# Patient Record
Sex: Male | Born: 1935 | Race: White | Hispanic: No | Marital: Married | Smoking: Never smoker
Health system: Southern US, Community
[De-identification: ages and names within clinical notes are randomized; demographics above are authoritative.]

## PROBLEM LIST (undated history)

## (undated) DIAGNOSIS — C61 Malignant neoplasm of prostate: Secondary | ICD-10-CM

## (undated) DIAGNOSIS — K5790 Diverticulosis of intestine, part unspecified, without perforation or abscess without bleeding: Secondary | ICD-10-CM

## (undated) DIAGNOSIS — R569 Unspecified convulsions: Secondary | ICD-10-CM

## (undated) DIAGNOSIS — K635 Polyp of colon: Secondary | ICD-10-CM

## (undated) HISTORY — PX: KNEE SURGERY: SHX244

## (undated) HISTORY — DX: Malignant neoplasm of prostate: C61

## (undated) HISTORY — DX: Polyp of colon: K63.5

## (undated) HISTORY — DX: Unspecified convulsions: R56.9

## (undated) HISTORY — PX: INGUINAL HERNIA REPAIR: SUR1180

## (undated) HISTORY — DX: Diverticulosis of intestine, part unspecified, without perforation or abscess without bleeding: K57.90

## (undated) HISTORY — PX: PROSTATE BIOPSY: SHX241

## (undated) HISTORY — PX: OTHER SURGICAL HISTORY: SHX169

---

## 2001-11-28 ENCOUNTER — Ambulatory Visit (HOSPITAL_COMMUNITY): Admission: RE | Admit: 2001-11-28 | Discharge: 2001-11-28 | Payer: Self-pay | Admitting: Internal Medicine

## 2002-02-03 ENCOUNTER — Encounter: Payer: Self-pay | Admitting: Gastroenterology

## 2002-02-06 ENCOUNTER — Encounter: Payer: Self-pay | Admitting: Gastroenterology

## 2004-07-07 ENCOUNTER — Emergency Department (HOSPITAL_COMMUNITY): Admission: EM | Admit: 2004-07-07 | Discharge: 2004-07-07 | Payer: Self-pay | Admitting: Family Medicine

## 2004-07-14 ENCOUNTER — Emergency Department (HOSPITAL_COMMUNITY): Admission: EM | Admit: 2004-07-14 | Discharge: 2004-07-14 | Payer: Self-pay | Admitting: Family Medicine

## 2007-12-30 ENCOUNTER — Ambulatory Visit (HOSPITAL_COMMUNITY): Admission: RE | Admit: 2007-12-30 | Discharge: 2007-12-30 | Payer: Self-pay | Admitting: Ophthalmology

## 2008-12-27 ENCOUNTER — Encounter (INDEPENDENT_AMBULATORY_CARE_PROVIDER_SITE_OTHER): Payer: Self-pay | Admitting: *Deleted

## 2009-01-07 ENCOUNTER — Encounter: Admission: RE | Admit: 2009-01-07 | Discharge: 2009-01-07 | Payer: Self-pay | Admitting: Internal Medicine

## 2009-07-14 ENCOUNTER — Telehealth: Payer: Self-pay | Admitting: Gastroenterology

## 2010-06-05 ENCOUNTER — Telehealth: Payer: Self-pay | Admitting: Gastroenterology

## 2010-06-06 ENCOUNTER — Ambulatory Visit
Admission: RE | Admit: 2010-06-06 | Discharge: 2010-06-06 | Payer: Self-pay | Source: Home / Self Care | Attending: Gastroenterology | Admitting: Gastroenterology

## 2010-06-06 ENCOUNTER — Encounter: Payer: Self-pay | Admitting: Gastroenterology

## 2010-06-06 ENCOUNTER — Other Ambulatory Visit: Payer: Self-pay | Admitting: Physician Assistant

## 2010-06-06 LAB — CBC WITH DIFFERENTIAL/PLATELET
Basophils Absolute: 0.1 10*3/uL (ref 0.0–0.1)
Basophils Relative: 0.9 % (ref 0.0–3.0)
Eosinophils Absolute: 0.2 10*3/uL (ref 0.0–0.7)
Hemoglobin: 13.6 g/dL (ref 13.0–17.0)
Lymphocytes Relative: 33.8 % (ref 12.0–46.0)
MCHC: 34.2 g/dL (ref 30.0–36.0)
Monocytes Relative: 14.6 % — ABNORMAL HIGH (ref 3.0–12.0)
Neutro Abs: 2.8 10*3/uL (ref 1.4–7.7)
Neutrophils Relative %: 48.1 % (ref 43.0–77.0)
RBC: 4.11 Mil/uL — ABNORMAL LOW (ref 4.22–5.81)
RDW: 12.9 % (ref 11.5–14.6)

## 2010-06-06 LAB — IBC PANEL
Saturation Ratios: 41.6 % (ref 20.0–50.0)
Transferrin: 245.3 mg/dL (ref 212.0–360.0)

## 2010-06-06 LAB — IRON: Iron: 143 ug/dL (ref 42–165)

## 2010-06-07 ENCOUNTER — Ambulatory Visit
Admission: RE | Admit: 2010-06-07 | Discharge: 2010-06-07 | Payer: Self-pay | Source: Home / Self Care | Attending: Gastroenterology | Admitting: Gastroenterology

## 2010-06-07 ENCOUNTER — Encounter: Payer: Self-pay | Admitting: Gastroenterology

## 2010-06-15 NOTE — Letter (Signed)
Summary: [facility] Instructions  [facility]  [address] [place], [place] [address]   Phone: [phone]  Fax: [phone]       LEONCIO HANSEN    December 23, 1930    MRN: [id number]        Procedure Day /Date: 06-07-2010      Arrival Time: 7:30 AM      Procedure Time: 8:00 AM     Location of Procedure:                    X     [facility] (4th Floor)   PREPARATION FOR COLONOSCOPY WITH MOVIPREP     THE DAY BEFORE YOUR PROCEDURE         DATE: 06-06-2010   DAY: Tuesday  1.  Drink clear liquids the entire day-NO SOLID FOOD  2.  Do not drink anything colored red or purple.  Avoid juices with pulp.  No orange juice.  3.  Drink at least 64 oz. (8 glasses) of fluid/clear liquids during the day to prevent dehydration and help the prep work efficiently.  CLEAR LIQUIDS INCLUDE: Water Jello Ice Popsicles Tea (sugar ok, no milk/cream) Powdered fruit flavored drinks Coffee (sugar ok, no milk/cream) Gatorade Juice: apple, white grape, white cranberry  Lemonade Clear bullion, consomm, broth Carbonated beverages (any kind) Strained chicken noodle soup Hard Candy                             4.  In the morning, mix first dose of MoviPrep solution:    Empty 1 Pouch A and 1 Pouch B into the disposable container    Add lukewarm drinking water to the top line of the container. Mix to dissolve    Refrigerate (mixed solution should be used within 24 hrs)  5.  Begin drinking the prep at 5:00 p.m. The MoviPrep container is divided by 4 marks.   Every 15 minutes drink the solution down to the next mark (approximately 8 oz) until the full liter is complete.   6.  Follow completed prep with 16 oz of clear liquid of your choice (Nothing red or purple).  Continue to drink clear liquids until bedtime.  7.  Before going to bed, mix second dose of MoviPrep solution:    Empty 1 Pouch A and 1 Pouch B into the disposable container    Add lukewarm drinking water  to the top line of the container. Mix to dissolve    Refrigerate  THE DAY OF YOUR PROCEDURE      DATE: 06-07-2010  DAY: Wednesday  Beginning at 3:00 AM  (5 hours before procedure):         1. Every 15 minutes, drink the solution down to the next mark (approx 8 oz) until the full liter is complete.  2. Follow completed prep with 16 oz. of clear liquid of your choice.    3. You may drink clear liquids until 6:00 AM  (2 HOURS BEFORE PROCEDURE).   MEDICATION INSTRUCTIONS  Unless otherwise instructed, you should take regular prescription medications with a small sip of water   as early as possible the morning of your procedure.  Do not take the Aspirin 81 mg tomorrow 06-07-2010.          OTHER INSTRUCTIONS  You will need a responsible adult at least 75 years of age to accompany you and drive you home.   This person must  remain in the waiting room during your procedure.  Wear loose fitting clothing that is easily removed.  Leave jewelry and other valuables at home.  However, you may wish to bring a book to read or  an iPod/MP3 player to listen to music as you wait for your procedure to start.  Remove all body piercing jewelry and leave at home.  Total time from sign-in until discharge is approximately 2-3 hours.  You should go home directly after your procedure and rest.  You can resume normal activities the  day after your procedure.  The day of your procedure you should not:   Drive   Make legal decisions   Operate machinery   Drink alcohol   Return to work  You will receive specific instructions about eating, activities and medications before you leave.    The above instructions have been reviewed and explained to me by   _______________________    I fully understand and can verbalize these instructions _____________________________ Date _________

## 2010-06-15 NOTE — Progress Notes (Signed)
Summary: Schedule Colonoscopy  Phone Note Outgoing Call Call back at [facility] Phone [phone]   Call placed by: Harlow Mares CMA Duncan Dull),  July 14, 2009 2:38 PM Call placed to: Patient Summary of Call: I spoke to the patients wife and explained to her how important it is for her husband to have his colonoscopy. she will have him call back to schedule.  Initial call taken by: Harlow Mares CMA (AAMA),  July 14, 2009 2:50 PM

## 2010-06-15 NOTE — Progress Notes (Signed)
Summary: triage sooner appt  Phone Note From Other Clinic Call back at [phone]   Caller: Lew Dawes from Dr Tori Milks Call For: Dr Arlyce Dice Reason for Call: Schedule Patient Appt Summary of Call: Dr Neva Seat wants this patient seen for rectal bleeding can't wait until first available is 2-28 Initial call taken by: Tawni Levy,  June 05, 2010 2:45 PM  Follow-up for Phone Call        Spoke with Lew Dawes and scheduled patient to see Mike Gip, PA 06/06/10 @10am .  Follow-up by: Selinda Michaels RN,  June 05, 2010 3:05 PM

## 2010-06-15 NOTE — Procedures (Signed)
Summary: Colonoscopy  Patient: Kenneth Bowen Note: All result statuses are Final unless otherwise noted.  Tests: (1) Colonoscopy (COL)   COL Colonoscopy           DONE     [facility]     [address]     [place], [place]  [address]           COLONOSCOPY PROCEDURE REPORT           PATIENT:  Kenneth Bowen, Kenneth Bowen  MR#:  [id number]     BIRTHDATE:  06-21-1930, 79 yrs. old  GENDER:  male           ENDOSCOPIST:  Barbette Hair. Arlyce Dice, MD     Referred by:  Nila Nephew, M.D.           PROCEDURE DATE:  06/07/2010     PROCEDURE:  Diagnostic Colonoscopy     ASA CLASS:  Class II     INDICATIONS:  1) rectal bleeding           MEDICATIONS:   Fentanyl 50 mcg IV, Versed 5 mg IV           DESCRIPTION OF PROCEDURE:   After the risks benefits and     alternatives of the procedure were thoroughly explained, informed     consent was obtained.  Digital rectal exam was performed and     revealed no abnormalities.   The LB [id number] endoscope     was introduced through the anus and advanced to the cecum, which     was identified by both the appendix and ileocecal valve, without     limitations.  The quality of the prep was good, using MoviPrep.     The instrument was then slowly withdrawn as the colon was fully     examined.     <<PROCEDUREIMAGES>>           FINDINGS:  Mild diverticulosis was found in the descending colon     (see image13).  No old or fresh blood.  This was otherwise a     normal examination of the colon (see image1, image2, image3,     image5, image6, image7, image10, image14, and image16).     Retroflexed views in the rectum revealed no abnormalities.    The     time to cecum =  4.0  minutes. The scope was then withdrawn (time     =  7.50  min) from the patient and the procedure completed.           COMPLICATIONS:  None           ENDOSCOPIC IMPRESSION:     1) Mild diverticulosis in the descending colon     2) Otherwise normal examination           Limited  rectal bleeding secondary to diverticular bleeding or     hemorrhoids.     No further bleeding           RECOMMENDATIONS:     1) Return to the care of your primary provider. GI follow up as     needed           REPEAT EXAM:  No           ______________________________     Barbette Hair. Arlyce Dice, MD           CC:           n.     eSIGNED:  Barbette Hair. Kaplan at 06/07/2010 08:34 AM           Page 2 of 3   [place], [place], [phone]  Note: An exclamation mark (!) indicates a result that was not dispersed into the flowsheet. Document Creation Date: 06/07/2010 8:34 AM _______________________________________________________________________  (1) Order result status: Final Collection or observation date-time: 06/07/2010 08:26 Requested date-time:  Receipt date-time:  Reported date-time:  Referring Physician:   Ordering Physician: Melvia Heaps [phone]) Specimen Source:  Source: Launa Grill Order Number: [id number] Lab site:

## 2010-06-15 NOTE — Assessment & Plan Note (Signed)
Summary: Rectal bleeding/LRH   History of Present Illness Primary GI MD: Sheryn Bison MD FACP FAGA Requesting Provider: Nila Nephew History of Present Illness:   VERY NICE 77 YO MALE KNOWN TO DR. KAPLAN FROM PRIOR COLONOSCOPY IN 2003. THIS SHOWED DESCENDING COLON DIVERICULOSIS, AND ONE SMALL POLYP (HYPERPLASTIC) PT COMES IN TODAY IN REFERRAL  FROM DR. ED GREEN, AFTER ONSET 5 DAYS AGO WITH PAINLESS RECTAL BLEEDING. HE HAS BEEN HAVING ONE BM/DAY WHICH APPEARS TO HAVE BLOOD IN IT AND COLORS THE WATER  RED. HE HAS NOT HAD ANY ABDOMINAL PAIN, CRAMPING. NO NAUSEA,ANOREXIA.NO WEAKNESS ,LIGHTHEADEDNESS ETC. NO RECTAL PAIN OR HX OF HEMORRHOID SXS.  THIS AM,AFTER HAVING A BM HE ALSO NOTICED SOME DRIPPING OF BLOOD IN THE COMMODE.  HE WAS SEEN BY DR. GREEN YESTERDAY, REPORTS HE HAD AN ANOSCOPY WITH BLOOD PRESENT, NO DEFINITE HEMORRHOIDS. HE ALSO RELATES HX OF IRON DEFICIENCY A YEAR OR SO AGO-TOOK IRON AND COUNTS IMPROVED. HE NOW TAKES IRON A COUPLE TIMES PER WEEK.HE IS ON A BABY ASA DAILY, NO OTHER BLOOD THINNERS.   GI Review of Systems    Reports weight loss.   Weight loss of 3 pounds   Denies abdominal pain, acid reflux, belching, bloating, chest pain, dysphagia with liquids, dysphagia with solids, heartburn, loss of appetite, nausea, vomiting, vomiting blood, and  weight gain.      Reports rectal bleeding.     Denies anal fissure, black tarry stools, change in bowel habit, constipation, diarrhea, diverticulosis, fecal incontinence, heme positive stool, hemorrhoids, irritable bowel syndrome, jaundice, light color stool, liver problems, and  rectal pain. Preventive Screening-Counseling & Management  Alcohol-Tobacco     Smoking Status: never    Current Medications (verified): 1)  Alprazolam 0.25 Mg Tabs (Alprazolam) .... 1/2 Tab Before Bedtime 2)  Allergy 4 Mg Tabs (Chlorpheniramine Maleate) .... Take 1 Daily 3)  Fish Oil 500 Mg Caps (Omega-3 Fatty Acids) .... Take 1 Tab Daily 4)   Multivitamins  Tabs (Multiple Vitamin) .... Take 1 Daily 5)  Vitamin C 500 Mg Chew (Ascorbic Acid) .... Take 1 Daily 6)  Vitamin D 400 Unit Caps (Cholecalciferol) .... Take 1 Daily 7)  Iron Supplement 325 (65 Fe) Mg Tabs (Ferrous Sulfate) .... Take  3 Every Week, Every Other Day 8)  Aspirin 81 Mg Tbec (Aspirin) .... Take 1 Tab Daily  Allergies (verified): No Known Drug Allergies   Past History:  Past Medical History: OSTEOARTHRITIS DIVERTICULOSIS HX /COLON POLYP 2003  Past Surgical History: Hernia surgery- 1993  Family History: Pancreatic cancer Maternal Aunt Stomach cancer- Maternal Grandfather Irratable Bowel Syndrome- Mother  Social History: Furniture conservator/restorer Occupation:  Patient has never smoked.  Alcohol Use - yes- 1 drink j sometimes daily Daily Caffeine Use- 2 cups of coffee Smoking Status:  never  Review of Systems       The patient complains of arthritis/joint pain.         SEE HPI  Vital Signs:  Patient profile:   77 year old male Height:      71 inches Weight:      189.25 pounds BMI:     26.49 Pulse rate:   60 / minute Pulse rhythm:   regular BP sitting:   114 / 62  (left arm)  Vitals Entered By: Lowry Ram NCMA (June 06, 2010 10:07 AM)  Physical Exam  General:  Well developed, well nourished, no acute distress. Head:  Normocephalic and atraumatic. Eyes:  PERRLA, no icterus. Lungs:  Clear throughout to auscultation. Heart:  Regular rate and rhythm; no murmurs, rubs,  or bruits. Abdomen:  SOFT, NONTENDER,NO MASS OR HSM,BS+ Rectal:  NOT REPEATED TODAY Extremities:  No clubbing, cyanosis, edema or deformities noted. Neurologic:  Alert and  oriented x4;  grossly normal neurologically. Psych:  Alert and cooperative. Normal mood and affect.   Impression & Recommendations:  Problem # 1:  RECTAL BLEEDING (ICD-569.3) Assessment New 77 YO MALE  WITH  5 DAY HX OF PAINLESS RECTAL BLEEDING /WITH BLOOD IN BM'S. HX OF DIVERTICULOSIS AND TINY  HYPERPLASTIC POLYP 2003. R/O LOW GRADE DIVERTICULAR BLEEDING, INTERNAL HEMORRHOIDAL,VS. OCCULT LESION.  CBC ,FE TODAY SCHEDULE FOR COLONOSCOPY WITH DR. KAPLAN ASAP-PROCEDURE REVIEWED IN DETAIL  WITH PT.  (SCHEDULED FOR TOMORROW) HOLD  BABY ASA. Orders: TLB-CBC Platelet - w/Differential (85025-CBCD) TLB-IBC Pnl (Iron/FE;Transferrin) (83550-IBC) TLB-Iron, (Fe) Total (83540-FE) Colonoscopy (Colon)  Problem # 2:  DIVERTICULOSIS OF COLON (ICD-562.10) Assessment: Comment Only  Orders: Colonoscopy (Colon)  Problem # 3:  PERSONAL HISTORY OF COLONIC POLYPS (ICD-V12.72) Assessment: Comment Only HYPERPLASTIC 2003 Orders: Colonoscopy (Colon)  Problem # 4:  ANEMIA Assessment: Unchanged PT WITH HX OF IRON DEFICIENCY ,ETIOLOGY UNCLEAR  WILL CHECK FE STUDIES TODAY  Patient Instructions: 1)  Please go to lab, basement level. 2)  We scheduled the Colonoscopy with Dr. Arlyce Dice on Wed 06-07-2010. 3)  Directions and brochure provided. 4)  [facility] Patient Information Guide given to patient. 5)  We sent the perscription for the colonoscopy prep you will be drinking to [facility] [address] 6)  Copy sent to : Dr Nila Nephew 7)  The medication list was reviewed and reconciled.  All changed / newly prescribed medications were explained.  A complete medication list was provided to the patient / caregiver. Prescriptions: MOVIPREP 100 GM  SOLR (PEG-KCL-NACL-NASULF-NA ASC-C) As per prep instructions.  #1 x 0   Entered by:   Lowry Ram NCMA   Authorized by:   Sammuel Cooper PA-c   Signed by:   Lowry Ram NCMA on 06/06/2010   Method used:   Electronically to        [facility] [phone][facility] (retail)       [address]       [place], [place]  [address]       Ph: [phone] or [phone]       Fax: [phone]   RxID:   [id number]

## 2010-06-15 NOTE — Procedures (Signed)
Summary: [facility] COLON   Colonoscopy  Procedure date:  02/03/2002  Findings:      Location:  [facility].   Patient Name: Kenneth Bowen, Scheffler MRN:  Procedure Procedures: Colonoscopy CPT: [phone].    with Hot Biopsy(s)CPT: [id number].  Personnel: Endoscopist: Barbette Hair. Arlyce Dice, MD.  Referred By: Erskine Speed, MD.  Indications  Surveillance of: Adenomatous Polyp(s).  Comments: Polyp seen on screening sigmoidoscopy History  Pre-Exam Physical: Performed Feb 06, 2002. Entire physical exam was normal.  Exam Exam: Extent of exam reached: Cecum, extent intended: Cecum.  The cecum was identified by IC valve. Colon retroflexion performed. ASA Classification: II. Tolerance: good.  Monitoring: Pulse and BP monitoring, Oximetry used. Supplemental O2 given. at 2 Liters.  Colon Prep Used Golytely for colon prep. Prep results: excellent.  Sedation Meds: Fentanyl 100 mcg. given IV. Versed 5 mg. given IV.  Findings - DIVERTICULOSIS: Descending Colon to Sigmoid Colon. ICD9: Diverticulosis: 562.10.  POLYP: Sigmoid Colon, Maximum size: 2 mm. Distance from Anus 25 cm. ICD9: Colon Polyps: 211.3.   Assessment Abnormal examination, see findings above.  Diagnoses: 211.3: Colon Polyps.  562.10: Diverticulosis.   Events  Unplanned Interventions: No intervention was required.  Unplanned Events: There were no complications. Plans Patient Education: Patient given standard instructions for: Polyps. Diverticulosis.  Scheduling/Referral: Colonoscopy, to Barbette Hair. Arlyce Dice, MD, If adenoma, around Feb 06, 2005.    cc: Erskine Speed, MD  This report was created from the original endoscopy report, which was reviewed and signed by the above listed endoscopist.

## 2010-09-26 NOTE — Op Note (Signed)
Kenneth Bowen, REIHL             ACCOUNT NO.:  [id number]   MEDICAL RECORD NO.:  [id number]          PATIENT TYPE:  AMB   LOCATION:  SDS                          FACILITY:  [facility]   PHYSICIAN:  Robert L. Dione Booze, M.D.  DATE OF BIRTH:  15-Oct-1930   DATE OF PROCEDURE:  DATE OF DISCHARGE:  12/30/2007                               OPERATIVE REPORT   INDICATIONS AND JUSTIFICATION FOR THE PROCEDURE:  Mr. Kutch is  followed regularly by Dr. Elise Benne and was seen on December 02, 2007,  regarding the possibility of upper eyelid blepharoplasties.  Basically,  he has extremely redundant skin of each upper eyelid and his acuity is  20/25.  There is an enormous reduction in the superior field, but the  skin is taped compared to when it is not taped and photographs confirmed  the condition.  The rest of this examination is fairly unremarkable.  The procedure was discussed with him regarding the difference in ptosis  and dermatochalasis and eyebrow ptosis.  He understands that removing  the skin from each upper eyelid would not necessarily correct all of  these problems, but it is hoped that this would open up things for him  and should be fairly simple to accomplish.  Various risks were  discussed.   Justification for performed procedure in outpatient setting is to have  changed if justification was not sustained, none.   PREOPERATIVE DIAGNOSIS:  Severe dermatochalasis with visual impairment.   POSTOPERATIVE DIAGNOSIS:  Severe dermatochalasis with visual impairment.   OPERATION PERFORMED:  Upper eyelid blepharoplasty.   SURGEON:  Robert L. Groat, MD   ANESTHESIA:  Xylocaine 1% with epinephrine.   PROCEDURE:  The patient arrived to the minor surgery room and was  prepped and draped in the routine fashion.  The skin to be removed was  carefully demarcated and 1% Xylocaine with epinephrine was given as a  local anesthetic.  The skin was then carefully excised along with some  underlying  subcutaneous tissue, and each wound was closed with a running  6-0 nylon suture.  Cold compresses were applied, and the patient left  the operating room having done very nicely.   FOLLOWUP CARE:  The patient to be seen in my office in about 6 days to  have the sutures removed.  He is to keep his head elevated today and use  cold compresses and will start warm compresses tomorrow.  He is to use  Polysporin ointment once daily.           ______________________________  Doris Cheadle Dione Booze, M.D.     RLG/MEDQ  D:  12/30/2007  T:  12/31/2007  Job:  [id number]   cc:   Erskine Speed, M.D.  Vincenza Hews, M.D.

## 2010-10-05 ENCOUNTER — Other Ambulatory Visit: Payer: Self-pay | Admitting: Internal Medicine

## 2010-10-05 ENCOUNTER — Ambulatory Visit
Admission: RE | Admit: 2010-10-05 | Discharge: 2010-10-05 | Disposition: A | Discharge status: Home / Self Care | Payer: Medicare Other | Source: Ambulatory Visit | Attending: Internal Medicine | Admitting: Internal Medicine

## 2010-10-05 DIAGNOSIS — R05 Cough: Secondary | ICD-10-CM

## 2011-10-16 ENCOUNTER — Ambulatory Visit (HOSPITAL_COMMUNITY)
Admission: RE | Admit: 2011-10-16 | Discharge: 2011-10-16 | Disposition: A | Discharge status: Home / Self Care | Payer: Medicare Other | Source: Ambulatory Visit | Attending: Internal Medicine | Admitting: Internal Medicine

## 2011-10-16 DIAGNOSIS — H538 Other visual disturbances: Secondary | ICD-10-CM | POA: Insufficient documentation

## 2011-10-16 DIAGNOSIS — R4789 Other speech disturbances: Secondary | ICD-10-CM | POA: Insufficient documentation

## 2011-10-16 DIAGNOSIS — G459 Transient cerebral ischemic attack, unspecified: Secondary | ICD-10-CM | POA: Insufficient documentation

## 2011-10-16 DIAGNOSIS — I6350 Cerebral infarction due to unspecified occlusion or stenosis of unspecified cerebral artery: Secondary | ICD-10-CM

## 2011-10-16 NOTE — Progress Notes (Signed)
*  PRELIMINARY RESULTS* Vascular Ultrasound Carotid Duplex (Doppler) has been completed.  No evidence of internal carotid artery stenosis bilaterally. Bilateral antegrade vertebral artery flow.  Malachy Moan, RDMS, RDCS 10/16/2011, 9:18 AM

## 2011-10-25 ENCOUNTER — Other Ambulatory Visit: Payer: Self-pay | Admitting: Dermatology

## 2012-07-18 ENCOUNTER — Other Ambulatory Visit (HOSPITAL_COMMUNITY): Payer: Self-pay | Admitting: Internal Medicine

## 2012-07-23 ENCOUNTER — Ambulatory Visit (HOSPITAL_COMMUNITY)
Admission: RE | Admit: 2012-07-23 | Discharge: 2012-07-23 | Disposition: A | Discharge status: Home / Self Care | Payer: Medicare Other | Source: Ambulatory Visit | Attending: Internal Medicine | Admitting: Internal Medicine

## 2012-07-23 DIAGNOSIS — R059 Cough, unspecified: Secondary | ICD-10-CM | POA: Insufficient documentation

## 2012-07-23 DIAGNOSIS — Z79899 Other long term (current) drug therapy: Secondary | ICD-10-CM | POA: Insufficient documentation

## 2012-07-23 DIAGNOSIS — R12 Heartburn: Secondary | ICD-10-CM | POA: Insufficient documentation

## 2012-07-23 DIAGNOSIS — M5380 Other specified dorsopathies, site unspecified: Secondary | ICD-10-CM | POA: Insufficient documentation

## 2012-07-23 NOTE — Procedures (Signed)
Objective Swallowing Evaluation: Modified Barium Swallowing Study  Patient Details  Name: Kenneth Bowen MRN: [id number] Date of Birth: 08-Apr-1931  Today's Date: 07/23/2012 Time: 4098-1191 SLP Time Calculation (min): 39 min  Past Medical History: No past medical history on file. Past Surgical History: No past surgical history on file. HPI:  79 yo male referred by Dr Leo Grosser staff for MBS due to pt having cough - associated with talking, turning his head, and sometimes when eating- followed by sneezing.   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  Pt admits to occasional heartburn that is normally food dependent for which pt takes Tums.   Pt denies significant neurological diagnosis, unintentional weight loss, recurrent pulmonary infections nor ever requiring heimlich manuever.  Allegra is taken by pt for allergies x 1-2 years per his statement and he reports problems with hoarse voice and frequent baseline throat clearing.  Pt reported he desired a refill of tessalon for cough suppression but his MD desired for MBS to be completed to assure cough was not aspiration related.      Assessment / Plan / Recommendation Clinical Impression  Dysphagia Diagnosis: Within Functional Limits  Clinical impression: Pt presents with functional oropharyngeal swallow ability without aspiration or penetration of any consistency tested.  Even when pt was taxed to drink liquids sequentially- 5 ounces, his airway protection was adequate.  Piecemeal deglutition of liquids occasionally noted and was functional.  Based on MBS findings, cough does not appear to be due to po aspiration.    Please note, pt had mild baseline throat clearing today prior to barium po- but not during or after testing and he did not cough during entire SLP visit.    Appearance of cervical osteophytes near C5-C6 and C6-C7 impinged  slightly into pharynx/cervical esophagus but did not impair flow of solids or liquids during testing.  Barium tablet taken with thin appeared to lodge at mid-esophagus without pt awareness (? at aortic arch?).   Further liquid swallows did not transit tablet (moved around tablet) but applesauce bolus readily pushed tablet into stomach without delay.  Pt did not sense barium tablet lodging but was educated to findings/recommendations during entire procedure.  Radiologist not present during MBS to confirm finding.     Treatment Recommendation    n/a   Diet Recommendation Regular;Thin liquid   Liquid Administration via: Cup;Straw Medication Administration: Whole meds with liquid (with plenty of liquids) Supervision: Patient able to self feed Compensations: Slow rate;Small sips/bites Postural Changes and/or Swallow Maneuvers: Seated upright 90 degrees;Upright 30-60 min after meal    Other  Recommendations Oral Care Recommendations: Oral care BID   Follow Up Recommendations  None           SLP Swallow Goals     General Date of Onset: 07/23/12 HPI: 79 yo male referred by Dr Leo Grosser staff for MBS due to pt having cough - associated with talking, turning his head, and sometimes when eating- followed by sneezing.   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  Pt admits to occasional heartburn that is normally food dependent and pt takes Tums.   Pt denies significant neurological diagnosis, unintentional weight loss, recurrent pulmonary infections nor ever requiring heimlich manuever.  Allegra is taken by pt for allergies x 1-2 years per  his statement and he reports problems with hoarse voice and frequent baseline throat clearing.  Pt reported he desired a refill of tessalon for cough suppression but his MD desired for MBS to be completed to assure cough was not aspiration related.  Type of  Study: Modified Barium Swallowing Study Reason for Referral: Objectively evaluate swallowing function Previous Swallow Assessment: none Diet Prior to this Study: Regular;Thin liquids Respiratory Status: Room air History of Recent Intubation: No Behavior/Cognition: Alert;Cooperative;Pleasant mood Oral Cavity - Dentition: Adequate natural dentition Oral Motor / Sensory Function: Within functional limits Self-Feeding Abilities: Able to feed self Patient Positioning: Upright in chair Baseline Vocal Quality: Clear Volitional Cough: Strong Volitional Swallow: Able to elicit Anatomy: Within functional limits (appearance of osteophytes at C5-C6, C6-C7) Pharyngeal Secretions: Not observed secondary MBS    Reason for Referral Objectively evaluate swallowing function   Oral Phase Oral Preparation/Oral Phase Oral Phase: WFL Oral Phase - Comment Oral Phase - Comment: piecemeal deglutition noted with liquids - functional for pt   Pharyngeal Phase Pharyngeal Phase Pharyngeal Phase: Within functional limits  Cervical Esophageal Phase    GO    Cervical Esophageal Phase Cervical Esophageal Phase: Impaired Cervical Esophageal Phase - Comment Cervical Esophageal Comment: Appearance of cervical osteophytes near C5-C6 and C6-C7 impinged slightly into pharynx/cervical esophagus but did not impair flow of solids or liquids during testing.  Barium tablet taken with thin appeared to lodge at mid-esophagus without pt awareness (? at aortic arch?).   Further liquid swallows did not transit tablet (moved around tablet) but applesauce bolus readily pushed tablet into stomach without delay.  Pt did not sense barium tablet lodging, radiologist not present during MBS to confirm finding.     Functional Assessment Tool Used: MBS, clinical judgement Functional Limitations: Swallowing Swallow Current Status ([id number]): 0 percent impaired, limited or restricted Swallow Goal Status ([id number]): 0 percent impaired,  limited or restricted Swallow Discharge Status [phone]): 0 percent impaired, limited or restricted    Donavan Burnet, MS [facility] SLP [phone]

## 2013-05-14 DIAGNOSIS — C61 Malignant neoplasm of prostate: Secondary | ICD-10-CM

## 2013-05-14 HISTORY — DX: Malignant neoplasm of prostate: C61

## 2013-10-30 ENCOUNTER — Other Ambulatory Visit: Payer: Self-pay | Admitting: Internal Medicine

## 2013-10-30 ENCOUNTER — Ambulatory Visit
Admission: RE | Admit: 2013-10-30 | Discharge: 2013-10-30 | Disposition: A | Discharge status: Home / Self Care | Payer: Medicare Other | Source: Ambulatory Visit | Attending: Internal Medicine | Admitting: Internal Medicine

## 2013-10-30 DIAGNOSIS — R05 Cough: Secondary | ICD-10-CM

## 2013-10-30 DIAGNOSIS — R053 Chronic cough: Secondary | ICD-10-CM

## 2013-12-30 ENCOUNTER — Encounter: Payer: Self-pay | Admitting: Radiation Oncology

## 2013-12-30 DIAGNOSIS — C61 Malignant neoplasm of prostate: Secondary | ICD-10-CM

## 2013-12-30 NOTE — Progress Notes (Signed)
GU Location of Tumor / Histology: prostatic adenocarcinoma  If Prostate Cancer, Gleason Score is (3 + 4) and PSA is (6.38)  Kenneth Bowen was referred to Dr. Janice Norrie by Dr. Rolanda Jay for moderately elevated PSA.  Biopsies of prostate (if applicable) revealed:     Past/Anticipated interventions by urology, if any: not a candidate for prostatectomy due to age  Past/Anticipated interventions by medical oncology, if any: no  Weight changes, if any: no  Bowel/Bladder complaints, if any: yes, hesitancy, decreased stream, urgency, nocturia   Nausea/Vomiting, if any: no  Pain issues, if any:  no  SAFETY ISSUES:  Prior radiation? no  Pacemaker/ICD? no  Possible current pregnancy? no  Is the patient on methotrexate? no  Current Complaints / other details:  80 year old male. NKDA. Married. Prostate volume 40.83. Not a candidate for prostatectomy due to age. Patient interest in radiation therapy.

## 2013-12-31 ENCOUNTER — Ambulatory Visit
Admission: RE | Admit: 2013-12-31 | Discharge: 2013-12-31 | Disposition: A | Discharge status: Home / Self Care | Payer: Medicare Other | Source: Ambulatory Visit | Attending: Radiation Oncology | Admitting: Radiation Oncology

## 2013-12-31 ENCOUNTER — Encounter: Payer: Self-pay | Admitting: Radiation Oncology

## 2013-12-31 VITALS — BP 130/67 | HR 64 | Temp 97.7°F | Wt 182.4 lb

## 2013-12-31 DIAGNOSIS — Z510 Encounter for antineoplastic radiation therapy: Secondary | ICD-10-CM | POA: Insufficient documentation

## 2013-12-31 DIAGNOSIS — C61 Malignant neoplasm of prostate: Secondary | ICD-10-CM | POA: Diagnosis not present

## 2013-12-31 DIAGNOSIS — Z7982 Long term (current) use of aspirin: Secondary | ICD-10-CM | POA: Insufficient documentation

## 2013-12-31 DIAGNOSIS — IMO0002 Reserved for concepts with insufficient information to code with codable children: Secondary | ICD-10-CM | POA: Diagnosis not present

## 2013-12-31 NOTE — Progress Notes (Signed)
Kenneth Bowen here for assessment for prostaed cancer.  He reports that he has no discomfort when urinating, and has a slow stream.  When voiding,at times, he has a stop and start pattern because, once he voids he can immediately have an urge to urinate again.

## 2013-12-31 NOTE — Progress Notes (Signed)
Radiation Oncology         [phone]) [phone] ________________________________  Initial outpatient Consultation  Name: Kenneth Bowen MRN: [id number]  Date: 12/31/2013  DOB: 08/26/1930  BJ:YNWGN, [name], MD  Janice Norrie Charlene Brooke, MD   REFERRING PHYSICIAN: Arvil Persons, MD  DIAGNOSIS: 80 y.o. gentleman with stage T2a adenocarcinoma of the prostate with a Gleason's score of 3+4 and a PSA of 6.38an with stage T2a adenocarcinoma of the prostate with a Gleason's score of 3+4 and a PSA of 6.38  HISTORY OF PRESENT ILLNESS::[name] is a 80 y.o. gentleman.  He was noted to have an elevated rising PSA of 6.38 by his primary care physician, Dr. Levin Erp.  Accordingly, he was referred for evaluation in urology by Dr. Janice Norrie on 11/24/13,  digital rectal examination was performed at that time revealing a palpable nodule involving the right base, confined within the capsule.  The patient proceeded to transrectal ultrasound with 12 biopsies of the prostate on 12/10/13.  The prostate volume measured 40.83 cc.  Out of 12 core biopsies, 5 were positive.  The maximum Gleason score was 3+4, and this was seen in the distribution displayed in the figure below:   The patient reviewed the biopsy results with his urologist and he has kindly been referred today for discussion of potential radiation treatment options.  PREVIOUS RADIATION THERAPY: No  PAST MEDICAL HISTORY:  has a past medical history of Prostate cancer; Diverticulosis; and Colon polyps.    PAST SURGICAL HISTORY: Past Surgical History  Procedure Laterality Date  . Inguinal hernia repair    . Knee surgery    . Prostate biopsy      FAMILY HISTORY: family history includes Alcoholism in his father. There is no history of Cancer.  SOCIAL HISTORY:  reports that he has never smoked. He has never used smokeless tobacco. He reports that he drinks alcohol. He reports that he does not use illicit drugs.  ALLERGIES: Review of patient's allergies indicates no known allergies.  MEDICATIONS:  Current Outpatient Prescriptions  Medication Sig  Dispense Refill  . ALPRAZolam (XANAX) 1 MG tablet Take 1 mg by mouth at bedtime as needed for anxiety.      Marland Kitchen aspirin 81 MG tablet Take 81 mg by mouth daily.      . benzonatate (TESSALON) 200 MG capsule Take 200 mg by mouth 3 (three) times daily as needed for cough.      . cetirizine (ZYRTEC) 10 MG tablet Take 10 mg by mouth daily.      . cholecalciferol (VITAMIN D) 1000 UNITS tablet Take 1,000 Units by mouth daily.      . Multiple Vitamin (MULTIVITAMIN) tablet Take 1 tablet by mouth daily.      . predniSONE (STERAPRED UNI-PAK) 10 MG tablet Take by mouth daily.       No current facility-administered medications for this encounter.    REVIEW OF SYSTEMS:  A 15 point review of systems is documented in the electronic medical record. This was obtained by the nursing staff. However, I reviewed this with the patient to discuss relevant findings and make appropriate changes.  A comprehensive review of systems was negative..  The patient completed an IPSS and IIEF questionnaire.  His IPSS score was 11 indicating moderate urinary outflow obstructive symptoms.  He indicated that his erectile function is not a major factor in his decision making.   PHYSICAL EXAM: This patient is in no acute distress.  He is alert and oriented.   weight is 182 lb 6.4 oz (82.736 kg). His temperature is 97.7 F (36.5 C). His blood pressure  is 130/67 and his pulse is 64. His oxygen saturation is 97%.  He exhibits no respiratory distress or labored breathing.  He appears neurologically intact.  His mood is pleasant.  His affect is appropriate.  Please note the digital rectal exam findings described above.  KPS = 100  100 - Normal; no complaints; no evidence of disease. 90   - Able to carry on normal activity; minor signs or symptoms of disease. 80   - Normal activity with effort; some signs or symptoms of disease. 45   - Cares for self; unable to carry on normal activity or to do active work. 60   - Requires occasional  assistance, but is able to care for most of his personal needs. 50   - Requires considerable assistance and frequent medical care. 22   - Disabled; requires special care and assistance. 42   - Severely disabled; hospital admission is indicated although death not imminent. 56   - Very sick; hospital admission necessary; active supportive treatment necessary. 10   - Moribund; fatal processes progressing rapidly. 0     - Dead  Karnofsky DA, Abelmann [place], Craver LS and Burchenal [facility] [phone]) The use of the nitrogen mustards in the palliative treatment of carcinoma: with particular reference to bronchogenic carcinoma Cancer 1 634-56   LABORATORY DATA:  Lab Results  Component Value Date   WBC 5.9 06/06/2010   HGB 13.6 06/06/2010   HCT 39.7 06/06/2010   MCV 96.7 06/06/2010   PLT 259.0 06/06/2010   No results found for this basename: NA,  K,  CL,  CO2   No results found for this basename: ALT,  AST,  GGT,  ALKPHOS,  BILITOT     RADIOGRAPHY: No results found.    IMPRESSION: This gentleman is an 80 y.o. gentleman with stage T2a adenocarcinoma of the prostate with a Gleason's score of 3+4 and a PSA of 6.38.  His T-Stage, Gleason's Score, and PSA put him into the intermediate risk group.  Accordingly he is eligible for a variety of potential treatment options including external radiotherapy.  PLAN:Today I reviewed the findings and workup thus far.  We discussed the natural history of prostate cancer.  We reviewed the the implications of T-stage, Gleason's Score, and PSA on decision-making and outcomes in prostate cancer.  We discussed radiation treatment in the management of prostate cancer with regard to the logistics and delivery of external beam radiation treatment as well as the logistics and delivery of prostate brachytherapy.  We compared and contrasted each of these approaches and also compared these against prostatectomy.  The patient expressed interest in external beam radiotherapy.  I filled out a  patient counseling form for him with relevant treatment diagrams and we retained a copy for our records.   The patient would like to proceed with prostate IMRT.  I will share my findings with Dr. Janice Norrie and move forward with scheduling placement of three gold fiducial markers into the prostate to proceed with IMRT in the near future.     I enjoyed meeting with him today, and will look forward to participating in the care of this very nice gentleman.   I spent 60 minutes face to face with the patient and more than 50% of that time was spent in counseling and/or coordination of care.   ------------------------------------------------  Sheral Apley. Tammi Klippel, M.D.

## 2014-01-04 ENCOUNTER — Telehealth: Payer: Self-pay | Admitting: *Deleted

## 2014-01-04 NOTE — Telephone Encounter (Signed)
Called patient to inform of gold seed placement on 02-04-14- arrival time - 2:30 pm @ Dr. Janice Norrie' Office and his sim on 02-12-14 @ 9 am @ Dr. Johny Shears Office, spoke with patient and he is aware of these appts.

## 2014-01-06 ENCOUNTER — Ambulatory Visit: Payer: Medicare Other | Admitting: Radiation Oncology

## 2014-01-06 ENCOUNTER — Ambulatory Visit: Payer: Medicare Other

## 2014-01-12 NOTE — Addendum Note (Signed)
Encounter addended by: Heywood Footman, RN on: 01/12/2014  9:37 AM<BR>     Documentation filed: Charges VN

## 2014-01-21 ENCOUNTER — Telehealth: Payer: Self-pay | Admitting: *Deleted

## 2014-01-21 NOTE — Telephone Encounter (Signed)
CALLED PATIENT TO REMIND OF 2 PM APPT FOR 01-22-14, SPOKE WITH PATIENT AND HE IS AWARE OF THIS APPT. (SEEDS WERE MOVED UP TO 01-20-14  @ DR. Janice Norrie' OFFICE)

## 2014-01-21 NOTE — Progress Notes (Signed)
  Radiation Oncology         [phone] ________________________________  Name: Kenneth Bowen MRN: [id number]  Date: 01/22/2014  DOB: 07-Feb-1931  SIMULATION AND TREATMENT PLANNING NOTE  DIAGNOSIS:  80 y.o. gentleman with stage T2a adenocarcinoma of the prostate with a Gleason's score of 3+4 and a PSA of 6.38  NARRATIVE:  The patient was brought to the [facility].  Identity was confirmed.  All relevant records and images related to the planned course of therapy were reviewed.  The patient freely provided informed written consent to proceed with treatment after reviewing the details related to the planned course of therapy. The consent form was witnessed and verified by the simulation staff.  Then, the patient was set-up in a stable reproducible supine position for radiation therapy.  A vacuum lock pillow device was custom fabricated to position his legs in a reproducible immobilized position.  Then, I performed a urethrogram under sterile conditions to identify the prostatic apex.  CT images were obtained.  Surface markings were placed.  The CT images were loaded into the planning software.  Then the prostate target and avoidance structures including the rectum, bladder, bowel and hips were contoured.  Treatment planning then occurred.  The radiation prescription was entered and confirmed.  A total of 1 complex treatment device was fabricated. I have requested : Intensity Modulated Radiotherapy (IMRT) is medically necessary for this case for the following reason:  Rectal sparing.Marland Kitchen  PLAN:  The patient will receive 78 Gy in 40 fractions.  ________________________________  Sheral Apley Tammi Klippel, M.D.

## 2014-01-22 ENCOUNTER — Ambulatory Visit
Admission: RE | Admit: 2014-01-22 | Discharge: 2014-01-22 | Disposition: A | Discharge status: Home / Self Care | Payer: Medicare Other | Source: Ambulatory Visit | Attending: Radiation Oncology | Admitting: Radiation Oncology

## 2014-01-22 DIAGNOSIS — Z510 Encounter for antineoplastic radiation therapy: Secondary | ICD-10-CM | POA: Diagnosis not present

## 2014-01-22 DIAGNOSIS — C61 Malignant neoplasm of prostate: Secondary | ICD-10-CM

## 2014-01-27 DIAGNOSIS — Z510 Encounter for antineoplastic radiation therapy: Secondary | ICD-10-CM | POA: Diagnosis not present

## 2014-02-01 DIAGNOSIS — Z510 Encounter for antineoplastic radiation therapy: Secondary | ICD-10-CM | POA: Diagnosis not present

## 2014-02-02 ENCOUNTER — Ambulatory Visit
Admission: RE | Admit: 2014-02-02 | Discharge: 2014-02-02 | Disposition: A | Discharge status: Home / Self Care | Payer: Medicare Other | Source: Ambulatory Visit | Attending: Radiation Oncology | Admitting: Radiation Oncology

## 2014-02-02 DIAGNOSIS — Z510 Encounter for antineoplastic radiation therapy: Secondary | ICD-10-CM | POA: Diagnosis not present

## 2014-02-03 ENCOUNTER — Ambulatory Visit
Admission: RE | Admit: 2014-02-03 | Discharge: 2014-02-03 | Disposition: A | Discharge status: Home / Self Care | Payer: Medicare Other | Source: Ambulatory Visit | Attending: Radiation Oncology | Admitting: Radiation Oncology

## 2014-02-03 DIAGNOSIS — Z510 Encounter for antineoplastic radiation therapy: Secondary | ICD-10-CM | POA: Diagnosis not present

## 2014-02-04 ENCOUNTER — Ambulatory Visit
Admission: RE | Admit: 2014-02-04 | Discharge: 2014-02-04 | Disposition: A | Discharge status: Home / Self Care | Payer: Medicare Other | Source: Ambulatory Visit | Attending: Radiation Oncology | Admitting: Radiation Oncology

## 2014-02-04 ENCOUNTER — Encounter: Payer: Self-pay | Admitting: Radiation Oncology

## 2014-02-04 DIAGNOSIS — Z510 Encounter for antineoplastic radiation therapy: Secondary | ICD-10-CM | POA: Diagnosis not present

## 2014-02-04 DIAGNOSIS — C61 Malignant neoplasm of prostate: Secondary | ICD-10-CM

## 2014-02-04 NOTE — Progress Notes (Signed)
  Radiation Oncology         [phone] ________________________________  Name: Kenneth Bowen MRN: [id number]  Date: 02/04/2014  DOB: 08/30/1930    Weekly Radiation Therapy Management    ICD-9-CM ICD-10-CM  1. Prostate cancer 185 C61    Current Dose: 5.85 Gy     Planned Dose:  78 Gy  Narrative . . . . . . . . The patient presents for routine under treatment assessment.                                   The patient is without complaint.                                 Set-up films were reviewed.                                 The chart was checked. Physical Findings. . . . Weight essentially stable.  No significant changes. Impression . . . . . . . The patient is tolerating radiation. Plan . . . . . . . . . . . . Continue treatment as planned.  ________________________________  Sheral Apley. Tammi Klippel, M.D.

## 2014-02-05 ENCOUNTER — Ambulatory Visit
Admission: RE | Admit: 2014-02-05 | Discharge: 2014-02-05 | Disposition: A | Discharge status: Home / Self Care | Payer: Medicare Other | Source: Ambulatory Visit | Attending: Radiation Oncology | Admitting: Radiation Oncology

## 2014-02-05 VITALS — Wt 182.8 lb

## 2014-02-05 DIAGNOSIS — C61 Malignant neoplasm of prostate: Secondary | ICD-10-CM

## 2014-02-05 DIAGNOSIS — Z510 Encounter for antineoplastic radiation therapy: Secondary | ICD-10-CM | POA: Diagnosis not present

## 2014-02-05 NOTE — Progress Notes (Signed)
Oriented patient to staff and routine of the clinic. Provided patient with RADIATION THERAPY AND YOU handbook then, reviewed pertinent information. Educated patient reference potential side effects and management such as, fatigue, skin changes, urinary/bladder changes and skin changes. Encouraged patient to contact staff with future needs. Patient verbalized understanding of all reviewed.

## 2014-02-08 ENCOUNTER — Ambulatory Visit
Admission: RE | Admit: 2014-02-08 | Discharge: 2014-02-08 | Disposition: A | Discharge status: Home / Self Care | Payer: Medicare Other | Source: Ambulatory Visit | Attending: Radiation Oncology | Admitting: Radiation Oncology

## 2014-02-08 DIAGNOSIS — Z510 Encounter for antineoplastic radiation therapy: Secondary | ICD-10-CM | POA: Diagnosis not present

## 2014-02-09 ENCOUNTER — Ambulatory Visit
Admission: RE | Admit: 2014-02-09 | Discharge: 2014-02-09 | Disposition: A | Discharge status: Home / Self Care | Payer: Medicare Other | Source: Ambulatory Visit | Attending: Radiation Oncology | Admitting: Radiation Oncology

## 2014-02-09 DIAGNOSIS — Z510 Encounter for antineoplastic radiation therapy: Secondary | ICD-10-CM | POA: Diagnosis not present

## 2014-02-10 ENCOUNTER — Ambulatory Visit
Admission: RE | Admit: 2014-02-10 | Discharge: 2014-02-10 | Disposition: A | Discharge status: Home / Self Care | Payer: Medicare Other | Source: Ambulatory Visit | Attending: Radiation Oncology | Admitting: Radiation Oncology

## 2014-02-10 DIAGNOSIS — Z510 Encounter for antineoplastic radiation therapy: Secondary | ICD-10-CM | POA: Diagnosis not present

## 2014-02-11 ENCOUNTER — Ambulatory Visit
Admission: RE | Admit: 2014-02-11 | Discharge: 2014-02-11 | Disposition: A | Discharge status: Home / Self Care | Payer: Medicare Other | Source: Ambulatory Visit | Attending: Radiation Oncology | Admitting: Radiation Oncology

## 2014-02-11 DIAGNOSIS — Z510 Encounter for antineoplastic radiation therapy: Secondary | ICD-10-CM | POA: Diagnosis not present

## 2014-02-11 DIAGNOSIS — C61 Malignant neoplasm of prostate: Secondary | ICD-10-CM | POA: Insufficient documentation

## 2014-02-12 ENCOUNTER — Other Ambulatory Visit: Payer: Self-pay | Admitting: Radiation Oncology

## 2014-02-12 ENCOUNTER — Ambulatory Visit
Admission: RE | Admit: 2014-02-12 | Discharge: 2014-02-12 | Disposition: A | Discharge status: Home / Self Care | Payer: Medicare Other | Source: Ambulatory Visit | Attending: Radiation Oncology | Admitting: Radiation Oncology

## 2014-02-12 ENCOUNTER — Encounter: Payer: Self-pay | Admitting: Radiation Oncology

## 2014-02-12 VITALS — BP 123/74 | HR 53 | Resp 16 | Wt 181.3 lb

## 2014-02-12 DIAGNOSIS — Z510 Encounter for antineoplastic radiation therapy: Secondary | ICD-10-CM | POA: Diagnosis not present

## 2014-02-12 DIAGNOSIS — C61 Malignant neoplasm of prostate: Secondary | ICD-10-CM

## 2014-02-12 NOTE — Progress Notes (Signed)
  Radiation Oncology         [phone] ________________________________   Name: Kenneth Bowen MRN: [id number]  Date: 02/12/2014  DOB: October 24, 1930  Weekly Radiation Therapy Management    ICD-9-CM ICD-10-CM  1. Prostate cancer 185 C61    Current Dose: 17.55 Gy     Planned Dose:  78 Gy  Narrative . . . . . . . . The patient presents for routine under treatment assessment.                                   Reports slight burning with urination intermittently. Reports weak urine stream. Reports intermittent hesitancy. Reports 2-3 loose stools per day. Denies pain. Weight stable. Vitals stable                                 Set-up films were reviewed.                                 The chart was checked. Physical Findings. . .  weight is 181 lb 4.8 oz (82.237 kg). His blood pressure is 123/74 and his pulse is 53. His respiration is 16. . Weight essentially stable.  No significant changes. Impression . . . . . . . The patient is tolerating radiation. Plan . . . . . . . . . . . . Continue treatment as planned.  ________________________________  Sheral Apley. Tammi Klippel, M.D.

## 2014-02-12 NOTE — Progress Notes (Signed)
Reports slight burning with urination intermittently. Reports weak urine stream. Reports intermittent hesitancy. Reports 2-3 loose stools per day. Denies pain. Weight stable. Vitals stable.

## 2014-02-15 ENCOUNTER — Ambulatory Visit
Admission: RE | Admit: 2014-02-15 | Discharge: 2014-02-15 | Disposition: A | Discharge status: Home / Self Care | Payer: Medicare Other | Source: Ambulatory Visit | Attending: Radiation Oncology | Admitting: Radiation Oncology

## 2014-02-15 DIAGNOSIS — Z510 Encounter for antineoplastic radiation therapy: Secondary | ICD-10-CM | POA: Diagnosis not present

## 2014-02-16 ENCOUNTER — Ambulatory Visit
Admission: RE | Admit: 2014-02-16 | Discharge: 2014-02-16 | Disposition: A | Discharge status: Home / Self Care | Payer: Medicare Other | Source: Ambulatory Visit | Attending: Radiation Oncology | Admitting: Radiation Oncology

## 2014-02-16 DIAGNOSIS — Z510 Encounter for antineoplastic radiation therapy: Secondary | ICD-10-CM | POA: Diagnosis not present

## 2014-02-17 ENCOUNTER — Ambulatory Visit
Admission: RE | Admit: 2014-02-17 | Discharge: 2014-02-17 | Disposition: A | Discharge status: Home / Self Care | Payer: Medicare Other | Source: Ambulatory Visit | Attending: Radiation Oncology | Admitting: Radiation Oncology

## 2014-02-17 ENCOUNTER — Encounter: Payer: Self-pay | Admitting: Radiation Oncology

## 2014-02-17 VITALS — BP 127/73 | HR 60 | Temp 97.5°F | Resp 16 | Wt 184.1 lb

## 2014-02-17 DIAGNOSIS — Z510 Encounter for antineoplastic radiation therapy: Secondary | ICD-10-CM | POA: Diagnosis not present

## 2014-02-17 DIAGNOSIS — C61 Malignant neoplasm of prostate: Secondary | ICD-10-CM

## 2014-02-17 NOTE — Progress Notes (Signed)
Reports his urine stream starts and stops. Reports he doesn't strain to void but, there is hesitancy. Reports mild intermittent dysuria. Reports when he voids he has to sit on the commode because he inevitably passes stool. Reports soft stools but, denies diarrhea. Reports nocturia x1. Reports increased frequency during the day. Explains he voids now every hour. Weight and vitals stable. Denies pain.

## 2014-02-17 NOTE — Progress Notes (Signed)
  Radiation Oncology         [phone] ________________________________   Name: Kenneth Bowen MRN: [id number]  Date: 02/17/2014  DOB: 04/20/1931  Weekly Radiation Therapy Management    ICD-9-CM ICD-10-CM  1. Prostate cancer 185 C61    Current Dose: 23.4 Gy     Planned Dose:  78 Gy  Narrative . . . . . . . . The patient presents for routine under treatment assessment.                                  Reports his urine stream starts and stops. Reports he doesn't strain to void but, there is hesitancy. Reports mild intermittent dysuria. Reports when he voids he has to sit on the commode because he inevitably passes stool. Reports soft stools but, denies diarrhea. Reports nocturia x1. Reports increased frequency during the day. Explains he voids now every hour. Weight and vitals stable. Denies pain                                 Set-up films were reviewed.                                 The chart was checked. Physical Findings. . .  vitals were not taken for this visit.. Weight essentially stable.  No significant changes. Impression . . . . . . . The patient is tolerating radiation. Plan . . . . . . . . . . . . Continue treatment as planned.  ________________________________  Sheral Apley. Tammi Klippel, M.D.

## 2014-02-18 ENCOUNTER — Ambulatory Visit
Admission: RE | Admit: 2014-02-18 | Discharge: 2014-02-18 | Disposition: A | Discharge status: Home / Self Care | Payer: Medicare Other | Source: Ambulatory Visit | Attending: Radiation Oncology | Admitting: Radiation Oncology

## 2014-02-18 DIAGNOSIS — Z510 Encounter for antineoplastic radiation therapy: Secondary | ICD-10-CM | POA: Diagnosis not present

## 2014-02-19 ENCOUNTER — Ambulatory Visit
Admission: RE | Admit: 2014-02-19 | Discharge: 2014-02-19 | Disposition: A | Discharge status: Home / Self Care | Payer: Medicare Other | Source: Ambulatory Visit | Attending: Radiation Oncology | Admitting: Radiation Oncology

## 2014-02-19 DIAGNOSIS — Z510 Encounter for antineoplastic radiation therapy: Secondary | ICD-10-CM | POA: Diagnosis not present

## 2014-02-22 ENCOUNTER — Ambulatory Visit
Admission: RE | Admit: 2014-02-22 | Discharge: 2014-02-22 | Disposition: A | Discharge status: Home / Self Care | Payer: Medicare Other | Source: Ambulatory Visit | Attending: Radiation Oncology | Admitting: Radiation Oncology

## 2014-02-22 DIAGNOSIS — Z510 Encounter for antineoplastic radiation therapy: Secondary | ICD-10-CM | POA: Diagnosis not present

## 2014-02-23 ENCOUNTER — Ambulatory Visit
Admission: RE | Admit: 2014-02-23 | Discharge: 2014-02-23 | Disposition: A | Discharge status: Home / Self Care | Payer: Medicare Other | Source: Ambulatory Visit | Attending: Radiation Oncology | Admitting: Radiation Oncology

## 2014-02-23 DIAGNOSIS — Z510 Encounter for antineoplastic radiation therapy: Secondary | ICD-10-CM | POA: Diagnosis not present

## 2014-02-24 ENCOUNTER — Ambulatory Visit
Admission: RE | Admit: 2014-02-24 | Discharge: 2014-02-24 | Disposition: A | Discharge status: Home / Self Care | Payer: Medicare Other | Source: Ambulatory Visit | Attending: Radiation Oncology | Admitting: Radiation Oncology

## 2014-02-24 DIAGNOSIS — Z510 Encounter for antineoplastic radiation therapy: Secondary | ICD-10-CM | POA: Diagnosis not present

## 2014-02-25 ENCOUNTER — Ambulatory Visit
Admission: RE | Admit: 2014-02-25 | Discharge: 2014-02-25 | Disposition: A | Discharge status: Home / Self Care | Payer: Medicare Other | Source: Ambulatory Visit | Attending: Radiation Oncology | Admitting: Radiation Oncology

## 2014-02-25 DIAGNOSIS — Z510 Encounter for antineoplastic radiation therapy: Secondary | ICD-10-CM | POA: Diagnosis not present

## 2014-02-26 ENCOUNTER — Ambulatory Visit
Admission: RE | Admit: 2014-02-26 | Discharge: 2014-02-26 | Disposition: A | Discharge status: Home / Self Care | Payer: Medicare Other | Source: Ambulatory Visit | Attending: Radiation Oncology | Admitting: Radiation Oncology

## 2014-02-26 ENCOUNTER — Encounter: Payer: Self-pay | Admitting: Radiation Oncology

## 2014-02-26 VITALS — Resp 16 | Wt 182.1 lb

## 2014-02-26 DIAGNOSIS — Z510 Encounter for antineoplastic radiation therapy: Secondary | ICD-10-CM | POA: Diagnosis not present

## 2014-02-26 DIAGNOSIS — C61 Malignant neoplasm of prostate: Secondary | ICD-10-CM

## 2014-02-26 NOTE — Progress Notes (Signed)
  Radiation Oncology         [phone] ________________________________   Name: Kenneth Bowen MRN: [id number]  Date: 02/26/2014  DOB: Feb 02, 1931  Weekly Radiation Therapy Management    ICD-9-CM ICD-10-CM  1. Prostate cancer 185 C61    Current Dose: 37.05 Gy     Planned Dose:  78 Gy  Narrative . . . . . . . . The patient presents for routine under treatment assessment.                                  Biggest complaint today is rectal irritation. Discuss warm bath soaks, tucks wipes, baby wipes with aloe, and prep H. Reports he has to sit to urinate now because with each urination he passes a small amount of stool. Reports nocturia x3. Reports intense dysuria and hesitancy with nocturia. Reports taking motrin to relieve this. Denies pain. Two pound weight loss noted since last week. Denies fatigue. Vitals stable. Patient denies fatigue. Reports that he continues to work out twice per week.                                  Set-up films were reviewed.                                 The chart was checked. Physical Findings. . .  weight is 182 lb 1.6 oz (82.6 kg). His respiration is 16. . Weight essentially stable.  No significant changes. Impression . . . . . . . The patient is tolerating radiation. Plan . . . . . . . . . . . . Continue treatment as planned.  ________________________________  Sheral Apley. Tammi Klippel, M.D.

## 2014-02-26 NOTE — Progress Notes (Signed)
Biggest complaint today is rectal irritation. Discuss warm bath soaks, tucks wipes, baby wipes with aloe, and prep H. Reports he has to sit to urinate now because with each urination he passes a small amount of stool. Reports nocturia x3. Reports intense dysuria and hesitancy with nocturia. Reports taking motrin to relieve this. Denies pain. Two pound weight loss noted since last week. Denies fatigue. Vitals stable. Patient denies fatigue. Reports that he continues to work out twice per week.

## 2014-03-01 ENCOUNTER — Ambulatory Visit
Admission: RE | Admit: 2014-03-01 | Discharge: 2014-03-01 | Disposition: A | Discharge status: Home / Self Care | Payer: Medicare Other | Source: Ambulatory Visit | Attending: Radiation Oncology | Admitting: Radiation Oncology

## 2014-03-01 DIAGNOSIS — Z510 Encounter for antineoplastic radiation therapy: Secondary | ICD-10-CM | POA: Diagnosis not present

## 2014-03-02 ENCOUNTER — Ambulatory Visit
Admission: RE | Admit: 2014-03-02 | Discharge: 2014-03-02 | Disposition: A | Discharge status: Home / Self Care | Payer: Medicare Other | Source: Ambulatory Visit | Attending: Radiation Oncology | Admitting: Radiation Oncology

## 2014-03-02 DIAGNOSIS — Z510 Encounter for antineoplastic radiation therapy: Secondary | ICD-10-CM | POA: Diagnosis not present

## 2014-03-03 ENCOUNTER — Ambulatory Visit
Admission: RE | Admit: 2014-03-03 | Discharge: 2014-03-03 | Disposition: A | Discharge status: Home / Self Care | Payer: Medicare Other | Source: Ambulatory Visit | Attending: Radiation Oncology | Admitting: Radiation Oncology

## 2014-03-03 DIAGNOSIS — Z510 Encounter for antineoplastic radiation therapy: Secondary | ICD-10-CM | POA: Diagnosis not present

## 2014-03-04 ENCOUNTER — Ambulatory Visit
Admission: RE | Admit: 2014-03-04 | Discharge: 2014-03-04 | Disposition: A | Discharge status: Home / Self Care | Payer: Medicare Other | Source: Ambulatory Visit | Attending: Radiation Oncology | Admitting: Radiation Oncology

## 2014-03-04 DIAGNOSIS — Z510 Encounter for antineoplastic radiation therapy: Secondary | ICD-10-CM | POA: Diagnosis not present

## 2014-03-05 ENCOUNTER — Encounter: Payer: Self-pay | Admitting: Radiation Oncology

## 2014-03-05 ENCOUNTER — Ambulatory Visit
Admission: RE | Admit: 2014-03-05 | Discharge: 2014-03-05 | Disposition: A | Discharge status: Home / Self Care | Payer: Medicare Other | Source: Ambulatory Visit | Attending: Radiation Oncology | Admitting: Radiation Oncology

## 2014-03-05 VITALS — BP 107/68 | HR 56 | Resp 16 | Wt 180.8 lb

## 2014-03-05 DIAGNOSIS — C61 Malignant neoplasm of prostate: Secondary | ICD-10-CM

## 2014-03-05 DIAGNOSIS — Z510 Encounter for antineoplastic radiation therapy: Secondary | ICD-10-CM | POA: Diagnosis not present

## 2014-03-05 MED ORDER — TAMSULOSIN HCL 0.4 MG PO CAPS: 0.4000 mg | ORAL_CAPSULE | Freq: Every day | ORAL | Status: DC

## 2014-03-05 NOTE — Progress Notes (Signed)
  Radiation Oncology         [phone] ________________________________   Name: Kenneth Bowen MRN: [id number]  Date: 03/05/2014  DOB: 1931/01/11  Weekly Radiation Therapy Management    ICD-9-CM ICD-10-CM   1. Prostate cancer 185 C61 tamsulosin (FLOMAX) 0.4 MG CAPS capsule    Current Dose: 46.8 Gy     Planned Dose:  78 Gy  Narrative . . . . . . . . The patient presents for routine under treatment assessment.                                  Biggest complaint today is rectal irritation. Discuss warm bath soaks, tucks wipes, baby wipes with aloe, and prep H. Reports he has to sit to urinate now because with each urination he passes a small amount of stool. Reports nocturia x3. Reports intense dysuria and hesitancy with nocturia. Reports taking motrin to relieve this. Denies pain. Two pound weight loss noted since last week. Denies fatigue. Vitals stable. Patient denies fatigue. Reports that he continues to work out twice per week.                                   Set-up films were reviewed.                                 The chart was checked. Physical Findings. . .  weight is 180 lb 12.8 oz (82.01 kg). His blood pressure is 107/68 and his pulse is 56. His respiration is 16. . Weight essentially stable.  No significant changes. Impression . . . . . . . The patient is tolerating radiation. Plan . . . . . . . . . . . . Continue treatment as planned. Started Flomax  ________________________________  Sheral Apley Tammi Klippel, M.D.

## 2014-03-05 NOTE — Progress Notes (Signed)
Weight and vitals stable. Reports hesitancy worse at night. Reports that he continues to pass stool while urinating. Tucks and medicated wipes are helping with rectal irritation. Reports dysuria at night. Reports difficulty emptying his bladder. Reports nocturia x4. Reports taking ibuprofen an hour before bed and in the middle of the night. Patient asked numerous questions about flomax.

## 2014-03-08 ENCOUNTER — Ambulatory Visit
Admission: RE | Admit: 2014-03-08 | Discharge: 2014-03-08 | Disposition: A | Discharge status: Home / Self Care | Payer: Medicare Other | Source: Ambulatory Visit | Attending: Radiation Oncology | Admitting: Radiation Oncology

## 2014-03-08 DIAGNOSIS — Z510 Encounter for antineoplastic radiation therapy: Secondary | ICD-10-CM | POA: Diagnosis not present

## 2014-03-09 ENCOUNTER — Ambulatory Visit
Admission: RE | Admit: 2014-03-09 | Discharge: 2014-03-09 | Disposition: A | Discharge status: Home / Self Care | Payer: Medicare Other | Source: Ambulatory Visit | Attending: Radiation Oncology | Admitting: Radiation Oncology

## 2014-03-09 DIAGNOSIS — Z510 Encounter for antineoplastic radiation therapy: Secondary | ICD-10-CM | POA: Diagnosis not present

## 2014-03-10 ENCOUNTER — Ambulatory Visit
Admission: RE | Admit: 2014-03-10 | Discharge: 2014-03-10 | Disposition: A | Discharge status: Home / Self Care | Payer: Medicare Other | Source: Ambulatory Visit | Attending: Radiation Oncology | Admitting: Radiation Oncology

## 2014-03-10 DIAGNOSIS — Z510 Encounter for antineoplastic radiation therapy: Secondary | ICD-10-CM | POA: Diagnosis not present

## 2014-03-11 ENCOUNTER — Ambulatory Visit
Admission: RE | Admit: 2014-03-11 | Discharge: 2014-03-11 | Disposition: A | Discharge status: Home / Self Care | Payer: Medicare Other | Source: Ambulatory Visit | Attending: Radiation Oncology | Admitting: Radiation Oncology

## 2014-03-11 DIAGNOSIS — Z510 Encounter for antineoplastic radiation therapy: Secondary | ICD-10-CM | POA: Diagnosis not present

## 2014-03-12 ENCOUNTER — Ambulatory Visit
Admission: RE | Admit: 2014-03-12 | Discharge: 2014-03-12 | Disposition: A | Discharge status: Home / Self Care | Payer: Medicare Other | Source: Ambulatory Visit | Attending: Radiation Oncology | Admitting: Radiation Oncology

## 2014-03-12 ENCOUNTER — Encounter: Payer: Self-pay | Admitting: Radiation Oncology

## 2014-03-12 ENCOUNTER — Inpatient Hospital Stay
Admission: RE | Admit: 2014-03-12 | Discharge: 2014-03-12 | Disposition: A | Discharge status: Home / Self Care | Payer: Medicare Other | Source: Ambulatory Visit | Attending: Radiation Oncology | Admitting: Radiation Oncology

## 2014-03-12 VITALS — BP 114/67 | HR 57 | Resp 16 | Wt 184.8 lb

## 2014-03-12 DIAGNOSIS — C61 Malignant neoplasm of prostate: Secondary | ICD-10-CM

## 2014-03-12 DIAGNOSIS — Z510 Encounter for antineoplastic radiation therapy: Secondary | ICD-10-CM | POA: Diagnosis not present

## 2014-03-12 NOTE — Progress Notes (Signed)
  Radiation Oncology         [phone] ________________________________   Name: Kenneth Bowen MRN: [id number]  Date: 03/12/2014  DOB: [birth year]  Weekly Radiation Therapy Management    ICD-9-CM ICD-10-CM  1. Prostate cancer 185 C61    Current Dose: 56.55 Gy     Planned Dose:  78 Gy  Narrative . . . . . . . . The patient presents for routine under treatment assessment.                                  Weight and vitals stable. Reports hesitancy worse at night. Reports that he continues to pass stool while urinating. Tucks and medicated wipes are helping with rectal irritation. Reports dysuria at night. Reports difficulty emptying his bladder. Reports nocturia x4. Reports taking ibuprofen an hour before bed and in the middle of the night. Patient asked numerous questions about flomax.                                  Set-up films were reviewed.                                 The chart was checked. Physical Findings. . .  weight is 184 lb 12.8 oz (83.825 kg). His blood pressure is 114/67 and his pulse is 57. His respiration is 16. . Weight essentially stable.  No significant changes. Impression . . . . . . . The patient is tolerating radiation. Plan . . . . . . . . . . . . Continue treatment as planned. Started Flomax  ________________________________  Sheral Apley Tammi Klippel, M.D.

## 2014-03-12 NOTE — Progress Notes (Signed)
Reports on average on average he passes two loose stools per day. Reports rectal irritation has resolved. Thankful for flomax. Reports nocturia x1 since starting flomax. Reports dysuria has resolved. Continues to work out regularly. Denies fatigue. Weight and vitals stable. Denies pain.

## 2014-03-15 ENCOUNTER — Ambulatory Visit
Admission: RE | Admit: 2014-03-15 | Discharge: 2014-03-15 | Disposition: A | Discharge status: Home / Self Care | Payer: Medicare Other | Source: Ambulatory Visit | Attending: Radiation Oncology | Admitting: Radiation Oncology

## 2014-03-15 DIAGNOSIS — Z510 Encounter for antineoplastic radiation therapy: Secondary | ICD-10-CM | POA: Diagnosis not present

## 2014-03-16 ENCOUNTER — Ambulatory Visit: Payer: Medicare Other

## 2014-03-17 ENCOUNTER — Ambulatory Visit
Admission: RE | Admit: 2014-03-17 | Discharge: 2014-03-17 | Disposition: A | Discharge status: Home / Self Care | Payer: Medicare Other | Source: Ambulatory Visit | Attending: Radiation Oncology | Admitting: Radiation Oncology

## 2014-03-17 DIAGNOSIS — Z510 Encounter for antineoplastic radiation therapy: Secondary | ICD-10-CM | POA: Diagnosis not present

## 2014-03-18 ENCOUNTER — Ambulatory Visit
Admission: RE | Admit: 2014-03-18 | Discharge: 2014-03-18 | Disposition: A | Discharge status: Home / Self Care | Payer: Medicare Other | Source: Ambulatory Visit | Attending: Radiation Oncology | Admitting: Radiation Oncology

## 2014-03-18 ENCOUNTER — Encounter: Payer: Self-pay | Admitting: Radiation Oncology

## 2014-03-18 VITALS — BP 126/70 | HR 62 | Resp 16 | Wt 185.3 lb

## 2014-03-18 DIAGNOSIS — C61 Malignant neoplasm of prostate: Secondary | ICD-10-CM

## 2014-03-18 DIAGNOSIS — Z510 Encounter for antineoplastic radiation therapy: Secondary | ICD-10-CM | POA: Diagnosis not present

## 2014-03-18 NOTE — Progress Notes (Signed)
  Radiation Oncology         [phone] ________________________________   Name: Kenneth Bowen MRN: [id number]  Date: 03/18/2014  DOB: Jul 28, 1930  Weekly Radiation Therapy Management    ICD-9-CM ICD-10-CM   1. Prostate cancer 185 C61     Current Dose: 62.4 Gy     Planned Dose:  78 Gy  Narrative . . . . . . . . The patient presents for routine under treatment assessment.                                  Reports rectal itching began this morning. Encouraged to apply hydrocortisone to rectum. Continues to use tucks and baby wipes for rectal irritation. Reports mild dysuria. Weight and vitals stable. Reports he continues to use Flomax. Reports nocturia every hour and a half during the night despite taking ibuprofen. Reports mild fatigue. Reports his heart rate is ten beats higher while exercising. Reports his leg feel different and weaker. Missed treatment because machine was down. Questions doubling up the Friday before he finishes so he can keep his reservations.                                Set-up films were reviewed.                                 The chart was checked. Physical Findings. . .  weight is 185 lb 4.8 oz (84.052 kg). His blood pressure is 126/70 and his pulse is 62. His respiration is 16. . Weight essentially stable.  No significant changes. Impression . . . . . . . The patient is tolerating radiation. Plan . . . . . . . . . . . . Continue treatment as planned.   ________________________________  Sheral Apley. Tammi Klippel, M.D.

## 2014-03-18 NOTE — Progress Notes (Signed)
Reports rectal itching began this morning. Encouraged to apply hydrocortisone to rectum. Continues to use tucks and baby wipes for rectal irritation. Reports mild dysuria. Weight and vitals stable. Reports he continues to use Flomax. Reports nocturia every hour and a half during the night despite taking ibuprofen. Reports mild fatigue. Reports his heart rate is ten beats higher while exercising. Reports his leg feel different and weaker. Missed treatment because machine was down. Questions doubling up the Friday before he finishes so he can keep his reservations.

## 2014-03-19 ENCOUNTER — Ambulatory Visit
Admission: RE | Admit: 2014-03-19 | Discharge: 2014-03-19 | Disposition: A | Discharge status: Home / Self Care | Payer: Medicare Other | Source: Ambulatory Visit | Attending: Radiation Oncology | Admitting: Radiation Oncology

## 2014-03-19 DIAGNOSIS — Z510 Encounter for antineoplastic radiation therapy: Secondary | ICD-10-CM | POA: Diagnosis not present

## 2014-03-22 ENCOUNTER — Ambulatory Visit
Admission: RE | Admit: 2014-03-22 | Discharge: 2014-03-22 | Disposition: A | Discharge status: Home / Self Care | Payer: Medicare Other | Source: Ambulatory Visit | Attending: Radiation Oncology | Admitting: Radiation Oncology

## 2014-03-22 DIAGNOSIS — Z510 Encounter for antineoplastic radiation therapy: Secondary | ICD-10-CM | POA: Diagnosis not present

## 2014-03-23 ENCOUNTER — Ambulatory Visit
Admission: RE | Admit: 2014-03-23 | Discharge: 2014-03-23 | Disposition: A | Discharge status: Home / Self Care | Payer: Medicare Other | Source: Ambulatory Visit | Attending: Radiation Oncology | Admitting: Radiation Oncology

## 2014-03-23 DIAGNOSIS — Z510 Encounter for antineoplastic radiation therapy: Secondary | ICD-10-CM | POA: Diagnosis not present

## 2014-03-24 ENCOUNTER — Ambulatory Visit
Admission: RE | Admit: 2014-03-24 | Discharge: 2014-03-24 | Disposition: A | Discharge status: Home / Self Care | Payer: Medicare Other | Source: Ambulatory Visit | Attending: Radiation Oncology | Admitting: Radiation Oncology

## 2014-03-24 DIAGNOSIS — Z510 Encounter for antineoplastic radiation therapy: Secondary | ICD-10-CM | POA: Diagnosis not present

## 2014-03-25 ENCOUNTER — Encounter: Payer: Self-pay | Admitting: Radiation Oncology

## 2014-03-25 ENCOUNTER — Ambulatory Visit
Admission: RE | Admit: 2014-03-25 | Discharge: 2014-03-25 | Disposition: A | Discharge status: Home / Self Care | Payer: Medicare Other | Source: Ambulatory Visit | Attending: Radiation Oncology | Admitting: Radiation Oncology

## 2014-03-25 VITALS — BP 120/58 | HR 60 | Resp 16 | Wt 184.8 lb

## 2014-03-25 DIAGNOSIS — Z510 Encounter for antineoplastic radiation therapy: Secondary | ICD-10-CM | POA: Diagnosis not present

## 2014-03-25 DIAGNOSIS — C61 Malignant neoplasm of prostate: Secondary | ICD-10-CM

## 2014-03-25 NOTE — Progress Notes (Signed)
Weight and vitals stable. Leg weakness continues. Patient denies fatigue. Reports he continues to work out at the gym twice per week. Reports dysuria resolved with flomax until last night when he experienced a mild burning sensation while voiding. Reports diarrhea is less but, he continues to pass small amounts of stool or mucus when he defecates. Reports rectal irritation has resolved. Reports nocturia is less. Reports voiding twice during the night last night.

## 2014-03-25 NOTE — Progress Notes (Signed)
  Radiation Oncology         [phone] ________________________________   Name: Kenneth Bowen MRN: [id number]  Date: 03/25/2014  DOB: 04/08/1931  Weekly Radiation Therapy Management    ICD-9-CM ICD-10-CM   1. Prostate cancer 185 C61     Current Dose: 72.15 Gy     Planned Dose:  78 Gy  Narrative . . . . . . . . The patient presents for routine under treatment assessment.                                  Weight and vitals stable. Leg weakness continues. Patient denies fatigue. Reports he continues to work out at the gym twice per week. Reports dysuria resolved with flomax until last night when he experienced a mild burning sensation while voiding. Reports diarrhea is less but, he continues to pass small amounts of stool or mucus when he defecates. Reports rectal irritation has resolved. Reports nocturia is less. Reports voiding twice during the night last night.                                 Set-up films were reviewed.                                 The chart was checked. Physical Findings. . .  weight is 184 lb 12.8 oz (83.825 kg). His blood pressure is 120/58 and his pulse is 60. His respiration is 16. . Weight essentially stable.  No significant changes. Impression . . . . . . . The patient is tolerating radiation. Plan . . . . . . . . . . . . Continue treatment as planned.   ________________________________  Sheral Apley. Tammi Klippel, M.D.

## 2014-03-26 ENCOUNTER — Ambulatory Visit
Admission: RE | Admit: 2014-03-26 | Discharge: 2014-03-26 | Disposition: A | Discharge status: Home / Self Care | Payer: Medicare Other | Source: Ambulatory Visit | Attending: Radiation Oncology | Admitting: Radiation Oncology

## 2014-03-26 DIAGNOSIS — Z510 Encounter for antineoplastic radiation therapy: Secondary | ICD-10-CM | POA: Diagnosis not present

## 2014-03-29 ENCOUNTER — Ambulatory Visit
Admission: RE | Admit: 2014-03-29 | Discharge: 2014-03-29 | Disposition: A | Discharge status: Home / Self Care | Payer: Medicare Other | Source: Ambulatory Visit | Attending: Radiation Oncology | Admitting: Radiation Oncology

## 2014-03-29 ENCOUNTER — Ambulatory Visit: Payer: Medicare Other

## 2014-03-29 ENCOUNTER — Encounter: Payer: Self-pay | Admitting: Radiation Oncology

## 2014-03-29 DIAGNOSIS — Z510 Encounter for antineoplastic radiation therapy: Secondary | ICD-10-CM | POA: Diagnosis not present

## 2014-03-29 NOTE — Progress Notes (Signed)
  Radiation Oncology         [phone]) [phone] ________________________________  Name: Kenneth Bowen MRN: [id number]  Date: 03/29/2014  DOB: 11-Apr-1931  End of Treatment Note  Diagnosis:   80 y.o. gentleman with stage T2a adenocarcinoma of the prostate with a Gleason's score of 3+4 and a PSA of 6.38  Indication for treatment: Curative, Definitive Radiotherapy       Radiation treatment dates:   02/02/2014-03/29/2014  Site/dose:   The prostate was treated to 78 Gy in 40 fractions  Beams/energy:   The patient was treated with IMRT using 2 volumetric arc beams circumferentially in the clockwise and counterclockwise direction with a 90 degree collimator offset to minimize collimator dose scalloping.  Image guidance was performed with pre-fraction cone beam CT and immobilization was achieved with BodyFix Custom pillow.  Narrative: The patient tolerated radiation treatment relatively well.   He had some minor urinary symptoms.  Plan: The patient has completed radiation treatment. The patient will return to radiation oncology clinic for routine followup in one month. I advised them to call or return sooner if they have any questions or concerns related to their recovery or treatment. ________________________________  Sheral Apley. Tammi Klippel, M.D.

## 2014-03-30 ENCOUNTER — Ambulatory Visit: Payer: Medicare Other

## 2014-03-31 ENCOUNTER — Ambulatory Visit: Payer: Medicare Other

## 2014-04-29 ENCOUNTER — Encounter: Payer: Self-pay | Admitting: Radiation Oncology

## 2014-04-29 ENCOUNTER — Telehealth: Payer: Self-pay | Admitting: *Deleted

## 2014-04-29 ENCOUNTER — Ambulatory Visit
Admission: RE | Admit: 2014-04-29 | Discharge: 2014-04-29 | Disposition: A | Discharge status: Home / Self Care | Payer: Medicare Other | Source: Ambulatory Visit | Attending: Radiation Oncology | Admitting: Radiation Oncology

## 2014-04-29 VITALS — BP 109/65 | HR 57 | Resp 16 | Wt 186.1 lb

## 2014-04-29 DIAGNOSIS — C61 Malignant neoplasm of prostate: Secondary | ICD-10-CM

## 2014-04-29 NOTE — Progress Notes (Signed)
Reports nocturia every two hours. Explains that around 0400 he takes an ibuprofen so that he can sleep until 0800 without getting up to void. Reports that dysuria stopped with flomax. Reports flomax "works great" and goes on to explain he has a strong steady urine stream. Reports intermittent urgency. Reports he voids every two hours during the day to avoid incontinence. Reports he began this routine following one episode of incontinence. Intermittent dry cough noted. Reports PCP is aware of cough. Reports he began spotting his underwear three days ago with dark brown and blood tinged spotting. Reports rectal irritation has resolved. Denies diarrhea. Denies pain. Weight and vitals stable.

## 2014-04-29 NOTE — Progress Notes (Signed)
  Radiation Oncology         [phone] ________________________________  Name: Kenneth Bowen MRN: [id number]  Date: 04/29/2014  DOB: [birth year]  Follow-Up Visit Note  CC: GREEN, [name], MD  Arvil Persons, MD  Diagnosis:   80 y.o. gentleman with stage T2a adenocarcinoma of the prostate with a Gleason's score of 3+4 and a PSA of 6.38    ICD-9-CM ICD-10-CM   1. Prostate cancer 185 C61     Interval Since Last Radiation:  4  weeks  Narrative:  The patient returns today for routine follow-up.  Reports nocturia every two hours. Explains that around 0400 he takes an ibuprofen so that he can sleep until 0800 without getting up to void. Reports that dysuria stopped with flomax. Reports flomax "works great" and goes on to explain he has a strong steady urine stream. Reports intermittent urgency. Reports he voids every two hours during the day to avoid incontinence. Reports he began this routine following one episode of incontinence. Intermittent dry cough noted. Reports PCP is aware of cough. Reports he began spotting his underwear three days ago with dark brown and blood tinged spotting. Reports rectal irritation has resolved. Denies diarrhea. Denies pain. Weight and vitals stable.                               ALLERGIES:  has No Known Allergies.  Meds: Current Outpatient Prescriptions  Medication Sig Dispense Refill  . ALPRAZolam (XANAX) 1 MG tablet Take 1 mg by mouth at bedtime as needed for anxiety.    Marland Kitchen aspirin 81 MG tablet Take 81 mg by mouth daily. Every other day    . benzonatate (TESSALON) 200 MG capsule Take 200 mg by mouth 3 (three) times daily as needed for cough.    . cholecalciferol (VITAMIN D) 1000 UNITS tablet Take 1,000 Units by mouth daily.    . Multiple Vitamin (MULTIVITAMIN) tablet Take 1 tablet by mouth daily.    . tamsulosin (FLOMAX) 0.4 MG CAPS capsule Take 1 capsule (0.4 mg total) by mouth daily after supper. 30 capsule 5  . cetirizine (ZYRTEC) 10 MG tablet Take 10  mg by mouth daily.     No current facility-administered medications for this encounter.    Physical Findings: The patient is in no acute distress. Patient is alert and oriented.  weight is 186 lb 1.6 oz (84.414 kg). His blood pressure is 109/65 and his pulse is 57. His respiration is 16. .  No significant changes.  Lab Findings: Lab Results  Component Value Date   WBC 5.9 06/06/2010   HGB 13.6 06/06/2010   HCT 39.7 06/06/2010   MCV 96.7 06/06/2010   PLT 259.0 06/06/2010    Impression:  The patient is recovering from the effects of radiation.    Plan:  He will continue to follow-up with urology for ongoing PSA determinations.  I will look forward to following his response through their correspondence, and be happy to participate in care if clinically indicated.  I talked to the patient about what to expect in the future, including his risk for erectile dysfunction and rectal bleeding.  I encouraged him to call or return to the office if he has any question about his previous radiation or possible radiation effects.  He was comfortable with this plan.  _____________________________________  Sheral Apley. Tammi Klippel, M.D.

## 2014-04-29 NOTE — Telephone Encounter (Signed)
CALLED PATIENT TO INFORM OF FU VISIT WITH DR. Janice Norrie ON 05/27/14- ARRIVAL TIME - 11:30 AM, SPOKE WITH PATIENT AND HE IS AWARE OF THIS APPT., MAILED APPT. CARD

## 2014-08-16 ENCOUNTER — Other Ambulatory Visit: Payer: Self-pay | Admitting: Radiation Oncology

## 2014-09-14 ENCOUNTER — Other Ambulatory Visit: Payer: Self-pay | Admitting: Internal Medicine

## 2014-09-14 DIAGNOSIS — R053 Chronic cough: Secondary | ICD-10-CM

## 2014-09-14 DIAGNOSIS — R062 Wheezing: Secondary | ICD-10-CM

## 2014-09-14 DIAGNOSIS — R05 Cough: Secondary | ICD-10-CM

## 2014-09-17 ENCOUNTER — Ambulatory Visit
Admission: RE | Admit: 2014-09-17 | Discharge: 2014-09-17 | Disposition: A | Discharge status: Home / Self Care | Payer: Medicare Other | Source: Ambulatory Visit | Attending: Internal Medicine | Admitting: Internal Medicine

## 2014-09-17 DIAGNOSIS — R05 Cough: Secondary | ICD-10-CM

## 2014-09-17 DIAGNOSIS — R062 Wheezing: Secondary | ICD-10-CM

## 2014-09-17 DIAGNOSIS — R053 Chronic cough: Secondary | ICD-10-CM

## 2014-09-17 MED ORDER — IOPAMIDOL (ISOVUE-300) INJECTION 61%
75.0000 mL | Freq: Once | INTRAVENOUS | Status: AC | PRN
  Administered 2014-09-17: 75 mL via INTRAVENOUS

## 2014-10-27 ENCOUNTER — Encounter: Payer: Self-pay | Admitting: Gastroenterology

## 2014-12-13 ENCOUNTER — Other Ambulatory Visit: Payer: Self-pay | Admitting: Radiation Oncology

## 2015-01-03 ENCOUNTER — Encounter: Payer: Self-pay | Admitting: Gastroenterology

## 2015-01-03 ENCOUNTER — Ambulatory Visit (INDEPENDENT_AMBULATORY_CARE_PROVIDER_SITE_OTHER): Payer: Medicare Other | Admitting: Gastroenterology

## 2015-01-03 VITALS — BP 116/62 | HR 91 | Ht 71.0 in | Wt 186.4 lb

## 2015-01-03 DIAGNOSIS — R6889 Other general symptoms and signs: Secondary | ICD-10-CM

## 2015-01-03 DIAGNOSIS — Z86010 Personal history of colonic polyps: Secondary | ICD-10-CM | POA: Diagnosis not present

## 2015-01-03 NOTE — Assessment & Plan Note (Signed)
Last colonoscopy 2012 negative for polyps.  We do not routinely scheduled follow-up colonoscopy past 78-80.  Because of the abnormality of his rectal exam I will do a sigmoidoscopy.

## 2015-01-03 NOTE — Assessment & Plan Note (Signed)
There may be a small abnormality in the rectal vault, possibly a polyp.  Recommendations #1 sigmoidoscopy

## 2015-01-03 NOTE — Patient Instructions (Signed)
You have been scheduled for a flexible sigmoidoscopy. Please follow the written instructions given to you at your visit today. If you use inhalers (even only as needed), please bring them with you on the day of your procedure.  

## 2015-01-03 NOTE — Progress Notes (Signed)
_                                                                                                                History of Present Illness:  Kenneth Bowen is a pleasant 81 year old white male referred at the request of Dr. Nyoka Cowden for evaluation of an abnormal rectal exam.  On routine examination Dr. Nyoka Cowden and abnormal area at the tip of his examining finger.  The patient has no GI complaints including change in bowel habits, abdominal pain or rectal bleeding.  An adenomatous polyp was removed in 2003.  2012 colonoscopy was pertinent for few diverticula and internal hemorrhoids.  He had limited rectal bleeding at that time.   Past Medical History  Diagnosis Date  . Prostate cancer   . Diverticulosis   . Colon polyps    Past Surgical History  Procedure Laterality Date  . Inguinal hernia repair    . Knee surgery    . Prostate biopsy     family history includes Alcoholism in his father. There is no history of Cancer. Current Outpatient Prescriptions  Medication Sig Dispense Refill  . ALPRAZolam (XANAX) 1 MG tablet Take 1 mg by mouth at bedtime as needed for anxiety.    Marland Kitchen aspirin 81 MG tablet Take 81 mg by mouth daily. Every other day    . benzonatate (TESSALON) 200 MG capsule Take 200 mg by mouth 3 (three) times daily as needed for cough.    . cetirizine (ZYRTEC) 10 MG tablet Take 10 mg by mouth daily.    . cholecalciferol (VITAMIN D) 1000 UNITS tablet Take 1,000 Units by mouth daily.    . Multiple Vitamin (MULTIVITAMIN) tablet Take 1 tablet by mouth daily.    . tamsulosin (FLOMAX) 0.4 MG CAPS capsule TAKE ONE CAPSULE BY MOUTH EVERY DAY AFTER SUPPER 30 capsule 5   No current facility-administered medications for this visit.   Allergies as of 01/03/2015  . (No Known Allergies)    reports that he has never smoked. He has never used smokeless tobacco. He reports that he drinks alcohol. He reports that he does not use illicit drugs.   Review of Systems: Pertinent  positive and negative review of systems were noted in the above HPI section. All other review of systems were otherwise negative.  Vital signs were reviewed in today's medical record Physical Exam: General: Well developed , well nourished, no acute distress Skin: anicteric Head: Normocephalic and atraumatic Eyes:  sclerae anicteric, EOMI Ears: Normal auditory acuity Mouth: No deformity or lesions Neck: Supple, no masses or thyromegaly Lymph Nodes: no lymphadenopathy Lungs: Clear throughout to auscultation Heart: Regular rate and rhythm; no murmurs, rubs or bruits Gastroinestinal: Soft, non tender and non distended. No masses, hepatosplenomegaly or hernias noted. Normal Bowel sounds Rectal: Prostate is normal.  At approximately a centimeters from the and overt there is a 23 mm area of firmly close to within age feeling to it.  Stool is Hemoccult-negative Musculoskeletal: Symmetrical with no gross deformities  Skin: No lesions  on visible extremities Pulses:  Normal pulses noted Extremities: No clubbing, cyanosis, edema or deformities noted Neurological: Alert oriented x 4, grossly nonfocal Cervical Nodes:  No significant cervical adenopathy Inguinal Nodes: No significant inguinal adenopathy Psychological:  Alert and cooperative. Normal mood and affect  See Assessment and Plan under Problem List

## 2015-02-17 ENCOUNTER — Encounter: Payer: Self-pay | Admitting: Gastroenterology

## 2015-02-28 ENCOUNTER — Other Ambulatory Visit: Payer: Medicare Other | Admitting: Gastroenterology

## 2015-03-07 ENCOUNTER — Other Ambulatory Visit: Payer: Medicare Other | Admitting: Gastroenterology

## 2015-03-10 ENCOUNTER — Ambulatory Visit (AMBULATORY_SURGERY_CENTER): Payer: Medicare Other | Admitting: Gastroenterology

## 2015-03-10 ENCOUNTER — Encounter: Payer: Self-pay | Admitting: Gastroenterology

## 2015-03-10 VITALS — BP 112/66 | HR 49 | Temp 95.3°F | Resp 33 | Ht 71.0 in | Wt 186.0 lb

## 2015-03-10 DIAGNOSIS — R6889 Other general symptoms and signs: Secondary | ICD-10-CM | POA: Diagnosis not present

## 2015-03-10 DIAGNOSIS — D125 Benign neoplasm of sigmoid colon: Secondary | ICD-10-CM

## 2015-03-10 DIAGNOSIS — Z86010 Personal history of colonic polyps: Secondary | ICD-10-CM

## 2015-03-10 MED ORDER — SODIUM CHLORIDE 0.9 % IV SOLN
500.0000 mL | INTRAVENOUS | Status: DC
Start: 2015-03-10 — End: 2015-03-10

## 2015-03-10 NOTE — Progress Notes (Signed)
Report to PACU, RN, vss, BBS= Clear.  

## 2015-03-10 NOTE — Op Note (Signed)
[facility]  [address] [place], [id number]   FLEXIBLE SIGMOIDOSCOPY PROCEDURE REPORT  PATIENT: Kenneth Bowen, Kenneth Bowen  MR#: [id number] BIRTHDATE: [birth year] , 61  yrs. old GENDER: male ENDOSCOPIST: Ladene Artist, MD, Marval Regal REFERRED BY: Levin Erp, M.D. PROCEDURE DATE:  03/10/2015 PROCEDURE:   Sigmoidoscopy with snare ASA CLASS:   Class II INDICATIONS:abnormal DRE. MEDICATIONS: Monitored anesthesia care and Propofol 100 mg IV DESCRIPTION OF PROCEDURE:   After the risks benefits and alternatives of the procedure were thoroughly explained, informed consent was obtained.  Digital exam revealed no abnormalities of the rectum. The LB [id number]  endoscope was introduced through the anus  and advanced to the descending colon , The exam was Without limitations.    The quality of the prep was The overall prep quality was excellent. . Estimated blood loss is zero unless otherwise noted in this procedure report. The instrument was then slowly withdrawn as the mucosa was fully examined.    COLON FINDINGS: There was mild diverticulosis noted in the descending colon and sigmoid colon.   A sessile polyp measuring 6 mm in size was found at 25 cm in the sigmoid colon.  A polypectomy was performed with a cold snare.  The resection was complete, the polyp tissue was completely retrieved and sent to histology.  The mucosa in the rectum, sigmoid and descending colon was otherwise normal.   Retroflexed views revealed internal Grade I hemorrhoids. The scope was then withdrawn from the patient and the procedure terminated.  COMPLICATIONS: There were no immediate complications.  ENDOSCOPIC IMPRESSION: 1.   Mild diverticulosis in the descending colon and sigmoid colon 2.   Sessile polyp in the sigmoid colon; polypectomy performed with a cold snare 3.   Grade l internal hemorrhoids  RECOMMENDATIONS: 1.  Await biopsy results   eSigned:  Ladene Artist, MD, [facility]  03/10/2015 10:47 AM

## 2015-03-10 NOTE — Progress Notes (Signed)
Called to room to assist during endoscopic procedure.  Patient ID and intended procedure confirmed with present staff. Received instructions for my participation in the procedure from the performing physician.  

## 2015-03-10 NOTE — Patient Instructions (Signed)
YOU HAD AN ENDOSCOPIC PROCEDURE TODAY AT THE [facility]:   Refer to the procedure report that was given to you for any specific questions about what was found during the examination.  If the procedure report does not answer your questions, please call your gastroenterologist to clarify.  If you requested that your care partner not be given the details of your procedure findings, then the procedure report has been included in a sealed envelope for you to review at your convenience later.  YOU SHOULD EXPECT: Some feelings of bloating in the abdomen. Passage of more gas than usual.  Walking can help get rid of the air that was put into your GI tract during the procedure and reduce the bloating. If you had a lower endoscopy (such as a colonoscopy or flexible sigmoidoscopy) you may notice spotting of blood in your stool or on the toilet paper. If you underwent a bowel prep for your procedure, you may not have a normal bowel movement for a few days.  Please Note:  You might notice some irritation and congestion in your nose or some drainage.  This is from the oxygen used during your procedure.  There is no need for concern and it should clear up in a day or so.  SYMPTOMS TO REPORT IMMEDIATELY:   Following lower endoscopy (colonoscopy or flexible sigmoidoscopy):  Excessive amounts of blood in the stool  Significant tenderness or worsening of abdominal pains  Swelling of the abdomen that is new, acute  Fever of 100F or higher   For urgent or emergent issues, a gastroenterologist can be reached at any hour by calling [phone].   DIET: Your first meal following the procedure should be a small meal and then it is ok to progress to your normal diet. Heavy or fried foods are harder to digest and may make you feel nauseous or bloated.  Likewise, meals heavy in dairy and vegetables can increase bloating.  Drink plenty of fluids but you should avoid alcoholic beverages for 24  hours.  ACTIVITY:  You should plan to take it easy for the rest of today and you should NOT DRIVE or use heavy machinery until tomorrow (because of the sedation medicines used during the test).    FOLLOW UP: Our staff will call the number listed on your records the next business day following your procedure to check on you and address any questions or concerns that you may have regarding the information given to you following your procedure. If we do not reach you, we will leave a message.  However, if you are feeling well and you are not experiencing any problems, there is no need to return our call.  We will assume that you have returned to your regular daily activities without incident.  If any biopsies were taken you will be contacted by phone or by letter within the next 1-3 weeks.  Please call us at [phone] if you have not heard about the biopsies in 3 weeks.    SIGNATURES/CONFIDENTIALITY: You and/or your care partner have signed paperwork which will be entered into your electronic medical record.  These signatures attest to the fact that that the information above on your After Visit Summary has been reviewed and is understood.  Full responsibility of the confidentiality of this discharge information lies with you and/or your care-partner. 

## 2015-03-11 ENCOUNTER — Encounter: Payer: Self-pay | Admitting: Gastroenterology

## 2015-03-11 ENCOUNTER — Telehealth: Payer: Self-pay | Admitting: Emergency Medicine

## 2015-03-11 NOTE — Telephone Encounter (Signed)
Left message, no identifier 

## 2015-03-20 ENCOUNTER — Encounter: Payer: Self-pay | Admitting: Gastroenterology

## 2015-03-29 ENCOUNTER — Telehealth: Payer: Self-pay | Admitting: Gastroenterology

## 2015-03-29 NOTE — Telephone Encounter (Signed)
All questions answered.  He will call back for any additional questions or concerns.  

## 2015-04-13 ENCOUNTER — Telehealth: Payer: Self-pay

## 2015-04-14 NOTE — Telephone Encounter (Signed)
Per Dr. Fuller Plan patient needs to be scheduled for a colonoscopy for history of polyps  Patient notified and he is scheduled for colon 06/14/15 10:30 pre-visit 05/26/15

## 2015-04-27 ENCOUNTER — Other Ambulatory Visit: Payer: Self-pay | Admitting: Radiation Oncology

## 2015-04-27 DIAGNOSIS — C61 Malignant neoplasm of prostate: Secondary | ICD-10-CM

## 2015-04-27 MED ORDER — TAMSULOSIN HCL 0.4 MG PO CAPS: 0.4000 mg | ORAL_CAPSULE | Freq: Every day | ORAL | Status: DC

## 2015-04-28 ENCOUNTER — Telehealth: Payer: Self-pay | Admitting: Radiation Oncology

## 2015-04-28 NOTE — Telephone Encounter (Signed)
Phoned patient making him aware the flomax refill he requested has been e-scribed by Dr. Tammi Klippel to [facility] on [address]. Patient verbalized understanding and expressed appreciation for the call.

## 2015-05-26 ENCOUNTER — Ambulatory Visit (AMBULATORY_SURGERY_CENTER): Payer: Self-pay

## 2015-05-26 VITALS — Ht 71.0 in | Wt 184.6 lb

## 2015-05-26 DIAGNOSIS — Z860100 Personal history of colon polyps, unspecified: Secondary | ICD-10-CM

## 2015-05-26 MED ORDER — SUPREP BOWEL PREP KIT 17.5-3.13-1.6 GM/177ML PO SOLN: 1.0000 | Freq: Once | ORAL | Status: DC

## 2015-05-26 NOTE — Progress Notes (Signed)
No allergies to eggs or soy No home oxygen No past problems with anesthesia No diet/weight loss meds  Has email and internet; refused emmi

## 2015-06-14 ENCOUNTER — Ambulatory Visit (AMBULATORY_SURGERY_CENTER): Payer: Medicare Other | Admitting: Gastroenterology

## 2015-06-14 ENCOUNTER — Encounter: Payer: Self-pay | Admitting: Gastroenterology

## 2015-06-14 VITALS — BP 114/61 | HR 46 | Temp 94.7°F | Resp 20 | Ht 71.0 in | Wt 184.0 lb

## 2015-06-14 DIAGNOSIS — D124 Benign neoplasm of descending colon: Secondary | ICD-10-CM | POA: Diagnosis not present

## 2015-06-14 DIAGNOSIS — Z86010 Personal history of colonic polyps: Secondary | ICD-10-CM | POA: Diagnosis present

## 2015-06-14 MED ORDER — SODIUM CHLORIDE 0.9 % IV SOLN: 500.0000 mL | INTRAVENOUS | Status: DC

## 2015-06-14 NOTE — Patient Instructions (Signed)
YOU HAD AN ENDOSCOPIC PROCEDURE TODAY AT [facility]:   Refer to the procedure report that was given to you for any specific questions about what was found during the examination.  If the procedure report does not answer your questions, please call your gastroenterologist to clarify.  If you requested that your care partner not be given the details of your procedure findings, then the procedure report has been included in a sealed envelope for you to review at your convenience later.  YOU SHOULD EXPECT: Some feelings of bloating in the abdomen. Passage of more gas than usual.  Walking can help get rid of the air that was put into your GI tract during the procedure and reduce the bloating. If you had a lower endoscopy (such as a colonoscopy or flexible sigmoidoscopy) you may notice spotting of blood in your stool or on the toilet paper. If you underwent a bowel prep for your procedure, you may not have a normal bowel movement for a few days.  Please Note:  You might notice some irritation and congestion in your nose or some drainage.  This is from the oxygen used during your procedure.  There is no need for concern and it should clear up in a day or so.  SYMPTOMS TO REPORT IMMEDIATELY:   Following lower endoscopy (colonoscopy or flexible sigmoidoscopy):  Excessive amounts of blood in the stool  Significant tenderness or worsening of abdominal pains  Swelling of the abdomen that is new, acute  Fever of 100F or higher     For urgent or emergent issues, a gastroenterologist can be reached at any hour by calling [phone].   DIET: Your first meal following the procedure should be a small meal and then it is ok to progress to your normal diet. Heavy or fried foods are harder to digest and may make you feel nauseous or bloated.  Likewise, meals heavy in dairy and vegetables can increase bloating.  Drink plenty of fluids but you should avoid alcoholic beverages for 24 hours.  Try  to increase the fiber in your diet due to the hemorrhoids and diverticulosis.  ACTIVITY:  You should plan to take it easy for the rest of today and you should NOT DRIVE or use heavy machinery until tomorrow (because of the sedation medicines used during the test).    FOLLOW UP: Our staff will call the number listed on your records the next business day following your procedure to check on you and address any questions or concerns that you may have regarding the information given to you following your procedure. If we do not reach you, we will leave a message.  However, if you are feeling well and you are not experiencing any problems, there is no need to return our call.  We will assume that you have returned to your regular daily activities without incident.  If any biopsies were taken you will be contacted by phone or by letter within the next 1-3 weeks.  Please call us at [phone] if you have not heard about the biopsies in 3 weeks.    SIGNATURES/CONFIDENTIALITY: You and/or your care partner have signed paperwork which will be entered into your electronic medical record.  These signatures attest to the fact that that the information above on your After Visit Summary has been reviewed and is understood.  Full responsibility of the confidentiality of this discharge information lies with you and/or your care-partner.  Read all of the handouts given to  you by your recovery room nurse  Thank-you for choosing [place] for your healthcare needs.

## 2015-06-14 NOTE — Op Note (Signed)
[facility]  [address]. [place], [id number]   COLONOSCOPY PROCEDURE REPORT  PATIENT: Kenneth Bowen, Kenneth Bowen  MR#: [id number] BIRTHDATE: 06-12-1930 , 54  yrs. old GENDER: male ENDOSCOPIST: Ladene Artist, MD, Marval Regal REFERRED BY:  Levin Erp, M.D. PROCEDURE DATE:  06/14/2015 PROCEDURE:   Colonoscopy, diagnostic and Colonoscopy with snare polypectomy First Screening Colonoscopy - Avg.  risk and is 50 yrs.  old or older - No.  Prior Negative Screening - Now for repeat screening. N/A  History of Adenoma - Now for follow-up colonoscopy & has been > or = to 3 yrs.  N/A  Polyps removed today? Yes ASA CLASS:   Class II INDICATIONS:Adenomatous polyp on flex sig. MEDICATIONS: Monitored anesthesia care and Propofol 200 mg IV DESCRIPTION OF PROCEDURE:   After the risks benefits and alternatives of the procedure were thoroughly explained, informed consent was obtained.  The digital rectal exam revealed no abnormalities of the rectum.   The LB [id number]  endoscope was introduced through the anus and advanced to the cecum, which was identified by both the appendix and ileocecal valve. No adverse events experienced.   The quality of the prep was good.  (Suprep was used)  The instrument was then slowly withdrawn as the colon was fully examined. Estimated blood loss is zero unless otherwise noted in this procedure report.    COLON FINDINGS: A sessile polyp measuring 6 mm in size was found in the descending colon.  A polypectomy was performed with a cold snare.  The resection was complete, the polyp tissue was completely retrieved and sent to histology.   There was mild diverticulosis noted in the descending colon and sigmoid colon.   The examination was otherwise normal.  Retroflexed views revealed internal Grade I hemorrhoids. The time to cecum = 4.4 Withdrawal time = 11.3   The scope was withdrawn and the procedure completed. COMPLICATIONS: There were no immediate  complications.  ENDOSCOPIC IMPRESSION: 1.   Sessile polyp in the descending colon; polypectomy performed with a cold snare 2.   Mild diverticulosis in the descending colon and sigmoid colon 3.   Grade l internal hemorrhoids  RECOMMENDATIONS: 1.  Await pathology results 2.  High fiber diet with liberal fluid intake. 3.  Given your age, you will not need another colonoscopy for colon cancer screening or polyp surveillance.  These types of tests usually stop around the age 28.  eSigned:  Ladene Artist, MD, [facility] 06/14/2015 10:46 AM

## 2015-06-14 NOTE — Progress Notes (Signed)
Discharge instructions given to patient by Riki Sheer, LPN.  Good feedback from patient and caregiver.

## 2015-06-14 NOTE — Progress Notes (Signed)
Report to PACU, RN, vss, BBS= Clear.  

## 2015-06-14 NOTE — Progress Notes (Signed)
Called to room to assist during endoscopic procedure.  Patient ID and intended procedure confirmed with present staff. Received instructions for my participation in the procedure from the performing physician.  

## 2015-06-15 ENCOUNTER — Telehealth: Payer: Self-pay | Admitting: *Deleted

## 2015-06-15 NOTE — Telephone Encounter (Signed)
  Follow up Call-  Call back number 06/14/2015 03/10/2015  Post procedure Call Back phone  # [phone]  Permission to leave phone message Yes Yes     Patient questions:  Do you have a fever, pain , or abdominal swelling? No. Pain Score  0 *  Have you tolerated food without any problems? Yes.    Have you been able to return to your normal activities? Yes.    Do you have any questions about your discharge instructions: Diet   No. Medications  No. Follow up visit  No.  Do you have questions or concerns about your Care? No.  Actions: * If pain score is 4 or above: No action needed, pain <4.

## 2015-06-21 ENCOUNTER — Encounter: Payer: Self-pay | Admitting: Gastroenterology

## 2015-09-20 ENCOUNTER — Telehealth: Payer: Self-pay

## 2015-09-20 NOTE — Telephone Encounter (Signed)
Error

## 2015-11-07 ENCOUNTER — Other Ambulatory Visit: Payer: Self-pay | Admitting: Internal Medicine

## 2015-11-07 ENCOUNTER — Ambulatory Visit
Admission: RE | Admit: 2015-11-07 | Discharge: 2015-11-07 | Disposition: A | Discharge status: Home / Self Care | Payer: Medicare Other | Source: Ambulatory Visit | Attending: Internal Medicine | Admitting: Internal Medicine

## 2015-11-07 DIAGNOSIS — Z9181 History of falling: Secondary | ICD-10-CM

## 2016-03-13 ENCOUNTER — Ambulatory Visit
Admission: RE | Admit: 2016-03-13 | Discharge: 2016-03-13 | Disposition: A | Discharge status: Home / Self Care | Payer: Medicare Other | Source: Ambulatory Visit | Attending: Internal Medicine | Admitting: Internal Medicine

## 2016-03-13 ENCOUNTER — Other Ambulatory Visit: Payer: Self-pay | Admitting: Internal Medicine

## 2016-03-13 DIAGNOSIS — R059 Cough, unspecified: Secondary | ICD-10-CM

## 2016-03-13 DIAGNOSIS — R05 Cough: Secondary | ICD-10-CM

## 2016-03-21 ENCOUNTER — Other Ambulatory Visit: Payer: Self-pay | Admitting: Radiation Oncology

## 2016-03-21 DIAGNOSIS — C61 Malignant neoplasm of prostate: Secondary | ICD-10-CM

## 2016-04-10 ENCOUNTER — Other Ambulatory Visit: Payer: Self-pay | Admitting: Internal Medicine

## 2016-04-10 DIAGNOSIS — R601 Generalized edema: Secondary | ICD-10-CM

## 2016-04-16 ENCOUNTER — Ambulatory Visit
Admission: RE | Admit: 2016-04-16 | Discharge: 2016-04-16 | Disposition: A | Discharge status: Home / Self Care | Payer: Medicare Other | Source: Ambulatory Visit | Attending: Internal Medicine | Admitting: Internal Medicine

## 2016-04-16 DIAGNOSIS — R601 Generalized edema: Secondary | ICD-10-CM

## 2016-07-12 ENCOUNTER — Observation Stay (HOSPITAL_COMMUNITY)
Admission: EM | Admit: 2016-07-12 | Discharge: 2016-07-13 | Disposition: A | Discharge status: Home / Self Care | Payer: Medicare Other | Attending: Internal Medicine | Admitting: Internal Medicine

## 2016-07-12 ENCOUNTER — Emergency Department (HOSPITAL_COMMUNITY): Payer: Medicare Other

## 2016-07-12 ENCOUNTER — Encounter (HOSPITAL_COMMUNITY): Payer: Self-pay | Admitting: Emergency Medicine

## 2016-07-12 DIAGNOSIS — E559 Vitamin D deficiency, unspecified: Secondary | ICD-10-CM | POA: Insufficient documentation

## 2016-07-12 DIAGNOSIS — Z8546 Personal history of malignant neoplasm of prostate: Secondary | ICD-10-CM | POA: Insufficient documentation

## 2016-07-12 DIAGNOSIS — R9089 Other abnormal findings on diagnostic imaging of central nervous system: Secondary | ICD-10-CM | POA: Diagnosis not present

## 2016-07-12 DIAGNOSIS — N400 Enlarged prostate without lower urinary tract symptoms: Secondary | ICD-10-CM | POA: Insufficient documentation

## 2016-07-12 DIAGNOSIS — F329 Major depressive disorder, single episode, unspecified: Secondary | ICD-10-CM | POA: Diagnosis not present

## 2016-07-12 DIAGNOSIS — R4781 Slurred speech: Secondary | ICD-10-CM | POA: Insufficient documentation

## 2016-07-12 DIAGNOSIS — R51 Headache: Secondary | ICD-10-CM | POA: Insufficient documentation

## 2016-07-12 DIAGNOSIS — R569 Unspecified convulsions: Secondary | ICD-10-CM

## 2016-07-12 DIAGNOSIS — G451 Carotid artery syndrome (hemispheric): Secondary | ICD-10-CM | POA: Diagnosis not present

## 2016-07-12 DIAGNOSIS — J019 Acute sinusitis, unspecified: Secondary | ICD-10-CM | POA: Insufficient documentation

## 2016-07-12 DIAGNOSIS — G319 Degenerative disease of nervous system, unspecified: Secondary | ICD-10-CM | POA: Insufficient documentation

## 2016-07-12 DIAGNOSIS — F419 Anxiety disorder, unspecified: Secondary | ICD-10-CM | POA: Diagnosis not present

## 2016-07-12 DIAGNOSIS — Z7982 Long term (current) use of aspirin: Secondary | ICD-10-CM | POA: Diagnosis not present

## 2016-07-12 DIAGNOSIS — R4182 Altered mental status, unspecified: Secondary | ICD-10-CM | POA: Diagnosis present

## 2016-07-12 DIAGNOSIS — I6521 Occlusion and stenosis of right carotid artery: Secondary | ICD-10-CM | POA: Diagnosis not present

## 2016-07-12 DIAGNOSIS — G459 Transient cerebral ischemic attack, unspecified: Secondary | ICD-10-CM | POA: Diagnosis present

## 2016-07-12 DIAGNOSIS — Z923 Personal history of irradiation: Secondary | ICD-10-CM | POA: Diagnosis not present

## 2016-07-12 DIAGNOSIS — Z79899 Other long term (current) drug therapy: Secondary | ICD-10-CM | POA: Diagnosis not present

## 2016-07-12 DIAGNOSIS — H538 Other visual disturbances: Secondary | ICD-10-CM | POA: Insufficient documentation

## 2016-07-12 DIAGNOSIS — I700 Atherosclerosis of aorta: Secondary | ICD-10-CM | POA: Insufficient documentation

## 2016-07-12 HISTORY — DX: Unspecified convulsions: R56.9

## 2016-07-12 LAB — CBC
HCT: 37.6 % — ABNORMAL LOW (ref 39.0–52.0)
HEMATOCRIT: 35.3 % — AB (ref 39.0–52.0)
HEMOGLOBIN: 11.7 g/dL — AB (ref 13.0–17.0)
Hemoglobin: 12.3 g/dL — ABNORMAL LOW (ref 13.0–17.0)
MCH: 31.4 pg (ref 26.0–34.0)
MCH: 31.7 pg (ref 26.0–34.0)
MCHC: 32.7 g/dL (ref 30.0–36.0)
MCHC: 33.1 g/dL (ref 30.0–36.0)
MCV: 95.9 fL (ref 78.0–100.0)
MCV: 95.7 fL (ref 78.0–100.0)
PLATELETS: 264 10*3/uL (ref 150–400)
Platelets: 263 10*3/uL (ref 150–400)
RBC: 3.92 MIL/uL — AB (ref 4.22–5.81)
RBC: 3.69 MIL/uL — ABNORMAL LOW (ref 4.22–5.81)
RDW: 12 % (ref 11.5–15.5)
RDW: 11.8 % (ref 11.5–15.5)
WBC: 7.4 10*3/uL (ref 4.0–10.5)
WBC: 7.6 10*3/uL (ref 4.0–10.5)

## 2016-07-12 LAB — COMPREHENSIVE METABOLIC PANEL
ALK PHOS: 62 U/L (ref 38–126)
ALT: 15 U/L — ABNORMAL LOW (ref 17–63)
ANION GAP: 7 (ref 5–15)
AST: 20 U/L (ref 15–41)
Albumin: 3.7 g/dL (ref 3.5–5.0)
BUN: 13 mg/dL (ref 6–20)
CALCIUM: 9.1 mg/dL (ref 8.9–10.3)
CO2: 25 mmol/L (ref 22–32)
Chloride: 101 mmol/L (ref 101–111)
Creatinine, Ser: 0.84 mg/dL (ref 0.61–1.24)
Glucose, Bld: 110 mg/dL — ABNORMAL HIGH (ref 65–99)
Potassium: 4.3 mmol/L (ref 3.5–5.1)
SODIUM: 133 mmol/L — AB (ref 135–145)
TOTAL PROTEIN: 6.6 g/dL (ref 6.5–8.1)
Total Bilirubin: 0.6 mg/dL (ref 0.3–1.2)

## 2016-07-12 LAB — APTT: aPTT: 31 seconds (ref 24–36)

## 2016-07-12 LAB — DIFFERENTIAL
Basophils Absolute: 0 10*3/uL (ref 0.0–0.1)
Basophils Relative: 0 %
EOS PCT: 3 %
Eosinophils Absolute: 0.3 10*3/uL (ref 0.0–0.7)
LYMPHS PCT: 19 %
Lymphs Abs: 1.4 10*3/uL (ref 0.7–4.0)
MONO ABS: 1 10*3/uL (ref 0.1–1.0)
Monocytes Relative: 13 %
Neutro Abs: 4.7 10*3/uL (ref 1.7–7.7)
Neutrophils Relative %: 65 %

## 2016-07-12 LAB — I-STAT CHEM 8, ED
BUN: 17 mg/dL (ref 6–20)
CALCIUM ION: 1.16 mmol/L (ref 1.15–1.40)
Chloride: 99 mmol/L — ABNORMAL LOW (ref 101–111)
Creatinine, Ser: 0.9 mg/dL (ref 0.61–1.24)
GLUCOSE: 105 mg/dL — AB (ref 65–99)
HCT: 39 % (ref 39.0–52.0)
HEMOGLOBIN: 13.3 g/dL (ref 13.0–17.0)
POTASSIUM: 4.3 mmol/L (ref 3.5–5.1)
Sodium: 135 mmol/L (ref 135–145)
TCO2: 30 mmol/L (ref 0–100)

## 2016-07-12 LAB — CREATININE, SERUM: Creatinine, Ser: 0.83 mg/dL (ref 0.61–1.24)

## 2016-07-12 LAB — CBG MONITORING, ED: GLUCOSE-CAPILLARY: 87 mg/dL (ref 65–99)

## 2016-07-12 LAB — PROTIME-INR
INR: 1.11
PROTHROMBIN TIME: 14.4 s (ref 11.4–15.2)

## 2016-07-12 LAB — I-STAT TROPONIN, ED: Troponin i, poc: 0 ng/mL (ref 0.00–0.08)

## 2016-07-12 MED ORDER — OMEGA-3-ACID ETHYL ESTERS 1 G PO CAPS
1.0000 g | ORAL_CAPSULE | Freq: Every day | ORAL | Status: DC
  Administered 2016-07-13: 1 g via ORAL
  Filled 2016-07-12: qty 1

## 2016-07-12 MED ORDER — VITAMIN D 1000 UNITS PO TABS
1000.0000 [IU] | ORAL_TABLET | Freq: Every day | ORAL | Status: DC
  Administered 2016-07-13: 1000 [IU] via ORAL
  Filled 2016-07-12: qty 1

## 2016-07-12 MED ORDER — ATORVASTATIN CALCIUM 40 MG PO TABS
40.0000 mg | ORAL_TABLET | Freq: Every day | ORAL | Status: DC
  Filled 2016-07-12: qty 1

## 2016-07-12 MED ORDER — ONDANSETRON HCL 4 MG/2ML IJ SOLN: 4.0000 mg | Freq: Four times a day (QID) | INTRAMUSCULAR | Status: DC | PRN

## 2016-07-12 MED ORDER — ADULT MULTIVITAMIN W/MINERALS CH
1.0000 | ORAL_TABLET | Freq: Every day | ORAL | Status: DC
  Administered 2016-07-13: 1 via ORAL
  Filled 2016-07-12 (×2): qty 1

## 2016-07-12 MED ORDER — SODIUM CHLORIDE 0.9% FLUSH
3.0000 mL | Freq: Two times a day (BID) | INTRAVENOUS | Status: DC
  Administered 2016-07-12: 3 mL via INTRAVENOUS

## 2016-07-12 MED ORDER — CALCIUM CARBONATE-VITAMIN D 500-200 MG-UNIT PO TABS
1.0000 | ORAL_TABLET | Freq: Every day | ORAL | Status: DC
  Administered 2016-07-13: 09:00:00 1 via ORAL
  Filled 2016-07-12: qty 1

## 2016-07-12 MED ORDER — TRAZODONE HCL 50 MG PO TABS
50.0000 mg | ORAL_TABLET | Freq: Every day | ORAL | Status: DC
  Administered 2016-07-12: 50 mg via ORAL
  Filled 2016-07-12: qty 1

## 2016-07-12 MED ORDER — ACETAMINOPHEN 650 MG RE SUPP
650.0000 mg | Freq: Four times a day (QID) | RECTAL | Status: DC | PRN
Start: 2016-07-12 — End: 2016-07-13

## 2016-07-12 MED ORDER — ENOXAPARIN SODIUM 40 MG/0.4ML ~~LOC~~ SOLN
40.0000 mg | SUBCUTANEOUS | Status: DC
  Administered 2016-07-12: 40 mg via SUBCUTANEOUS
  Filled 2016-07-12: qty 0.4

## 2016-07-12 MED ORDER — LORATADINE 10 MG PO TABS
10.0000 mg | ORAL_TABLET | Freq: Every day | ORAL | Status: DC
  Administered 2016-07-13: 10 mg via ORAL
  Filled 2016-07-12: qty 1

## 2016-07-12 MED ORDER — AMOXICILLIN-POT CLAVULANATE 875-125 MG PO TABS
1.0000 | ORAL_TABLET | Freq: Two times a day (BID) | ORAL | Status: DC
  Administered 2016-07-13: 1 via ORAL
  Filled 2016-07-12: qty 1

## 2016-07-12 MED ORDER — SODIUM CHLORIDE 0.9 % IV SOLN: 250.0000 mL | INTRAVENOUS | Status: DC | PRN

## 2016-07-12 MED ORDER — TAMSULOSIN HCL 0.4 MG PO CAPS
0.4000 mg | ORAL_CAPSULE | Freq: Every day | ORAL | Status: DC
  Administered 2016-07-12: 0.4 mg via ORAL
  Filled 2016-07-12: qty 1

## 2016-07-12 MED ORDER — SODIUM CHLORIDE 0.9% FLUSH: 3.0000 mL | Freq: Two times a day (BID) | INTRAVENOUS | Status: DC

## 2016-07-12 MED ORDER — SODIUM CHLORIDE 0.9% FLUSH: 3.0000 mL | INTRAVENOUS | Status: DC | PRN

## 2016-07-12 MED ORDER — ONDANSETRON HCL 4 MG PO TABS: 4.0000 mg | ORAL_TABLET | Freq: Four times a day (QID) | ORAL | Status: DC | PRN

## 2016-07-12 MED ORDER — ASPIRIN 81 MG PO CHEW
324.0000 mg | CHEWABLE_TABLET | Freq: Once | ORAL | Status: DC
  Filled 2016-07-12: qty 4

## 2016-07-12 MED ORDER — ACETAMINOPHEN 325 MG PO TABS: 650.0000 mg | ORAL_TABLET | Freq: Four times a day (QID) | ORAL | Status: DC | PRN

## 2016-07-12 MED ORDER — ALPRAZOLAM 0.5 MG PO TABS
1.0000 mg | ORAL_TABLET | Freq: Every day | ORAL | Status: DC
  Administered 2016-07-12: 1 mg via ORAL
  Filled 2016-07-12: qty 2

## 2016-07-12 MED ORDER — STROKE: EARLY STAGES OF RECOVERY BOOK
Freq: Once | Status: DC
  Filled 2016-07-12: qty 1

## 2016-07-12 NOTE — ED Provider Notes (Addendum)
[facility] DEPT Provider Note   CSN: [id number] Arrival date & time: 07/12/16  1409   An emergency department physician performed an initial assessment on this suspected stroke patient at 1445.  History   Chief Complaint Chief Complaint  Patient presents with  . Code Stroke    HPI Kenneth Bowen is a 83 y.o. male.  The history is provided by the patient and a relative.  Altered Mental Status   This is a new problem. The current episode started 1 to 2 hours ago. The problem has been rapidly improving. Associated symptoms include confusion. Associated symptoms comments: Slurred speech, blurred vision. His past medical history does not include CVA, dementia or head trauma.  Neurologic Problem  This is a new problem. Associated symptoms include headaches. Nothing aggravates the symptoms. Nothing relieves the symptoms. He has tried nothing for the symptoms.    Past Medical History:  Diagnosis Date  . Colon polyps   . Diverticulosis   . Prostate cancer [facility]) 2015   radiation finished Nov 2015    Patient Active Problem List   Diagnosis Date Noted  . Abnormal digital rectal exam 01/03/2015  . Prostate cancer ([place]) 12/30/2013  . DIVERTICULOSIS OF COLON 06/06/2010  . RECTAL BLEEDING 06/06/2010  . History of colonic polyps 06/06/2010    Past Surgical History:  Procedure Laterality Date  . INGUINAL HERNIA REPAIR    . KNEE SURGERY    . PROSTATE BIOPSY         Home Medications    Prior to Admission medications   Medication Sig Start Date End Date Taking? Authorizing Provider  ALPRAZolam Duanne Moron) 1 MG tablet Take 1 mg by mouth at bedtime as needed for anxiety.    Historical Provider, MD  aspirin 81 MG tablet Take 81 mg by mouth daily. Every other day    Historical Provider, MD  benzonatate (TESSALON) 200 MG capsule Take 200 mg by mouth 3 (three) times daily as needed for cough.    Historical Provider, MD  cetirizine (ZYRTEC) 10 MG tablet Take 10 mg by mouth daily.     Historical Provider, MD  cholecalciferol (VITAMIN D) 1000 UNITS tablet Take 1,000 Units by mouth daily.    Historical Provider, MD  Multiple Vitamin (MULTIVITAMIN) tablet Take 1 tablet by mouth daily.    Historical Provider, MD  tamsulosin (FLOMAX) 0.4 MG CAPS capsule TAKE 1 CAPSULE (0.4 MG TOTAL) BY MOUTH DAILY AFTER SUPPER. 03/21/16   Tyler Pita, MD    Family History Family History  Problem Relation Age of Onset  . Alcoholism Father   . Cancer Neg Hx   . Colon cancer Neg Hx     Social History Social History  Substance Use Topics  . Smoking status: Never Smoker  . Smokeless tobacco: Never Used  . Alcohol use 4.2 oz/week    7 Glasses of wine per week     Comment: 4 oz nightly     Allergies   Patient has no known allergies.   Review of Systems Review of Systems  Neurological: Positive for headaches.  Psychiatric/Behavioral: Positive for confusion.  All other systems reviewed and are negative.    Physical Exam Updated Vital Signs BP 127/96   Pulse 62   Temp 98.8 F (37.1 C) (Oral)   Resp 19   Ht 5' 10.5" (1.791 m)   Wt 181 lb (82.1 kg)   SpO2 97%   BMI 25.60 kg/m   Physical Exam  Constitutional: He is oriented to person, place, and  time. He appears well-developed and well-nourished. No distress.  HENT:  Head: Normocephalic and atraumatic.  Nose: Nose normal.  Mouth/Throat: Oropharynx is clear and moist.  Eyes: Conjunctivae are normal.  Neck: Neck supple. No tracheal deviation present.  Cardiovascular: Normal rate, regular rhythm and normal heart sounds.   Pulmonary/Chest: Effort normal and breath sounds normal. No respiratory distress.  Abdominal: Soft. He exhibits no distension.  Neurological: He is alert and oriented to person, place, and time. No cranial nerve deficit or sensory deficit. Coordination normal.  Skin: Skin is warm and dry.  Psychiatric: He has a normal mood and affect.  Vitals reviewed.    ED Treatments / Results  Labs (all labs  ordered are listed, but only abnormal results are displayed) Labs Reviewed  CBC - Abnormal; Notable for the following:       Result Value   RBC 3.92 (*)    Hemoglobin 12.3 (*)    HCT 37.6 (*)    All other components within normal limits  COMPREHENSIVE METABOLIC PANEL - Abnormal; Notable for the following:    Sodium 133 (*)    Glucose, Bld 110 (*)    ALT 15 (*)    All other components within normal limits  I-STAT CHEM 8, ED - Abnormal; Notable for the following:    Chloride 99 (*)    Glucose, Bld 105 (*)    All other components within normal limits  PROTIME-INR  APTT  DIFFERENTIAL  I-STAT TROPOININ, ED  CBG MONITORING, ED    EKG  EKG Interpretation  Date/Time:  Thursday July 12 2016 14:31:13 EST Ventricular Rate:  57 PR Interval:    QRS Duration: 93 QT Interval:  410 QTC Calculation: 400 R Axis:   87 Text Interpretation:  Sinus rhythm Multiple premature complexes, vent & supraven Prolonged PR interval Consider left atrial enlargement Borderline right axis deviation No previous tracing Confirmed by Devinn Voshell MD, Tenille Morrill [id number]) on 07/12/2016 3:06:54 PM       Radiology Ct Head Wo Contrast  Result Date: 07/12/2016 CLINICAL DATA:  Stroke symptoms EXAM: CT HEAD WITHOUT CONTRAST TECHNIQUE: Contiguous axial images were obtained from the base of the skull through the vertex without intravenous contrast. COMPARISON:  None. FINDINGS: Brain: No intracranial hemorrhage, mass effect or midline shift. Mild cerebral atrophy. There is periventricular and patchy subcortical white matter decreased attenuation probable due to chronic small vessel ischemic changes. No definite acute cortical infarction. No mass lesion is noted on this unenhanced scan. There is focal area of decreased attenuation just lateral to left caudate nucleus measures about 1 cm medial aspect of internal capsule. Evolving ischemia cannot be excluded. Further correlation with MRI is recommended. Vascular: Atherosclerotic  calcifications of carotid siphon. Skull: No skull fracture Sinuses/Orbits: There is mucosal thickening with air-fluid level in right maxillary sinus. Acute sinusitis cannot be excluded. Other: None IMPRESSION: Mild cerebral atrophy. No intracranial hemorrhage. No mass effect. There is periventricular and patchy subcortical white matter decreased attenuation probable due to chronic small vessel ischemic changes. No definite acute cortical infarction. No mass lesion is noted on this unenhanced scan. There is focal area of decreased attenuation just lateral to left caudate nucleus measures about 1 cm medial aspect of internal capsule. Evolving ischemia cannot be excluded. Further correlation with MRI is recommended. There is mucosal thickening with air-fluid level in left maxillary sinus. Acute sinusitis cannot be excluded. These results were called by telephone at the time of interpretation on 07/12/2016 at 2:36 pm to Dr. Erlinda Hong, who verbally acknowledged  these results. Electronically Signed   By: Lahoma Crocker M.D.   On: 07/12/2016 14:37    Procedures Procedures (including critical care time)  Medications Ordered in ED Medications - No data to display   Initial Impression / Assessment and Plan / ED Course  I have reviewed the triage vital signs and the nursing notes.  Pertinent labs & imaging results that were available during my care of the patient were reviewed by me and considered in my medical decision making (see chart for details).     A 83 year old male presents as a code stroke after having an episode where he had some slurred speech at home. He tells me after arrival and he also had some blurred vision. Symptoms appear to have resolved. CT on arrival has questionable hypodense area concerning for infarct. Neurology recommending admission for completion of stroke workup for what clinically appears to be a TIA. Patient maintaining his airway appropriately, stroke score is low. Pt doesn't qualify for  TPA administration and currently appears to be resolving.  Hospitalist was consulted for admission and will see the patient in the emergency department.   Final Clinical Impressions(s) / ED Diagnoses   Final diagnoses:  Transient cerebral ischemia, unspecified type    New Prescriptions New Prescriptions   No medications on file     Leo Grosser, MD 07/12/16 1809    Leo Grosser, MD 07/12/16 1810

## 2016-07-12 NOTE — Progress Notes (Signed)
Patient arrived to the floor from ED. Denies pain, requesting food. Will continue to monitor.

## 2016-07-12 NOTE — H&P (Signed)
History and Physical    Kenneth Bowen [id number] DOB: Oct 02, 1930 DOA: 07/12/2016  PCP: Criselda Peaches, MD    Patient coming from: Home    Chief Complaint: Confusion.   HPI: Kenneth Bowen is a 83 y.o. male with medical history significant of BPH who presents with acute focal neurologic deficit. Today he bent over to reach an object on the floor, after this maneuver he felt lightheaded, symptom that persisted after him sitting down, it was moderate in intensity, no improving or worsening factors, associated with mild headache. He noticed that he couldn't find words or answer to simple questions. His wife who is at the bedside, witnessed the events, apparently the patient was very confused and disoriented. Patient does not recall being confused. EMS was called, symptoms progressively improved, by time he reached the emergency department he was asymptomatic  ED Course: Patient was found to be nonfocal, neurology was consulted, patient was referred for further admission and hospitalization.   Review of Systems:  1. General, no fevers or chills 2. ENT no runny nose or sore throat 3. Pulmonary no shortness of breath or hemoptysis 4. Cardiovascular. No angina or claudication 5. Gastrointestinal no nausea vomiting or diarrhea 6. Dermatology no rashes 7. Endocrine a tremors, heat or cold intolerance 8. Urology no dysuria or increased urinary frequency 9. Hematology no easy bruisability 10. Musculoskeletal no joint pain  Past Medical History:  Diagnosis Date  . Colon polyps   . Diverticulosis   . Prostate cancer [facility] 2015   radiation finished Nov 2015    Past Surgical History:  Procedure Laterality Date  . INGUINAL HERNIA REPAIR    . KNEE SURGERY    . PROSTATE BIOPSY       reports that he has never smoked. He has never used smokeless tobacco. He reports that he drinks about 4.2 oz of alcohol per week . He reports that he does not use drugs.  No Known Allergies  Family  History  Problem Relation Age of Onset  . Alcoholism Father   . Cancer Neg Hx   . Colon cancer Neg Hx    Unacceptable: Noncontributory, unremarkable, or negative. Acceptable: Family history reviewed and not pertinent (If you reviewed it)  Prior to Admission medications   Medication Sig Start Date End Date Taking? Authorizing Provider  ALPRAZolam Duanne Moron) 1 MG tablet Take 1 mg by mouth at bedtime.    Yes Historical Provider, MD  amoxicillin-clavulanate (AUGMENTIN) 875-125 MG tablet Take 1 tablet by mouth 2 (two) times daily. 07/03/16  Yes Historical Provider, MD  aspirin 81 MG chewable tablet Chew 324 mg by mouth once.   Yes Historical Provider, MD  aspirin 81 MG tablet Take 81 mg by mouth daily. Every other day   Yes Historical Provider, MD  CALCIUM-VITAMIN D PO Take 1 tablet by mouth daily.   Yes Historical Provider, MD  cetirizine (ZYRTEC) 10 MG tablet Take 10 mg by mouth daily.   Yes Historical Provider, MD  cholecalciferol (VITAMIN D) 1000 UNITS tablet Take 1,000 Units by mouth daily.   Yes Historical Provider, MD  Multiple Vitamin (MULTIVITAMIN) tablet Take 1 tablet by mouth daily.   Yes Historical Provider, MD  Omega-3 Fatty Acids (FISH OIL) 1000 MG CAPS Take 1,000 mg by mouth daily.   Yes Historical Provider, MD  tamsulosin (FLOMAX) 0.4 MG CAPS capsule TAKE 1 CAPSULE (0.4 MG TOTAL) BY MOUTH DAILY AFTER SUPPER. 03/21/16  Yes Tyler Pita, MD  traZODone (DESYREL) 50 MG tablet Take 50  mg by mouth at bedtime. 06/18/16  Yes Historical Provider, MD    Physical Exam: Vitals:   07/12/16 1500 07/12/16 1515 07/12/16 1647 07/12/16 1651  BP: 117/86 121/66 144/68   Pulse: (!) 55 (!) 52    Resp: 20 16 15    Temp:    98.5 F (36.9 C)  TempSrc:      SpO2: 100% 96%    Weight:      Height:        Constitutional: not in pain or dyspnea Vitals:   07/12/16 1500 07/12/16 1515 07/12/16 1647 07/12/16 1651  BP: 117/86 121/66 144/68   Pulse: (!) 55 (!) 52    Resp: 20 16 15    Temp:    98.5 F  (36.9 C)  TempSrc:      SpO2: 100% 96%    Weight:      Height:       Eyes: PERRL, lids and conjunctivae normal Head normocephalic, nose and ears no deformities.  ENMT: Mucous membranes are moist. Posterior pharynx clear of any exudate or lesions.Normal dentition.  Neck: normal, supple, no masses, no thyromegaly Respiratory: clear to auscultation bilaterally, no wheezing, no crackles. Normal respiratory effort. No accessory muscle use.  Cardiovascular: Regular rate and rhythm, no murmurs / rubs / gallops. No extremity edema. 2+ pedal pulses. No carotid bruits.  Abdomen: no tenderness, no masses palpated. No hepatosplenomegaly. Bowel sounds positive.  Musculoskeletal: no clubbing / cyanosis. No joint deformity upper and lower extremities. Good ROM, no contractures. Normal muscle tone.  Skin: no rashes, lesions, ulcers. No induration Neurologic: CN 2-12 grossly intact. Sensation intact, DTR normal. Strength 5/5 in all 4.      Labs on Admission: I have personally reviewed following labs and imaging studies  CBC:  Recent Labs Lab 07/12/16 1410 07/12/16 1418  WBC 7.4  --   NEUTROABS 4.7  --   HGB 12.3* 13.3  HCT 37.6* 39.0  MCV 95.9  --   PLT 264  --    Basic Metabolic Panel:  Recent Labs Lab 07/12/16 1410 07/12/16 1418  NA 133* 135  K 4.3 4.3  CL 101 99*  CO2 25  --   GLUCOSE 110* 105*  BUN 13 17  CREATININE 0.84 0.90  CALCIUM 9.1  --    GFR: Estimated Creatinine Clearance: 65.3 mL/min (by C-G formula based on SCr of 0.9 mg/dL). Liver Function Tests:  Recent Labs Lab 07/12/16 1410  AST 20  ALT 15*  ALKPHOS 62  BILITOT 0.6  PROT 6.6  ALBUMIN 3.7   No results for input(s): LIPASE, AMYLASE in the last 168 hours. No results for input(s): AMMONIA in the last 168 hours. Coagulation Profile:  Recent Labs Lab 07/12/16 1410  INR 1.11   Cardiac Enzymes: No results for input(s): CKTOTAL, CKMB, CKMBINDEX, TROPONINI in the last 168 hours. BNP (last 3  results) No results for input(s): PROBNP in the last 8760 hours. HbA1C: No results for input(s): HGBA1C in the last 72 hours. CBG:  Recent Labs Lab 07/12/16 1431  GLUCAP 87   Lipid Profile: No results for input(s): CHOL, HDL, LDLCALC, TRIG, CHOLHDL, LDLDIRECT in the last 72 hours. Thyroid Function Tests: No results for input(s): TSH, T4TOTAL, FREET4, T3FREE, THYROIDAB in the last 72 hours. Anemia Panel: No results for input(s): VITAMINB12, FOLATE, FERRITIN, TIBC, IRON, RETICCTPCT in the last 72 hours. Urine analysis: No results found for: COLORURINE, APPEARANCEUR, LABSPEC, PHURINE, GLUCOSEU, HGBUR, BILIRUBINUR, KETONESUR, PROTEINUR, UROBILINOGEN, NITRITE, LEUKOCYTESUR  Radiological Exams on Admission: Ct Head  Wo Contrast  Result Date: 07/12/2016 CLINICAL DATA:  Stroke symptoms EXAM: CT HEAD WITHOUT CONTRAST TECHNIQUE: Contiguous axial images were obtained from the base of the skull through the vertex without intravenous contrast. COMPARISON:  None. FINDINGS: Brain: No intracranial hemorrhage, mass effect or midline shift. Mild cerebral atrophy. There is periventricular and patchy subcortical white matter decreased attenuation probable due to chronic small vessel ischemic changes. No definite acute cortical infarction. No mass lesion is noted on this unenhanced scan. There is focal area of decreased attenuation just lateral to left caudate nucleus measures about 1 cm medial aspect of internal capsule. Evolving ischemia cannot be excluded. Further correlation with MRI is recommended. Vascular: Atherosclerotic calcifications of carotid siphon. Skull: No skull fracture Sinuses/Orbits: There is mucosal thickening with air-fluid level in right maxillary sinus. Acute sinusitis cannot be excluded. Other: None IMPRESSION: Mild cerebral atrophy. No intracranial hemorrhage. No mass effect. There is periventricular and patchy subcortical white matter decreased attenuation probable due to chronic small  vessel ischemic changes. No definite acute cortical infarction. No mass lesion is noted on this unenhanced scan. There is focal area of decreased attenuation just lateral to left caudate nucleus measures about 1 cm medial aspect of internal capsule. Evolving ischemia cannot be excluded. Further correlation with MRI is recommended. There is mucosal thickening with air-fluid level in left maxillary sinus. Acute sinusitis cannot be excluded. These results were called by telephone at the time of interpretation on 07/12/2016 at 2:36 pm to Dr. Erlinda Hong, who verbally acknowledged these results. Electronically Signed   By: Lahoma Crocker M.D.   On: 07/12/2016 14:37   Mr Brain Wo Contrast  Result Date: 07/12/2016 CLINICAL DATA:  TIA. EXAM: MRI HEAD WITHOUT CONTRAST TECHNIQUE: Multiplanar, multiecho pulse sequences of the brain and surrounding structures were obtained without intravenous contrast. COMPARISON:  CT head 07/12/2016 FINDINGS: Brain: Negative for acute infarct. Moderate chronic microvascular ischemic changes in the white matter and pons. Small infarcts in the left cerebellum are chronic. Generalized atrophy. Negative for hemorrhage or fluid collection. Negative for mass or edema. Vascular: Normal arterial flow void. Skull and upper cervical spine: Negative Sinuses/Orbits: Air-fluid level left maxillary sinus with diffuse mucosal thickening. Bilateral lens replacement. Other: None IMPRESSION: Negative for acute infarct Atrophy and chronic microvascular ischemic changes in the white matter and pons. Electronically Signed   By: Franchot Gallo M.D.   On: 07/12/2016 16:09    EKG: Independently reviewed. Sinus rhythm with premature atrial complexes and premature ventricular complexes.  Assessment/Plan Active Problems:   TIA (transient ischemic attack)   This is an 83 year old male with no significant past medical history who presents with a focal neurologic deficit consistent with confusion, expressive aphasia, which  was transitory and self-limited, by the time patient patient reached the emergency department he was asymptomatic, on the neurologic examination he is nonfocal. Temperature 98.8, blood pressure 116/78, heart rate 57, respiratory rate 15, oxygen saturation 96%. His lungs are clear to auscultation, heart S1-S2 present rhythmic, abdomen soft, lower extremity no edema, neurologically nonfocal. Sodium 133, potassium 4.3, chloride 101, bicarbonate 25, glucose 110, BUN 13, creatinine 0.84, white count 7.4, hemoglobin 12.3, hematocrit 37.6, platelets 264. CT head with focal area of decreased attenuation just lateral to left caudate nucleus measures about 1 cm medial aspect of internal capsule, evolving ischemia cannot be rule out. MRI negative for acute infarct.   The patient will be admitted to the hospital working diagnosis of transitory ischemic attack.  1. Transitory ischemic attack. Patient will be admitted to  the medical floor with remote telemetry monitor, neuro checks every 2 hours for the first 12 hours, will check a lipid profile, ultrasonography of the carotids, echocardiography, continue full dose aspirin 325 mg for now. Avoid hypotension. Start statin therapy. Follow on neurology recommendations. Physical therapy evaluation and bedside nursing swallow evaluation.   2. BPH. Continue trazodone  3. Vitamin D deficiency. Continue cholecalciferol and oral calcium.  4. Depression, anxiety. Continue trazodone and alprazolam per his home regimen.   5. Sinus infection. Patient will need 1 more day of Augmentin per his home regimen.  DVT prophylaxis: enoxaparin  Code Status: Full  Family Communication: I spoke with patient's wife at the bedside and all questions were addressed.  Disposition Plan: Home  Consults called: Neurology  Admission status:  Inpatient   Ronnita Paz Gerome Apley MD [facility] Pager [phone]  If 7PM-7AM, please contact night-coverage [email or web address] Password  [facility]  07/12/2016, 4:54 PM

## 2016-07-12 NOTE — Code Documentation (Signed)
NIHSS 0; 83 y.o. Male with PMH of prostate cancer and colon polyp. Recent sinus infection and on Abx. He also has mild sinus HA for the last 2 days. Today he has some worsening of HA, and started to feel dizzy and blurry vision and head was not clear. Waited for a while, not getting better. Wife called EMS. Per EMS, pt seemed he knew what he wanted to say but not able to get words out. At [facility] his symptoms resolved. CT negative. MRI pending. tPA not given d/t symptoms resolved. Pt to be TIA alert. Bedside handoff with ED RN Chrislyn

## 2016-07-12 NOTE — ED Notes (Signed)
ED Provider at bedside. 

## 2016-07-12 NOTE — ED Notes (Signed)
Dr. Erlinda Hong, neurologist, at bedside.

## 2016-07-12 NOTE — ED Triage Notes (Signed)
Pt arrives from home via [facility].  EMS reports pt's wife reports pt reported sudden onset HA, dizziness with standing, aphasia with confusion.  EMS reports aphasia with dysphagia, no unilateral weakness or facial droop noted.  EMS reports pt's speech improved en route, no speech deficits noted on pt's arrival.

## 2016-07-12 NOTE — ED Notes (Signed)
This RN transporting patient to MRI.

## 2016-07-12 NOTE — Progress Notes (Signed)
MD notified of patients arrival. 

## 2016-07-12 NOTE — Consult Note (Addendum)
Stroke Neurology Consultation Note  Consult Requested by: Dr. Laneta Simmers  Reason for Consult: code stroke  Consult Date: 07/12/16  The history was obtained from the pt.  During history and examination, all items was obtained unless otherwise noted.  History of Present Illness:  Kenneth Bowen is a 83 y.o. Caucasian male with PMH of prostate cancer and colon polyp presented for code stroke. As per pt, he had recent sinus infection and on Abx and almost finished off Abx. He also has mild sinus HA for the last 2 days. Today he has some worsening of HA, and started to feel dizzy and blurry vision and head was not clear. Waited for a while, not getting better. wife called EMS. On arrival, pt can not repeat sentences well as requested. As per EMS, pt seemed knew what he wanted to say but not able to get words out. LKW 12:30pm. By the time, he got to ER, his symptoms all resolved. Currently NIHSS=0. CT head showed a small hyperintensity at left genu of internal capsule, concerning for punctate bleeding vs. Calcification. Will need MRI for further clarification. Pt denies palpitation, or migraine hx.  LSN: 12:30pm today tPA Given: No: symptoms resolved and CT questioning of hyperintensity at left genu of internal capsule.  Past Medical History:  Diagnosis Date  . Colon polyps   . Diverticulosis   . Prostate cancer [facility]) 2015   radiation finished Nov 2015    Past Surgical History:  Procedure Laterality Date  . INGUINAL HERNIA REPAIR    . KNEE SURGERY    . PROSTATE BIOPSY      Family History  Problem Relation Age of Onset  . Alcoholism Father   . Cancer Neg Hx   . Colon cancer Neg Hx     Social History:  reports that he has never smoked. He has never used smokeless tobacco. He reports that he drinks alcohol. He reports that he does not use drugs.  Allergies: No Known Allergies  No current facility-administered medications on file prior to encounter.    Current Outpatient Prescriptions  on File Prior to Encounter  Medication Sig Dispense Refill  . ALPRAZolam (XANAX) 1 MG tablet Take 1 mg by mouth at bedtime as needed for anxiety.    Marland Kitchen aspirin 81 MG tablet Take 81 mg by mouth daily. Every other day    . benzonatate (TESSALON) 200 MG capsule Take 200 mg by mouth 3 (three) times daily as needed for cough.    . cetirizine (ZYRTEC) 10 MG tablet Take 10 mg by mouth daily.    . cholecalciferol (VITAMIN D) 1000 UNITS tablet Take 1,000 Units by mouth daily.    . Multiple Vitamin (MULTIVITAMIN) tablet Take 1 tablet by mouth daily.    . tamsulosin (FLOMAX) 0.4 MG CAPS capsule TAKE 1 CAPSULE (0.4 MG TOTAL) BY MOUTH DAILY AFTER SUPPER. 30 capsule 5    Review of Systems: A full ROS was attempted today and was able to be performed.  Systems assessed include - Constitutional, Eyes, HENT, Respiratory, Cardiovascular, Gastrointestinal, Genitourinary, Integument/breast, Hematologic/lymphatic, Musculoskeletal, Neurological, Behavioral/Psych, Endocrine,  Allergic/Immunologic - with pertinent responses as per HPI.  Physical Examination: Pulse Rate:  [57] 57 (03/01 1431) Resp:  [15] 15 (03/01 1431) BP: (116)/(78) 116/78 (03/01 1431) SpO2:  [96 %-99 %] 96 % (03/01 1431)  General - The patient's general appearance was thin bulit, well developed, in no apparent distress.   Ophthalmologic - Sharp disc margins OU.   Cardiovascular - Ausculation of the heart  revealed regular rate and rhythm   Mental Status -  Level of arousal and orientation to time, place, and person were intact. Language including expression, naming, repetition, comprehension was assessed and found intact. Attention span and concentration were normal. Fund of Knowledge was assessed and was intact.  Cranial Nerves II - XII - II - Vision intact OU. III, IV, VI - Extraocular movements intact. V - Facial sensation intact bilaterally. VII - Facial movement intact bilaterally. VIII - Hearing & vestibular intact bilaterally. X -  Palate elevates symmetrically. XI - Chin turning & shoulder shrug intact bilaterally. XII - Tongue protrusion intact.  Motor Strength - The patient's strength was normal in all extremities and pronator drift was absent.   Motor Tone & Bulk - Muscle tone was assessed at the neck and appendages and was normal.  Bulk was normal and fasciculations were absent.   Reflexes - The patient's reflexes were normal in all extremities and he had no pathological reflexes.  Sensory - Light touch, temperature/pinprick were assessed and were normal.    Coordination - The patient had normal movements in the hands and feet with no ataxia or dysmetria.  Tremor was absent.  Gait and Station - deferred.  Data Reviewed: I have personally reviewed the radiological images below and agree with the radiology interpretations. Red text is my interpretation  Ct Head Wo Contrast 07/12/2016 IMPRESSION: Mild cerebral atrophy. No intracranial hemorrhage. No mass effect. There is periventricular and patchy subcortical white matter decreased attenuation probable due to chronic small vessel ischemic changes. No definite acute cortical infarction. No mass lesion is noted on this unenhanced scan. There is focal area of decreased attenuation just lateral to left caudate nucleus measures about 1 cm medial aspect of internal capsule. Evolving ischemia cannot be excluded. Further correlation with MRI is recommended. There is mucosal thickening with air-fluid level in left maxillary sinus. Acute sinusitis cannot be excluded. There is hyperdensity at left genu of internal capsule and caudate head, concerning for small ICH vs. Calcification. Will do MRI    Assessment: 83 y.o. male with PMH of prostate cancer and colon polyp presented for code stroke. He developed HA for the last 2 days and worsened for today, and started to have blurry vision, thoughts not clear, not able to repeat sentences as per EMS request. By the time, he got to ER,  his symptoms all resolved. Currently NIHSS=0. CT head showed a small hyperintensity at left genu of internal capsule, concerning for punctate bleeding vs. Calcification. Will need MRI for further clarification. Not tPA candidate due to resolution of symptoms and questioning for hyperdensity on CT. DDx including TIA, small brain bleed, seizure or complicated HA.   Plan: - HgbA1c, fasting lipid panel - MRI head stat - CTA head and neck - PT consult, OT consult, Speech consult - Echocardiogram - recommend medicine admission for further work up - Prophylactic therapy- ASA PTA - on hold pending MRI to rule out ICH - Risk factor modification - Telemetry monitoring - Frequent neuro checks  Thank you for this consultation and allowing [place] to participate in the care of this patient.  Rosalin Hawking, MD PhD Stroke Neurology 07/12/2016 2:51 PM  This patient is critically ill due to code stroke status and at significant risk of neurological worsening, death form brain bleed or recurrent stroke or seizure. This patient's care requires constant monitoring of vital signs, hemodynamics, respiratory and cardiac monitoring, review of multiple databases, neurological assessment, discussion with family, other specialists and medical  decision making of high complexity. I spent 45 minutes of neurocritical care time in the care of this patient.

## 2016-07-13 ENCOUNTER — Observation Stay (HOSPITAL_COMMUNITY): Payer: Medicare Other

## 2016-07-13 ENCOUNTER — Observation Stay (HOSPITAL_BASED_OUTPATIENT_CLINIC_OR_DEPARTMENT_OTHER): Payer: Medicare Other

## 2016-07-13 ENCOUNTER — Ambulatory Visit (HOSPITAL_COMMUNITY): Payer: Medicare Other

## 2016-07-13 ENCOUNTER — Encounter (HOSPITAL_COMMUNITY): Payer: Self-pay | Admitting: Radiology

## 2016-07-13 DIAGNOSIS — G43109 Migraine with aura, not intractable, without status migrainosus: Secondary | ICD-10-CM | POA: Diagnosis not present

## 2016-07-13 DIAGNOSIS — R4182 Altered mental status, unspecified: Secondary | ICD-10-CM | POA: Diagnosis not present

## 2016-07-13 DIAGNOSIS — Z8546 Personal history of malignant neoplasm of prostate: Secondary | ICD-10-CM | POA: Diagnosis not present

## 2016-07-13 DIAGNOSIS — F419 Anxiety disorder, unspecified: Secondary | ICD-10-CM

## 2016-07-13 DIAGNOSIS — G459 Transient cerebral ischemic attack, unspecified: Secondary | ICD-10-CM

## 2016-07-13 LAB — CBC
HEMATOCRIT: 35.9 % — AB (ref 39.0–52.0)
HEMOGLOBIN: 12 g/dL — AB (ref 13.0–17.0)
MCH: 32.1 pg (ref 26.0–34.0)
MCHC: 33.4 g/dL (ref 30.0–36.0)
MCV: 96 fL (ref 78.0–100.0)
Platelets: 220 10*3/uL (ref 150–400)
RBC: 3.74 MIL/uL — ABNORMAL LOW (ref 4.22–5.81)
RDW: 12.3 % (ref 11.5–15.5)
WBC: 6.5 10*3/uL (ref 4.0–10.5)

## 2016-07-13 LAB — ECHOCARDIOGRAM COMPLETE
AO mean calculated velocity dopler: 128 cm/s
AOPV: 0.44 m/s
AOVTI: 41.2 cm
AV Area VTI index: 1.16 cm2/m2
AV Area VTI: 2.18 cm2
AV Area mean vel: 2.41 cm2
AV Mean grad: 9 mmHg
AV Peak grad: 22 mmHg
AV peak Index: 1.07
AV pk vel: 234 cm/s
AVA: 2.36 cm2
AVAREAMEANVIN: 1.19 cm2/m2
AVCELMEANRAT: 0.49
CHL CUP AV VALUE AREA INDEX: 1.16
CHL CUP AV VEL: 2.36
CHL CUP DOP CALC LVOT VTI: 19.8 cm
CHL CUP TV REG PEAK VELOCITY: 293 cm/s
E decel time: 264 msec
E/e' ratio: 6.32
FS: 38 % (ref 28–44)
HEIGHTINCHES: 70.5 in
IVS/LV PW RATIO, ED: 1.08
LA diam end sys: 42 mm
LA diam index: 2.07 cm/m2
LA vol A4C: 56.7 ml
LASIZE: 42 mm
LAVOL: 58.5 mL
LAVOLIN: 28.8 mL/m2
LV E/e' medial: 6.32
LV e' LATERAL: 12.2 cm/s
LVEEAVG: 6.32
LVOT SV: 97 mL
LVOT area: 4.91 cm2
LVOT diameter: 25 mm
LVOTPV: 104 cm/s
LVOTVTI: 0.48 cm
MV Dec: 264
MVPG: 2 mmHg
MVPKAVEL: 84.6 m/s
MVPKEVEL: 77.1 m/s
PW: 8.67 mm — AB (ref 0.6–1.1)
RV LATERAL S' VELOCITY: 21.1 cm/s
RV TAPSE: 31.3 mm
TDI e' lateral: 12.2
TDI e' medial: 8.08
TRMAXVEL: 293 cm/s
WEIGHTICAEL: 2896 [oz_av]

## 2016-07-13 LAB — COMPREHENSIVE METABOLIC PANEL
ALBUMIN: 3.3 g/dL — AB (ref 3.5–5.0)
ALT: 14 U/L — ABNORMAL LOW (ref 17–63)
ANION GAP: 11 (ref 5–15)
AST: 20 U/L (ref 15–41)
Alkaline Phosphatase: 56 U/L (ref 38–126)
BILIRUBIN TOTAL: 0.5 mg/dL (ref 0.3–1.2)
BUN: 11 mg/dL (ref 6–20)
CO2: 22 mmol/L (ref 22–32)
Calcium: 8.5 mg/dL — ABNORMAL LOW (ref 8.9–10.3)
Chloride: 100 mmol/L — ABNORMAL LOW (ref 101–111)
Creatinine, Ser: 0.92 mg/dL (ref 0.61–1.24)
GFR calc Af Amer: >60 mL/min (ref >60)
GFR calc non Af Amer: >60 mL/min (ref >60)
GLUCOSE: 101 mg/dL — AB (ref 65–99)
POTASSIUM: 3.9 mmol/L (ref 3.5–5.1)
Sodium: 133 mmol/L — ABNORMAL LOW (ref 135–145)
TOTAL PROTEIN: 5.7 g/dL — AB (ref 6.5–8.1)

## 2016-07-13 LAB — LIPID PANEL
CHOLESTEROL: 114 mg/dL (ref 0–200)
HDL: 42 mg/dL (ref >40)
LDL Cholesterol: 55 mg/dL (ref 0–99)
Total CHOL/HDL Ratio: 2.7 RATIO
Triglycerides: 85 mg/dL (ref <150)
VLDL: 17 mg/dL (ref 0–40)

## 2016-07-13 MED ORDER — ASPIRIN 81 MG PO TABS: 81.0000 mg | ORAL_TABLET | Freq: Every day | ORAL | 3 refills | Status: DC

## 2016-07-13 MED ORDER — ASPIRIN EC 325 MG PO TBEC
325.0000 mg | DELAYED_RELEASE_TABLET | Freq: Every day | ORAL | Status: DC
  Administered 2016-07-13: 325 mg via ORAL
  Filled 2016-07-13: qty 1

## 2016-07-13 MED ORDER — ASPIRIN EC 81 MG PO TBEC
81.0000 mg | DELAYED_RELEASE_TABLET | Freq: Every day | ORAL | Status: DC
Start: 2016-07-14 — End: 2016-07-13

## 2016-07-13 MED ORDER — IOPAMIDOL (ISOVUE-370) INJECTION 76%
INTRAVENOUS | Status: AC
  Administered 2016-07-13: 50 mL
  Filled 2016-07-13: qty 50

## 2016-07-13 NOTE — Progress Notes (Signed)
OT Cancellation Note  Patient Details Name: Kenneth Bowen MRN: [id number] DOB: 1930-06-02   Cancelled Treatment:    Reason Eval/Treat Not Completed: OT screened, no needs identified, will sign off. Per conversation with PT, pt is at/close to baseline. No acute OT needs identified at this time. OT signing off.  Tyrone Schimke OTR/L Pager: [phone]   07/13/2016, 12:28 PM

## 2016-07-13 NOTE — Care Management Obs Status (Signed)
[place] NOTIFICATION   Patient Details  Name: AQUILLA GODBEE MRN: [id number] Date of Birth: [birth year]   Medicare Observation Status Notification Given:  Yes    Pollie Friar, RN 07/13/2016, 11:12 AM

## 2016-07-13 NOTE — Progress Notes (Signed)
STROKE TEAM PROGRESS NOTE   HISTORY OF PRESENT ILLNESS (per record) Kenneth Bowen is a 83 y.o. Caucasian male with PMH of prostate cancer and colon polyp presented for code stroke. As per pt, he had recent sinus infection and on Abx and almost finished off Abx. He also has mild sinus HA for the last 2 days. Today he has some worsening of HA, and started to feel dizzy and blurry vision and head was not clear. Waited for a while, not getting better. wife called EMS. On arrival, pt can not repeat sentences well as requested. As per EMS, pt seemed knew what he wanted to say but not able to get words out. LKW 12:30pm 07/12/2016. By the time, he got to ER, his symptoms all resolved. Currently NIHSS=0. CT head showed a small hyperintensity at left genu of internal capsule, concerning for punctate bleeding vs. Calcification. Will need MRI for further clarification. Pt denies palpitation, or migraine hx. Patient was not administered IV t-PA secondary to symptoms resolved and CT questioning of hyperintensity at left genu of internal capsule. He was admitted for further evaluation and treatment.   SUBJECTIVE (INTERVAL HISTORY) No family at bedside. Pt comfortable and no complains. All stroke work up negative. EEG cancelled as does not feel his symptoms consistent with seizure. He is eager to go home.    OBJECTIVE Temp:  [98.3 F (36.8 C)-99.2 F (37.3 C)] 99.2 F (37.3 C) (03/02 1259) Pulse Rate:  [52-66] 66 (03/02 1259) Cardiac Rhythm: Heart block (03/02 0711) Resp:  [15-20] 20 (03/02 1259) BP: (98-144)/(50-96) 108/55 (03/02 1259) SpO2:  [91 %-100 %] 98 % (03/02 1259) Weight:  [82.1 kg (181 lb)] 82.1 kg (181 lb) (03/01 1445)  CBC:  Recent Labs Lab 07/12/16 1410  07/12/16 1826 07/13/16 0231  WBC 7.4  --  7.6 6.5  NEUTROABS 4.7  --   --   --   HGB 12.3*  < > 11.7* 12.0*  HCT 37.6*  < > 35.3* 35.9*  MCV 95.9  --  95.7 96.0  PLT 264  --  263 220  < > = values in this interval not  displayed.  Basic Metabolic Panel:  Recent Labs Lab 07/12/16 1410 07/12/16 1418 07/12/16 1826 07/13/16 0231  NA 133* 135  --  133*  K 4.3 4.3  --  3.9  CL 101 99*  --  100*  CO2 25  --   --  22  GLUCOSE 110* 105*  --  101*  BUN 13 17  --  11  CREATININE 0.84 0.90 0.83 0.92  CALCIUM 9.1  --   --  8.5*    Lipid Panel:    Component Value Date/Time   CHOL 114 07/13/2016 0231   TRIG 85 07/13/2016 0231   HDL 42 07/13/2016 0231   CHOLHDL 2.7 07/13/2016 0231   VLDL 17 07/13/2016 0231   LDLCALC 55 07/13/2016 0231   HgbA1c: No results found for: HGBA1C Urine Drug Screen: No results found for: LABOPIA, COCAINSCRNUR, LABBENZ, AMPHETMU, THCU, LABBARB    IMAGING I have personally reviewed the radiological images below and agree with the radiology interpretations.  CT HEAD 07/13/2016 No acute intracranial process. 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   CTA NECK 07/13/2016 Atherosclerosis resulting in 50% stenosis RIGHT internal carotid artery origin.   CTA HEAD 07/13/2016 No emergent large vessel occlusion or severe stenosis.   Mr Brain Wo Contrast 07/12/2016 Negative for acute infarct Atrophy and chronic  microvascular ischemic changes in the white matter and pons.   Ct Head Wo Contrast 07/12/2016 Mild cerebral atrophy. No intracranial hemorrhage. No mass effect. There is periventricular and patchy subcortical white matter decreased attenuation probable due to chronic small vessel ischemic changes. No definite acute cortical infarction. No mass lesion is noted on this unenhanced scan. There is focal area of decreased attenuation just lateral to left caudate nucleus measures about 1 cm medial aspect of internal capsule. Evolving ischemia cannot be excluded. Further correlation with MRI is recommended. There is mucosal thickening with air-fluid level in left maxillary sinus. Acute sinusitis cannot be excluded.   2-D  echocardiogram - Left ventricle: The cavity size was normal. Wall thickness was normal. Systolic function was normal. The estimated ejection fraction was in the range of 55% to 60%. Wall motion was normal; there were no regional wall motion abnormalities. Doppler parameters are consistent with abnormal left ventricular relaxation (grade 1 diastolic dysfunction). - Pulmonary arteries: Systolic pressure was mildly increased. PA peak pressure: 37 mm Hg (S). Impressions:   Normal LV systolic function; grade 1 diastolic dysfunction; calcified aortic valve with no significant AS by doppler; mild TR with mildly elevated pulmonary pressure.   PHYSICAL EXAM  Temp:  [98.3 F (36.8 C)-99.2 F (37.3 C)] 99.2 F (37.3 C) (03/02 1259) Pulse Rate:  [52-66] 66 (03/02 1259) Resp:  [15-20] 20 (03/02 1259) BP: (98-144)/(50-96) 108/55 (03/02 1259) SpO2:  [91 %-100 %] 98 % (03/02 1259) Weight:  [181 lb (82.1 kg)] 181 lb (82.1 kg) (03/01 1445)  General - Well nourished, well developed, in no apparent distress.  Ophthalmologic - Sharp disc margins OU.   Cardiovascular - Regular rate and rhythm.  Mental Status -  Level of arousal and orientation to time, place, and person were intact. Language including expression, naming, repetition, comprehension was assessed and found intact. Attention span and concentration were normal. Fund of Knowledge was assessed and was intact.  Cranial Nerves II - XII - II - Visual field intact OU III, IV, VI - Extraocular movements intact. V - Facial sensation intact bilaterally. VII - Facial movement intact bilaterally. VIII - Hearing & vestibular intact bilaterally. X - Palate elevates symmetrically. XI - Chin turning & shoulder shrug intact bilaterally. XII - Tongue protrusion intact.  Motor Strength - The patient's strength was normal in all extremities and pronator drift was absent.  Bulk was normal and fasciculations were absent.   Motor Tone - Muscle tone was  assessed at the neck and appendages and was normal.  Reflexes - The patient's reflexes were 1+ in all extremities and he had no pathological reflexes.  Sensory - Light touch, temperature/pinprick, vibration and proprioception, and Romberg testing were assessed and were symmetrical.    Coordination - The patient had normal movements in the hands and feet with no ataxia or dysmetria.  Tremor was absent.  Gait and Station - The patient's transfers, posture, gait, station, and turns were observed as normal.   ASSESSMENT/PLAN [name] is a 83 y.o. male with history of prostate cancer and colon polyps presenting with HA, dizzy, blurry vision, confusion and inability to get words out. He did not receive IV t-PA due to symptoms resolved.   Speech difficulty likely due to anxiety vs. Complicated HA, less likely TIA  CT head no acute abnormality. 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 Yes; Fluid consistency: Thin  aspirin 81 mg daily prior to admission, now on aspirin 81 mg daily. Continue ASA 81mg  on discharge.  Patient counseled to be compliant with his antithrombotic medications  Ongoing aggressive stroke risk factor management  Therapy recommendations:  No therapy needs  Disposition:  Return home  Sinusitis with HA  Finished Abx treatment  HA resolved  No further treatment needed  Other Stroke Risk Factors  Advanced age  ETOH use, advised to drink no more than 1 drink(s) a day  Other Active Problems  BPH  Vitamin D deficiency  Depression   Hospital day # 0  Neurology will sign off. Please call with questions. No neuro follow up needed. Thanks for the consult.  Rosalin Hawking, MD PhD Stroke Neurology 07/13/2016 2:44 PM   To contact Stroke Continuity provider,  please refer to [email or web address] After hours, contact General Neurology

## 2016-07-13 NOTE — Patient Instructions (Signed)
Single Leg - Eyes Open    Holding support, lift right leg while maintaining balance over other leg. Progress to removing hands from support surface for longer periods of time. Hold__10-30__ seconds. Repeat __3__ times per session. Do _1___ sessions per day.  Copyright  VHI. All rights reserved.  Feet Heel-Toe "Tandem", Varied Arm Positions - Eyes Open    With eyes open, right foot directly in front of the other, arms out, look straight ahead at a stationary object. Hold __30__ seconds. Repeat _3___ times per session. Do __1__ sessions per day.  Copyright  VHI. All rights reserved.  Figure Eight    Walk in a figure eight pattern. Repeat __5__ times per session. Do __1__ sessions per day.  Copyright  VHI. All rights reserved.

## 2016-07-13 NOTE — Care Management Note (Signed)
Case Management Note  Patient Details  Name: Kenneth Bowen MRN: [id number] Date of Birth: 01-Jan-1931  Subjective/Objective:   Pt in with TIA. He is from home with his spouse.                  Action/Plan: No f/u and no DME needs per PT. Pt disharging home with self care. Pt with insurance and PCP.  Expected Discharge Date:  07/13/16               Expected Discharge Plan:  Home/Self Care  In-House Referral:     Discharge planning Services     Post Acute Care Choice:    Choice offered to:     DME Arranged:    DME Agency:     HH Arranged:    HH Agency:     Status of Service:  Completed, signed off  If discussed at [employer] of Stay Meetings, dates discussed:    Additional Comments:  Pollie Friar, RN 07/13/2016, 2:04 PM

## 2016-07-13 NOTE — Discharge Summary (Signed)
Physician Discharge Summary   Patient ID: Kenneth Bowen MRN: [id number] DOB/AGE: 02/04/1931 83 y.o.  Admit date: 07/12/2016 Discharge date: 07/13/2016  Primary Care Physician:  Criselda Peaches, MD  Discharge Diagnoses:    . TIA (transient ischemic attack)Versus nonspecific neurological deficit   Recent sinus infection   History of prostate cancer    Consults: Neurology, Dr.Xu  Recommendations for Outpatient Follow-up:  1. Please repeat CBC/BMET at next visit   DIET: Heart healthy diet    Allergies:  No Known Allergies   DISCHARGE MEDICATIONS: Current Discharge Medication List    CONTINUE these medications which have CHANGED   Details  aspirin 81 MG tablet Take 1 tablet (81 mg total) by mouth daily. Over the counter Qty: 30 tablet, Refills: 3      CONTINUE these medications which have NOT CHANGED   Details  ALPRAZolam (XANAX) 1 MG tablet Take 1 mg by mouth at bedtime.     amoxicillin-clavulanate (AUGMENTIN) 875-125 MG tablet Take 1 tablet by mouth 2 (two) times daily.    CALCIUM-VITAMIN D PO Take 1 tablet by mouth daily.    cetirizine (ZYRTEC) 10 MG tablet Take 10 mg by mouth daily.    cholecalciferol (VITAMIN D) 1000 UNITS tablet Take 1,000 Units by mouth daily.    Multiple Vitamin (MULTIVITAMIN) tablet Take 1 tablet by mouth daily.    Omega-3 Fatty Acids (FISH OIL) 1000 MG CAPS Take 1,000 mg by mouth daily.    tamsulosin (FLOMAX) 0.4 MG CAPS capsule TAKE 1 CAPSULE (0.4 MG TOTAL) BY MOUTH DAILY AFTER SUPPER. Qty: 30 capsule, Refills: 5   Associated Diagnoses: Prostate cancer ([place])    traZODone (DESYREL) 50 MG tablet Take 50 mg by mouth at bedtime.                   Brief H and P: For complete details please refer to admission H and P, but in brief Kenneth Bowen is a 83 y.o. male with medical history significant of BPH who presents with acute focal neurologic deficit. Today he bent over to reach an object on the floor, after this maneuver he  felt lightheaded, symptom that persisted after him sitting down, it was moderate in intensity, no improving or worsening factors, associated with mild headache. He noticed that he couldn't find words or answer to simple questions. His wife who is at the bedside, witnessed the events, apparently the patient was very confused and disoriented. Patient does not recall being confused. EMS was called, symptoms progressively improved, by time he reached the emergency department he was asymptomatic  Hospital Course:     TIA (transient ischemic attack) vs nonspecific neurological deficit Patient was admitted with initial differential diagnosis of possible TIA. CT of the head showedfocal area of decreased attenuation just lateral to left caudate nucleus measures about 1 cm medial aspect of internal capsule, evolving ischemia cannot be rule out.  MRI of the brain was negative for acute infarct. CT angiogram of the head and neck showed no stenosis or large vessel occlusion. CTA neck showed atherosclerosis resulting in 50% stenosis of the right internal carotid artery The patient was continued on aspirin and, started on statin. Lipid panel showed LDL of 55, PT elevation showed no PT follow-up needed. 2-D echo showed EF of 55-60%, no wall motion abnormalities I discussed in detail with Dr. Erlinda Hong from neurology/stroke service who evaluated the patient did not feel that patient had any stroke. He did not recommend discharging patient on any statins  and recommended continuing aspirin 81 mg daily. Dr. Erlinda Hong felt patient possibly may have had some nonspecific neurological deficit due to recent acute sinusitis. Patient is back to baseline and was cleared to be discharged home by neurology.   Recent sinus infection Patient is still on Augmentin, will continue   Day of Discharge BP (!) 108/55 (BP Location: Left Arm)   Pulse 66   Temp 99.2 F (37.3 C) (Oral)   Resp 20   Ht 5' 10.5" (1.791 m)   Wt 82.1 kg (181 lb)   SpO2  98%   BMI 25.60 kg/m   Physical Exam: General: Alert and awake oriented x3 not in any acute distress. HEENT: anicteric sclera, pupils reactive to light and accommodation CVS: S1-S2 clear no murmur rubs or gallops Chest: clear to auscultation bilaterally, no wheezing rales or rhonchi Abdomen: soft nontender, nondistended, normal bowel sounds Extremities: no cyanosis, clubbing or edema noted bilaterally Neuro: Cranial nerves II-XII intact, no focal neurological deficits   The results of significant diagnostics from this hospitalization (including imaging, microbiology, ancillary and laboratory) are listed below for reference.    LAB RESULTS: Basic Metabolic Panel:  Recent Labs Lab 07/12/16 1410 07/12/16 1418 07/12/16 1826 07/13/16 0231  NA 133* 135  --  133*  K 4.3 4.3  --  3.9  CL 101 99*  --  100*  CO2 25  --   --  22  GLUCOSE 110* 105*  --  101*  BUN 13 17  --  11  CREATININE 0.84 0.90 0.83 0.92  CALCIUM 9.1  --   --  8.5*   Liver Function Tests:  Recent Labs Lab 07/12/16 1410 07/13/16 0231  AST 20 20  ALT 15* 14*  ALKPHOS 62 56  BILITOT 0.6 0.5  PROT 6.6 5.7*  ALBUMIN 3.7 3.3*   No results for input(s): LIPASE, AMYLASE in the last 168 hours. No results for input(s): AMMONIA in the last 168 hours. CBC:  Recent Labs Lab 07/12/16 1410  07/12/16 1826 07/13/16 0231  WBC 7.4  --  7.6 6.5  NEUTROABS 4.7  --   --   --   HGB 12.3*  < > 11.7* 12.0*  HCT 37.6*  < > 35.3* 35.9*  MCV 95.9  --  95.7 96.0  PLT 264  --  263 220  < > = values in this interval not displayed. Cardiac Enzymes: No results for input(s): CKTOTAL, CKMB, CKMBINDEX, TROPONINI in the last 168 hours. BNP: Invalid input(s): POCBNP CBG:  Recent Labs Lab 07/12/16 1431  GLUCAP 87    Significant Diagnostic Studies:  Ct Angio Head W Or Wo Contrast  Result Date: 07/13/2016 CLINICAL DATA:  Follow-up TIA.  History of prostate cancer. EXAM: CT ANGIOGRAPHY HEAD AND NECK TECHNIQUE:  Multidetector CT imaging of the head and neck was performed using the standard protocol during bolus administration of intravenous contrast. Multiplanar CT image reconstructions and MIPs were obtained to evaluate the vascular anatomy. Carotid stenosis measurements (when applicable) are obtained utilizing NASCET criteria, using the distal internal carotid diameter as the denominator. CONTRAST:  50 cc Isovue 370 COMPARISON:  MRI of the head July 12, 2016 FINDINGS: CT HEAD FINDINGS BRAIN: Stable moderate to severe ventriculomegaly with disproportionate mild sulcal effacement at the convexities. No intraparenchymal hemorrhage, mass effect nor midline shift. 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 Patchy supratentorial white matter hypodensities within normal range for patient's age, though non-specific are most compatible with chronic small vessel ischemic disease. No acute large vascular territory infarcts.  No abnormal extra-axial fluid collections. Basal cisterns are patent. VASCULAR: Moderate calcific atherosclerosis of the carotid siphons. SKULL: No skull fracture. Osteopenia. No significant scalp soft tissue swelling. SINUSES/ORBITS: Moderate LEFT maxillary sinus mucosal thickening. Trace chronic LEFT mastoid effusion. Status post bilateral ocular lens implants. The included ocular globes and orbital contents are non-suspicious. OTHER: None. CTA NECK AORTIC ARCH: Normal appearance of the thoracic arch, normal branch pattern. Mild calcific atherosclerosis of the aortic arch. The origins of the innominate, left Common carotid artery and subclavian artery are widely patent. RIGHT CAROTID SYSTEM: Common carotid artery is widely patent, coursing in a straight line fashion. Eccentric intimal thickening and calcific atherosclerosis resulting in 50% stenosis RIGHT internal carotid artery origin by NASCET criteria. Normal appearance of the included internal carotid artery. LEFT CAROTID SYSTEM: Common carotid artery is widely  patent, coursing in a straight line fashion. Normal appearance of the carotid bifurcation without hemodynamically significant stenosis by NASCET criteria, mild eccentric calcific atherosclerosis. Normal appearance of the included internal carotid artery. VERTEBRAL ARTERIES:Left vertebral artery is dominant. Normal appearance of the vertebral arteries, which appear widely patent. Mild extrinsic deformity due to degenerative cervical spine. SKELETON: No acute osseous process though bone windows have not been submitted. Severe LEFT C4-5 and moderate to severe C5-6 and C6-7 neural foraminal narrowing. OTHER NECK: Soft tissues of the neck are non-acute though, not tailored for evaluation. Mild heterogeneous lung attenuation within the apices assisted with pulmonary edema and small airway disease. CTA HEAD ANTERIOR CIRCULATION: Normal appearance of the cervical internal carotid arteries, petrous, cavernous and supra clinoid internal carotid arteries. Widely patent anterior communicating artery. Patent anterior and middle cerebral arteries. No large vessel occlusion, hemodynamically significant stenosis, dissection, luminal irregularity, contrast extravasation or aneurysm. POSTERIOR CIRCULATION: Normal appearance of the vertebral arteries, vertebrobasilar junction and basilar artery, as well as main branch vessels. Patent posterior cerebral arteries. No large vessel occlusion, hemodynamically significant stenosis, dissection, luminal irregularity, contrast extravasation or aneurysm. VENOUS SINUSES: Major dural venous sinuses are patent though not tailored for evaluation on this angiographic examination. ANATOMIC VARIANTS: None. DELAYED PHASE: No abnormal intracranial enhancement. MIP images reviewed. IMPRESSION: CT HEAD: No acute intracranial process. 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 CTA NECK: Atherosclerosis resulting in 50% stenosis RIGHT internal  carotid artery origin. CTA HEAD:  No emergent large vessel occlusion or severe stenosis. Electronically Signed   By: Elon Alas M.D.   On: 07/13/2016 02:03   Ct Head Wo Contrast  Result Date: 07/12/2016 CLINICAL DATA:  Stroke symptoms EXAM: CT HEAD WITHOUT CONTRAST TECHNIQUE: Contiguous axial images were obtained from the base of the skull through the vertex without intravenous contrast. COMPARISON:  None. FINDINGS: Brain: No intracranial hemorrhage, mass effect or midline shift. Mild cerebral atrophy. There is periventricular and patchy subcortical white matter decreased attenuation probable due to chronic small vessel ischemic changes. No definite acute cortical infarction. No mass lesion is noted on this unenhanced scan. There is focal area of decreased attenuation just lateral to left caudate nucleus measures about 1 cm medial aspect of internal capsule. Evolving ischemia cannot be excluded. Further correlation with MRI is recommended. Vascular: Atherosclerotic calcifications of carotid siphon. Skull: No skull fracture Sinuses/Orbits: There is mucosal thickening with air-fluid level in right maxillary sinus. Acute sinusitis cannot be excluded. Other: None IMPRESSION: Mild cerebral atrophy. No intracranial hemorrhage. No mass effect. There is periventricular and patchy subcortical white matter decreased attenuation probable due to chronic small vessel ischemic changes. No definite acute  cortical infarction. No mass lesion is noted on this unenhanced scan. There is focal area of decreased attenuation just lateral to left caudate nucleus measures about 1 cm medial aspect of internal capsule. Evolving ischemia cannot be excluded. Further correlation with MRI is recommended. There is mucosal thickening with air-fluid level in left maxillary sinus. Acute sinusitis cannot be excluded. These results were called by telephone at the time of interpretation on 07/12/2016 at 2:36 pm to Dr. Erlinda Hong, who verbally  acknowledged these results. Electronically Signed   By: Lahoma Crocker M.D.   On: 07/12/2016 14:37   Ct Angio Neck W Or Wo Contrast  Result Date: 07/13/2016 CLINICAL DATA:  Follow-up TIA.  History of prostate cancer. EXAM: CT ANGIOGRAPHY HEAD AND NECK TECHNIQUE: Multidetector CT imaging of the head and neck was performed using the standard protocol during bolus administration of intravenous contrast. Multiplanar CT image reconstructions and MIPs were obtained to evaluate the vascular anatomy. Carotid stenosis measurements (when applicable) are obtained utilizing NASCET criteria, using the distal internal carotid diameter as the denominator. CONTRAST:  50 cc Isovue 370 COMPARISON:  MRI of the head July 12, 2016 FINDINGS: CT HEAD FINDINGS BRAIN: Stable moderate to severe ventriculomegaly with disproportionate mild sulcal effacement at the convexities. No intraparenchymal hemorrhage, mass effect nor midline shift. 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 Patchy supratentorial white matter hypodensities within normal range for patient's age, though non-specific are most compatible with chronic small vessel ischemic disease. No acute large vascular territory infarcts. No abnormal extra-axial fluid collections. Basal cisterns are patent. VASCULAR: Moderate calcific atherosclerosis of the carotid siphons. SKULL: No skull fracture. Osteopenia. No significant scalp soft tissue swelling. SINUSES/ORBITS: Moderate LEFT maxillary sinus mucosal thickening. Trace chronic LEFT mastoid effusion. Status post bilateral ocular lens implants. The included ocular globes and orbital contents are non-suspicious. OTHER: None. CTA NECK AORTIC ARCH: Normal appearance of the thoracic arch, normal branch pattern. Mild calcific atherosclerosis of the aortic arch. The origins of the innominate, left Common carotid artery and subclavian artery are widely patent. RIGHT CAROTID SYSTEM: Common carotid artery is widely patent, coursing in a straight line  fashion. Eccentric intimal thickening and calcific atherosclerosis resulting in 50% stenosis RIGHT internal carotid artery origin by NASCET criteria. Normal appearance of the included internal carotid artery. LEFT CAROTID SYSTEM: Common carotid artery is widely patent, coursing in a straight line fashion. Normal appearance of the carotid bifurcation without hemodynamically significant stenosis by NASCET criteria, mild eccentric calcific atherosclerosis. Normal appearance of the included internal carotid artery. VERTEBRAL ARTERIES:Left vertebral artery is dominant. Normal appearance of the vertebral arteries, which appear widely patent. Mild extrinsic deformity due to degenerative cervical spine. SKELETON: No acute osseous process though bone windows have not been submitted. Severe LEFT C4-5 and moderate to severe C5-6 and C6-7 neural foraminal narrowing. OTHER NECK: Soft tissues of the neck are non-acute though, not tailored for evaluation. Mild heterogeneous lung attenuation within the apices assisted with pulmonary edema and small airway disease. CTA HEAD ANTERIOR CIRCULATION: Normal appearance of the cervical internal carotid arteries, petrous, cavernous and supra clinoid internal carotid arteries. Widely patent anterior communicating artery. Patent anterior and middle cerebral arteries. No large vessel occlusion, hemodynamically significant stenosis, dissection, luminal irregularity, contrast extravasation or aneurysm. POSTERIOR CIRCULATION: Normal appearance of the vertebral arteries, vertebrobasilar junction and basilar artery, as well as main branch vessels. Patent posterior cerebral arteries. No large vessel occlusion, hemodynamically significant stenosis, dissection, luminal irregularity, contrast extravasation or aneurysm. VENOUS SINUSES: Major dural venous sinuses are patent though not tailored  for evaluation on this angiographic examination. ANATOMIC VARIANTS: None. DELAYED PHASE: No abnormal  intracranial enhancement. MIP images reviewed. IMPRESSION: CT HEAD: No acute intracranial process. 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 CTA NECK: Atherosclerosis resulting in 50% stenosis RIGHT internal carotid artery origin. CTA HEAD:  No emergent large vessel occlusion or severe stenosis. Electronically Signed   By: Elon Alas M.D.   On: 07/13/2016 02:03   Mr Brain Wo Contrast  Result Date: 07/12/2016 CLINICAL DATA:  TIA. EXAM: MRI HEAD WITHOUT CONTRAST TECHNIQUE: Multiplanar, multiecho pulse sequences of the brain and surrounding structures were obtained without intravenous contrast. COMPARISON:  CT head 07/12/2016 FINDINGS: Brain: Negative for acute infarct. Moderate chronic microvascular ischemic changes in the white matter and pons. Small infarcts in the left cerebellum are chronic. Generalized atrophy. Negative for hemorrhage or fluid collection. Negative for mass or edema. Vascular: Normal arterial flow void. Skull and upper cervical spine: Negative Sinuses/Orbits: Air-fluid level left maxillary sinus with diffuse mucosal thickening. Bilateral lens replacement. Other: None IMPRESSION: Negative for acute infarct Atrophy and chronic microvascular ischemic changes in the white matter and pons. Electronically Signed   By: Franchot Gallo M.D.   On: 07/12/2016 16:09    2D ECHO: Study Conclusions  - Left ventricle: The cavity size was normal. Wall thickness was   normal. Systolic function was normal. The estimated ejection   fraction was in the range of 55% to 60%. Wall motion was normal;   there were no regional wall motion abnormalities. Doppler   parameters are consistent with abnormal left ventricular   relaxation (grade 1 diastolic dysfunction). - Pulmonary arteries: Systolic pressure was mildly increased. PA   peak pressure: 37 mm Hg (S).  Impressions:  - Normal LV systolic function; grade 1 diastolic  dysfunction;   calcified aortic valve with no significant AS by doppler; mild TR   with mildly elevated pulmonary pressure.   Disposition and Follow-up: Discharge Instructions    Diet - low sodium heart healthy    Complete by:  As directed    Increase activity slowly    Complete by:  As directed        DISPOSITION: home    DISCHARGE FOLLOW-UP Follow-up Information    GREEN, EDWIN JAY, MD. Schedule an appointment as soon as possible for a visit in 2 week(s).   Specialty:  Internal Medicine Contact information: [address] [place] [address] [phone]            Time spent on Discharge: 25 mins   Signed:   Koleton Duchemin M.D. [facility] 07/13/2016, 1:56 PM Pager: [phone]

## 2016-07-13 NOTE — Progress Notes (Signed)
  Echocardiogram 2D Echocardiogram has been performed.  Jennette Dubin 07/13/2016, 9:13 AM

## 2016-07-13 NOTE — Progress Notes (Addendum)
D/C instruction reviewed with patient. Per Neurologist- "all stroke work up is Negative" Patient notes that Neurologist and Attending MD suspects his "sinus infection" was the cause of complains. He will complete his antibiotics dose when he gets home.  No other question at this time. Patient is assisted to family vehicle.

## 2016-07-13 NOTE — Evaluation (Signed)
Physical Therapy Evaluation & Discharge Patient Details Name: Kenneth Bowen MRN: [id number] DOB: 04-24-1931 Today's Date: 07/13/2016   History of Present Illness  Patient is an 83 y/o admitted with speech deficits and some confusion after bending down to reach into back of lower cabinet at home.  CT showed CT head showed a small hyperintensity at left genu of internal capsule, concerning for punctate bleeding vs. Calcification, but MRI was negative for acute changes.   Clinical Impression  Patient presents close to functional baseline and admits to some balance deficits since last year when had a fall affecting his back and R hip.  Educated in balance HEP and discussed stroke warning signs and risk factors.  No further skilled PT needs at this time.  Will sign off.     Follow Up Recommendations No PT follow up    Equipment Recommendations  None recommended by PT    Recommendations for Other Services       Precautions / Restrictions Precautions Precautions: Fall      Mobility  Bed Mobility Overal bed mobility: Modified Independent                Transfers Overall transfer level: Modified independent Equipment used: None                Ambulation/Gait Ambulation/Gait assistance: Independent Ambulation Distance (Feet): 200 Feet Assistive device: None Gait Pattern/deviations: WFL(Within Functional Limits);Decreased stride length;Wide base of support     General Gait Details: some mild deficits with broader base and less trunk rotation  Stairs Stairs: Yes Stairs assistance: Supervision Stair Management: One rail Left;Step to pattern;Forwards Number of Stairs: 10 General stair comments: states and demonstrates he can do step over step (alternating,) but prefers step to sequence and encouraged for safety  Wheelchair Mobility    Modified Rankin (Stroke Patients Only) Modified Rankin (Stroke Patients Only) Pre-Morbid Rankin Score: No significant  disability Modified Rankin: Moderate disability     Balance Overall balance assessment: Needs assistance               Single Leg Stance - Right Leg: 10 Single Leg Stance - Left Leg: 10 Tandem Stance - Right Leg: 20 Tandem Stance - Left Leg: 20         Standardized Balance Assessment Standardized Balance Assessment : Dynamic Gait Index   Dynamic Gait Index Level Surface: Mild Impairment Change in Gait Speed: Mild Impairment Gait with Horizontal Head Turns: Normal Gait with Vertical Head Turns: Mild Impairment Gait and Pivot Turn: Mild Impairment Step Over Obstacle: Normal Step Around Obstacles: Normal Steps: Moderate Impairment Total Score: 18       Pertinent Vitals/Pain Pain Assessment: No/denies pain    Home Living Family/patient expects to be discharged to:: Private residence Living Arrangements: Spouse/significant other Available Help at Discharge: Family Type of Home: House Home Access: Stairs to enter Entrance Stairs-Rails: Left;Right (poles) Entrance Stairs-Number of Steps: 2 Home Layout: Two level;Bed/bath upstairs;Full bath on main level Home Equipment: Walker - 4 wheels;Kasandra Knudsen - single point (wife uses rollator)      Prior Function Level of Independence: Independent         Comments: fell about a year ago getting out at [employer] and has had some balance issues since     Hand Dominance   Dominant Hand: Right    Extremity/Trunk Assessment   Upper Extremity Assessment Upper Extremity Assessment: Overall WFL for tasks assessed    Lower Extremity Assessment Lower Extremity Assessment: Overall WFL for tasks assessed  Cervical / Trunk Assessment Cervical / Trunk Assessment: Other exceptions Cervical / Trunk Exceptions: stiffness noted in lumbar spine  Communication   Communication: No difficulties  Cognition Arousal/Alertness: Awake/alert Behavior During Therapy: WFL for tasks assessed/performed Overall Cognitive Status: Within  Functional Limits for tasks assessed                      General Comments      Exercises Other Exercises Other Exercises: performed balance in SLS x 10+ seconds each leg, tandem stand and figure 8 walking around two boxes; issued all for HEP   Assessment/Plan    PT Assessment Patent does not need any further PT services  PT Problem List         PT Treatment Interventions      PT Goals (Current goals can be found in the Care Plan section)  Acute Rehab PT Goals PT Goal Formulation: All assessment and education complete, DC therapy    Frequency     Barriers to discharge        Co-evaluation               End of Session Equipment Utilized During Treatment: Gait belt Activity Tolerance: Patient tolerated treatment well Patient left: in bed;with call bell/phone within reach   PT Visit Diagnosis: Unsteadiness on feet (R26.81)    Functional Assessment Tool Used: AM-PAC 6 Clicks Basic Mobility Functional Limitation: Mobility: Walking and moving around Mobility: Walking and Moving Around Current Status [id number]): At least 1 percent but less than 20 percent impaired, limited or restricted Mobility: Walking and Moving Around Goal Status [phone]): At least 1 percent but less than 20 percent impaired, limited or restricted Mobility: Walking and Moving Around Discharge Status [phone]): At least 1 percent but less than 20 percent impaired, limited or restricted    Time: 1005-1030 PT Time Calculation (min) (ACUTE ONLY): 25 min   Charges:   PT Evaluation $PT Eval Low Complexity: 1 Procedure PT Treatments $Gait Training: 8-22 mins   PT G Codes:   PT G-Codes **NOT FOR INPATIENT CLASS** Functional Assessment Tool Used: AM-PAC 6 Clicks Basic Mobility Functional Limitation: Mobility: Walking and moving around Mobility: Walking and Moving Around Current Status [id number]): At least 1 percent but less than 20 percent impaired, limited or restricted Mobility: Walking and  Moving Around Goal Status [phone]): At least 1 percent but less than 20 percent impaired, limited or restricted Mobility: Walking and Moving Around Discharge Status [phone]): At least 1 percent but less than 20 percent impaired, limited or restricted     Reginia Naas 07/13/2016, 11:06 AM  Magda Kiel, [facility] 07/13/2016

## 2016-07-14 LAB — HEMOGLOBIN A1C
Hgb A1c MFr Bld: 5.8 % — ABNORMAL HIGH (ref 4.8–5.6)
Mean Plasma Glucose: 120 mg/dL

## 2016-07-28 ENCOUNTER — Emergency Department (HOSPITAL_COMMUNITY): Payer: Medicare Other

## 2016-07-28 ENCOUNTER — Encounter (HOSPITAL_COMMUNITY): Payer: Self-pay

## 2016-07-28 ENCOUNTER — Inpatient Hospital Stay (HOSPITAL_COMMUNITY)
Admission: EM | Admit: 2016-07-28 | Discharge: 2016-07-30 | DRG: 301 | Disposition: A | Discharge status: Home / Self Care | Payer: Medicare Other | Attending: Internal Medicine | Admitting: Internal Medicine

## 2016-07-28 DIAGNOSIS — F419 Anxiety disorder, unspecified: Secondary | ICD-10-CM | POA: Diagnosis not present

## 2016-07-28 DIAGNOSIS — I10 Essential (primary) hypertension: Secondary | ICD-10-CM | POA: Diagnosis present

## 2016-07-28 DIAGNOSIS — Z86010 Personal history of colonic polyps: Secondary | ICD-10-CM

## 2016-07-28 DIAGNOSIS — I459 Conduction disorder, unspecified: Secondary | ICD-10-CM | POA: Diagnosis present

## 2016-07-28 DIAGNOSIS — I739 Peripheral vascular disease, unspecified: Secondary | ICD-10-CM | POA: Diagnosis present

## 2016-07-28 DIAGNOSIS — Z713 Dietary counseling and surveillance: Secondary | ICD-10-CM

## 2016-07-28 DIAGNOSIS — I82891 Chronic embolism and thrombosis of other specified veins: Principal | ICD-10-CM | POA: Diagnosis present

## 2016-07-28 DIAGNOSIS — R402142 Coma scale, eyes open, spontaneous, at arrival to emergency department: Secondary | ICD-10-CM | POA: Diagnosis present

## 2016-07-28 DIAGNOSIS — R4189 Other symptoms and signs involving cognitive functions and awareness: Secondary | ICD-10-CM | POA: Diagnosis not present

## 2016-07-28 DIAGNOSIS — R402362 Coma scale, best motor response, obeys commands, at arrival to emergency department: Secondary | ICD-10-CM | POA: Diagnosis present

## 2016-07-28 DIAGNOSIS — N400 Enlarged prostate without lower urinary tract symptoms: Secondary | ICD-10-CM | POA: Diagnosis present

## 2016-07-28 DIAGNOSIS — R739 Hyperglycemia, unspecified: Secondary | ICD-10-CM | POA: Diagnosis present

## 2016-07-28 DIAGNOSIS — Z811 Family history of alcohol abuse and dependence: Secondary | ICD-10-CM

## 2016-07-28 DIAGNOSIS — R55 Syncope and collapse: Secondary | ICD-10-CM

## 2016-07-28 DIAGNOSIS — R159 Full incontinence of feces: Secondary | ICD-10-CM | POA: Diagnosis not present

## 2016-07-28 DIAGNOSIS — K5790 Diverticulosis of intestine, part unspecified, without perforation or abscess without bleeding: Secondary | ICD-10-CM | POA: Diagnosis present

## 2016-07-28 DIAGNOSIS — R4689 Other symptoms and signs involving appearance and behavior: Secondary | ICD-10-CM

## 2016-07-28 DIAGNOSIS — H538 Other visual disturbances: Secondary | ICD-10-CM | POA: Diagnosis present

## 2016-07-28 DIAGNOSIS — Z8546 Personal history of malignant neoplasm of prostate: Secondary | ICD-10-CM

## 2016-07-28 DIAGNOSIS — I6521 Occlusion and stenosis of right carotid artery: Secondary | ICD-10-CM | POA: Diagnosis present

## 2016-07-28 DIAGNOSIS — Z7982 Long term (current) use of aspirin: Secondary | ICD-10-CM

## 2016-07-28 DIAGNOSIS — R4182 Altered mental status, unspecified: Secondary | ICD-10-CM | POA: Diagnosis present

## 2016-07-28 DIAGNOSIS — Z6825 Body mass index (BMI) 25.0-25.9, adult: Secondary | ICD-10-CM

## 2016-07-28 DIAGNOSIS — Z923 Personal history of irradiation: Secondary | ICD-10-CM

## 2016-07-28 DIAGNOSIS — R402252 Coma scale, best verbal response, oriented, at arrival to emergency department: Secondary | ICD-10-CM | POA: Diagnosis present

## 2016-07-28 DIAGNOSIS — R001 Bradycardia, unspecified: Secondary | ICD-10-CM | POA: Diagnosis present

## 2016-07-28 DIAGNOSIS — Z8673 Personal history of transient ischemic attack (TIA), and cerebral infarction without residual deficits: Secondary | ICD-10-CM

## 2016-07-28 DIAGNOSIS — E663 Overweight: Secondary | ICD-10-CM | POA: Diagnosis present

## 2016-07-28 DIAGNOSIS — E785 Hyperlipidemia, unspecified: Secondary | ICD-10-CM | POA: Diagnosis present

## 2016-07-28 DIAGNOSIS — R569 Unspecified convulsions: Secondary | ICD-10-CM

## 2016-07-28 DIAGNOSIS — R29700 NIHSS score 0: Secondary | ICD-10-CM | POA: Diagnosis present

## 2016-07-28 DIAGNOSIS — Z79899 Other long term (current) drug therapy: Secondary | ICD-10-CM

## 2016-07-28 HISTORY — DX: Other symptoms and signs involving cognitive functions and awareness: R41.89

## 2016-07-28 LAB — DIFFERENTIAL
BASOS ABS: 0 10*3/uL (ref 0.0–0.1)
BASOS PCT: 1 %
EOS ABS: 0.2 10*3/uL (ref 0.0–0.7)
Eosinophils Relative: 4 %
Lymphocytes Relative: 36 %
Lymphs Abs: 2.2 10*3/uL (ref 0.7–4.0)
Monocytes Absolute: 0.4 10*3/uL (ref 0.1–1.0)
Monocytes Relative: 7 %
Neutro Abs: 3.2 10*3/uL (ref 1.7–7.7)
Neutrophils Relative %: 52 %

## 2016-07-28 LAB — I-STAT CHEM 8, ED
BUN: 20 mg/dL (ref 6–20)
CHLORIDE: 101 mmol/L (ref 101–111)
CREATININE: 0.9 mg/dL (ref 0.61–1.24)
Calcium, Ion: 1.18 mmol/L (ref 1.15–1.40)
Glucose, Bld: 159 mg/dL — ABNORMAL HIGH (ref 65–99)
HEMATOCRIT: 39 % (ref 39.0–52.0)
Hemoglobin: 13.3 g/dL (ref 13.0–17.0)
POTASSIUM: 3.8 mmol/L (ref 3.5–5.1)
SODIUM: 136 mmol/L (ref 135–145)
TCO2: 25 mmol/L (ref 0–100)

## 2016-07-28 LAB — URINALYSIS, ROUTINE W REFLEX MICROSCOPIC
Bilirubin Urine: NEGATIVE
GLUCOSE, UA: NEGATIVE mg/dL
Hgb urine dipstick: NEGATIVE
Ketones, ur: NEGATIVE mg/dL
LEUKOCYTES UA: NEGATIVE
NITRITE: NEGATIVE
PH: 5 (ref 5.0–8.0)
Protein, ur: NEGATIVE mg/dL
Specific Gravity, Urine: 1.012 (ref 1.005–1.030)

## 2016-07-28 LAB — COMPREHENSIVE METABOLIC PANEL
ALBUMIN: 3.8 g/dL (ref 3.5–5.0)
ALT: 18 U/L (ref 17–63)
AST: 28 U/L (ref 15–41)
Alkaline Phosphatase: 58 U/L (ref 38–126)
Anion gap: 10 (ref 5–15)
BUN: 18 mg/dL (ref 6–20)
CHLORIDE: 101 mmol/L (ref 101–111)
CO2: 25 mmol/L (ref 22–32)
CREATININE: 0.88 mg/dL (ref 0.61–1.24)
Calcium: 9.5 mg/dL (ref 8.9–10.3)
GFR calc Af Amer: >60 mL/min (ref >60)
GLUCOSE: 163 mg/dL — AB (ref 65–99)
Potassium: 4 mmol/L (ref 3.5–5.1)
Sodium: 136 mmol/L (ref 135–145)
Total Bilirubin: 0.7 mg/dL (ref 0.3–1.2)
Total Protein: 6.5 g/dL (ref 6.5–8.1)

## 2016-07-28 LAB — PROTIME-INR
INR: 1.15
Prothrombin Time: 14.8 seconds (ref 11.4–15.2)

## 2016-07-28 LAB — TROPONIN I: Troponin I: <0.03 ng/mL (ref <0.03)

## 2016-07-28 LAB — TSH: TSH: 2.194 u[IU]/mL (ref 0.350–4.500)

## 2016-07-28 LAB — CBC
HCT: 37.6 % — ABNORMAL LOW (ref 39.0–52.0)
HEMOGLOBIN: 12.6 g/dL — AB (ref 13.0–17.0)
MCH: 32.3 pg (ref 26.0–34.0)
MCHC: 33.5 g/dL (ref 30.0–36.0)
MCV: 96.4 fL (ref 78.0–100.0)
Platelets: 285 10*3/uL (ref 150–400)
RBC: 3.9 MIL/uL — ABNORMAL LOW (ref 4.22–5.81)
RDW: 12.7 % (ref 11.5–15.5)
WBC: 6 10*3/uL (ref 4.0–10.5)

## 2016-07-28 LAB — CBG MONITORING, ED: Glucose-Capillary: 157 mg/dL — ABNORMAL HIGH (ref 65–99)

## 2016-07-28 LAB — APTT: aPTT: 31 seconds (ref 24–36)

## 2016-07-28 LAB — I-STAT TROPONIN, ED: TROPONIN I, POC: 0 ng/mL (ref 0.00–0.08)

## 2016-07-28 MED ORDER — OMEGA-3-ACID ETHYL ESTERS 1 G PO CAPS
1.0000 g | ORAL_CAPSULE | Freq: Every day | ORAL | Status: DC
  Administered 2016-07-28 – 2016-07-30 (×3): 1 g via ORAL
  Filled 2016-07-28 (×3): qty 1

## 2016-07-28 MED ORDER — ADULT MULTIVITAMIN W/MINERALS CH
1.0000 | ORAL_TABLET | Freq: Every day | ORAL | Status: DC
  Administered 2016-07-29 – 2016-07-30 (×2): 1 via ORAL
  Filled 2016-07-28 (×3): qty 1

## 2016-07-28 MED ORDER — ONDANSETRON HCL 4 MG/2ML IJ SOLN: 4.0000 mg | Freq: Four times a day (QID) | INTRAMUSCULAR | Status: DC | PRN

## 2016-07-28 MED ORDER — ALPRAZOLAM 0.5 MG PO TABS
1.0000 mg | ORAL_TABLET | Freq: Every day | ORAL | Status: DC
  Administered 2016-07-28 – 2016-07-30 (×2): 1 mg via ORAL
  Filled 2016-07-28 (×2): qty 2

## 2016-07-28 MED ORDER — ACETAMINOPHEN 650 MG RE SUPP: 650.0000 mg | Freq: Four times a day (QID) | RECTAL | Status: DC | PRN

## 2016-07-28 MED ORDER — ACETAMINOPHEN 325 MG PO TABS: 650.0000 mg | ORAL_TABLET | Freq: Four times a day (QID) | ORAL | Status: DC | PRN

## 2016-07-28 MED ORDER — TAMSULOSIN HCL 0.4 MG PO CAPS
0.4000 mg | ORAL_CAPSULE | Freq: Every day | ORAL | Status: DC
  Administered 2016-07-29 (×2): 0.4 mg via ORAL
  Filled 2016-07-28 (×2): qty 1

## 2016-07-28 MED ORDER — LORATADINE 10 MG PO TABS
10.0000 mg | ORAL_TABLET | Freq: Every day | ORAL | Status: DC
  Administered 2016-07-29 – 2016-07-30 (×2): 10 mg via ORAL
  Filled 2016-07-28 (×2): qty 1

## 2016-07-28 MED ORDER — SODIUM CHLORIDE 0.9% FLUSH
3.0000 mL | Freq: Two times a day (BID) | INTRAVENOUS | Status: DC
  Administered 2016-07-28 – 2016-07-30 (×4): 3 mL via INTRAVENOUS

## 2016-07-28 MED ORDER — VITAMIN D 1000 UNITS PO TABS
1000.0000 [IU] | ORAL_TABLET | Freq: Every day | ORAL | Status: DC
  Administered 2016-07-29 – 2016-07-30 (×2): 1000 [IU] via ORAL
  Filled 2016-07-28 (×2): qty 1

## 2016-07-28 MED ORDER — TRAZODONE HCL 50 MG PO TABS
50.0000 mg | ORAL_TABLET | Freq: Every day | ORAL | Status: DC
  Administered 2016-07-28 – 2016-07-30 (×2): 50 mg via ORAL
  Filled 2016-07-28 (×2): qty 1

## 2016-07-28 MED ORDER — ONDANSETRON HCL 4 MG PO TABS: 4.0000 mg | ORAL_TABLET | Freq: Four times a day (QID) | ORAL | Status: DC | PRN

## 2016-07-28 MED ORDER — ASPIRIN EC 81 MG PO TBEC
81.0000 mg | DELAYED_RELEASE_TABLET | Freq: Every day | ORAL | Status: DC
  Administered 2016-07-29 – 2016-07-30 (×2): 81 mg via ORAL
  Filled 2016-07-28 (×2): qty 1

## 2016-07-28 NOTE — ED Notes (Signed)
Patient is stable and ready to be transport to the floor at this time.  Report was called to [facility] RN.  Belongings taken with the patient to the floor. Patient is in MRI at this time.  If patient returns before [place] will transport to floor.  If not will transport at [place].

## 2016-07-28 NOTE — H&P (Addendum)
History and Physical    Kenneth Bowen [id number] DOB: Jun 23, 1930 DOA: 07/28/2016  Referring MD/NP/PA: Dr. Deno Etienne   PCP: Criselda Peaches, MD   Outpatient Specialists: GI, Dr. Erskine Emery   Patient coming from: home   Chief Complaint:  Found standing against the wall not responding   HPI: Kenneth Bowen is a 83 y.o. male with medical history significant for BPH, dyslipidemia, recent hospitalization for acute neurologic deficit (lightheadedness) and has had TIA work and discharged home 07/13/2016. He presented to [facility] after he was found standing up against the wall and not responding to his wife when she called his name. His wife is not there to provide other details of medical history. Unknown for how long was he unresponsive. He also had fecal incontinence. He has had no recollection of the event that led to this episode of unresponsiveness. No witnessed seizure like activity. No reports of shortness of breath, chest pain or cough. No fevers.   ED Course: Pt was hemodynamically stable. Blood work was relatively unremarkable. CT head showed no acute intracranial findings. MRI brain is pending. He is admitted for evaluation of unresponsiveness.   Review of Systems:  Constitutional: Negative for fever, chills, diaphoresis, activity change, appetite change and fatigue.  HENT: Negative for ear pain, nosebleeds, congestion, facial swelling, rhinorrhea, neck pain, neck stiffness and ear discharge.   Eyes: Negative for pain, discharge, redness, itching and visual disturbance.  Respiratory: Negative for cough, choking, chest tightness, shortness of breath, wheezing and stridor.   Cardiovascular: Negative for chest pain, palpitations and leg swelling.  Gastrointestinal: Negative for abdominal distention.  Genitourinary: Negative for dysuria, urgency, frequency, hematuria, flank pain, decreased urine volume, difficulty urinating and dyspareunia.  Musculoskeletal: Negative for back pain,  joint swelling, arthralgias and gait problem.  Neurological: per HPI Hematological: Negative for adenopathy. Does not bruise/bleed easily.  Psychiatric/Behavioral: Negative for hallucinations, behavioral problems, confusion, dysphoric mood, decreased concentration and agitation.   Past Medical History:  Diagnosis Date  . Colon polyps   . Diverticulosis   . Prostate cancer [facility]) 2015   radiation finished Nov 2015    Past Surgical History:  Procedure Laterality Date  . INGUINAL HERNIA REPAIR    . KNEE SURGERY    . PROSTATE BIOPSY      Social history:  reports that he has never smoked. He has never used smokeless tobacco. He reports that he drinks about 4.2 oz of alcohol per week . He reports that he does not use drugs.  Ambulation: ambulates without assistance at baseline   No Known Allergies  Family History  Problem Relation Age of Onset  . Alcoholism Father   . Cancer Neg Hx   . Colon cancer Neg Hx     Prior to Admission medications   Medication Sig Start Date End Date Taking? Authorizing Provider  ALPRAZolam Duanne Moron) 1 MG tablet Take 1 mg by mouth at bedtime.     Historical Provider, MD  amoxicillin-clavulanate (AUGMENTIN) 875-125 MG tablet Take 1 tablet by mouth 2 (two) times daily. 07/03/16   Historical Provider, MD  aspirin 81 MG tablet Take 1 tablet (81 mg total) by mouth daily. Over the counter 07/13/16   Ripudeep Krystal Eaton, MD  CALCIUM-VITAMIN D PO Take 1 tablet by mouth daily.    Historical Provider, MD  cetirizine (ZYRTEC) 10 MG tablet Take 10 mg by mouth daily.    Historical Provider, MD  cholecalciferol (VITAMIN D) 1000 UNITS tablet Take 1,000 Units by mouth daily.  Historical Provider, MD  Multiple Vitamin (MULTIVITAMIN) tablet Take 1 tablet by mouth daily.    Historical Provider, MD  Omega-3 Fatty Acids (FISH OIL) 1000 MG CAPS Take 1,000 mg by mouth daily.    Historical Provider, MD  tamsulosin (FLOMAX) 0.4 MG CAPS capsule TAKE 1 CAPSULE (0.4 MG TOTAL) BY MOUTH DAILY  AFTER SUPPER. 03/21/16   Tyler Pita, MD  traZODone (DESYREL) 50 MG tablet Take 50 mg by mouth at bedtime. 06/18/16   Historical Provider, MD    Physical Exam: Vitals:   07/28/16 1536 07/28/16 1537  BP: 133/84   Pulse:  72  SpO2:  95%  Weight:  89.3 kg (196 lb 13.9 oz)    Constitutional: NAD, calm, comfortable Vitals:   07/28/16 1536 07/28/16 1537  BP: 133/84   Pulse:  72  SpO2:  95%  Weight:  89.3 kg (196 lb 13.9 oz)   Eyes: PERRL, lids and conjunctivae normal ENMT: Mucous membranes are moist. Posterior pharynx clear of any exudate or lesions.Normal dentition.  Neck: normal, supple, no masses, no thyromegaly Respiratory: clear to auscultation bilaterally, no wheezing, no crackles. Normal respiratory effort. No accessory muscle use.  Cardiovascular: Regular rate and rhythm, no murmurs / rubs / gallops. No extremity edema. 2+ pedal pulses. No carotid bruits.  Abdomen: no tenderness, no masses palpated. No hepatosplenomegaly. Bowel sounds positive.  Musculoskeletal: no clubbing / cyanosis. No joint deformity upper and lower extremities. Good ROM, no contractures. Normal muscle tone.  Skin: no rashes, lesions, ulcers. No induration Neurologic: CN 2-12 grossly intact. Sensation intact, DTR normal. Strength 5/5 in all 4.  Psychiatric: Normal judgment and insight. Alert and oriented x 3. Normal mood.   Labs on Admission: I have personally reviewed following labs and imaging studies  CBC:  Recent Labs Lab 07/28/16 1520 07/28/16 1527  WBC 6.0  --   NEUTROABS 3.2  --   HGB 12.6* 13.3  HCT 37.6* 39.0  MCV 96.4  --   PLT 285  --    Basic Metabolic Panel:  Recent Labs Lab 07/28/16 1520 07/28/16 1527  NA 136 136  K 4.0 3.8  CL 101 101  CO2 25  --   GLUCOSE 163* 159*  BUN 18 20  CREATININE 0.88 0.90  CALCIUM 9.5  --    GFR: Estimated Creatinine Clearance: 70.5 mL/min (by C-G formula based on SCr of 0.9 mg/dL). Liver Function Tests:  Recent Labs Lab 07/28/16 1520   AST 28  ALT 18  ALKPHOS 58  BILITOT 0.7  PROT 6.5  ALBUMIN 3.8   No results for input(s): LIPASE, AMYLASE in the last 168 hours. No results for input(s): AMMONIA in the last 168 hours. Coagulation Profile:  Recent Labs Lab 07/28/16 1520  INR 1.15   Cardiac Enzymes: No results for input(s): CKTOTAL, CKMB, CKMBINDEX, TROPONINI in the last 168 hours. BNP (last 3 results) No results for input(s): PROBNP in the last 8760 hours. HbA1C: No results for input(s): HGBA1C in the last 72 hours. CBG:  Recent Labs Lab 07/28/16 1535  GLUCAP 157*   Lipid Profile: No results for input(s): CHOL, HDL, LDLCALC, TRIG, CHOLHDL, LDLDIRECT in the last 72 hours. Thyroid Function Tests: No results for input(s): TSH, T4TOTAL, FREET4, T3FREE, THYROIDAB in the last 72 hours. Anemia Panel: No results for input(s): VITAMINB12, FOLATE, FERRITIN, TIBC, IRON, RETICCTPCT in the last 72 hours. Urine analysis: No results found for: COLORURINE, APPEARANCEUR, LABSPEC, PHURINE, GLUCOSEU, HGBUR, BILIRUBINUR, KETONESUR, PROTEINUR, UROBILINOGEN, NITRITE, LEUKOCYTESUR Sepsis Labs: @LABRCNTIP (procalcitonin:4,lacticidven:4) )No results found  for this or any previous visit (from the past 240 hour(s)).   Radiological Exams on Admission: Ct Head Code Stroke W/o Cm Result Date: 07/28/2016 1. No evidence of acute intracranial abnormality. 2. ASPECTS is 10. 3. Moderate chronic small vessel ischemic disease.   EKG: Independently reviewed. Sinus rhythm.  Assessment/Plan   Principal Problem:   Unresponsiveness - Unclear etiology - CT head independently reviewed and did not show acute infarct - MRI brain is pending - The 12 lead EKG showed no acute ischemic changes - Troponin level was WNL - Will need EEG in am - Neuro is following   Active Problems:   Anxiety - Resume xanax    BPH (benign prostatic hyperplasia) - Resume Flomax    Dyslipidemia - Resume omega 3 supplementation      DVT prophylaxis:  SCD's bilaterally  Code Status: full code  Family Communication: no family at the bedside  Disposition Plan: observation, telemetry  Consults called: Neurology  Admission status: observation for unresponsivenss, needs MRI brain and EEG which may take next 24 hours to complete.   Leisa Lenz MD [facility] Pager [phone]  If 7PM-7AM, please contact night-coverage [email or web address] Password [facility]  07/28/2016, 5:03 PM

## 2016-07-28 NOTE — ED Notes (Signed)
Patient given information on admission process.  Patient okay with the plan.  Patient being transport to the floor at this time

## 2016-07-28 NOTE — ED Notes (Signed)
Patient transported to MRI 

## 2016-07-28 NOTE — Consult Note (Addendum)
Neurology Consultation Reason for Consult: code stroke Referring Physician: ed  CC: standing against the wall and unable to answer questions  History is obtained from: EMS  HPI: Kenneth Bowen is a 83 y.o. male who was recently admitted for a stroke workup here.  He was at home earlier and wife saw him standing up against the wall unresponsive and staring and had a bowel movement.  As the wife is not here I cannot obtain any further history.  The patient himself does not recall the event.  I cancelled the code stroke because pt had a NIHSS = 0 and the sx were not c/w stroke.   ROS: A 14 point ROS was performed and is negative except as noted in the HPI  Past Medical History:  Diagnosis Date  . Colon polyps   . Diverticulosis   . Prostate cancer [facility]) 2015   radiation finished Nov 2015    Family History  Problem Relation Age of Onset  . Alcoholism Father   . Cancer Neg Hx   . Colon cancer Neg Hx      Social History:  reports that he has never smoked. He has never used smokeless tobacco. He reports that he drinks about 4.2 oz of alcohol per week . He reports that he does not use drugs.   Exam: Current vital signs: BP 133/84   Pulse 72   Wt 89.3 kg (196 lb 13.9 oz)   SpO2 95%   BMI 27.85 kg/m  Vital signs in last 24 hours: Pulse Rate:  [72] 72 (03/17 1537) BP: (133)/(84) 133/84 (03/17 1536) SpO2:  [95 %] 95 % (03/17 1537) Weight:  [89.3 kg (196 lb 13.9 oz)] 89.3 kg (196 lb 13.9 oz) (03/17 1537)   Physical Exam  Constitutional: Appears well-developed and well-nourished.  Psych: Affect appropriate to situation Eyes: No scleral injection HENT: No OP obstrucion Head: Normocephalic.  Cardiovascular: Normal rate and regular rhythm.  Respiratory: Effort normal and breath sounds normal to anterior ascultation GI: Soft.  No distension. There is no tenderness.  Skin: WDI  Neuro: Mental Status: Patient is awake, alert, oriented to person, place, month, year, and  situation. Patient is able to give a clear and coherent history. No signs of aphasia or neglect Cranial Nerves: II: Visual Fields are full. Pupils are equal, round, and reactive to light.  III,IV, VI: EOMI without ptosis or diploplia.  V: Facial sensation is symmetric to temperature VII: Facial movement is symmetric.  VIII: hearing is intact to voice X: Uvula elevates symmetrically XI: Shoulder shrug is symmetric. XII: tongue is midline without atrophy or fasciculations.  Motor: Tone is normal. Bulk is normal. 5/5 strength was present in all four extremities. Sensory: Sensation is symmetric to light touch and temperature in the arms and legs. Deep Tendon Reflexes: 2+ and symmetric in the biceps and patellae. Plantars: Toes are downgoing bilaterally. Cerebellar: FNF and HKS are intact bilaterally  A/P transient loss of mentation and bowel control.  Pt does not recall events.  Recommend observation admission for MRI and clarification of events.  Continue home meds.  lovenox for dvt prophylaxis and please perform a brief infectious workup as well as bloodwork. MRI.  IF w/u negative might be discharged tomorrow.    Addendum: there is a question on MRI of dural sinus thrombosis (right transverse and sigmoid).  I will ask stroke team to evaluate patient today and help [place] generate a plan regarding AC and workup

## 2016-07-28 NOTE — Progress Notes (Signed)
Pt admitted to the unit from ED: pt A&O x4; MAE x4; neuro wnl; pt oriented to the unit and room; fall/safety precaution and prevention education completed and he voices understanding. VSS; telemetry applied and verified with CCMD; NT called to second verify. Bed alarm on; skin clean, dry and intact with no pressure ulcers or open wounds noted; both ankles have +1 pitting edema; call light within reach and will continue to monitor. Delia Heady RN

## 2016-07-28 NOTE — ED Notes (Signed)
Pt requesting information about admitting before being transported to floor.

## 2016-07-28 NOTE — ED Provider Notes (Signed)
[facility] DEPT Provider Note   CSN: [id number] Arrival date & time: 07/28/16  1516   An emergency department physician performed an initial assessment on this suspected stroke patient at 43.  History   Chief Complaint Chief Complaint  Patient presents with  . Code Stroke    LKW 1415    HPI Kenneth Bowen is a 83 y.o. male.  83 yo M with a chief complaints of altered mental status. Per his wife and EMS he was found in the corner having loss his bowel and bladder, confused and not moving the right side of his body. He arrived as a code stroke but at that point his symptoms had completely resolved. Code stroke was called off. Patient only remembers some mild blurry vision. Denies any other symptoms.   The history is provided by the patient, the spouse and the EMS personnel.  Illness  This is a new problem. The current episode started less than 1 hour ago. The problem occurs rarely. The problem has been resolved. Pertinent negatives include no chest pain, no abdominal pain, no headaches and no shortness of breath. Nothing aggravates the symptoms. Nothing relieves the symptoms. He has tried nothing for the symptoms. The treatment provided no relief.    Past Medical History:  Diagnosis Date  . Colon polyps   . Diverticulosis   . Prostate cancer [facility]) 2015   radiation finished Nov 2015    Patient Active Problem List   Diagnosis Date Noted  . Syncope 07/28/2016  . Anxiety   . Complicated migraine   . History of prostate cancer   . TIA (transient ischemic attack) 07/12/2016  . Abnormal digital rectal exam 01/03/2015  . Prostate cancer ([name]) 12/30/2013  . DIVERTICULOSIS OF COLON 06/06/2010  . RECTAL BLEEDING 06/06/2010  . History of colonic polyps 06/06/2010    Past Surgical History:  Procedure Laterality Date  . INGUINAL HERNIA REPAIR    . KNEE SURGERY    . PROSTATE BIOPSY         Home Medications    Prior to Admission medications   Medication Sig Start  Date End Date Taking? Authorizing Provider  ALPRAZolam Duanne Moron) 1 MG tablet Take 1 mg by mouth at bedtime.    Yes Historical Provider, MD  ARTIFICIAL TEAR OP Place 1 drop into both eyes daily as needed. For dry eyes   Yes Historical Provider, MD  aspirin 81 MG tablet Take 1 tablet (81 mg total) by mouth daily. Over the counter 07/13/16  Yes Ripudeep Krystal Eaton, MD  cetirizine (ZYRTEC) 10 MG tablet Take 10 mg by mouth daily.   Yes Historical Provider, MD  cholecalciferol (VITAMIN D) 1000 UNITS tablet Take 1,000 Units by mouth 2 (two) times daily.    Yes Historical Provider, MD  IRON PO Take 1 tablet by mouth See admin instructions. Take couple times a week per patient. No specific days   Yes Historical Provider, MD  Multiple Vitamin (MULTIVITAMIN) tablet Take 1 tablet by mouth daily.   Yes Historical Provider, MD  Omega-3 Fatty Acids (FISH OIL) 1000 MG CAPS Take 1,000 mg by mouth daily.   Yes Historical Provider, MD  tamsulosin (FLOMAX) 0.4 MG CAPS capsule TAKE 1 CAPSULE (0.4 MG TOTAL) BY MOUTH DAILY AFTER SUPPER. 03/21/16  Yes Tyler Pita, MD  traZODone (DESYREL) 50 MG tablet Take 25 mg by mouth at bedtime.  06/18/16  Yes Historical Provider, MD  TURMERIC PO Take 1 tablet by mouth daily.   Yes Historical Provider, MD  amoxicillin-clavulanate (AUGMENTIN) 875-125 MG tablet Take 1 tablet by mouth 2 (two) times daily. Completed course of medication per patient. Patient is unsure of complete date 07/03/16   Historical Provider, MD    Family History Family History  Problem Relation Age of Onset  . Alcoholism Father   . Cancer Neg Hx   . Colon cancer Neg Hx     Social History Social History  Substance Use Topics  . Smoking status: Never Smoker  . Smokeless tobacco: Never Used  . Alcohol use 4.2 oz/week    7 Glasses of wine per week     Comment: 4 oz nightly     Allergies   Patient has no known allergies.   Review of Systems Review of Systems  Constitutional: Positive for activity change.  Negative for chills and fever.  HENT: Negative for congestion and facial swelling.   Eyes: Negative for discharge and visual disturbance.  Respiratory: Negative for shortness of breath.   Cardiovascular: Negative for chest pain and palpitations.  Gastrointestinal: Negative for abdominal pain, diarrhea and vomiting.  Musculoskeletal: Negative for arthralgias and myalgias.  Skin: Negative for color change and rash.  Neurological: Positive for weakness. Negative for tremors, syncope and headaches.  Psychiatric/Behavioral: Negative for confusion and dysphoric mood.     Physical Exam Updated Vital Signs BP (!) 143/72 (BP Location: Right Arm)   Pulse 60   Temp 97.7 F (36.5 C) (Oral)   Resp 18   Wt 196 lb 13.9 oz (89.3 kg)   SpO2 95%   BMI 27.85 kg/m   Physical Exam  Constitutional: He is oriented to person, place, and time. He appears well-developed and well-nourished.  HENT:  Head: Normocephalic and atraumatic.  Eyes: EOM are normal. Pupils are equal, round, and reactive to light.  Neck: Normal range of motion. Neck supple. No JVD present.  Cardiovascular: Normal rate and regular rhythm.  Exam reveals no gallop and no friction rub.   No murmur heard. Pulmonary/Chest: No respiratory distress. He has no wheezes.  Abdominal: He exhibits no distension. There is no rebound and no guarding.  Musculoskeletal: Normal range of motion.  Neurological: He is alert and oriented to person, place, and time. He has normal strength. No cranial nerve deficit or sensory deficit. Coordination normal. GCS eye subscore is 4. GCS verbal subscore is 5. GCS motor subscore is 6. He displays no Babinski's sign on the right side. He displays no Babinski's sign on the left side.  Reflex Scores:      Tricep reflexes are 2+ on the right side and 2+ on the left side.      Bicep reflexes are 2+ on the right side and 2+ on the left side.      Brachioradialis reflexes are 2+ on the right side and 2+ on the left  side.      Patellar reflexes are 2+ on the right side and 2+ on the left side.      Achilles reflexes are 2+ on the right side and 2+ on the left side. Skin: No rash noted. No pallor.  Psychiatric: He has a normal mood and affect. His behavior is normal.  Nursing note and vitals reviewed.    ED Treatments / Results  Labs (all labs ordered are listed, but only abnormal results are displayed) Labs Reviewed  CBC - Abnormal; Notable for the following:       Result Value   RBC 3.90 (*)    Hemoglobin 12.6 (*)    HCT  37.6 (*)    All other components within normal limits  COMPREHENSIVE METABOLIC PANEL - Abnormal; Notable for the following:    Glucose, Bld 163 (*)    All other components within normal limits  CBG MONITORING, ED - Abnormal; Notable for the following:    Glucose-Capillary 157 (*)    All other components within normal limits  I-STAT CHEM 8, ED - Abnormal; Notable for the following:    Glucose, Bld 159 (*)    All other components within normal limits  URINE CULTURE  PROTIME-INR  APTT  DIFFERENTIAL  URINALYSIS, ROUTINE W REFLEX MICROSCOPIC  TSH  TROPONIN I  TROPONIN I  TROPONIN I  COMPREHENSIVE METABOLIC PANEL  CBC  I-STAT TROPOININ, ED    EKG  EKG Interpretation  Date/Time:  Saturday July 28 2016 15:50:04 EDT Ventricular Rate:  66 PR Interval:  196 QRS Duration: 88 QT Interval:  394 QTC Calculation: 413 R Axis:   68 Text Interpretation:  Normal sinus rhythm Normal ECG sinus arrythmia resolved Otherwise no significant change Confirmed by Tyrone Nine MD, DANIEL [phone]) on 07/28/2016 4:07:55 PM       Radiology Dg Chest 2 View  Result Date: 07/28/2016 CLINICAL DATA:  Blurred vision.  Minor headache. EXAM: CHEST  2 VIEW COMPARISON:  03/13/2016. FINDINGS: Normal sized heart. Tortuous aorta. Mild diffuse peribronchial thickening without significant change. Thoracic spine degenerative changes. Stable 20% T12 superior endplate compression deformity. IMPRESSION: No  acute abnormality.  Stable mild chronic bronchitic changes. Electronically Signed   By: Claudie Revering M.D.   On: 07/28/2016 17:10   Mr Brain Wo Contrast  Result Date: 07/28/2016 CLINICAL DATA:  Transient loss of mentation and bowel control. EXAM: MRI HEAD WITHOUT CONTRAST TECHNIQUE: Multiplanar, multiecho pulse sequences of the brain and surrounding structures were obtained without intravenous contrast. COMPARISON:  Head CT earlier today and MRI 07/12/2016. Head and neck CTA 07/13/2016. FINDINGS: Brain: There is no evidence of acute infarct, intracranial hemorrhage, mass, midline shift, or extra-axial fluid collection. There is moderate enlargement of the lateral and third ventricles and sylvian fissures with only mild cerebral sulcal enlargement, and this may reflect central predominant cerebral atrophy or potentially normal pressure hydrocephalus in the appropriate clinical setting, with atrophy favored. Patchy T2 hyperintensities in the periventricular greater than subcortical cerebral white matter and in the pons are nonspecific but compatible with moderate chronic small vessel ischemic disease. A chronic left cerebellar infarct is again noted. Vascular: Major intracranial arterial flow voids are preserved. Unchanged abnormal T2 and FLAIR hyperintensity throughout the transverse sinus, sigmoid sinus, and jugular bulb on the right. Skull and upper cervical spine: Unremarkable bone marrow signal. Sinuses/Orbits: Bilateral cataract extraction. Clear paranasal sinuses. Small left mastoid effusion. Other: None. IMPRESSION: 1. No acute intracranial abnormality. 2. Moderate chronic small vessel ischemic disease. Chronic left cerebellar infarct. 3. Stable abnormal signal in the right transverse and sigmoid sinuses and right jugular bulb. 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 Electronically Signed   By: Logan Bores M.D.   On: 07/28/2016 18:55   Ct Head Code Stroke W/o  Cm  Result Date: 07/28/2016 CLINICAL DATA:  Code stroke. Confusion, slurred speech, and right-sided weakness. EXAM: CT HEAD WITHOUT CONTRAST TECHNIQUE: Contiguous axial images were obtained from the base of the skull through the vertex without intravenous contrast. COMPARISON:  07/13/2016 FINDINGS: Brain: There is no evidence of acute cortical infarct, intracranial hemorrhage, mass, midline shift, or extra-axial fluid collection. A tiny chronic left cerebellar infarct is  again seen. There is unchanged moderate enlargement of the lateral and third ventricles and sylvian fissures with mild sulcal enlargement, and this may reflect central predominant cerebral atrophy or potentially normal pressure hydrocephalus in the appropriate clinical setting. Cerebral white matter hypodensities are unchanged and nonspecific but compatible with moderate chronic small vessel ischemic disease. Vascular: Calcified atherosclerosis at the skullbase. No hyperdense vessel. Skull: No fracture or focal osseous lesion. Sinuses/Orbits: Paranasal sinuses and mastoid air cells are clear. Bilateral cataract extraction. Other: None. ASPECTS [facility] Stroke Program Early CT Score) - Ganglionic level infarction (caudate, lentiform nuclei, internal capsule, insula, M1-M3 cortex): 7 - Supraganglionic infarction (M4-M6 cortex): 3 Total score (0-10 with 10 being normal): 10 IMPRESSION: 1. No evidence of acute intracranial abnormality. 2. ASPECTS is 10. 3. Moderate chronic small vessel ischemic disease. Results sent via text page to Dr. Wendee Beavers on 07/28/2016 at 3:39 p.m. Electronically Signed   By: Logan Bores M.D.   On: 07/28/2016 15:39    Procedures Procedures (including critical care time)  Medications Ordered in ED Medications  aspirin EC tablet 81 mg (not administered)  omega-3 acid ethyl esters (LOVAZA) capsule 1 g (not administered)  traZODone (DESYREL) tablet 50 mg (not administered)  tamsulosin (FLOMAX) capsule 0.4 mg (not  administered)  ALPRAZolam (XANAX) tablet 1 mg (not administered)  loratadine (CLARITIN) tablet 10 mg (not administered)  cholecalciferol (VITAMIN D) tablet 1,000 Units (not administered)  multivitamin with minerals tablet 1 tablet (not administered)  sodium chloride flush (NS) 0.9 % injection 3 mL (not administered)  acetaminophen (TYLENOL) tablet 650 mg (not administered)    Or  acetaminophen (TYLENOL) suppository 650 mg (not administered)  ondansetron (ZOFRAN) tablet 4 mg (not administered)    Or  ondansetron (ZOFRAN) injection 4 mg (not administered)     Initial Impression / Assessment and Plan / ED Course  I have reviewed the triage vital signs and the nursing notes.  Pertinent labs & imaging results that were available during my care of the patient were reviewed by me and considered in my medical decision making (see chart for details).     83 yo M With a chief complaint of altered mental status. This has spontaneously resolved. Patient initially arrived as a code stroke since he had focal weakness. This had resolved and so that was called off. CT of the head with chronic findings. I discussed the patient with Dr. Wendee Beavers, neurology. He recommended repeat MRI and admission.  The patients results and plan were reviewed and discussed.   Any x-rays performed were independently reviewed by myself.   Differential diagnosis were considered with the presenting HPI.  Medications  aspirin EC tablet 81 mg (not administered)  omega-3 acid ethyl esters (LOVAZA) capsule 1 g (not administered)  traZODone (DESYREL) tablet 50 mg (not administered)  tamsulosin (FLOMAX) capsule 0.4 mg (not administered)  ALPRAZolam (XANAX) tablet 1 mg (not administered)  loratadine (CLARITIN) tablet 10 mg (not administered)  cholecalciferol (VITAMIN D) tablet 1,000 Units (not administered)  multivitamin with minerals tablet 1 tablet (not administered)  sodium chloride flush (NS) 0.9 % injection 3 mL (not  administered)  acetaminophen (TYLENOL) tablet 650 mg (not administered)    Or  acetaminophen (TYLENOL) suppository 650 mg (not administered)  ondansetron (ZOFRAN) tablet 4 mg (not administered)    Or  ondansetron (ZOFRAN) injection 4 mg (not administered)    Vitals:   07/28/16 1615 07/28/16 1915 07/28/16 1930 07/28/16 2021  BP: 127/62 (!) 138/113 132/72 (!) 143/72  Pulse: 61 (!) 50 (!)  55 60  Resp: 15 19 20 18   Temp:    97.7 F (36.5 C)  TempSrc:    Oral  SpO2: 96% 94% 100% 95%  Weight:        Final diagnoses:  Spell of abnormal behavior    Admission/ observation were discussed with the admitting physician, patient and/or family and they are comfortable with the plan.    Final Clinical Impressions(s) / ED Diagnoses   Final diagnoses:  Spell of abnormal behavior    New Prescriptions Current Discharge Medication List       Deno Etienne, DO 07/28/16 2118

## 2016-07-28 NOTE — ED Triage Notes (Signed)
Patient arrived by EMS for activated code stroke.  LSN [id number] with slurred speech, right sided weakness, and confusion. V[place] MD saw patient on arrival, cleared for CT and canceled code stroke at 1522.   Patient resolved symptoms before arrival to ED.  Patient is A&Ox4 at this time.

## 2016-07-28 NOTE — ED Notes (Addendum)
Code Stroke Canceled per Dr. Wendee Beavers at [phone]

## 2016-07-29 DIAGNOSIS — Z8546 Personal history of malignant neoplasm of prostate: Secondary | ICD-10-CM | POA: Diagnosis not present

## 2016-07-29 DIAGNOSIS — E663 Overweight: Secondary | ICD-10-CM | POA: Diagnosis present

## 2016-07-29 DIAGNOSIS — Z8673 Personal history of transient ischemic attack (TIA), and cerebral infarction without residual deficits: Secondary | ICD-10-CM | POA: Diagnosis not present

## 2016-07-29 DIAGNOSIS — Z923 Personal history of irradiation: Secondary | ICD-10-CM | POA: Diagnosis not present

## 2016-07-29 DIAGNOSIS — R55 Syncope and collapse: Secondary | ICD-10-CM | POA: Diagnosis not present

## 2016-07-29 DIAGNOSIS — K5790 Diverticulosis of intestine, part unspecified, without perforation or abscess without bleeding: Secondary | ICD-10-CM | POA: Diagnosis present

## 2016-07-29 DIAGNOSIS — R4182 Altered mental status, unspecified: Secondary | ICD-10-CM | POA: Diagnosis present

## 2016-07-29 DIAGNOSIS — Z713 Dietary counseling and surveillance: Secondary | ICD-10-CM | POA: Diagnosis not present

## 2016-07-29 DIAGNOSIS — R159 Full incontinence of feces: Secondary | ICD-10-CM | POA: Diagnosis not present

## 2016-07-29 DIAGNOSIS — R001 Bradycardia, unspecified: Secondary | ICD-10-CM | POA: Diagnosis present

## 2016-07-29 DIAGNOSIS — I6521 Occlusion and stenosis of right carotid artery: Secondary | ICD-10-CM | POA: Diagnosis present

## 2016-07-29 DIAGNOSIS — N400 Enlarged prostate without lower urinary tract symptoms: Secondary | ICD-10-CM | POA: Diagnosis present

## 2016-07-29 DIAGNOSIS — I459 Conduction disorder, unspecified: Secondary | ICD-10-CM | POA: Diagnosis present

## 2016-07-29 DIAGNOSIS — R739 Hyperglycemia, unspecified: Secondary | ICD-10-CM | POA: Diagnosis present

## 2016-07-29 DIAGNOSIS — R29700 NIHSS score 0: Secondary | ICD-10-CM | POA: Diagnosis present

## 2016-07-29 DIAGNOSIS — I739 Peripheral vascular disease, unspecified: Secondary | ICD-10-CM | POA: Diagnosis present

## 2016-07-29 DIAGNOSIS — Z6825 Body mass index (BMI) 25.0-25.9, adult: Secondary | ICD-10-CM | POA: Diagnosis not present

## 2016-07-29 DIAGNOSIS — H538 Other visual disturbances: Secondary | ICD-10-CM | POA: Diagnosis present

## 2016-07-29 DIAGNOSIS — Z7982 Long term (current) use of aspirin: Secondary | ICD-10-CM | POA: Diagnosis not present

## 2016-07-29 DIAGNOSIS — Z86010 Personal history of colonic polyps: Secondary | ICD-10-CM | POA: Diagnosis not present

## 2016-07-29 DIAGNOSIS — F419 Anxiety disorder, unspecified: Secondary | ICD-10-CM | POA: Diagnosis present

## 2016-07-29 DIAGNOSIS — Z79899 Other long term (current) drug therapy: Secondary | ICD-10-CM | POA: Diagnosis not present

## 2016-07-29 DIAGNOSIS — R4189 Other symptoms and signs involving cognitive functions and awareness: Secondary | ICD-10-CM | POA: Diagnosis not present

## 2016-07-29 DIAGNOSIS — I10 Essential (primary) hypertension: Secondary | ICD-10-CM | POA: Diagnosis present

## 2016-07-29 DIAGNOSIS — E785 Hyperlipidemia, unspecified: Secondary | ICD-10-CM | POA: Diagnosis present

## 2016-07-29 DIAGNOSIS — I82891 Chronic embolism and thrombosis of other specified veins: Secondary | ICD-10-CM | POA: Diagnosis present

## 2016-07-29 LAB — COMPREHENSIVE METABOLIC PANEL
ALK PHOS: 53 U/L (ref 38–126)
ALT: 18 U/L (ref 17–63)
ANION GAP: 5 (ref 5–15)
AST: 24 U/L (ref 15–41)
Albumin: 3.4 g/dL — ABNORMAL LOW (ref 3.5–5.0)
BUN: 11 mg/dL (ref 6–20)
CALCIUM: 9 mg/dL (ref 8.9–10.3)
CO2: 27 mmol/L (ref 22–32)
Chloride: 105 mmol/L (ref 101–111)
Creatinine, Ser: 0.92 mg/dL (ref 0.61–1.24)
Glucose, Bld: 116 mg/dL — ABNORMAL HIGH (ref 65–99)
Potassium: 3.7 mmol/L (ref 3.5–5.1)
Sodium: 137 mmol/L (ref 135–145)
Total Bilirubin: 0.6 mg/dL (ref 0.3–1.2)
Total Protein: 6 g/dL — ABNORMAL LOW (ref 6.5–8.1)

## 2016-07-29 LAB — CBC
HCT: 36.4 % — ABNORMAL LOW (ref 39.0–52.0)
HEMOGLOBIN: 12.1 g/dL — AB (ref 13.0–17.0)
MCH: 32.2 pg (ref 26.0–34.0)
MCHC: 33.2 g/dL (ref 30.0–36.0)
MCV: 96.8 fL (ref 78.0–100.0)
Platelets: 260 10*3/uL (ref 150–400)
RBC: 3.76 MIL/uL — AB (ref 4.22–5.81)
RDW: 12.7 % (ref 11.5–15.5)
WBC: 4.7 10*3/uL (ref 4.0–10.5)

## 2016-07-29 LAB — GLUCOSE, CAPILLARY: GLUCOSE-CAPILLARY: 104 mg/dL — AB (ref 65–99)

## 2016-07-29 LAB — TROPONIN I: Troponin I: <0.03 ng/mL (ref <0.03)

## 2016-07-29 MED ORDER — HEPARIN SODIUM (PORCINE) 5000 UNIT/ML IJ SOLN
5000.0000 [IU] | Freq: Three times a day (TID) | INTRAMUSCULAR | Status: DC
  Administered 2016-07-29 – 2016-07-30 (×4): 5000 [IU] via SUBCUTANEOUS
  Filled 2016-07-29 (×4): qty 1

## 2016-07-29 NOTE — Progress Notes (Signed)
STROKE TEAM PROGRESS NOTE   HISTORY OF PRESENT ILLNESS (per record) Kenneth Bowen is an 83 y.o. male who was recently admitted for a stroke workup here.  He was at home earlier and wife saw him standing up against the wall unresponsive, staring and had a bowel movement. As the wife is not here I cannot obtain any further history.  The patient himself does not recall the event.  I cancelled the code stroke because pt had a NIHSS = 0 and the sx were not c/w stroke.  SUBJECTIVE (INTERVAL HISTORY) His family was not at the bedside.  Patient reports feeling better now.  He was able to share what his wife described to him as the "tow episodes."  Two weeks ago, patient was leaning over to pick up something and says he may have strained to reach down to get the item.  He says his head was "all the way down."  When he sat up, he apparently became dizzy and had blurred vision.  He then became confused and his wife told him that for 20 minutes he "reached for things that were not there and acted confused."  This lasted for 20 minutes and he returned to normal by the time EMS arrived.  The second event occurred prior to admission.  Patient was sitting upright and did not have a positional change or strain to reach for something.  He had sudden onset of dizziness.  Like the time two weeks before, his wife assisted him to another room but he was confused again.  This time the patient did not "return to clear understanding" until he was in the ambulance.  This was approximately 30-40 minutes after onset.  When asked if this has happened before, patient recalled an event 10 years prior when he suddenly lost his words for a few minutes and an event 15 years prior when he had trouble understanding the words he was reading.  However, no confusion spells of note   OBJECTIVE Temp:  [97.6 F (36.4 C)-98.1 F (36.7 C)] 97.6 F (36.4 C) (03/18 1333) Pulse Rate:  [50-70] 59 (03/18 0807) Cardiac Rhythm: Sinus  bradycardia;Heart block (03/18 0800) Resp:  [15-20] 16 (03/18 1333) BP: (97-143)/(41-113) 114/62 (03/18 0807) SpO2:  [94 %-100 %] 96 % (03/18 0952) Weight:  [81.7 kg (180 lb 1.6 oz)-82.5 kg (181 lb 14.4 oz)] 82.5 kg (181 lb 14.4 oz) (03/18 0500)  CBC:   Recent Labs Lab 07/28/16 1520 07/28/16 1527 07/29/16 0822  WBC 6.0  --  4.7  NEUTROABS 3.2  --   --   HGB 12.6* 13.3 12.1*  HCT 37.6* 39.0 36.4*  MCV 96.4  --  96.8  PLT 285  --  245    Basic Metabolic Panel:   Recent Labs Lab 07/28/16 1520 07/28/16 1527 07/29/16 0822  NA 136 136 137  K 4.0 3.8 3.7  CL 101 101 105  CO2 25  --  27  GLUCOSE 163* 159* 116*  BUN 18 20 11   CREATININE 0.88 0.90 0.92  CALCIUM 9.5  --  9.0    Lipid Panel:     Component Value Date/Time   CHOL 114 07/13/2016 0231   TRIG 85 07/13/2016 0231   HDL 42 07/13/2016 0231   CHOLHDL 2.7 07/13/2016 0231   VLDL 17 07/13/2016 0231   LDLCALC 55 07/13/2016 0231   HgbA1c:  Lab Results  Component Value Date   HGBA1C 5.8 (H) 07/13/2016   Urine Drug Screen: No results found for:  LABOPIA, COCAINSCRNUR, LABBENZ, AMPHETMU, THCU, LABBARB    IMAGING  Dg Chest 2 View 07/28/2016 No acute abnormality.  Stable mild chronic bronchitic changes.  Mr Brain Wo Contrast 07/28/2016 1. No acute intracranial abnormality.  2. Moderate chronic small vessel ischemic disease. Chronic left cerebellar infarct.  3. Stable abnormal signal in the right transverse and sigmoid sinuses and right jugular bulb. 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    Ct Head Code Stroke W/o Cm 07/28/2016 1. No evidence of acute intracranial abnormality.  2. ASPECTS is 10.  3. Moderate chronic small vessel ischemic disease.   CTA Head and Neck 07/13/2016 CT HEAD: No acute intracranial process. 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  CTA NECK: Atherosclerosis resulting in 50%  stenosis RIGHT internal carotid artery origin.  CTA HEAD:  No emergent large vessel occlusion or severe stenosis.  PHYSICAL EXAM General - Well nourished, well developed, in NAD   Cardiovascular - Regular rate and rhythm Pulmonary: CTA Abdomen: NT, ND, normal bowel sounds Extremities: No C/C/E  Neurological Exam Mental Status: Normal Orientation:  Oriented to person, place and time Speech:  Fluent; no dysarthria   Cranial Nerves:  PERRL; EOMI; visual fields full, face grossly symmetric, hearing grossly intact; shrug symmetric and tongue midline  Motor Exam:  Tone:  Within normal limits; Strength: 5/5 throughout  Sensory: Intact to light touch throughout  Coordination:  Intact finger to nose  Gait: Deferred  ASSESSMENT/PLAN [name] is a 83 y.o. male with history of previous stroke, recent strokeworkup and history of prostate cancer, brought to the emergency department after he was found standing up against a wall, unresponsive, staring, and having had a bowel movement.Marland Kitchen He did not receive IV t-PA due to an NIHSS of 0 and no symptoms consistent with a stroke.  Possible TIA vs Seizure: Patient's post event confusion is concerning for complex partial seizure  Resultant  Symptom free now  MRI - No acute intracranial abnormality. Depending on EEG may choose to perform MRI of head with contrast  MRA - not performed  EEG - pending; asked the RNs to sleep deprive  Carotid Doppler - CTA neck 07/13/2016 - 50% stenosis RIGHT internal carotid artery origin.  2D Echo - 07/13/2016 - EF 55-60%. No cardiac source of emboli identified.  LDL - 55  HgbA1c - 5.8  VTE prophylaxis - SCDs Diet regular Room service appropriate? Yes; Fluid consistency: Thin  aspirin 81 mg daily prior to admission, now on aspirin 81 mg daily  Patient counseled to be compliant with his antithrombotic medications  Ongoing aggressive stroke risk factor management  Therapy recommendations:  pending  Disposition:  Pending  Hypertension  Blood pressure tends to run low  Permissive hypertension (OK if < 220/120) but gradually normalize in 5-7 days  Long-term BP goal normotensive  Hyperlipidemia  Home meds: No lipid lowering medications prior to admission   LDL 55, goal < 70   Other Stroke Risk Factors  Advanced age  ETOH use, advised to drink no more than 1 drink  a day  Overweight, Body mass index is 25.73 kg/m., recommend weight loss, diet and exercise as appropriate   Hx stroke/TIA   Other Active Problems  Hyperglycemia  Hospital day # 0  To contact Stroke Continuity provider, please refer to [email or web address] After hours, contact General Neurology

## 2016-07-29 NOTE — Progress Notes (Signed)
PROGRESS NOTE                                                                                                                                                                                                             Patient Demographics:    Kenneth Bowen, is a 83 y.o. male, DOB - 08/26/1930, PYK:[id number]  Admit date - 07/28/2016   Admitting Physician Robbie Lis, MD  Outpatient Primary MD for the patient is GREEN, [name], MD  LOS - 0  Chief Complaint  Patient presents with  . Code Stroke    LKW 1415       Brief Narrative   Kenneth Bowen is a 83 y.o. male with medical history significant for BPH, dyslipidemia, recent hospitalization for acute neurologic deficit (lightheadedness) and has had TIA work and discharged home 07/13/2016. He presented to [facility] after he was found standing up against the wall and not responding to his wife when she called his name   Subjective:    Leonidas Romberg today has, No headache, No chest pain, No abdominal pain - No Nausea, No new weakness tingling or numbness, No Cough - SOB.     Assessment  & Plan :     1.Recent mental status. Unclear etiology. 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 Neurology to evaluate. Will defer EEG on any further workup or change in plan to neurology. Currently back to his baseline.  2. BPH. Continue Flomax.  3. Dyslipidemia. On on a 3 supplementation continue.  4. Anxiety. Continue as needed Xanax.    Diet : Diet regular Room service appropriate? Yes; Fluid consistency: Thin    Family Communication  :  None  Code Status :  Full  Disposition Plan  :  TBD  Consults  :  Neuro  Procedures  :    MRI brain - non acute  DVT Prophylaxis  :    Heparin    Lab Results  Component Value Date   PLT  260 07/29/2016    Inpatient Medications  Scheduled Meds: . ALPRAZolam  1 mg Oral QHS  . aspirin EC  81 mg Oral Daily  . cholecalciferol  1,000 Units Oral Daily  . loratadine  10  mg Oral Daily  . multivitamin with minerals  1 tablet Oral Daily  . omega-3 acid ethyl esters  1 g Oral Daily  . sodium chloride flush  3 mL Intravenous Q12H  . tamsulosin  0.4 mg Oral QPC supper  . traZODone  50 mg Oral QHS   Continuous Infusions: PRN Meds:.acetaminophen **OR** acetaminophen, ondansetron **OR** ondansetron (ZOFRAN) IV  Antibiotics  :    Anti-infectives    None         Objective:   Vitals:   07/29/16 0500 07/29/16 0630 07/29/16 0807 07/29/16 0952  BP:  102/62 114/62   Pulse:  (!) 53 (!) 59   Resp:  18 16 16   Temp:    98.1 F (36.7 C)  TempSrc:    Oral  SpO2:  97% 96% 96%  Weight: 82.5 kg (181 lb 14.4 oz)     Height:        Wt Readings from Last 3 Encounters:  07/29/16 82.5 kg (181 lb 14.4 oz)  07/12/16 82.1 kg (181 lb)  06/14/15 83.5 kg (184 lb)     Intake/Output Summary (Last 24 hours) at 07/29/16 1337 Last data filed at 07/29/16 1221  Gross per 24 hour  Intake              600 ml  Output              650 ml  Net              -50 ml     Physical Exam  Awake Alert, Oriented X 3, No new F.N deficits, Normal affect [place].AT,PERRAL Supple Neck,No JVD, No cervical lymphadenopathy appriciated.  Symmetrical Chest wall movement, Good air movement bilaterally, CTAB RRR,No Gallops,Rubs or new Murmurs, No Parasternal Heave +ve B.Sounds, Abd Soft, No tenderness, No organomegaly appriciated, No rebound - guarding or rigidity. No Cyanosis, Clubbing or edema, No new Rash or bruise       Data Review:    CBC  Recent Labs Lab 07/28/16 1520 07/28/16 1527 07/29/16 0822  WBC 6.0  --  4.7  HGB 12.6* 13.3 12.1*  HCT 37.6* 39.0 36.4*  PLT 285  --  260  MCV 96.4  --  96.8  MCH 32.3  --  32.2  MCHC 33.5  --  33.2  RDW 12.7  --  12.7  LYMPHSABS 2.2  --   --   MONOABS  0.4  --   --   EOSABS 0.2  --   --   BASOSABS 0.0  --   --     Chemistries   Recent Labs Lab 07/28/16 1520 07/28/16 1527 07/29/16 0822  NA 136 136 137  K 4.0 3.8 3.7  CL 101 101 105  CO2 25  --  27  GLUCOSE 163* 159* 116*  BUN 18 20 11   CREATININE 0.88 0.90 0.92  CALCIUM 9.5  --  9.0  AST 28  --  24  ALT 18  --  18  ALKPHOS 58  --  53  BILITOT 0.7  --  0.6   ------------------------------------------------------------------------------------------------------------------ No results for input(s): CHOL, HDL, LDLCALC, TRIG, CHOLHDL, LDLDIRECT in the last 72 hours.  Lab Results  Component Value Date   HGBA1C 5.8 (H) 07/13/2016   ------------------------------------------------------------------------------------------------------------------  Recent Labs  07/28/16 2029  TSH 2.194   ------------------------------------------------------------------------------------------------------------------ No results for input(s): VITAMINB12, FOLATE, FERRITIN, TIBC, IRON, RETICCTPCT in the last 72 hours.  Coagulation profile  Recent Labs Lab 07/28/16 1520  INR 1.15  No results for input(s): DDIMER in the last 72 hours.  Cardiac Enzymes  Recent Labs Lab 07/28/16 2029 07/29/16 0211 07/29/16 0822  TROPONINI <0.03 <0.03 <0.03   ------------------------------------------------------------------------------------------------------------------ No results found for: BNP  Micro Results No results found for this or any previous visit (from the past 240 hour(s)).  Radiology Reports Ct Angio Head W Or Wo Contrast  Result Date: 07/13/2016 CLINICAL DATA:  Follow-up TIA.  History of prostate cancer. EXAM: CT ANGIOGRAPHY HEAD AND NECK TECHNIQUE: Multidetector CT imaging of the head and neck was performed using the standard protocol during bolus administration of intravenous contrast. Multiplanar CT image reconstructions and MIPs were obtained to evaluate the vascular anatomy.  Carotid stenosis measurements (when applicable) are obtained utilizing NASCET criteria, using the distal internal carotid diameter as the denominator. CONTRAST:  50 cc Isovue 370 COMPARISON:  MRI of the head July 12, 2016 FINDINGS: CT HEAD FINDINGS BRAIN: Stable moderate to severe ventriculomegaly with disproportionate mild sulcal effacement at the convexities. No intraparenchymal hemorrhage, mass effect nor midline shift. 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 Patchy supratentorial white matter hypodensities within normal range for patient's age, though non-specific are most compatible with chronic small vessel ischemic disease. No acute large vascular territory infarcts. No abnormal extra-axial fluid collections. Basal cisterns are patent. VASCULAR: Moderate calcific atherosclerosis of the carotid siphons. SKULL: No skull fracture. Osteopenia. No significant scalp soft tissue swelling. SINUSES/ORBITS: Moderate LEFT maxillary sinus mucosal thickening. Trace chronic LEFT mastoid effusion. Status post bilateral ocular lens implants. The included ocular globes and orbital contents are non-suspicious. OTHER: None. CTA NECK AORTIC ARCH: Normal appearance of the thoracic arch, normal branch pattern. Mild calcific atherosclerosis of the aortic arch. The origins of the innominate, left Common carotid artery and subclavian artery are widely patent. RIGHT CAROTID SYSTEM: Common carotid artery is widely patent, coursing in a straight line fashion. Eccentric intimal thickening and calcific atherosclerosis resulting in 50% stenosis RIGHT internal carotid artery origin by NASCET criteria. Normal appearance of the included internal carotid artery. LEFT CAROTID SYSTEM: Common carotid artery is widely patent, coursing in a straight line fashion. Normal appearance of the carotid bifurcation without hemodynamically significant stenosis by NASCET criteria, mild eccentric calcific atherosclerosis. Normal appearance of the included  internal carotid artery. VERTEBRAL ARTERIES:Left vertebral artery is dominant. Normal appearance of the vertebral arteries, which appear widely patent. Mild extrinsic deformity due to degenerative cervical spine. SKELETON: No acute osseous process though bone windows have not been submitted. Severe LEFT C4-5 and moderate to severe C5-6 and C6-7 neural foraminal narrowing. OTHER NECK: Soft tissues of the neck are non-acute though, not tailored for evaluation. Mild heterogeneous lung attenuation within the apices assisted with pulmonary edema and small airway disease. CTA HEAD ANTERIOR CIRCULATION: Normal appearance of the cervical internal carotid arteries, petrous, cavernous and supra clinoid internal carotid arteries. Widely patent anterior communicating artery. Patent anterior and middle cerebral arteries. No large vessel occlusion, hemodynamically significant stenosis, dissection, luminal irregularity, contrast extravasation or aneurysm. POSTERIOR CIRCULATION: Normal appearance of the vertebral arteries, vertebrobasilar junction and basilar artery, as well as main branch vessels. Patent posterior cerebral arteries. No large vessel occlusion, hemodynamically significant stenosis, dissection, luminal irregularity, contrast extravasation or aneurysm. VENOUS SINUSES: Major dural venous sinuses are patent though not tailored for evaluation on this angiographic examination. ANATOMIC VARIANTS: None. DELAYED PHASE: No abnormal intracranial enhancement. MIP images reviewed. IMPRESSION: CT HEAD: No acute intracranial process. 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 CTA NECK: Atherosclerosis resulting in 50%  stenosis RIGHT internal carotid artery origin. CTA HEAD:  No emergent large vessel occlusion or severe stenosis. Electronically Signed   By: Elon Alas M.D.   On: 07/13/2016 02:03   Dg Chest 2 View  Result Date: 07/28/2016 CLINICAL DATA:   Blurred vision.  Minor headache. EXAM: CHEST  2 VIEW COMPARISON:  03/13/2016. FINDINGS: Normal sized heart. Tortuous aorta. Mild diffuse peribronchial thickening without significant change. Thoracic spine degenerative changes. Stable 20% T12 superior endplate compression deformity. IMPRESSION: No acute abnormality.  Stable mild chronic bronchitic changes. Electronically Signed   By: Claudie Revering M.D.   On: 07/28/2016 17:10   Ct Head Wo Contrast  Result Date: 07/12/2016 CLINICAL DATA:  Stroke symptoms EXAM: CT HEAD WITHOUT CONTRAST TECHNIQUE: Contiguous axial images were obtained from the base of the skull through the vertex without intravenous contrast. COMPARISON:  None. FINDINGS: Brain: No intracranial hemorrhage, mass effect or midline shift. Mild cerebral atrophy. There is periventricular and patchy subcortical white matter decreased attenuation probable due to chronic small vessel ischemic changes. No definite acute cortical infarction. No mass lesion is noted on this unenhanced scan. There is focal area of decreased attenuation just lateral to left caudate nucleus measures about 1 cm medial aspect of internal capsule. Evolving ischemia cannot be excluded. Further correlation with MRI is recommended. Vascular: Atherosclerotic calcifications of carotid siphon. Skull: No skull fracture Sinuses/Orbits: There is mucosal thickening with air-fluid level in right maxillary sinus. Acute sinusitis cannot be excluded. Other: None IMPRESSION: Mild cerebral atrophy. No intracranial hemorrhage. No mass effect. There is periventricular and patchy subcortical white matter decreased attenuation probable due to chronic small vessel ischemic changes. No definite acute cortical infarction. No mass lesion is noted on this unenhanced scan. There is focal area of decreased attenuation just lateral to left caudate nucleus measures about 1 cm medial aspect of internal capsule. Evolving ischemia cannot be excluded. Further  correlation with MRI is recommended. There is mucosal thickening with air-fluid level in left maxillary sinus. Acute sinusitis cannot be excluded. These results were called by telephone at the time of interpretation on 07/12/2016 at 2:36 pm to Dr. Erlinda Hong, who verbally acknowledged these results. Electronically Signed   By: Lahoma Crocker M.D.   On: 07/12/2016 14:37   Ct Angio Neck W Or Wo Contrast  Result Date: 07/13/2016 CLINICAL DATA:  Follow-up TIA.  History of prostate cancer. EXAM: CT ANGIOGRAPHY HEAD AND NECK TECHNIQUE: Multidetector CT imaging of the head and neck was performed using the standard protocol during bolus administration of intravenous contrast. Multiplanar CT image reconstructions and MIPs were obtained to evaluate the vascular anatomy. Carotid stenosis measurements (when applicable) are obtained utilizing NASCET criteria, using the distal internal carotid diameter as the denominator. CONTRAST:  50 cc Isovue 370 COMPARISON:  MRI of the head July 12, 2016 FINDINGS: CT HEAD FINDINGS BRAIN: Stable moderate to severe ventriculomegaly with disproportionate mild sulcal effacement at the convexities. No intraparenchymal hemorrhage, mass effect nor midline shift. 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 Patchy supratentorial white matter hypodensities within normal range for patient's age, though non-specific are most compatible with chronic small vessel ischemic disease. No acute large vascular territory infarcts. No abnormal extra-axial fluid collections. Basal cisterns are patent. VASCULAR: Moderate calcific atherosclerosis of the carotid siphons. SKULL: No skull fracture. Osteopenia. No significant scalp soft tissue swelling. SINUSES/ORBITS: Moderate LEFT maxillary sinus mucosal thickening. Trace chronic LEFT mastoid effusion. Status post bilateral ocular lens implants. The included ocular globes and orbital contents are non-suspicious. OTHER: None. CTA NECK AORTIC  ARCH: Normal appearance of the thoracic arch,  normal branch pattern. Mild calcific atherosclerosis of the aortic arch. The origins of the innominate, left Common carotid artery and subclavian artery are widely patent. RIGHT CAROTID SYSTEM: Common carotid artery is widely patent, coursing in a straight line fashion. Eccentric intimal thickening and calcific atherosclerosis resulting in 50% stenosis RIGHT internal carotid artery origin by NASCET criteria. Normal appearance of the included internal carotid artery. LEFT CAROTID SYSTEM: Common carotid artery is widely patent, coursing in a straight line fashion. Normal appearance of the carotid bifurcation without hemodynamically significant stenosis by NASCET criteria, mild eccentric calcific atherosclerosis. Normal appearance of the included internal carotid artery. VERTEBRAL ARTERIES:Left vertebral artery is dominant. Normal appearance of the vertebral arteries, which appear widely patent. Mild extrinsic deformity due to degenerative cervical spine. SKELETON: No acute osseous process though bone windows have not been submitted. Severe LEFT C4-5 and moderate to severe C5-6 and C6-7 neural foraminal narrowing. OTHER NECK: Soft tissues of the neck are non-acute though, not tailored for evaluation. Mild heterogeneous lung attenuation within the apices assisted with pulmonary edema and small airway disease. CTA HEAD ANTERIOR CIRCULATION: Normal appearance of the cervical internal carotid arteries, petrous, cavernous and supra clinoid internal carotid arteries. Widely patent anterior communicating artery. Patent anterior and middle cerebral arteries. No large vessel occlusion, hemodynamically significant stenosis, dissection, luminal irregularity, contrast extravasation or aneurysm. POSTERIOR CIRCULATION: Normal appearance of the vertebral arteries, vertebrobasilar junction and basilar artery, as well as main branch vessels. Patent posterior cerebral arteries. No large vessel occlusion, hemodynamically significant  stenosis, dissection, luminal irregularity, contrast extravasation or aneurysm. VENOUS SINUSES: Major dural venous sinuses are patent though not tailored for evaluation on this angiographic examination. ANATOMIC VARIANTS: None. DELAYED PHASE: No abnormal intracranial enhancement. MIP images reviewed. IMPRESSION: CT HEAD: No acute intracranial process. 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 CTA NECK: Atherosclerosis resulting in 50% stenosis RIGHT internal carotid artery origin. CTA HEAD:  No emergent large vessel occlusion or severe stenosis. Electronically Signed   By: Elon Alas M.D.   On: 07/13/2016 02:03   Mr Brain Wo Contrast  Result Date: 07/28/2016 CLINICAL DATA:  Transient loss of mentation and bowel control. EXAM: MRI HEAD WITHOUT CONTRAST TECHNIQUE: Multiplanar, multiecho pulse sequences of the brain and surrounding structures were obtained without intravenous contrast. COMPARISON:  Head CT earlier today and MRI 07/12/2016. Head and neck CTA 07/13/2016. FINDINGS: Brain: There is no evidence of acute infarct, intracranial hemorrhage, mass, midline shift, or extra-axial fluid collection. There is moderate enlargement of the lateral and third ventricles and sylvian fissures with only mild cerebral sulcal enlargement, and this may reflect central predominant cerebral atrophy or potentially normal pressure hydrocephalus in the appropriate clinical setting, with atrophy favored. Patchy T2 hyperintensities in the periventricular greater than subcortical cerebral white matter and in the pons are nonspecific but compatible with moderate chronic small vessel ischemic disease. A chronic left cerebellar infarct is again noted. Vascular: Major intracranial arterial flow voids are preserved. Unchanged abnormal T2 and FLAIR hyperintensity throughout the transverse sinus, sigmoid sinus, and jugular bulb on the right. Skull and upper cervical  spine: Unremarkable bone marrow signal. Sinuses/Orbits: Bilateral cataract extraction. Clear paranasal sinuses. Small left mastoid effusion. Other: None. IMPRESSION: 1. No acute intracranial abnormality. 2. Moderate chronic small vessel ischemic disease. Chronic left cerebellar infarct. 3. Stable abnormal signal in the right transverse and sigmoid sinuses and right jugular bulb. Given the appearance on the prior CTA, this may reflect  chronic partial thrombosis. Electronically Signed   By: Logan Bores M.D.   On: 07/28/2016 18:55   Mr Brain Wo Contrast  Result Date: 07/12/2016 CLINICAL DATA:  TIA. EXAM: MRI HEAD WITHOUT CONTRAST TECHNIQUE: Multiplanar, multiecho pulse sequences of the brain and surrounding structures were obtained without intravenous contrast. COMPARISON:  CT head 07/12/2016 FINDINGS: Brain: Negative for acute infarct. Moderate chronic microvascular ischemic changes in the white matter and pons. Small infarcts in the left cerebellum are chronic. Generalized atrophy. Negative for hemorrhage or fluid collection. Negative for mass or edema. Vascular: Normal arterial flow void. Skull and upper cervical spine: Negative Sinuses/Orbits: Air-fluid level left maxillary sinus with diffuse mucosal thickening. Bilateral lens replacement. Other: None IMPRESSION: Negative for acute infarct Atrophy and chronic microvascular ischemic changes in the white matter and pons. Electronically Signed   By: Franchot Gallo M.D.   On: 07/12/2016 16:09   Ct Head Code Stroke W/o Cm  Result Date: 07/28/2016 CLINICAL DATA:  Code stroke. Confusion, slurred speech, and right-sided weakness. EXAM: CT HEAD WITHOUT CONTRAST TECHNIQUE: Contiguous axial images were obtained from the base of the skull through the vertex without intravenous contrast. COMPARISON:  07/13/2016 FINDINGS: Brain: There is no evidence of acute cortical infarct, intracranial hemorrhage, mass, midline shift, or extra-axial fluid collection. A tiny chronic  left cerebellar infarct is again seen. There is unchanged moderate enlargement of the lateral and third ventricles and sylvian fissures with mild sulcal enlargement, and this may reflect central predominant cerebral atrophy or potentially normal pressure hydrocephalus in the appropriate clinical setting. Cerebral white matter hypodensities are unchanged and nonspecific but compatible with moderate chronic small vessel ischemic disease. Vascular: Calcified atherosclerosis at the skullbase. No hyperdense vessel. Skull: No fracture or focal osseous lesion. Sinuses/Orbits: Paranasal sinuses and mastoid air cells are clear. Bilateral cataract extraction. Other: None. ASPECTS [facility] Stroke Program Early CT Score) - Ganglionic level infarction (caudate, lentiform nuclei, internal capsule, insula, M1-M3 cortex): 7 - Supraganglionic infarction (M4-M6 cortex): 3 Total score (0-10 with 10 being normal): 10 IMPRESSION: 1. No evidence of acute intracranial abnormality. 2. ASPECTS is 10. 3. Moderate chronic small vessel ischemic disease. Results sent via text page to Dr. Wendee Beavers on 07/28/2016 at 3:39 p.m. Electronically Signed   By: Logan Bores M.D.   On: 07/28/2016 15:39    Time Spent in minutes  30   Dillon Mcreynolds K M.D on 07/29/2016 at 1:37 PM  Between 7am to 7pm - Pager - [phone]  After 7pm go to [email or web address] - password [email or web address]  [facility] -  Office  [phone]

## 2016-07-29 NOTE — Discharge Instructions (Signed)
Follow with Primary MD GREEN, EDWIN JAY, MD in 7 days   Get CBC, CMP, 2 view Chest X ray checked  by Primary MD or SNF MD in 5-7 days ( we routinely change or add medications that can affect your baseline labs and fluid status, therefore we recommend that you get the mentioned basic workup next visit with your PCP, your PCP may decide not to get them or add new tests based on their clinical decision)  Activity: As tolerated with Full fall precautions use walker/cane & assistance as needed  Disposition Home    Diet:    Heart Healthy    For Heart failure patients - Check your Weight same time everyday, if you gain over 2 pounds, or you develop in leg swelling, experience more shortness of breath or chest pain, call your Primary MD immediately. Follow Cardiac Low Salt Diet and 1.5 lit/day fluid restriction.  On your next visit with your primary care physician please Get Medicines reviewed and adjusted.  Please request your Prim.MD to go over all Hospital Tests and Procedure/Radiological results at the follow up, please get all Hospital records sent to your Prim MD by signing hospital release before you go home.  If you experience worsening of your admission symptoms, develop shortness of breath, life threatening emergency, suicidal or homicidal thoughts you must seek medical attention immediately by calling 911 or calling your MD immediately  if symptoms less severe.  You Must read complete instructions/literature along with all the possible adverse reactions/side effects for all the Medicines you take and that have been prescribed to you. Take any new Medicines after you have completely understood and accpet all the possible adverse reactions/side effects.   Do not drive, operate heavy machinery, perform activities at heights, swimming or participation in water activities or provide baby sitting services if your were admitted for syncope or siezures until you have seen by Primary MD or a  Neurologist and advised to do so again.  Do not drive when taking Pain medications.    Do not take more than prescribed Pain, Sleep and Anxiety Medications  Special Instructions: If you have smoked or chewed Tobacco  in the last 2 yrs please stop smoking, stop any regular Alcohol  and or any Recreational drug use.  Wear Seat belts while driving.   Please note  You were cared for by a hospitalist during your hospital stay. If you have any questions about your discharge medications or the care you received while you were in the hospital after you are discharged, you can call the unit and asked to speak with the hospitalist on call if the hospitalist that took care of you is not available. Once you are discharged, your primary care physician will handle any further medical issues. Please note that NO REFILLS for any discharge medications will be authorized once you are discharged, as it is imperative that you return to your primary care physician (or establish a relationship with a primary care physician if you do not have one) for your aftercare needs so that they can reassess your need for medications and monitor your lab values.

## 2016-07-29 NOTE — Evaluation (Signed)
Physical Therapy Evaluation Patient Details Name: Kenneth Bowen MRN: [id number] DOB: April 28, 1931 Today's Date: 07/29/2016   History of Present Illness  Pt is an 83 y/o male who was recently admitted for TIA workup snf f/v home on 07/13/16.  Pt returns to the ED with an episode of unresponsiveness while standing against the wall. MRI negative for any acute changes.  Clinical Impression  Pt admitted with above diagnosis. Pt currently with functional limitations due to the deficits listed below (see PT Problem List). At the time of PT eval pt was able to perform transfers and ambulation with gross supervision for safety (occasional min guard assist provided). Overall, pt with some mild balance and strength deficits that are reportedly  persisting/worsening since a fall over the summer. Feel he could benefit from continued physical therapy at an outpatient level to improve safety with daily mobility/ADL's.  Pt will benefit from skilled PT to increase their independence and safety with mobility to allow discharge to the venue listed below.       Follow Up Recommendations Outpatient PT    Equipment Recommendations  None recommended by PT    Recommendations for Other Services       Precautions / Restrictions Precautions Precautions: Fall Restrictions Weight Bearing Restrictions: No      Mobility  Bed Mobility               General bed mobility comments: Pt sitting up in recliner upon PT arrival  Transfers Overall transfer level: Needs assistance Equipment used: None Transfers: Sit to/from Stand Sit to Stand: Supervision         General transfer comment: Increased time required. Supervision for safety.   Ambulation/Gait Ambulation/Gait assistance: Min guard;Supervision Ambulation Distance (Feet): 300 Feet Assistive device: None Gait Pattern/deviations: Step-through pattern;Decreased stride length;Trunk flexed Gait velocity: Decreased Gait velocity interpretation: Below  normal speed for age/gender General Gait Details: Grossly supervision but required min guard during higher level balance tasks.   Stairs Stairs: Yes Stairs assistance: Supervision Stair Management: One rail Left;Step to pattern;Forwards Number of Stairs: 5 General stair comments: states and demonstrates he can do step over step (alternating,) but prefers step to sequence and encouraged for safety  Wheelchair Mobility    Modified Rankin (Stroke Patients Only) Modified Rankin (Stroke Patients Only) Pre-Morbid Rankin Score: No significant disability Modified Rankin: Moderately severe disability     Balance Overall balance assessment: Needs assistance Sitting-balance support: Feet supported;No upper extremity supported Sitting balance-Leahy Scale: Fair     Standing balance support: No upper extremity supported;During functional activity Standing balance-Leahy Scale: Fair                       Dynamic Gait Index Level Surface: Mild Impairment Gait and Pivot Turn: Mild Impairment Step Over Obstacle: Mild Impairment Step Around Obstacles: Mild Impairment Steps: Moderate Impairment       Pertinent Vitals/Pain Pain Assessment: No/denies pain    Home Living Family/patient expects to be discharged to:: Private residence Living Arrangements: Spouse/significant other Available Help at Discharge: Family;Available 24 hours/day Type of Home: House Home Access: Stairs to enter Entrance Stairs-Rails: Psychiatric nurse of Steps: 2 Home Layout: Two level;Bed/bath upstairs;Full bath on main level Home Equipment: Walker - 4 wheels;Kasandra Knudsen - single point (wife uses rollator)      Prior Function Level of Independence: Independent         Comments: fell about a year ago getting out at [employer] and has had some balance issues since  Hand Dominance   Dominant Hand: Right    Extremity/Trunk Assessment   Upper Extremity Assessment Upper Extremity  Assessment: Defer to OT evaluation    Lower Extremity Assessment Lower Extremity Assessment: Overall WFL for tasks assessed (Pt reports B weakness (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))    Cervical / Trunk Assessment Cervical / Trunk Assessment: Other exceptions Cervical / Trunk Exceptions:  (Forward head/rounded shoulder posture)  Communication   Communication: No difficulties  Cognition Arousal/Alertness: Awake/alert Behavior During Therapy: WFL for tasks assessed/performed Overall Cognitive Status: Within Functional Limits for tasks assessed                      General Comments      Exercises     Assessment/Plan    PT Assessment Patent does not need any further PT services  PT Problem List         PT Treatment Interventions      PT Goals (Current goals can be found in the Care Plan section)  Acute Rehab PT Goals Patient Stated Goal: Get back to exercising routinely PT Goal Formulation: With patient Time For Goal Achievement: 08/05/16 Potential to Achieve Goals: Good    Frequency     Barriers to discharge        Co-evaluation               End of Session Equipment Utilized During Treatment: Gait belt Activity Tolerance: Patient tolerated treatment well Patient left: in bed;with call bell/phone within reach Nurse Communication: Mobility status PT Visit Diagnosis: Unsteadiness on feet (R26.81)    Functional Assessment Tool Used: AM-PAC 6 Clicks Basic Mobility Functional Limitation: Mobility: Walking and moving around Mobility: Walking and Moving Around Current Status (T6144): At least 20 percent but less than 40 percent impaired, limited or restricted Mobility: Walking and Moving Around Goal Status [phone]): At least 1 percent but less than 20 percent impaired, limited or restricted    Time: 1121-1149 PT Time Calculation (min) (ACUTE ONLY): 28 min   Charges:   PT Evaluation $PT Eval Moderate Complexity: 1 Procedure PT Treatments $Gait  Training: 8-22 mins   PT G Codes:   PT G-Codes **NOT FOR INPATIENT CLASS** Functional Assessment Tool Used: AM-PAC 6 Clicks Basic Mobility Functional Limitation: Mobility: Walking and moving around Mobility: Walking and Moving Around Current Status (G8676): At least 20 percent but less than 40 percent impaired, limited or restricted Mobility: Walking and Moving Around Goal Status [phone]): At least 1 percent but less than 20 percent impaired, limited or restricted     Thelma Comp 07/29/2016, 1:37 PM   Rolinda Roan, PT, DPT Acute Rehabilitation Services Pager: [phone]

## 2016-07-30 ENCOUNTER — Inpatient Hospital Stay (HOSPITAL_COMMUNITY): Payer: Medicare Other

## 2016-07-30 DIAGNOSIS — R4189 Other symptoms and signs involving cognitive functions and awareness: Secondary | ICD-10-CM

## 2016-07-30 LAB — GLUCOSE, CAPILLARY: GLUCOSE-CAPILLARY: 87 mg/dL (ref 65–99)

## 2016-07-30 LAB — URINE CULTURE: Culture: NO GROWTH

## 2016-07-30 MED ORDER — DIVALPROEX SODIUM ER 500 MG PO TB24
500.0000 mg | ORAL_TABLET | Freq: Every day | ORAL | Status: DC
  Filled 2016-07-30: qty 1

## 2016-07-30 MED ORDER — DIVALPROEX SODIUM ER 500 MG PO TB24: 500.0000 mg | ORAL_TABLET | Freq: Every day | ORAL | 0 refills | Status: DC

## 2016-07-30 NOTE — Care Management Note (Signed)
Case Management Note  Patient Details  Name: Kenneth Bowen MRN: [id number] Date of Birth: [birth year]  Subjective/Objective:       Patient was admitted following an episode of unresponsiveness. Lives at home with spouse. CM will follow for discharge needs pending patient's progress and physician orders.          Action/Plan:   Expected Discharge Date:                  Expected Discharge Plan:     In-House Referral:     Discharge planning Services     Post Acute Care Choice:    Choice offered to:     DME Arranged:    DME Agency:     HH Arranged:    HH Agency:     Status of Service:     If discussed at [employer] of Stay Meetings, dates discussed:    Additional Comments:  Rolm Baptise, RN 07/30/2016, 9:37 AM

## 2016-07-30 NOTE — Discharge Summary (Signed)
Kenneth Bowen Hands EZM:[id number] DOB: 1930/11/18 DOA: 07/28/2016  PCP: Criselda Peaches, MD  Admit date: 07/28/2016  Discharge date: 07/30/2016  Admitted From: Home   Disposition:  Home   Recommendations for Outpatient Follow-up:   Follow up with PCP in 1-2 weeks  PCP Please obtain BMP/CBC, 2 view CXR in 1week,  (see Discharge instructions)   PCP Please follow up on the following pending results: None   Home Health: None   Equipment/Devices: None  Consultations: Neuro Discharge Condition: Stable   CODE STATUS: Full   Diet Recommendation:  Heart Healthy    Chief Complaint  Patient presents with  . Code Stroke    LKW 1415     Brief history of present illness from the day of admission and additional interim summary     Kenneth Bowen Basingeris a 83 y.o.malewith medical history significant for BPH, dyslipidemia, recent hospitalization for acute neurologic deficit (lightheadedness) and has had TIA work and discharged home 07/13/2016. He presented to [facility] after he was found standing up against the wall and not responding to his wife when she called his name                                                                  Hospital Course   1.Recent mental status. Unclear etiology. 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 EEG was non acute, was seen by Neuro who wanted to add Depakote upon DC, PT, symptom free, ambulating without any distress & back to his baseline. Cleared by Neuro for DC with outpt Neuro follow up.  2. BPH. Continue Flomax.  3. Dyslipidemia. On on a 3 supplementation continue.  4. Anxiety. Continue as needed Xanax.     Discharge diagnosis      Principal Problem:   Unresponsiveness Active Problems:   Anxiety   BPH (benign prostatic hyperplasia)   Dyslipidemia   Syncope    Discharge instructions    Discharge Instructions    Ambulatory referral to Neurology    Complete by:  As directed    Seizure vs migraine patient. Dr. Leonie Man saw in the hospital - follow up preferred provider at [facility]. Prefers follow up in 4 weeks   Diet - low sodium heart healthy    Complete by:  As directed    Discharge instructions    Complete by:  As directed    Follow with Primary MD GREEN, EDWIN JAY, MD in 7 days   Get CBC, CMP, 2 view Chest X ray checked  by Primary MD or SNF MD in 5-7 days ( we routinely change or add medications that can affect your baseline labs and fluid status, therefore we recommend that  you get the mentioned basic workup next visit with your PCP, your PCP may decide not to get them or add new tests based on their clinical decision)  Activity: As tolerated with Full fall precautions use walker/cane & assistance as needed  Disposition Home    Diet:    Heart Healthy    For Heart failure patients - Check your Weight same time everyday, if you gain over 2 pounds, or you develop in leg swelling, experience more shortness of breath or chest pain, call your Primary MD immediately. Follow Cardiac Low Salt Diet and 1.5 lit/day fluid restriction.  On your next visit with your primary care physician please Get Medicines reviewed and adjusted.  Please request your Prim.MD to go over all Hospital Tests and Procedure/Radiological results at the follow up, please get all Hospital records sent to your Prim MD by signing hospital release before you go home.  If you experience worsening of your admission symptoms, develop shortness of breath, life threatening emergency, suicidal or homicidal thoughts you must seek medical attention immediately by calling 911 or calling your MD immediately  if symptoms less severe.  You Must read complete  instructions/literature along with all the possible adverse reactions/side effects for all the Medicines you take and that have been prescribed to you. Take any new Medicines after you have completely understood and accpet all the possible adverse reactions/side effects.   Do not drive, operate heavy machinery, perform activities at heights, swimming or participation in water activities or provide baby sitting services if your were admitted for syncope or siezures until you have seen by Primary MD or a Neurologist and advised to do so again.  Do not drive when taking Pain medications.    Do not take more than prescribed Pain, Sleep and Anxiety Medications  Special Instructions: If you have smoked or chewed Tobacco  in the last 2 yrs please stop smoking, stop any regular Alcohol  and or any Recreational drug use.  Wear Seat belts while driving.   Please note  You were cared for by a hospitalist during your hospital stay. If you have any questions about your discharge medications or the care you received while you were in the hospital after you are discharged, you can call the unit and asked to speak with the hospitalist on call if the hospitalist that took care of you is not available. Once you are discharged, your primary care physician will handle any further medical issues. Please note that NO REFILLS for any discharge medications will be authorized once you are discharged, as it is imperative that you return to your primary care physician (or establish a relationship with a primary care physician if you do not have one) for your aftercare needs so that they can reassess your need for medications and monitor your lab values.   Increase activity slowly    Complete by:  As directed       Discharge Medications   Allergies as of 07/30/2016   No Known Allergies     Medication List    STOP taking these medications   amoxicillin-clavulanate 875-125 MG tablet Commonly known as:  AUGMENTIN       TAKE these medications   ALPRAZolam 1 MG tablet Commonly known as:  XANAX Take 1 mg by mouth at bedtime.   ARTIFICIAL TEAR OP Place 1 drop into both eyes daily as needed. For dry eyes   aspirin 81 MG tablet Take 1 tablet (81 mg total) by mouth daily. Over  the counter   cetirizine 10 MG tablet Commonly known as:  ZYRTEC Take 10 mg by mouth daily.   cholecalciferol 1000 units tablet Commonly known as:  VITAMIN D Take 1,000 Units by mouth 2 (two) times daily.   divalproex 500 MG 24 hr tablet Commonly known as:  DEPAKOTE ER Take 1 tablet (500 mg total) by mouth daily.   Fish Oil 1000 MG Caps Take 1,000 mg by mouth daily.   IRON PO Take 1 tablet by mouth See admin instructions. Take couple times a week per patient. No specific days   multivitamin tablet Take 1 tablet by mouth daily.   tamsulosin 0.4 MG Caps capsule Commonly known as:  FLOMAX TAKE 1 CAPSULE (0.4 MG TOTAL) BY MOUTH DAILY AFTER SUPPER.   traZODone 50 MG tablet Commonly known as:  DESYREL Take 25 mg by mouth at bedtime.   TURMERIC PO Take 1 tablet by mouth daily.       Follow-up Information    GREEN, [name], MD. Schedule an appointment as soon as possible for a visit in 1 week(s).   Specialty:  Internal Medicine Contact information: [place] [address] [phone]        [facility]. Schedule an appointment as soon as possible for a visit in 4 week(s).   Contact information: [address]     [place] [id number] [phone]          Major procedures and Radiology Reports - PLEASE review detailed and final reports thoroughly  -       MRI brain - non acute  EEG - Non specific findings  Ct Angio Head W Or Wo Contrast   Dg Chest 2 View  Result Date: 07/28/2016 CLINICAL DATA:  Blurred vision.  Minor headache. EXAM: CHEST  2 VIEW COMPARISON:  03/13/2016. FINDINGS: Normal sized heart. Tortuous  aorta. Mild diffuse peribronchial thickening without significant change. Thoracic spine degenerative changes. Stable 20% T12 superior endplate compression deformity. IMPRESSION: No acute abnormality.  Stable mild chronic bronchitic changes. Electronically Signed   By: Claudie Revering M.D.   On: 07/28/2016 17:10   Mr Brain Wo Contrast  Result Date: 07/28/2016 CLINICAL DATA:  Transient loss of mentation and bowel control. EXAM: MRI HEAD WITHOUT CONTRAST TECHNIQUE: Multiplanar, multiecho pulse sequences of the brain and surrounding structures were obtained without intravenous contrast. COMPARISON:  Head CT earlier today and MRI 07/12/2016. Head and neck CTA 07/13/2016. FINDINGS: Brain: There is no evidence of acute infarct, intracranial hemorrhage, mass, midline shift, or extra-axial fluid collection. There is moderate enlargement of the lateral and third ventricles and sylvian fissures with only mild cerebral sulcal enlargement, and this may reflect central predominant cerebral atrophy or potentially normal pressure hydrocephalus in the appropriate clinical setting, with atrophy favored. Patchy T2 hyperintensities in the periventricular greater than subcortical cerebral white matter and in the pons are nonspecific but compatible with moderate chronic small vessel ischemic disease. A chronic left cerebellar infarct is again noted. Vascular: Major intracranial arterial flow voids are preserved. Unchanged abnormal T2 and FLAIR hyperintensity throughout the transverse sinus, sigmoid sinus, and jugular bulb on the right. Skull and upper cervical spine: Unremarkable bone marrow signal. Sinuses/Orbits: Bilateral cataract extraction. Clear paranasal sinuses. Small left mastoid effusion. Other: None. IMPRESSION: 1. No acute intracranial abnormality. 2. Moderate chronic small vessel ischemic disease. Chronic left cerebellar infarct. 3. Stable abnormal signal in the right transverse and sigmoid sinuses and right jugular bulb.  Given  the appearance on the prior CTA, this may reflect chronic partial thrombosis. Electronically Signed   By: Logan Bores M.D.   On: 07/28/2016 18:55     Ct Head Code Stroke W/o Cm  Result Date: 07/28/2016 CLINICAL DATA:  Code stroke. Confusion, slurred speech, and right-sided weakness. EXAM: CT HEAD WITHOUT CONTRAST TECHNIQUE: Contiguous axial images were obtained from the base of the skull through the vertex without intravenous contrast. COMPARISON:  07/13/2016 FINDINGS: Brain: There is no evidence of acute cortical infarct, intracranial hemorrhage, mass, midline shift, or extra-axial fluid collection. A tiny chronic left cerebellar infarct is again seen. There is unchanged moderate enlargement of the lateral and third ventricles and sylvian fissures with mild sulcal enlargement, and this may reflect central predominant cerebral atrophy or potentially normal pressure hydrocephalus in the appropriate clinical setting. Cerebral white matter hypodensities are unchanged and nonspecific but compatible with moderate chronic small vessel ischemic disease. Vascular: Calcified atherosclerosis at the skullbase. No hyperdense vessel. Skull: No fracture or focal osseous lesion. Sinuses/Orbits: Paranasal sinuses and mastoid air cells are clear. Bilateral cataract extraction. Other: None. ASPECTS [facility] Stroke Program Early CT Score) - Ganglionic level infarction (caudate, lentiform nuclei, internal capsule, insula, M1-M3 cortex): 7 - Supraganglionic infarction (M4-M6 cortex): 3 Total score (0-10 with 10 being normal): 10 IMPRESSION: 1. No evidence of acute intracranial abnormality. 2. ASPECTS is 10. 3. Moderate chronic small vessel ischemic disease. Results sent via text page to Dr. Wendee Beavers on 07/28/2016 at 3:39 p.m. Electronically Signed   By: Logan Bores M.D.   On: 07/28/2016 15:39    Micro Results     Recent Results (from the past 240 hour(s))  Urine culture     Status: None   Collection Time: 07/28/16  9:52  PM  Result Value Ref Range Status   Specimen Description URINE, CLEAN CATCH  Final   Special Requests NONE  Final   Culture NO GROWTH  Final   Report Status 07/30/2016 FINAL  Final    Today   Subjective    Kenneth Bowen today has no headache,no chest abdominal pain,no new weakness tingling or numbness, feels much better wants to go home today.     Objective   Blood pressure 114/62, pulse 61, temperature 98.4 F (36.9 C), temperature source Oral, resp. rate 18, height 5' 10.5" (1.791 m), weight 82.5 kg (181 lb 14.4 oz), SpO2 97 %.   Intake/Output Summary (Last 24 hours) at 07/30/16 1452 Last data filed at 07/30/16 0000  Gross per 24 hour  Intake              240 ml  Output             1100 ml  Net             -860 ml    Exam Awake Alert, Oriented x 3, No new F.N deficits, Normal affect [place].AT,PERRAL Supple Neck,No JVD, No cervical lymphadenopathy appriciated.  Symmetrical Chest wall movement, Good air movement bilaterally, CTAB RRR,No Gallops,Rubs or new Murmurs, No Parasternal Heave +ve B.Sounds, Abd Soft, Non tender, No organomegaly appriciated, No rebound -guarding or rigidity. No Cyanosis, Clubbing or edema, No new Rash or bruise   Data Review   CBC w Diff:  Lab Results  Component Value Date   WBC 4.7 07/29/2016   HGB 12.1 (L) 07/29/2016   HCT 36.4 (L) 07/29/2016   PLT 260 07/29/2016   LYMPHOPCT 36 07/28/2016   MONOPCT 7 07/28/2016   EOSPCT 4 07/28/2016   BASOPCT  1 07/28/2016    CMP:  Lab Results  Component Value Date   NA 137 07/29/2016   K 3.7 07/29/2016   CL 105 07/29/2016   CO2 27 07/29/2016   BUN 11 07/29/2016   CREATININE 0.92 07/29/2016   PROT 6.0 (L) 07/29/2016   ALBUMIN 3.4 (L) 07/29/2016   BILITOT 0.6 07/29/2016   ALKPHOS 53 07/29/2016   AST 24 07/29/2016   ALT 18 07/29/2016  .   Total Time in preparing paper work, data evaluation and todays exam - 35 minutes  Thurnell Lose M.D on 07/30/2016 at 2:52 PM  [facility]     Office  [phone]

## 2016-07-30 NOTE — Procedures (Signed)
ELECTROENCEPHALOGRAM REPORT  Date of Study: 07/30/2016  Patient's Name: Kenneth Bowen MRN: [id number] Date of Birth: 10/17/1960  Referring Provider: Dr. Leisa Lenz  Clinical History: This is an 83 year old man with an episode of unresponsiveness.  Medications: acetaminophen (TYLENOL) tablet 650 mg  ALPRAZolam (XANAX) tablet 1 mg  aspirin EC tablet 81 mg  cholecalciferol (VITAMIN D) tablet 1,000 Units  heparin injection 5,000 Units  loratadine (CLARITIN) tablet 10 mg  multivitamin with minerals tablet 1 tablet  omega-3 acid ethyl esters (LOVAZA) capsule 1 g  tamsulosin (FLOMAX) capsule 0.4 mg  traZODone (DESYREL) tablet 50 mg   Technical Summary: A multichannel digital sleep-deprived EEG recording measured by the international 10-20 system with electrodes applied with paste and impedances below 5000 ohms performed in our laboratory with EKG monitoring in a predominantly drowsy and asleep patient.  Hyperventilation was not performed. Photic stimulation was performed.  The digital EEG was referentially recorded, reformatted, and digitally filtered in a variety of bipolar and referential montages for optimal display.    Description: The patient is predominantly drowsy and asleep during the recording.  During maximal wakefulness, there is a symmetric, medium voltage 10 Hz posterior dominant rhythm that poorly attenuates with eye opening and eye closure.  The record is symmetric.  There is an excess amount of diffuse low voltage beta activity seen throughout the recording. During drowsiness and sleep, there is an increase in theta slowing of the background. Vertex waves and symmetric sleep spindles were seen.  Hyperventilation and photic stimulation did not elicit any abnormalities.  There were no epileptiform discharges or electrographic seizures seen.    EKG lead showed sinus bradycardia with frequent extrasystolic beats.  Impression: This predominantly drowsy and asleep EEG is  normal except for excess amount of diffuse low voltage beta activity.  Clinical Correlation: Diffuse low voltage beta activity is commonly seen with sedating medications such as benzodiazepines.  In the absence of sedating medications, anxiety and hyperthyroidism may produce generalized beta activity.  The absence of epileptiform discharges does not exclude a clinical diagnosis of epilepsy.  Clinical correlation is advised.   Ellouise Newer, M.D.

## 2016-07-30 NOTE — Progress Notes (Signed)
EEG completed; results pending.    

## 2016-07-30 NOTE — Progress Notes (Signed)
STROKE TEAM PROGRESS NOTE   HISTORY OF PRESENT ILLNESS (per record) Kenneth Bowen is an 83 y.o. male who was recently admitted for a stroke workup here.  He was at home earlier and wife saw him standing up against the wall unresponsive, staring and had a bowel movement. As the wife is not here I cannot obtain any further history.  The patient himself does not recall the event.  I cancelled the code stroke because pt had a NIHSS = 0 and the sx were not c/w stroke.  SUBJECTIVE (INTERVAL HISTORY) His wife is at the bedside.  Patient reports feeling better now.  He was able to share what his wife described to him as the "tow episodes."  Two weeks ago, patient was leaning over to pick up something and says he may have strained to reach down to get the item.  He says his head was "all the way down."  When he sat up, he apparently became dizzy and had blurred vision.  He then became confused and his wife told him that for 20 minutes he "reached for things that were not there and acted confused."  This lasted for 20 minutes and he returned to normal by the time EMS arrived.  The second event occurred prior to admission.  Patient was sitting upright and did not have a positional change or strain to reach for something.  He had sudden onset of dizziness.  Like the time two weeks before, his wife assisted him to another room but he was confused again.  This time the patient did not "return to clear understanding" until he was in the ambulance.  This was approximately 30-40 minutes after onset.  When asked if this has happened before, patient recalled an event 10 years prior when he suddenly lost his words for a few minutes and an event 15 years prior when he had trouble understanding the words he was reading.  However, no confusion spells of note   OBJECTIVE Temp:  [98.4 F (36.9 C)-98.6 F (37 C)] 98.6 F (37 C) (03/19 1400) Pulse Rate:  [61-74] 74 (03/19 1400) Cardiac Rhythm: Heart block (03/19  0700) Resp:  [18] 18 (03/19 1400) BP: (124)/(75) 124/75 (03/19 1400) SpO2:  [97 %-98 %] 98 % (03/19 1400)  CBC:   Recent Labs Lab 07/28/16 1520 07/28/16 1527 07/29/16 0822  WBC 6.0  --  4.7  NEUTROABS 3.2  --   --   HGB 12.6* 13.3 12.1*  HCT 37.6* 39.0 36.4*  MCV 96.4  --  96.8  PLT 285  --  510    Basic Metabolic Panel:   Recent Labs Lab 07/28/16 1520 07/28/16 1527 07/29/16 0822  NA 136 136 137  K 4.0 3.8 3.7  CL 101 101 105  CO2 25  --  27  GLUCOSE 163* 159* 116*  BUN 18 20 11   CREATININE 0.88 0.90 0.92  CALCIUM 9.5  --  9.0    Lipid Panel:     Component Value Date/Time   CHOL 114 07/13/2016 0231   TRIG 85 07/13/2016 0231   HDL 42 07/13/2016 0231   CHOLHDL 2.7 07/13/2016 0231   VLDL 17 07/13/2016 0231   LDLCALC 55 07/13/2016 0231   HgbA1c:  Lab Results  Component Value Date   HGBA1C 5.8 (H) 07/13/2016   Urine Drug Screen: No results found for: LABOPIA, COCAINSCRNUR, LABBENZ, AMPHETMU, THCU, LABBARB    IMAGING  Dg Chest 2 View 07/28/2016 No acute abnormality.  Stable mild chronic  bronchitic changes.  Mr Brain Wo Contrast 07/28/2016 1. No acute intracranial abnormality.  2. Moderate chronic small vessel ischemic disease. Chronic left cerebellar infarct.  3. Stable abnormal signal in the right transverse and sigmoid sinuses and right jugular bulb. 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    Ct Head Code Stroke W/o Cm 07/28/2016 1. No evidence of acute intracranial abnormality.  2. ASPECTS is 10.  3. Moderate chronic small vessel ischemic disease.   CTA Head and Neck 07/13/2016 CT HEAD: No acute intracranial process. 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  CTA NECK: Atherosclerosis resulting in 50% stenosis RIGHT internal carotid artery origin.  CTA HEAD:  No emergent large vessel occlusion or severe stenosis.  PHYSICAL EXAM General  - Well nourished, well developed, in NAD   Cardiovascular - Regular rate and rhythm Pulmonary: CTA Abdomen: NT, ND, normal bowel sounds Extremities: No C/C/E  Neurological Exam Mental Status: Normal Orientation:  Oriented to person, place and time Speech:  Fluent; no dysarthria   Cranial Nerves:  PERRL; EOMI; visual fields full, face grossly symmetric, hearing grossly intact; shrug symmetric and tongue midline  Motor Exam:  Tone:  Within normal limits; Strength: 5/5 throughout  Sensory: Intact to light touch throughout  Coordination:  Intact finger to nose  Gait: Deferred  ASSESSMENT/PLAN [name] is a 83 y.o. male with history of previous stroke, recent strokeworkup and history of prostate cancer, brought to the emergency department after he was found standing up against a wall, unresponsive, staring, and having had a bowel movement.Marland Kitchen He did not receive IV t-PA due to an NIHSS of 0 and no symptoms consistent with a stroke.  Possible TIA vs Seizure: Patient's post event confusion is concerning for complex partial seizure  Resultant  Symptom free now  MRI - No acute intracranial abnormality.    MRA - not performed  EEG - no seizure activity.  Carotid Doppler - CTA neck 07/13/2016 - 50% stenosis RIGHT internal carotid artery origin.  2D Echo - 07/13/2016 - EF 55-60%. No cardiac source of emboli identified.  LDL - 55  HgbA1c - 5.8  VTE prophylaxis - SCDs Diet regular Room service appropriate? Yes; Fluid consistency: Thin Diet - low sodium heart healthy  aspirin 81 mg daily prior to admission, now on aspirin 81 mg daily  Patient counseled to be compliant with his antithrombotic medications  Ongoing aggressive stroke risk factor management  Therapy recommendations: None  Disposition:  Home Hypertension  Blood pressure tends to run low  Permissive hypertension (OK if < 220/120) but gradually normalize in 5-7 days  Long-term BP goal  normotensive  Hyperlipidemia  Home meds: No lipid lowering medications prior to admission   LDL 55, goal < 70   Other Stroke Risk Factors  Advanced age  ETOH use, advised to drink no more than 1 drink  a day  Overweight, Body mass index is 25.73 kg/m., recommend weight loss, diet and exercise as appropriate   Hx stroke/TIA   Other Active Problems  Hyperglycemia  Hospital day # 1 I have personally examined this patient, reviewed notes, independently viewed imaging studies, participated in medical decision making and plan of care.ROS completed by me personally and pertinent positives fully documented  I have made any additions or clarifications directly to the above note. Patient has had 2 recurrent stereotypical episodes of possible presyncopal symptoms followed by transient neurological dysfunction. Possibilities include Combivir migraine  versus seizures. Recommend trial of Depakote ER 500 mg daily. Follow-up electively as an outpatient. Greater than 50% time during this 25 minute visit was spent on counseling and coordination of care and discussion with patient's wife about these episodes and plan for treatment and answering questions. Discussed with Dr. Candiss Norse.  Antony Contras, MD Medical Director [facility] Pager: [phone] 07/30/2016 5:44 PM  To contact Stroke Continuity provider, please refer to [email or web address] After hours, contact General Neurology

## 2016-07-30 NOTE — Care Management Note (Signed)
Case Management Note  Patient Details  Name: Kenneth Bowen MRN: [id number] Date of Birth: 10-24-1930  Subjective/Objective:                    Action/Plan: Pt discharging home with self care. No f/u per PT and no DME needs. Pt with insurance and PCP. No further needs per CM.   Expected Discharge Date:  07/30/16               Expected Discharge Plan:  Home/Self Care  In-House Referral:     Discharge planning Services     Post Acute Care Choice:    Choice offered to:     DME Arranged:    DME Agency:     HH Arranged:    HH Agency:     Status of Service:  Completed, signed off  If discussed at [employer] of Stay Meetings, dates discussed:    Additional Comments:  Pollie Friar, RN 07/30/2016, 3:29 PM

## 2016-07-30 NOTE — Progress Notes (Signed)
Physical Therapy Treatment Patient Details Name: Kenneth Bowen MRN: [id number] DOB: 1930-11-04 Today's Date: 07/30/2016    History of Present Illness Pt is an 83 y/o male who was recently admitted for TIA workup snf f/v home on 07/13/16.  Pt returns to the ED with an episode of unresponsiveness while standing against the wall. MRI negative for any acute changes.    PT Comments    Patient presents without current vestibular issues and with some minor balance issues.  Did need cues for more normal gait with arm swing and forward gaze.  Discussed his thoughts regarding outpatient PT and states he has exercise bike at home and plans to utilize as well as work on balance/etc.  Not currently fall risk per DGI (21/24, scores less than 19 predicative of falls in older community living adults.)  Feel continued skilled PT until d/c remains appropriate.   Follow up Recommendations No PT follow up     Equipment Recommendations  None recommended by PT    Recommendations for Other Services       Precautions / Restrictions      Mobility  Bed Mobility Overal bed mobility: Modified Independent                Transfers Overall transfer level: Needs assistance   Transfers: Sit to/from Stand Sit to Stand: Supervision         General transfer comment: S for safety, slow to rise and mildly unsteady with legs braced against bed  Ambulation/Gait Ambulation/Gait assistance: Supervision Ambulation Distance (Feet): 400 Feet   Gait Pattern/deviations: Step-through pattern;Decreased stride length;Trunk flexed     General Gait Details: cues for forward gaze, assist for safety, but no LOB, see DGI   Stairs Stairs: Yes   Stair Management: Two rails;Alternating pattern;Forwards Number of Stairs: 4    Wheelchair Mobility    Modified Rankin (Stroke Patients Only) Modified Rankin (Stroke Patients Only) Pre-Morbid Rankin Score: No significant disability Modified Rankin: Moderately  severe disability     Balance     Sitting balance-Leahy Scale: Good     Standing balance support: No upper extremity supported;During functional activity Standing balance-Leahy Scale: Good                      Cognition Arousal/Alertness: Awake/alert Behavior During Therapy: WFL for tasks assessed/performed Overall Cognitive Status: Within Functional Limits for tasks assessed                      Exercises      General Comments   Vestibular Assessment  07/30/16 0001  Symptom Behavior  Type of Dizziness Blurred vision  Frequency of Dizziness once  Duration of Dizziness moments at initiation of symptoms leading to this admission  Aggravating Factors Not applicable  Relieving Factors Not applicable  Occulomotor Exam  Occulomotor Alignment Normal  Spontaneous Absent  Gaze-induced Absent  Smooth Pursuits Intact  Saccades Intact  Vestibulo-Occular Reflex  VOR 1 Head Only (x 1 viewing) intact and no symptoms with vertical and horizontal head movements x 20 sec  VOR Cancellation Normal  Positional Testing  Sidelying Test Sidelying Right;Sidelying Left  Sidelying Right  Sidelying Right Duration 30 sec  Sidelying Right Symptoms No nystagmus  Sidelying Left  Sidelying Left Duration 30 sec  Sidelying Left Symptoms No nystagmus        Pertinent Vitals/Pain Pain Assessment: No/denies pain    Home Living  Prior Function            PT Goals (current goals can now be found in the care plan section) Progress towards PT goals: Progressing toward goals    Frequency           PT Plan Discharge plan needs to be updated    Co-evaluation             End of Session Equipment Utilized During Treatment: Gait belt Activity Tolerance: Patient tolerated treatment well Patient left: in chair;with call bell/phone within reach;with chair alarm set   PT Visit Diagnosis: Unsteadiness on feet (R26.81)     Time:  5947-0761 PT Time Calculation (min) (ACUTE ONLY): 24 min  Charges:  $Gait Training: 8-22 mins $Physical Performance Test: 8-22 mins                    G Codes:       Reginia Naas 2016/08/06, [place], [place] 08/06/2016

## 2016-07-30 NOTE — Progress Notes (Signed)
D/c instructions reviewed with patient. Assisted PT to family vehicle.

## 2016-09-05 ENCOUNTER — Ambulatory Visit (INDEPENDENT_AMBULATORY_CARE_PROVIDER_SITE_OTHER): Payer: Medicare Other | Admitting: Neurology

## 2016-09-05 ENCOUNTER — Encounter: Payer: Self-pay | Admitting: Neurology

## 2016-09-05 DIAGNOSIS — G40009 Localization-related (focal) (partial) idiopathic epilepsy and epileptic syndromes with seizures of localized onset, not intractable, without status epilepticus: Secondary | ICD-10-CM

## 2016-09-05 MED ORDER — DIVALPROEX SODIUM ER 500 MG PO TB24: 500.0000 mg | ORAL_TABLET | Freq: Every day | ORAL | 11 refills | Status: DC

## 2016-09-05 NOTE — Patient Instructions (Signed)
I had a long discussion the patient with regards to his 2 episodes of brief altered consciousness likely representing complex partial seizures. I recommend he continue on Depakote ER 500 mg daily. Check repeat EEG study. Patient was counseled not to drive for 6 months as per [facility]. He was also advised to stay away from seizure provoking stimuli like sleep deprivation, alcohol, stimulants and be compliant with medications. He will return for follow-up in 3 months or call earlier if necessary

## 2016-09-05 NOTE — Progress Notes (Signed)
[facility] [address] [place]. [place] [address] [phone]       OFFICE FOLLOW-UP NOTE  [name] Date of Birth:  May 18, 1930 Medical Record Number:  [id number]   HPI: Mr Kenneth Bowen is a 83 year Caucasian male seen today for first office follow-up visit for in-hospital with admission in March 2018 for episode of brief loss of awareness. History is obtained from the patient, review of electronic medical records and have personally reviewed imaging for films. Kenneth Haskew Basingeris an 83 y.o.malewho was  at home earlier and wife saw him standing up against the wall unresponsive, staring and had a bowel movement.    He was able to share what his wife described as another similar episodes  Two weeks ago, patient was leaning over to pick up something and says he may have strained to reach down to get the item.  He says his head was "all the way down."  When he sat up, he apparently became dizzy and had blurred vision.  He then became confused and his wife told him that for 20 minutes he "reached for things that were not there and acted confused."  This lasted for 20 minutes and he returned to normal by the time EMS arrived.  The second event occurred prior to admission.  Patient was sitting upright and did not have a positional change or strain to reach for something.  He had sudden onset of dizziness.  Like the time two weeks before, his wife assisted him to another room but he was confused again.  This time the patient did not "return to clear understanding" until he was in the ambulance.  This was approximately 30-40 minutes after onset.  When asked if this has happened before, patient recalled an event 10 years prior when he suddenly lost his words for a few minutes and an event 15 years prior when he had trouble understanding the words he was reading.  However, no confusion spells of note. CT scan of the brain on admission was unremarkable and CT angiogram showed no large  vessel stenosis or occlusion. EEG was obtained which showed no definite seizure activity. Transthoracic echo showed normal ejection fraction. LDL cholesterol was 55 mg percent. Hemoglobin A1c was 5.8. Patient was started on a trial of Depakote ER 500 mg daily. He states he tolerated well but he ran out of a week ago has not filled it yet. He complained of increased frequency of soft stools about 3 times a day he felt this may be related to Depakote. He has had no further recurrent episodes. Denies any prior history of significant head injury with loss of consciousness, childhood epilepsy or other seizures.   ROS:   14 system review of systems is positive for  impotence only all other systems negative  PMH:  Past Medical History:  Diagnosis Date  . Colon polyps   . Diverticulosis   . Prostate cancer [facility]) 2015   radiation finished Nov 2015    Social History:  Social History   Social History  . Marital status: Married    Spouse name: N/A  . Number of children: N/A  . Years of education: N/A   Occupational History  . civil Chief Financial Officer    Social History Main Topics  . Smoking status: Never Smoker  . Smokeless tobacco: Never Used  . Alcohol use 4.2 oz/week    7 Glasses of wine per week     Comment: 4 oz nightly  . Drug  use: No  . Sexual activity: Yes   Other Topics Concern  . Not on file   Social History Narrative  . No narrative on file    Medications:   Current Outpatient Prescriptions on File Prior to Visit  Medication Sig Dispense Refill  . ALPRAZolam (XANAX) 1 MG tablet Take 1 mg by mouth at bedtime.     . ARTIFICIAL TEAR OP Place 1 drop into both eyes daily as needed. For dry eyes    . aspirin 81 MG tablet Take 1 tablet (81 mg total) by mouth daily. Over the counter 30 tablet 3  . cetirizine (ZYRTEC) 10 MG tablet Take 10 mg by mouth daily.    . cholecalciferol (VITAMIN D) 1000 UNITS tablet Take 1,000 Units by mouth 2 (two) times daily.     . IRON PO Take 1 tablet by  mouth See admin instructions. Take couple times a week per patient. No specific days    . Multiple Vitamin (MULTIVITAMIN) tablet Take 1 tablet by mouth daily.    . Omega-3 Fatty Acids (FISH OIL) 1000 MG CAPS Take 1,000 mg by mouth daily.    . tamsulosin (FLOMAX) 0.4 MG CAPS capsule TAKE 1 CAPSULE (0.4 MG TOTAL) BY MOUTH DAILY AFTER SUPPER. 30 capsule 5  . traZODone (DESYREL) 50 MG tablet Take 25 mg by mouth at bedtime.      No current facility-administered medications on file prior to visit.     Allergies:  No Known Allergies  Physical Exam General: Frail elderly Caucasian male, seated, in no evident distress Head: head normocephalic and atraumatic.  Neck: supple with no carotid or supraclavicular bruits Cardiovascular: regular rate and rhythm, no murmurs Musculoskeletal: no deformity Skin:  no rash/petichiae Vascular:  Normal pulses all extremities There were no vitals filed for this visit. Neurologic Exam Mental Status: Awake and fully alert. Oriented to place and time. Recent and remote memory intact. Attention span, concentration and fund of knowledge appropriate. Mood and affect appropriate.  Cranial Nerves: Fundoscopic exam reveals sharp disc margins. Pupils equal, briskly reactive to light. Extraocular movements full without nystagmus. Visual fields full to confrontation. Hearing diminished bilaterally Facial sensation intact. Face, tongue, palate moves normally and symmetrically.  Motor: Normal bulk and tone. Normal strength in all tested extremity muscles. Sensory.: intact to touch ,pinprick .position and vibratory sensation.  Coordination: Rapid alternating movements normal in all extremities. Finger-to-nose and heel-to-shin performed accurately bilaterally. Gait and Station: Arises from chair without difficulty. Stance is normal. Gait demonstrates normal stride length and balance . Unable to heel, toe and tandem walk without difficulty.  Reflexes: 1+ and symmetric. Toes  downgoing.       ASSESSMENT: 83 year Caucasian male with 2 episodes in a 3 week period of brief altered consciousness possible Compex partial seizures. Complicated migraine or TIA is less likely.    PLAN: I had a long discussion the patient with regards to his 2 episodes of brief altered consciousness likely representing complex partial seizures. I recommend he continue on Depakote ER 500 mg daily. Check repeat EEG study. Patient was counseled not to drive for 6 months as per [facility]. He was also advised to stay away from seizure provoking stimuli like sleep deprivation, alcohol, stimulants and be compliant with medications. He will return for follow-up in 3 months or call earlier if necessary Greater than 50% of time during this 25 minute visit was spent on counseling,explanation of diagnosis, planning of further management, discussion with patient and family and coordination  of care Antony Contras, MD  [facility] [address] [place], [place] [address]  Phone [phone] Fax [phone] Note: This document was prepared with digital dictation and possible smart phrase technology. Any transcriptional errors that result from this process are unintentional

## 2016-09-11 ENCOUNTER — Ambulatory Visit (INDEPENDENT_AMBULATORY_CARE_PROVIDER_SITE_OTHER): Payer: Medicare Other

## 2016-09-11 DIAGNOSIS — G40009 Localization-related (focal) (partial) idiopathic epilepsy and epileptic syndromes with seizures of localized onset, not intractable, without status epilepticus: Secondary | ICD-10-CM

## 2016-10-01 ENCOUNTER — Telehealth: Payer: Self-pay | Admitting: *Deleted

## 2016-10-01 NOTE — Telephone Encounter (Signed)
Mobile # mailbox full; unable to LVM. Called home phone and spoke with patient , informed him that EEG results are normal. He stated he saw results on EEG  On his my chart. He questioned if he still could not drive for 6 months. This RN stated that is correct, per  law he cannot drive until 6 months free of any events and under a physicians care. Patient verbalized understanding, appreciation of call.

## 2016-12-05 ENCOUNTER — Encounter: Payer: Self-pay | Admitting: Nurse Practitioner

## 2016-12-05 ENCOUNTER — Ambulatory Visit (INDEPENDENT_AMBULATORY_CARE_PROVIDER_SITE_OTHER): Payer: Medicare Other | Admitting: Nurse Practitioner

## 2016-12-05 DIAGNOSIS — G40209 Localization-related (focal) (partial) symptomatic epilepsy and epileptic syndromes with complex partial seizures, not intractable, without status epilepticus: Secondary | ICD-10-CM | POA: Insufficient documentation

## 2016-12-05 NOTE — Progress Notes (Signed)
[facility]  PATIENT: Kenneth Bowen DOB: 1930/12/20   REASON FOR VISIT: Follow-up for complex partial seizure disorder HISTORY FROM: Patient    HISTORY OF PRESENT ILLNESS UPDATE 07/25/2018CM. Kenneth Bowen, 83 year old male returns for follow-up with history of complex partial seizure disorder. He was placed on Depakote 500mg  after having 2 events where he was unresponsive staring had a bowel movement second event occurred when he became dizzy had blurred vision and he was confused  for about 20 minutes and then returned normal. Patient now recalls an event 10 years ago when he suddenly loss his words for a few minutes and 15 years ago he had trouble understanding the words that he was reading. He denies any side effects to the Depakote and he has not had further events. He returns for reevaluation    :09/05/16 PSMr Ress is a 46 year Caucasian male seen today for first office follow-up visit for in-hospital with admission in March 2018 for episode of brief loss of awareness. History is obtained from the patient, review of electronic medical records and have personally reviewed imaging for films. Kenneth Housholder Basingeris an 83 y.o.malewho was  at home earlier and wife saw him standing up against the wall unresponsive, staring and had a bowel movement.   He was able to share what his wife described as another similar episodes Two weeks ago, patient was leaning over to pick up something and says he may have strained to reach down to get the item. He says his head was "all the way down." When he sat up, he apparently became dizzy and had blurred vision. He then became confused and his wife told him that for 20 minutes he "reached for things that were not there and acted confused." This lasted for 20 minutes and he returned to normal by the time EMS arrived. The second event occurred prior to admission. Patient was sitting upright and did not have a positional change or  strain to reach for something. He had sudden onset of dizziness. Like the time two weeks before, his wife assisted him to another room but he was confused again. This time the patient did not "return to clear understanding" until he was in the ambulance. This was approximately 30-40 minutes after onset. When asked if this has happened before, patient recalled an event 10 years prior when he suddenly lost his words for a few minutes and an event 15 years prior when he had trouble understanding the words he was reading. However, no confusion spells of note. CT scan of the brain on admission was unremarkable and CT angiogram showed no large vessel stenosis or occlusion. EEG was obtained which showed no definite seizure activity. Transthoracic echo showed normal ejection fraction. LDL cholesterol was 55 mg percent. Hemoglobin A1c was 5.8. Patient was started on a trial of Depakote ER 500 mg daily. He states he tolerated well but he ran out of a week ago has not filled it yet. He complained of increased frequency of soft stools about 3 times a day he felt this may be related to Depakote. He has had no further recurrent episodes. Denies any prior history of significant head injury with loss of consciousness, childhood epilepsy or other seizures.   REVIEW OF SYSTEMS: Full 14 system review of systems performed and notable only for those listed, all others are neg:  Constitutional: neg  Cardiovascular: Right leg swelling Ear/Nose/Throat: neg  Skin: neg Eyes: neg Respiratory: neg Gastroitestinal: neg  Hematology/Lymphatic: neg  Endocrine: neg Musculoskeletal:neg Allergy/Immunology: neg Neurological: neg Psychiatric: neg Sleep : neg   ALLERGIES: No Known Allergies  HOME MEDICATIONS: Outpatient Medications Prior to Visit  Medication Sig Dispense Refill  . ALPRAZolam (XANAX) 1 MG tablet Take 1 mg by mouth at bedtime.     . ARTIFICIAL TEAR OP Place 1 drop into both eyes daily as needed. For dry  eyes    . aspirin 81 MG tablet Take 1 tablet (81 mg total) by mouth daily. Over the counter 30 tablet 3  . cetirizine (ZYRTEC) 10 MG tablet Take 10 mg by mouth daily.    . cholecalciferol (VITAMIN D) 1000 UNITS tablet Take 1,000 Units by mouth 2 (two) times daily.     . divalproex (DEPAKOTE ER) 500 MG 24 hr tablet Take 1 tablet (500 mg total) by mouth daily. 30 tablet 11  . IRON PO Take 1 tablet by mouth See admin instructions. Take couple times a week per patient. No specific days    . Multiple Vitamin (MULTIVITAMIN) tablet Take 1 tablet by mouth daily.    . Omega-3 Fatty Acids (FISH OIL) 1000 MG CAPS Take 1,000 mg by mouth daily.    . tamsulosin (FLOMAX) 0.4 MG CAPS capsule TAKE 1 CAPSULE (0.4 MG TOTAL) BY MOUTH DAILY AFTER SUPPER. 30 capsule 5  . traZODone (DESYREL) 50 MG tablet Take 25 mg by mouth at bedtime.      No facility-administered medications prior to visit.     PAST MEDICAL HISTORY: Past Medical History:  Diagnosis Date  . Colon polyps   . Diverticulosis   . Prostate cancer [facility]) 2015   radiation finished Nov 2015  . Seizures ([place]) 07/2016    PAST SURGICAL HISTORY: Past Surgical History:  Procedure Laterality Date  . colonscopy    . INGUINAL HERNIA REPAIR    . KNEE SURGERY    . PROSTATE BIOPSY      FAMILY HISTORY: Family History  Problem Relation Age of Onset  . Alcoholism Father   . Cancer Neg Hx   . Colon cancer Neg Hx     SOCIAL HISTORY: Social History   Social History  . Marital status: Married    Spouse name: N/A  . Number of children: N/A  . Years of education: N/A   Occupational History  . civil Chief Financial Officer    Social History Main Topics  . Smoking status: Never Smoker  . Smokeless tobacco: Never Used  . Alcohol use 4.2 oz/week    7 Glasses of wine per week     Comment: 4 oz nightly  . Drug use: No  . Sexual activity: Yes   Other Topics Concern  . Not on file   Social History Narrative   Lives with wife     PHYSICAL EXAM  Vitals:    12/05/16 1326  BP: 114/64  Pulse: (!) 53  Weight: 181 lb 9.6 oz (82.4 kg)   Body mass index is 25.69 kg/m.  Generalized: Well developed, in no acute distress  Head: normocephalic and atraumatic,. Oropharynx benign  Neck: Supple, no carotid bruits  Cardiac: Regular rate rhythm, no murmur  Musculoskeletal: No deformity   Neurological examination   Mentation: Alert oriented to time, place, history taking. Attention span and concentration appropriate. Recent and remote memory intact.  Follows all commands speech and language fluent.   Cranial nerve II-XII: Pupils were equal round reactive to light extraocular movements were full, visual field were full on confrontational test. Facial sensation and strength were normal. hearing  was intact to finger rubbing bilaterally. Uvula tongue midline. head turning and shoulder shrug were normal and symmetric.Tongue protrusion into cheek strength was normal. Motor: normal bulk and tone, full strength in the BUE, BLE, fine finger movements normal, no pronator drift.  Sensory: normal and symmetric to light touch, pinprick, and  Vibration, in the upper and lower extremities  Coordination: finger-nose-finger, heel-to-shin bilaterally, no dysmetria Reflexes: 1+ upper lower and symmetric, plantar responses were flexor bilaterally. Gait and Station: Rising up from seated position without assistance, normal stance,  moderate stride, good arm swing, smooth turning, able to perform tiptoe, and heel walking without difficulty. Tandem gait is steady  DIAGNOSTIC DATA (LABS, IMAGING, TESTING) - I reviewed patient records, labs, notes, testing and imaging myself where available.  Lab Results  Component Value Date   WBC 4.7 07/29/2016   HGB 12.1 (L) 07/29/2016   HCT 36.4 (L) 07/29/2016   MCV 96.8 07/29/2016   PLT 260 07/29/2016      Component Value Date/Time   NA 137 07/29/2016 0822   K 3.7 07/29/2016 0822   CL 105 07/29/2016 0822   CO2 27 07/29/2016 0822    GLUCOSE 116 (H) 07/29/2016 0822   BUN 11 07/29/2016 0822   CREATININE 0.92 07/29/2016 0822   CALCIUM 9.0 07/29/2016 0822   PROT 6.0 (L) 07/29/2016 0822   ALBUMIN 3.4 (L) 07/29/2016 0822   AST 24 07/29/2016 0822   ALT 18 07/29/2016 0822   ALKPHOS 53 07/29/2016 0822   BILITOT 0.6 07/29/2016 0822   GFRNONAA >60 07/29/2016 0822   GFRAA >60 07/29/2016 0822   Lab Results  Component Value Date   CHOL 114 07/13/2016   HDL 42 07/13/2016   LDLCALC 55 07/13/2016   TRIG 85 07/13/2016   CHOLHDL 2.7 07/13/2016   Lab Results  Component Value Date   HGBA1C 5.8 (H) 07/13/2016    Lab Results  Component Value Date   TSH 2.194 07/28/2016      ASSESSMENT AND PLAN 32 year Caucasian male with 2 episodes in a 3 week period of brief altered consciousness possible Compex partial seizures. Complicated migraine or TIA is less likely.He is now on Depakote without further events.The patient is a current patient of Dr. Leonie Man  who is out of the office today . This note is sent to the work in doctor.     PLAN: Depakote ER 500 mg daily. Repeat EEG study was normal He will return for follow-up in 6 months or call earlier for events Please remember, common seizure triggers are: Sleep deprivation, dehydration, overheating, stress, hypoglycemia or skipping meals, certain medications or excessive alcohol use, especially stopping alcohol abruptly if you have had heavy alcohol use before (aka alcohol withdrawal seizure). If you have a prolonged seizure over 2-5 minutes or back to back seizures, call or have someone call 911 or take you to the nearest emergency room. You cannot drive a car or operate any other machinery or vehicle within 6 months of a seizure. Take your medicine for seizure prevention regularly and do not skip doses or stop medication abruptly and tone are told to do so by your healthcare provider.  Avoid taking Wellbutrin, narcotic pain medications and tramadol, as they can lower seizure  threshold.  I spent 25 minutes in total face to face time with the patient more than 50% of which was spent counseling and coordination of care, reviewing test results reviewing medications and discussing and reviewing the diagnosis of seizure disorder and avoiding seizure triggers and  further treatment options. For recurrent seizure , Dennie Bible, [facility], [facility], APRN  [facility] [address], [place], [place] [address] [phone]

## 2016-12-05 NOTE — Patient Instructions (Signed)
Depakote ER 500 mg daily. Repeat EEG study was normal He will return for follow-up in 6 months or call earlier  Please remember, common seizure triggers are: Sleep deprivation, dehydration, overheating, stress, hypoglycemia or skipping meals, certain medications or excessive alcohol use, especially stopping alcohol abruptly if you have had heavy alcohol use before (aka alcohol withdrawal seizure). If you have a prolonged seizure over 2-5 minutes or back to back seizures, call or have someone call 911 or take you to the nearest emergency room. You cannot drive a car or operate any other machinery or vehicle within 6 months of a seizure. Take your medicine for seizure prevention regularly and do not skip doses or stop medication abruptly and tone are told to do so by your healthcare provider.  Avoid taking Wellbutrin, narcotic pain medications and tramadol, as they can lower seizure threshold.

## 2017-01-19 NOTE — Progress Notes (Signed)
Personally  participated in, made any corrections needed, and agree with history, physical, neuro exam,assessment and plan as stated.     Caius Silbernagel, MD [facility]     

## 2017-06-07 NOTE — Progress Notes (Signed)
[facility]  PATIENT: Kenneth Bowen DOB: Apr 09, 1931   REASON FOR VISIT: Follow-up for complex partial seizure disorder HISTORY FROM: Patient    HISTORY OF PRESENT ILLNESS UPDATE 1/20/2019[name], 84 year old male returns for follow-up with history of complex partial seizure disorder.  He is currently on Depakote 500 mg after having 2 events when he became unresponsive.  He denies any side effects to the Depakote.  No seizure activity since last seen.  He continues to exercise by walking on a treadmill.  He returns for reevaluation   UPDATE 07/25/2018CM. Kenneth Bowen, 84 year old male returns for follow-up with history of complex partial seizure disorder. He was placed on Depakote 500mg  after having 2 events where he was unresponsive staring had a bowel movement second event occurred when he became dizzy had blurred vision and he was confused  for about 20 minutes and then returned normal. Patient now recalls an event 10 years ago when he suddenly loss his words for a few minutes and 15 years ago he had trouble understanding the words that he was reading. He denies any side effects to the Depakote and he has not had further events. He returns for reevaluation    :09/05/16 [name] is a 54 year Caucasian male seen today for first office follow-up visit for in-hospital with admission in March 2018 for episode of brief loss of awareness. History is obtained from the patient, review of electronic medical records and have personally reviewed imaging for films. Kenneth Meador Basingeris an 84 y.o.malewho was  at home earlier and wife saw him standing up against the wall unresponsive, staring and had a bowel movement.   He was able to share what his wife described as another similar episodes Two weeks ago, patient was leaning over to pick up something and says he may have strained to reach down to get the item. He says his head was "all the way down." When he sat up,  he apparently became dizzy and had blurred vision. He then became confused and his wife told him that for 20 minutes he "reached for things that were not there and acted confused." This lasted for 20 minutes and he returned to normal by the time EMS arrived. The second event occurred prior to admission. Patient was sitting upright and did not have a positional change or strain to reach for something. He had sudden onset of dizziness. Like the time two weeks before, his wife assisted him to another room but he was confused again. This time the patient did not "return to clear understanding" until he was in the ambulance. This was approximately 30-40 minutes after onset. When asked if this has happened before, patient recalled an event 10 years prior when he suddenly lost his words for a few minutes and an event 15 years prior when he had trouble understanding the words he was reading. However, no confusion spells of note. CT scan of the brain on admission was unremarkable and CT angiogram showed no large vessel stenosis or occlusion. EEG was obtained which showed no definite seizure activity. Transthoracic echo showed normal ejection fraction. LDL cholesterol was 55 mg percent. Hemoglobin A1c was 5.8. Patient was started on a trial of Depakote ER 500 mg daily. He states he tolerated well but he ran out of a week ago has not filled it yet. He complained of increased frequency of soft stools about 3 times a day he felt this may be related to Depakote. He has had no  further recurrent episodes. Denies any prior history of significant head injury with loss of consciousness, childhood epilepsy or other seizures.   REVIEW OF SYSTEMS: Full 14 system review of systems performed and notable only for those listed, all others are neg:  Constitutional: neg  Cardiovascular: Right leg swelling Ear/Nose/Throat: neg  Skin: neg Eyes: neg Respiratory: neg Gastroitestinal: neg  Hematology/Lymphatic: neg  Endocrine:  neg Musculoskeletal:neg Allergy/Immunology: neg Neurological: History of seizure disorder Psychiatric: neg Sleep : neg   ALLERGIES: No Known Allergies  HOME MEDICATIONS: Outpatient Medications Prior to Visit  Medication Sig Dispense Refill  . ALPRAZolam (XANAX) 1 MG tablet Take 1 mg by mouth at bedtime.     . ARTIFICIAL TEAR OP Place 1 drop into both eyes daily as needed. For dry eyes    . aspirin 81 MG tablet Take 1 tablet (81 mg total) by mouth daily. Over the counter 30 tablet 3  . cholecalciferol (VITAMIN D) 1000 UNITS tablet Take 1,000 Units by mouth 2 (two) times daily.     . divalproex (DEPAKOTE ER) 500 MG 24 hr tablet Take 1 tablet (500 mg total) by mouth daily. 30 tablet 11  . IRON PO Take 1 tablet by mouth See admin instructions. Take couple times a week per patient. No specific days    . Multiple Vitamin (MULTIVITAMIN) tablet Take 1 tablet by mouth daily.    . Omega-3 Fatty Acids (FISH OIL) 1000 MG CAPS Take 1,000 mg by mouth daily.    . tamsulosin (FLOMAX) 0.4 MG CAPS capsule TAKE 1 CAPSULE (0.4 MG TOTAL) BY MOUTH DAILY AFTER SUPPER. 30 capsule 5  . traZODone (DESYREL) 50 MG tablet Take 25 mg by mouth at bedtime.     . cetirizine (ZYRTEC) 10 MG tablet Take 10 mg by mouth daily.     No facility-administered medications prior to visit.     PAST MEDICAL HISTORY: Past Medical History:  Diagnosis Date  . Colon polyps   . Diverticulosis   . Prostate cancer [facility]) 2015   radiation finished Nov 2015  . Seizures ([place]) 07/2016    PAST SURGICAL HISTORY: Past Surgical History:  Procedure Laterality Date  . colonscopy    . INGUINAL HERNIA REPAIR    . KNEE SURGERY    . PROSTATE BIOPSY      FAMILY HISTORY: Family History  Problem Relation Age of Onset  . Alcoholism Father   . Cancer Neg Hx   . Colon cancer Neg Hx     SOCIAL HISTORY: Social History   Socioeconomic History  . Marital status: Married    Spouse name: Not on file  . Number of children: Not on file   . Years of education: Not on file  . Highest education level: Not on file  Social Needs  . Financial resource strain: Not on file  . Food insecurity - worry: Not on file  . Food insecurity - inability: Not on file  . Transportation needs - medical: Not on file  . Transportation needs - non-medical: Not on file  Occupational History  . Occupation: civil Chief Financial Officer  Tobacco Use  . Smoking status: Never Smoker  . Smokeless tobacco: Never Used  Substance and Sexual Activity  . Alcohol use: Yes    Alcohol/week: 4.2 oz    Types: 7 Glasses of wine per week    Comment: 4 oz nightly  . Drug use: No  . Sexual activity: Yes  Other Topics Concern  . Not on file  Social History Narrative  Lives with wife     PHYSICAL EXAM  Vitals:   06/10/17 1318  BP: 112/65  Pulse: (!) 54  Weight: 180 lb 12.8 oz (82 kg)   Body mass index is 25.58 kg/m.  Generalized: Well developed, in no acute distress  Head: normocephalic and atraumatic,. Oropharynx benign  Neck: Supple,  Musculoskeletal: No deformity   Neurological examination   Mentation: Alert oriented to time, place, history taking. Attention span and concentration appropriate. Recent and remote memory intact.  Follows all commands speech and language fluent.   Cranial nerve II-XII: Pupils were equal round reactive to light extraocular movements were full, visual field were full on confrontational test. Facial sensation and strength were normal. hearing was intact to finger rubbing bilaterally. Uvula tongue midline. head turning and shoulder shrug were normal and symmetric.Tongue protrusion into cheek strength was normal. Motor: normal bulk and tone, full strength in the BUE, BLE, fine finger movements normal, no pronator drift.  Sensory: normal and symmetric to light touch, pinprick, and  Vibration, in the upper and lower extremities  Coordination: finger-nose-finger, heel-to-shin bilaterally, no dysmetria Reflexes: 1+ upper lower and  symmetric, plantar responses were flexor bilaterally. Gait and Station: Rising up from seated position without assistance, normal stance,  moderate stride, good arm swing, smooth turning, able to perform tiptoe, and heel walking without difficulty. Tandem gait is mildly unsteady.  No assistive device  DIAGNOSTIC DATA (LABS, IMAGING, TESTING) - I reviewed patient records, labs, notes, testing and imaging myself where available.  Lab Results  Component Value Date   WBC 4.7 07/29/2016   HGB 12.1 (L) 07/29/2016   HCT 36.4 (L) 07/29/2016   MCV 96.8 07/29/2016   PLT 260 07/29/2016      Component Value Date/Time   NA 137 07/29/2016 0822   K 3.7 07/29/2016 0822   CL 105 07/29/2016 0822   CO2 27 07/29/2016 0822   GLUCOSE 116 (H) 07/29/2016 0822   BUN 11 07/29/2016 0822   CREATININE 0.92 07/29/2016 0822   CALCIUM 9.0 07/29/2016 0822   PROT 6.0 (L) 07/29/2016 0822   ALBUMIN 3.4 (L) 07/29/2016 0822   AST 24 07/29/2016 0822   ALT 18 07/29/2016 0822   ALKPHOS 53 07/29/2016 0822   BILITOT 0.6 07/29/2016 0822   GFRNONAA >60 07/29/2016 0822   GFRAA >60 07/29/2016 0822   Lab Results  Component Value Date   CHOL 114 07/13/2016   HDL 42 07/13/2016   LDLCALC 55 07/13/2016   TRIG 85 07/13/2016   CHOLHDL 2.7 07/13/2016   Lab Results  Component Value Date   HGBA1C 5.8 (H) 07/13/2016    Lab Results  Component Value Date   TSH 2.194 07/28/2016      ASSESSMENT AND PLAN 62 year Caucasian male with 2 episodes in a 3 week period of brief altered consciousness possible Compex partial seizures. Complicated migraine or TIA is less likely.He is now on Depakote without further events.Marland Kitchen     PLAN: Depakote ER 500 mg daily to continue . Will check labs today to monitor adverse effects of Depakote Call for seizure events  He will return for follow-up in 8 months or call earlier for events Please remember, common seizure triggers are: Sleep deprivation, dehydration, overheating, stress,  hypoglycemia or skipping meals, certain medications or excessive alcohol use they can lower seizure threshold.  Dennie Bible, [facility], APRN  [facility] [address], [place], [place] [address] [address] [phone]

## 2017-06-10 ENCOUNTER — Ambulatory Visit (INDEPENDENT_AMBULATORY_CARE_PROVIDER_SITE_OTHER): Payer: Medicare Other | Admitting: Nurse Practitioner

## 2017-06-10 ENCOUNTER — Encounter: Payer: Self-pay | Admitting: Nurse Practitioner

## 2017-06-10 VITALS — BP 112/65 | HR 54 | Wt 180.8 lb

## 2017-06-10 DIAGNOSIS — G40209 Localization-related (focal) (partial) symptomatic epilepsy and epileptic syndromes with complex partial seizures, not intractable, without status epilepticus: Secondary | ICD-10-CM

## 2017-06-10 DIAGNOSIS — Z5181 Encounter for therapeutic drug level monitoring: Secondary | ICD-10-CM

## 2017-06-10 NOTE — Patient Instructions (Signed)
Depakote ER 500 mg daily. Will check labs today Call for seizure events  He will return for follow-up in 8 months or call earlier for events

## 2017-06-11 LAB — COMPREHENSIVE METABOLIC PANEL
ALT: 14 IU/L (ref 0–44)
AST: 19 IU/L (ref 0–40)
Albumin/Globulin Ratio: 1.5 (ref 1.2–2.2)
Albumin: 4 g/dL (ref 3.5–4.7)
Alkaline Phosphatase: 54 IU/L (ref 39–117)
BUN/Creatinine Ratio: 16 (ref 10–24)
BUN: 14 mg/dL (ref 8–27)
Bilirubin Total: 0.3 mg/dL (ref 0.0–1.2)
CO2: 25 mmol/L (ref 20–29)
CREATININE: 0.87 mg/dL (ref 0.76–1.27)
Calcium: 9 mg/dL (ref 8.6–10.2)
Chloride: 100 mmol/L (ref 96–106)
GFR calc Af Amer: 92 mL/min/{1.73_m2} (ref >59)
GFR calc non Af Amer: 79 mL/min/{1.73_m2} (ref >59)
Globulin, Total: 2.6 g/dL (ref 1.5–4.5)
Glucose: 82 mg/dL (ref 65–99)
Potassium: 4.8 mmol/L (ref 3.5–5.2)
Sodium: 139 mmol/L (ref 134–144)
Total Protein: 6.6 g/dL (ref 6.0–8.5)

## 2017-06-11 LAB — CBC WITH DIFFERENTIAL/PLATELET
BASOS: 1 %
Basophils Absolute: 0 10*3/uL (ref 0.0–0.2)
EOS (ABSOLUTE): 0.2 10*3/uL (ref 0.0–0.4)
EOS: 4 %
HEMATOCRIT: 37.8 % (ref 37.5–51.0)
Hemoglobin: 12.7 g/dL — ABNORMAL LOW (ref 13.0–17.7)
IMMATURE GRANS (ABS): 0 10*3/uL (ref 0.0–0.1)
Immature Granulocytes: 1 %
LYMPHS: 29 %
Lymphocytes Absolute: 1.7 10*3/uL (ref 0.7–3.1)
MCH: 33.3 pg — ABNORMAL HIGH (ref 26.6–33.0)
MCHC: 33.6 g/dL (ref 31.5–35.7)
MCV: 99 fL — AB (ref 79–97)
Monocytes Absolute: 0.8 10*3/uL (ref 0.1–0.9)
Monocytes: 13 %
NEUTROS PCT: 52 %
Neutrophils Absolute: 3.2 10*3/uL (ref 1.4–7.0)
Platelets: 244 10*3/uL (ref 150–379)
RBC: 3.81 x10E6/uL — ABNORMAL LOW (ref 4.14–5.80)
RDW: 13.1 % (ref 12.3–15.4)
WBC: 6 10*3/uL (ref 3.4–10.8)

## 2017-06-11 LAB — LIPASE: Lipase: 26 U/L (ref 13–78)

## 2017-06-12 ENCOUNTER — Telehealth: Payer: Self-pay | Admitting: *Deleted

## 2017-06-12 NOTE — Telephone Encounter (Signed)
Spoke with patient and informed him his labs are stable. He verbalized understanding, appreciation. 

## 2017-06-25 ENCOUNTER — Ambulatory Visit
Admission: RE | Admit: 2017-06-25 | Discharge: 2017-06-25 | Disposition: A | Discharge status: Home / Self Care | Payer: Medicare Other | Source: Ambulatory Visit | Attending: Internal Medicine | Admitting: Internal Medicine

## 2017-06-25 ENCOUNTER — Other Ambulatory Visit: Payer: Self-pay | Admitting: Internal Medicine

## 2017-06-25 DIAGNOSIS — W19XXXA Unspecified fall, initial encounter: Secondary | ICD-10-CM

## 2017-08-10 ENCOUNTER — Other Ambulatory Visit: Payer: Self-pay | Admitting: Neurology

## 2017-08-12 ENCOUNTER — Other Ambulatory Visit: Payer: Self-pay

## 2017-08-12 MED ORDER — DIVALPROEX SODIUM ER 500 MG PO TB24: 500.0000 mg | ORAL_TABLET | Freq: Every day | ORAL | 3 refills | Status: DC

## 2018-02-05 NOTE — Progress Notes (Signed)
[facility]  PATIENT: Kenneth Bowen DOB: 1930/07/21   REASON FOR VISIT: Follow-up for complex partial seizure disorder HISTORY FROM: Patient    HISTORY OF PRESENT ILLNESS UPDATE 9/30/2019[name], 84 year old male returns for follow-up with history of complex partial seizure disorder.  He has just moved to [facility] in the last month and is made a good transition so far.  He is currently on Depakote 500 after having 2 events when he became unresponsive.  His last event was in March 2018.  He denies any side effects to Depakote.  He has joined an exercise class at his facility.  He denies any falls.  He ambulates with a single-point cane.  No interval medical issues.  He returns for reevaluation    UPDATE 1/20/2019[name], 84 year old male returns for follow-up with history of complex partial seizure disorder.  He is currently on Depakote 500 mg after having 2 events when he became unresponsive.  He denies any side effects to the Depakote.  No seizure activity since last seen.  He continues to exercise by walking on a treadmill.  He returns for reevaluation   UPDATE 07/25/2018CM. Kenneth Bowen, 84 year old male returns for follow-up with history of complex partial seizure disorder. He was placed on Depakote 500mg  after having 2 events where he was unresponsive staring had a bowel movement second event occurred when he became dizzy had blurred vision and he was confused  for about 20 minutes and then returned normal. Patient now recalls an event 10 years ago when he suddenly loss his words for a few minutes and 15 years ago he had trouble understanding the words that he was reading. He denies any side effects to the Depakote and he has not had further events. He returns for reevaluation    :09/05/16 [name] is a 89 year Caucasian male seen today for first office follow-up visit for in-hospital with admission in March 2018 for  episode of brief loss of awareness. History is obtained from the patient, review of electronic medical records and have personally reviewed imaging for films. Kenneth Foots Basingeris an 84 y.o.malewho was  at home earlier and wife saw him standing up against the wall unresponsive, staring and had a bowel movement.   He was able to share what his wife described as another similar episodes Two weeks ago, patient was leaning over to pick up something and says he may have strained to reach down to get the item. He says his head was "all the way down." When he sat up, he apparently became dizzy and had blurred vision. He then became confused and his wife told him that for 20 minutes he "reached for things that were not there and acted confused." This lasted for 20 minutes and he returned to normal by the time EMS arrived. The second event occurred prior to admission. Patient was sitting upright and did not have a positional change or strain to reach for something. He had sudden onset of dizziness. Like the time two weeks before, his wife assisted him to another room but he was confused again. This time the patient did not "return to clear understanding" until he was in the ambulance. This was approximately 30-40 minutes after onset. When asked if this has happened before, patient recalled an event 10 years prior when he suddenly lost his words for a few minutes and an event 15 years prior when he had trouble understanding the words he was reading. However,  no confusion spells of note. CT scan of the brain on admission was unremarkable and CT angiogram showed no large vessel stenosis or occlusion. EEG was obtained which showed no definite seizure activity. Transthoracic echo showed normal ejection fraction. LDL cholesterol was 55 mg percent. Hemoglobin A1c was 5.8. Patient was started on a trial of Depakote ER 500 mg daily. He states he tolerated well but he ran out of a week ago has not filled it yet. He  complained of increased frequency of soft stools about 3 times a day he felt this may be related to Depakote. He has had no further recurrent episodes. Denies any prior history of significant head injury with loss of consciousness, childhood epilepsy or other seizures.   REVIEW OF SYSTEMS: Full 14 system review of systems performed and notable only for those listed, all others are neg:  Constitutional: neg  Cardiovascular: Right leg swelling Ear/Nose/Throat: neg  Skin: neg Eyes: neg Respiratory: neg Gastroitestinal: neg  Hematology/Lymphatic: neg  Endocrine: neg Musculoskeletal:neg Allergy/Immunology: neg Neurological: History of seizure disorder Psychiatric: neg Sleep : neg   ALLERGIES: No Known Allergies  HOME MEDICATIONS: Outpatient Medications Prior to Visit  Medication Sig Dispense Refill  . ALPRAZolam (XANAX) 1 MG tablet Take 1 mg by mouth at bedtime.     . ARTIFICIAL TEAR OP Place 1 drop into both eyes daily as needed. For dry eyes    . aspirin 81 MG tablet Take 1 tablet (81 mg total) by mouth daily. Over the counter 30 tablet 3  . cholecalciferol (VITAMIN D) 1000 UNITS tablet Take 1,000 Units by mouth 2 (two) times daily.     . divalproex (DEPAKOTE ER) 500 MG 24 hr tablet Take 1 tablet (500 mg total) by mouth daily. 90 tablet 3  . IRON PO Take 1 tablet by mouth See admin instructions. Take couple times a week per patient. No specific days    . Multiple Vitamin (MULTIVITAMIN) tablet Take 1 tablet by mouth daily.    . Omega-3 Fatty Acids (FISH OIL) 1000 MG CAPS Take 1,000 mg by mouth daily.    . tamsulosin (FLOMAX) 0.4 MG CAPS capsule TAKE 1 CAPSULE (0.4 MG TOTAL) BY MOUTH DAILY AFTER SUPPER. 30 capsule 5  . traZODone (DESYREL) 50 MG tablet Take 25 mg by mouth at bedtime.     . cetirizine (ZYRTEC) 10 MG tablet Take 10 mg by mouth daily.     No facility-administered medications prior to visit.     PAST MEDICAL HISTORY: Past Medical History:  Diagnosis Date  . Colon  polyps   . Diverticulosis   . Prostate cancer [facility]) 2015   radiation finished Nov 2015  . Seizures ([place]) 07/2016    PAST SURGICAL HISTORY: Past Surgical History:  Procedure Laterality Date  . colonscopy    . INGUINAL HERNIA REPAIR    . KNEE SURGERY    . PROSTATE BIOPSY      FAMILY HISTORY: Family History  Problem Relation Age of Onset  . Alcoholism Father   . Cancer Neg Hx   . Colon cancer Neg Hx     SOCIAL HISTORY: Social History   Socioeconomic History  . Marital status: Married    Spouse name: Not on file  . Number of children: Not on file  . Years of education: Not on file  . Highest education level: Not on file  Occupational History  . Occupation: civil Glass blower/designer  . Financial resource strain: Not on file  . Food insecurity:  Worry: Not on file    Inability: Not on file  . Transportation needs:    Medical: Not on file    Non-medical: Not on file  Tobacco Use  . Smoking status: Never Smoker  . Smokeless tobacco: Never Used  Substance and Sexual Activity  . Alcohol use: Yes    Alcohol/week: 7.0 standard drinks    Types: 7 Glasses of wine per week    Comment: 4 oz nightly  . Drug use: No  . Sexual activity: Yes  Lifestyle  . Physical activity:    Days per week: Not on file    Minutes per session: Not on file  . Stress: Not on file  Relationships  . Social connections:    Talks on phone: Not on file    Gets together: Not on file    Attends religious service: Not on file    Active member of club or organization: Not on file    Attends meetings of clubs or organizations: Not on file    Relationship status: Not on file  . Intimate partner violence:    Fear of current or ex partner: Not on file    Emotionally abused: Not on file    Physically abused: Not on file    Forced sexual activity: Not on file  Other Topics Concern  . Not on file  Social History Narrative   Lives with wife at [facility] in [place].  Uses cane.        PHYSICAL EXAM  Vitals:   02/10/18 1317 02/10/18 1324  BP: (!) 93/55 105/60  Pulse: 62 61  Weight: 175 lb 12.8 oz (79.7 kg)   Height: 5' 10.5" (1.791 m)    Body mass index is 24.87 kg/m.  Generalized: Well developed, in no acute distress  Head: normocephalic and atraumatic,. Oropharynx benign  Neck: Supple,  Musculoskeletal: No deformity   Neurological examination   Mentation: Alert oriented to time, place, history taking. Attention span and concentration appropriate. Recent and remote memory intact.  Follows all commands speech and language fluent.   Cranial nerve II-XII: Pupils were equal round reactive to light extraocular movements were full, visual field were full on confrontational test. Facial sensation and strength were normal. hearing was intact to finger rubbing bilaterally. Uvula tongue midline. head turning and shoulder shrug were normal and symmetric.Tongue protrusion into cheek strength was normal. Motor: normal bulk and tone, full strength in the BUE, BLE,   Sensory: normal and symmetric to light touch, in the upper and lower extremities  Coordination: finger-nose-finger, heel-to-shin bilaterally, no dysmetria Reflexes: 1+ upper lower and symmetric, plantar responses were flexor bilaterally. Gait and Station: Rising up from seated position without assistance, normal stance,  moderate stride, good arm swing, smooth turning, able to perform tiptoe, and heel walking without difficulty. Tandem gait is mildly unsteady.  Uses single-point cane to ambulate  DIAGNOSTIC DATA (LABS, IMAGING, TESTING) - I reviewed patient records, labs, notes, testing and imaging myself where available.  Lab Results  Component Value Date   WBC 6.0 06/10/2017   HGB 12.7 (L) 06/10/2017   HCT 37.8 06/10/2017   MCV 99 (H) 06/10/2017   PLT 244 06/10/2017      Component Value Date/Time   NA 139 06/10/2017 1352   K 4.8 06/10/2017 1352   CL 100 06/10/2017 1352   CO2 25 06/10/2017 1352     GLUCOSE 82 06/10/2017 1352   GLUCOSE 116 (H) 07/29/2016 0822   BUN 14 06/10/2017 1352  CREATININE 0.87 06/10/2017 1352   CALCIUM 9.0 06/10/2017 1352   PROT 6.6 06/10/2017 1352   ALBUMIN 4.0 06/10/2017 1352   AST 19 06/10/2017 1352   ALT 14 06/10/2017 1352   ALKPHOS 54 06/10/2017 1352   BILITOT 0.3 06/10/2017 1352   GFRNONAA 79 06/10/2017 1352   GFRAA 92 06/10/2017 1352   Lab Results  Component Value Date   CHOL 114 07/13/2016   HDL 42 07/13/2016   LDLCALC 55 07/13/2016   TRIG 85 07/13/2016   CHOLHDL 2.7 07/13/2016   Lab Results  Component Value Date   HGBA1C 5.8 (H) 07/13/2016    Lab Results  Component Value Date   TSH 2.194 07/28/2016      ASSESSMENT AND PLAN 37 year Caucasian male with 2 episodes in a 3 week period of brief altered consciousness possible Compex partial seizures. Complicated migraine or TIA is less likely.He is now on Depakote without further events. The patient is a current patient of Dr.Sethi   who is out of the office today . This note is sent to the work in doctor.       PLAN: Depakote ER 500 mg daily to continue  Does not need refills. Will check labs today to monitor adverse effects of Depakote, CBC and CMP Call for seizure events  He will return for follow-up in 1 year Kenneth Bible, [facility], [facility], [facility] [address] [place], [place] [address] [phone]

## 2018-02-10 ENCOUNTER — Encounter: Payer: Self-pay | Admitting: Nurse Practitioner

## 2018-02-10 ENCOUNTER — Ambulatory Visit (INDEPENDENT_AMBULATORY_CARE_PROVIDER_SITE_OTHER): Payer: Medicare Other | Admitting: Nurse Practitioner

## 2018-02-10 VITALS — BP 105/60 | HR 61 | Ht 70.5 in | Wt 175.8 lb

## 2018-02-10 DIAGNOSIS — Z5181 Encounter for therapeutic drug level monitoring: Secondary | ICD-10-CM | POA: Diagnosis not present

## 2018-02-10 DIAGNOSIS — G40209 Localization-related (focal) (partial) symptomatic epilepsy and epileptic syndromes with complex partial seizures, not intractable, without status epilepticus: Secondary | ICD-10-CM | POA: Diagnosis not present

## 2018-02-10 NOTE — Patient Instructions (Signed)
Depakote ER 500 mg daily to continue  Does not need refills. Will check labs today to monitor adverse effects of Depakote, CBC and CMP Call for seizure events  He will return for follow-up in 1 year

## 2018-02-10 NOTE — Progress Notes (Signed)
I have read the note, and I agree with the clinical assessment and plan.  Atilla Zollner K Lorenzo Arscott   

## 2018-02-11 ENCOUNTER — Telehealth: Payer: Self-pay | Admitting: *Deleted

## 2018-02-11 LAB — CBC WITH DIFFERENTIAL/PLATELET
BASOS ABS: 0 10*3/uL (ref 0.0–0.2)
Basos: 0 %
EOS (ABSOLUTE): 0.1 10*3/uL (ref 0.0–0.4)
Eos: 1 %
HEMOGLOBIN: 12.2 g/dL — AB (ref 13.0–17.7)
Hematocrit: 37.1 % — ABNORMAL LOW (ref 37.5–51.0)
IMMATURE GRANS (ABS): 0 10*3/uL (ref 0.0–0.1)
Immature Granulocytes: 1 %
LYMPHS: 15 %
Lymphocytes Absolute: 1.3 10*3/uL (ref 0.7–3.1)
MCH: 33.6 pg — AB (ref 26.6–33.0)
MCHC: 32.9 g/dL (ref 31.5–35.7)
MCV: 102 fL — ABNORMAL HIGH (ref 79–97)
MONOCYTES: 7 %
Monocytes Absolute: 0.6 10*3/uL (ref 0.1–0.9)
NEUTROS ABS: 6.5 10*3/uL (ref 1.4–7.0)
Neutrophils: 76 %
PLATELETS: 267 10*3/uL (ref 150–450)
RBC: 3.63 x10E6/uL — AB (ref 4.14–5.80)
RDW: 13.1 % (ref 12.3–15.4)
WBC: 8.6 10*3/uL (ref 3.4–10.8)

## 2018-02-11 LAB — COMPREHENSIVE METABOLIC PANEL
A/G RATIO: 1.7 (ref 1.2–2.2)
ALK PHOS: 67 IU/L (ref 39–117)
ALT: 13 IU/L (ref 0–44)
AST: 16 IU/L (ref 0–40)
Albumin: 4 g/dL (ref 3.5–4.7)
BILIRUBIN TOTAL: 0.2 mg/dL (ref 0.0–1.2)
BUN/Creatinine Ratio: 21 (ref 10–24)
BUN: 15 mg/dL (ref 8–27)
CHLORIDE: 98 mmol/L (ref 96–106)
CO2: 23 mmol/L (ref 20–29)
Calcium: 9.1 mg/dL (ref 8.6–10.2)
Creatinine, Ser: 0.72 mg/dL — ABNORMAL LOW (ref 0.76–1.27)
GFR calc Af Amer: 98 mL/min/{1.73_m2} (ref >59)
GFR calc non Af Amer: 85 mL/min/{1.73_m2} (ref >59)
GLUCOSE: 110 mg/dL — AB (ref 65–99)
Globulin, Total: 2.4 g/dL (ref 1.5–4.5)
POTASSIUM: 4.6 mmol/L (ref 3.5–5.2)
Sodium: 138 mmol/L (ref 134–144)
Total Protein: 6.4 g/dL (ref 6.0–8.5)

## 2018-02-11 NOTE — Telephone Encounter (Signed)
Spoke with patient and informed him that his labs are stable. He asked if he would see results on my chart; advised him he will. He verbalized understanding, appreciation.

## 2018-03-07 ENCOUNTER — Other Ambulatory Visit: Payer: Self-pay | Admitting: Internal Medicine

## 2018-03-07 ENCOUNTER — Ambulatory Visit
Admission: RE | Admit: 2018-03-07 | Discharge: 2018-03-07 | Disposition: A | Discharge status: Home / Self Care | Payer: Medicare Other | Source: Ambulatory Visit | Attending: Internal Medicine | Admitting: Internal Medicine

## 2018-03-07 DIAGNOSIS — R52 Pain, unspecified: Secondary | ICD-10-CM

## 2018-03-28 ENCOUNTER — Other Ambulatory Visit: Payer: Self-pay | Admitting: Internal Medicine

## 2018-03-28 DIAGNOSIS — R079 Chest pain, unspecified: Secondary | ICD-10-CM

## 2018-03-28 DIAGNOSIS — R1084 Generalized abdominal pain: Secondary | ICD-10-CM

## 2018-04-02 ENCOUNTER — Ambulatory Visit
Admission: RE | Admit: 2018-04-02 | Discharge: 2018-04-02 | Disposition: A | Discharge status: Home / Self Care | Payer: Medicare Other | Source: Ambulatory Visit | Attending: Internal Medicine | Admitting: Internal Medicine

## 2018-04-02 DIAGNOSIS — R1084 Generalized abdominal pain: Secondary | ICD-10-CM

## 2018-04-02 DIAGNOSIS — R079 Chest pain, unspecified: Secondary | ICD-10-CM

## 2018-04-02 MED ORDER — IOPAMIDOL (ISOVUE-300) INJECTION 61%
100.0000 mL | Freq: Once | INTRAVENOUS | Status: AC | PRN
  Administered 2018-04-02: 100 mL via INTRAVENOUS

## 2018-04-16 ENCOUNTER — Telehealth: Payer: Self-pay | Admitting: *Deleted

## 2018-04-16 NOTE — Telephone Encounter (Signed)
Spoke to [place] at Dr. Rolly Salter office [phone].   She states that Dr. Nyoka Cowden wanted to have pt seen for gait instability.  He had been in to see them for R chest wall pain and had CT chest.  Noted gait issue, and said was a neuro problem.  CM/NP has seen pt for seizures only.  This would be a new problem. Dr. Leonie Man is his MD.   I asked her to fax last note to [place] at [phone].  Call mobile, then home.

## 2018-04-28 ENCOUNTER — Encounter: Payer: Self-pay | Admitting: Neurology

## 2018-04-28 ENCOUNTER — Telehealth: Payer: Self-pay | Admitting: Neurology

## 2018-04-28 ENCOUNTER — Other Ambulatory Visit: Payer: Self-pay

## 2018-04-28 ENCOUNTER — Ambulatory Visit (INDEPENDENT_AMBULATORY_CARE_PROVIDER_SITE_OTHER): Payer: Medicare Other | Admitting: Neurology

## 2018-04-28 VITALS — BP 115/62 | HR 52 | Wt 181.0 lb

## 2018-04-28 DIAGNOSIS — G3281 Cerebellar ataxia in diseases classified elsewhere: Secondary | ICD-10-CM

## 2018-04-28 DIAGNOSIS — G2581 Restless legs syndrome: Secondary | ICD-10-CM | POA: Diagnosis not present

## 2018-04-28 DIAGNOSIS — R269 Unspecified abnormalities of gait and mobility: Secondary | ICD-10-CM

## 2018-04-28 MED ORDER — TOPIRAMATE 25 MG PO TABS: 25.0000 mg | ORAL_TABLET | Freq: Every evening | ORAL | 3 refills | Status: DC | PRN

## 2018-04-28 NOTE — Telephone Encounter (Signed)
Rx fax to pharmacy at [phone]. Rx receive.

## 2018-04-28 NOTE — Progress Notes (Signed)
[facility] [address] [place]. [place] [address] [phone]       OFFICE FOLLOW-UP NOTE  [name] Date of Birth:  1930-05-22 Medical Record Number:  [id number]   HPI: Initial visit 02/10/2018 Mr Borawski is a 84 year Caucasian male seen today for first office follow-up visit for in-hospital with admission in March 2018 for episode of brief loss of awareness. History is obtained from the patient, review of electronic medical records and have personally reviewed imaging for films. Kenneth Elzey Basingeris an 84 y.o.malewho was  at home earlier and wife saw him standing up against the wall unresponsive, staring and had a bowel movement.    He was able to share what his wife described as another similar episodes  Two weeks ago, patient was leaning over to pick up something and says he may have strained to reach down to get the item.  He says his head was "all the way down."  When he sat up, he apparently became dizzy and had blurred vision.  He then became confused and his wife told him that for 20 minutes he "reached for things that were not there and acted confused."  This lasted for 20 minutes and he returned to normal by the time EMS arrived.  The second event occurred prior to admission.  Patient was sitting upright and did not have a positional change or strain to reach for something.  He had sudden onset of dizziness.  Like the time two weeks before, his wife assisted him to another room but he was confused again.  This time the patient did not "return to clear understanding" until he was in the ambulance.  This was approximately 30-40 minutes after onset.  When asked if this has happened before, patient recalled an event 82 years prior when he suddenly lost his words for a few minutes and an event 82 years prior when he had trouble understanding the words he was reading.  However, no confusion spells of note. CT scan of the brain on admission was unremarkable and CT  angiogram showed no large vessel stenosis or occlusion. EEG was obtained which showed no definite seizure activity. Transthoracic echo showed normal ejection fraction. LDL cholesterol was 55 mg percent. Hemoglobin A1c was 5.8. Patient was started on a trial of Depakote ER 500 mg daily. He states he tolerated well but he ran out of a week ago has not filled it yet. He complained of increased frequency of soft stools about 3 times a day he felt this may be related to Depakote. He has had no further recurrent episodes. Denies any prior history of significant head injury with loss of consciousness, childhood epilepsy or other seizures. Update 04/28/2018 : He returns for follow-up after last visit 82-1/2 months ago.  He had unexplained fall backwards when he was getting up quickly.  He saw his primary physician Dr. Nyoka Cowden who recommended he see me for further evaluation.  He states that he has had several such falls following his initial fall in 2017 when he fell and sustained compression fracture of 2 vertebrae in the lumbar spine.  Since then he is learned to get up slowly but most of his falls have been backwards.  He denies any passing out or severe pain leading to the fall.  Patient was seen last week in our office in September 2019 at that time he had comprehensive metabolic panel as well as CBC drawn which were unremarkable.  He remains on Depakote  ER 500 daily and has had no further episodes of seizure-like events.  He is been bothered by swelling in his feet and Dr. Nyoka Cowden feels this may be related to Depakote.  He has been started on Lasix 20 mg but so far has not shown significant improvement.  He denies any weight gain, tiredness, tremors or dizziness.  Patient denies any leg pain, tingling and numbness.  He does have a feeling of restlessness particularly at night and had trouble sleeping but since he has been taking Xanax 1 mg at night as well as trazodone 50 mg the symptoms are under control.  He has not  tried other medications for restless legs like Topamax, gabapentin, Requip or Lyrica.  He denies any tremors in his hands, drooling of saliva, ,bradykinesia, festination no stooped posture.  He still has chronic back pain and his walking is affected by that and he has to favor his back while getting up and sitting down.  He denies radicular pain or numbness.  ROS:   14 system review of systems is positive for  impotence only all other systems negative  PMH:  Past Medical History:  Diagnosis Date  . Colon polyps   . Diverticulosis   . Prostate cancer [facility]) 2015   radiation finished Nov 2015  . Seizures ([place]) 07/2016    Social History:  Social History   Socioeconomic History  . Marital status: Married    Spouse name: Not on file  . Number of children: Not on file  . Years of education: Not on file  . Highest education level: Not on file  Occupational History  . Occupation: civil Glass blower/designer  . Financial resource strain: Not on file  . Food insecurity:    Worry: Not on file    Inability: Not on file  . Transportation needs:    Medical: Not on file    Non-medical: Not on file  Tobacco Use  . Smoking status: Never Smoker  . Smokeless tobacco: Never Used  Substance and Sexual Activity  . Alcohol use: Yes    Alcohol/week: 7.0 standard drinks    Types: 7 Glasses of wine per week    Comment: 4 oz nightly  . Drug use: No  . Sexual activity: Yes  Lifestyle  . Physical activity:    Days per week: Not on file    Minutes per session: Not on file  . Stress: Not on file  Relationships  . Social connections:    Talks on phone: Not on file    Gets together: Not on file    Attends religious service: Not on file    Active member of club or organization: Not on file    Attends meetings of clubs or organizations: Not on file    Relationship status: Not on file  . Intimate partner violence:    Fear of current or ex partner: Not on file    Emotionally abused: Not on file     Physically abused: Not on file    Forced sexual activity: Not on file  Other Topics Concern  . Not on file  Social History Narrative   Lives with wife at [facility] in [place].  Uses cane.      Medications:   Current Outpatient Medications on File Prior to Visit  Medication Sig Dispense Refill  . ALPRAZolam (XANAX) 1 MG tablet Take 0.5 mg by mouth at bedtime.    . ARTIFICIAL TEAR OP Place 1 drop into both  eyes daily as needed. For dry eyes    . aspirin 81 MG tablet Take 1 tablet (81 mg total) by mouth daily. Over the counter 30 tablet 3  . cholecalciferol (VITAMIN D) 1000 UNITS tablet Take 1,000 Units by mouth 2 (two) times daily.     . divalproex (DEPAKOTE ER) 500 MG 24 hr tablet Take 1 tablet (500 mg total) by mouth daily. 90 tablet 3  . IRON PO Take 1 tablet by mouth See admin instructions. Take couple times a week per patient. No specific days    . Multiple Vitamin (MULTIVITAMIN) tablet Take 1 tablet by mouth daily.    . Omega-3 Fatty Acids (FISH OIL) 1000 MG CAPS Take 1,000 mg by mouth daily.    . tamsulosin (FLOMAX) 0.4 MG CAPS capsule TAKE 1 CAPSULE (0.4 MG TOTAL) BY MOUTH DAILY AFTER SUPPER. 30 capsule 5  . traZODone (DESYREL) 50 MG tablet Take 25 mg by mouth at bedtime.     . furosemide (LASIX) 20 MG tablet 20 mg daily.      No current facility-administered medications on file prior to visit.     Allergies:  No Known Allergies  Physical Exam General: Frail elderly Caucasian male, seated, in no evident distress Head: head normocephalic and atraumatic.  Neck: supple with no carotid or supraclavicular bruits Cardiovascular: regular rate and rhythm, no murmurs Musculoskeletal: no deformity Skin:  no rash/petichiae Vascular:  Normal pulses all extremities Vitals:   04/28/18 1007  BP: 115/62  Pulse: (!) 52   Neurologic Exam Mental Status: Awake and fully alert. Oriented to place and time. Recent and remote memory intact. Attention span, concentration  and fund of knowledge appropriate. Mood and affect appropriate.  Diminished facial expression.  Positive glabellar tap. Cranial Nerves: Fundoscopic exam reveals sharp disc margins. Pupils equal, briskly reactive to light. Extraocular movements full without nystagmus. Visual fields full to confrontation. Hearing diminished bilaterally Facial sensation intact. Face, tongue, palate moves normally and symmetrically.  Motor: Normal bulk and tone. Normal strength in all tested extremity muscles.  No pill-rolling tremor but mild cogwheel rigidity at the left wrist.  Very mild bradykinesia but able to get up from a chair with arms folded Sensory.: intact to touch ,pinprick .position and vibratory sensation.  Coordination: Rapid alternating movements normal in all extremities. Finger-to-nose and heel-to-shin performed accurately bilaterally. Gait and Station: Arises from chair without difficulty. Stance is normal. Gait demonstrates normal stride length and balance but diminished left arm swing and slight dragging of the left leg.. Unable to heel, toe and tandem walk without difficulty.  Poor response to postural threat and would fall backwards if not caught.  No festination. Reflexes: 1+ and symmetric. Toes downgoing.       ASSESSMENT: 79 year Caucasian male with 2 episodes in a 3 week period of brief altered consciousness possible Compex partial seizures.  Episodes of falling backwards likely of multifactorial etiology given mild parkinsonian features and degenerative back disease.  He also has restless leg syndrome    PLAN: I had a long discussion with the patient with regards to his gait abnormality and tendency to fall backwards which likely appears to be multifactorial given his combination of degenerative lumbar spine disease, mild or typical parkinsonism as well as restless legs.  I recommend referral to physical therapy for gait and balance training.  His bradykinesia and parkinsonian symptoms are  not significant enough to justify a trial of medication at the present time.  I recommend he reduce Xanax dose and  try Topamax 25 mg at night for restless legs and increase if tolerated to 50 mg in 3 days.  Have discussed side effects with the patient and advised him to call me if needed.  Continue Depakote and the present dose for his complex partial seizures which appear to be stable.  Check MRI scan of the brain with and without contrast.  He will return for follow-up in 2 months with Gilford Raid my nurse practitioner call earlier if necessary. Greater than 50% of time during this 25 minute visit was spent on counseling,explanation of diagnosis of restless legs and parkinsonian variant, planning of further management, discussion with patient and family and coordination of care Antony Contras, MD  [facility] [address] [place], [place] [address]  Phone [phone] Fax [phone] Note: This document was prepared with digital dictation and possible smart phrase technology. Any transcriptional errors that result from this process are unintentional

## 2018-04-28 NOTE — Telephone Encounter (Signed)
Patient calling to confirm that topiramate (TOPAMAX) 25 MG tablet is for RLS.

## 2018-04-28 NOTE — Telephone Encounter (Signed)
Medicare/GEHA order sent to [facility] . No auth they will reach out to the pt to schedule.

## 2018-04-28 NOTE — Telephone Encounter (Signed)
Patient is aware and to give them a call if he has not heard from them in the next 2-3 business days at [phone].

## 2018-04-28 NOTE — Patient Instructions (Signed)
I had a long discussion with the patient with regards to his gait abnormality and tendency to fall backwards which likely appears to be multifactorial given his combination of degenerative lumbar spine disease, mild or typical parkinsonism as well as restless legs.  I recommend referral to physical therapy for gait and balance training.  His bradykinesia and parkinsonian symptoms are not significant enough to justify a trial of medication at the present time.  I recommend he reduce Xanax dose and try Topamax 25 mg at night for restless legs and increase if tolerated to 50 mg in 3 days.  Have discussed side effects with the patient and advised him to call me if needed.  Continue Depakote and the present dose for his complex partial seizures which appear to be stable.  Check MRI scan of the brain with and without contrast.  He will return for follow-up in 2 months with Kenneth Bowen my nurse practitioner call earlier if necessary.

## 2018-04-29 NOTE — Telephone Encounter (Signed)
If patient calls back stated per Dr. Leonie Man the topamax he was prescribed is for RLS.

## 2018-04-29 NOTE — Telephone Encounter (Signed)
Yes it is for RLS

## 2018-04-29 NOTE — Telephone Encounter (Signed)
LEft vm for patient to call back if topamax for RLS.

## 2018-05-10 ENCOUNTER — Ambulatory Visit
Admission: RE | Admit: 2018-05-10 | Discharge: 2018-05-10 | Disposition: A | Discharge status: Home / Self Care | Payer: Medicare Other | Source: Ambulatory Visit | Attending: Neurology | Admitting: Neurology

## 2018-05-10 DIAGNOSIS — G3281 Cerebellar ataxia in diseases classified elsewhere: Secondary | ICD-10-CM

## 2018-05-10 MED ORDER — GADOBENATE DIMEGLUMINE 529 MG/ML IV SOLN
17.0000 mL | Freq: Once | INTRAVENOUS | Status: AC | PRN
  Administered 2018-05-10: 17 mL via INTRAVENOUS

## 2018-05-16 ENCOUNTER — Telehealth: Payer: Self-pay

## 2018-05-16 NOTE — Telephone Encounter (Signed)
Rn call patient about his MR brain results. Rn stated per Dr. Leonie Man the MRI scan of the brain shows age-related changes of mild shrinkage of the brain and hardening of the arteries. No new or significant change compared with previous MRI from March 2018. No worrisome findings. PT verbalized understanding.

## 2018-05-16 NOTE — Telephone Encounter (Signed)
-----   Message from Garvin Fila, MD sent at 05/16/2018 11:41 AM EST ----- Kindly inform the patient that MRI scan of the brain shows age-related changes of mild shrinkage of the brain and hardening of the arteries.  No new or significant change compared with previous MRI from March 2018.  No worrisome findings.

## 2018-05-19 ENCOUNTER — Encounter: Payer: Self-pay | Admitting: Physical Therapy

## 2018-05-19 ENCOUNTER — Ambulatory Visit: Payer: Medicare Other | Admitting: Physical Therapy

## 2018-05-19 ENCOUNTER — Ambulatory Visit: Payer: Medicare Other | Attending: Neurology | Admitting: Physical Therapy

## 2018-05-19 ENCOUNTER — Other Ambulatory Visit: Payer: Self-pay

## 2018-05-19 DIAGNOSIS — R293 Abnormal posture: Secondary | ICD-10-CM | POA: Insufficient documentation

## 2018-05-19 DIAGNOSIS — R2681 Unsteadiness on feet: Secondary | ICD-10-CM

## 2018-05-19 DIAGNOSIS — R29818 Other symptoms and signs involving the nervous system: Secondary | ICD-10-CM | POA: Diagnosis present

## 2018-05-19 DIAGNOSIS — R2689 Other abnormalities of gait and mobility: Secondary | ICD-10-CM | POA: Insufficient documentation

## 2018-05-19 DIAGNOSIS — M6281 Muscle weakness (generalized): Secondary | ICD-10-CM | POA: Diagnosis present

## 2018-05-19 NOTE — Therapy (Signed)
[facility] [address] [place], [place], [address] Phone: [phone]   Fax:  [phone]  Physical Therapy Evaluation  Patient Details  Name: Kenneth Bowen MRN: [id number] Date of Birth: [birth year] Referring Provider (PT): Garvin Fila, MD   Encounter Date: 05/19/2018  PT End of Session - 05/19/18 2145    Visit Number  1    Number of Visits  17    Date for PT Re-Evaluation  06/18/18    Authorization Type  Medicare A & B; [employer]    Authorization Time Period  05/19/18 to 07/18/18    PT Start Time  1108    PT Stop Time  1148    PT Time Calculation (min)  40 min    Activity Tolerance  Patient tolerated treatment well    Behavior During Therapy  [facility] for tasks assessed/performed       Past Medical History:  Diagnosis Date  . Colon polyps   . Diverticulosis   . Prostate cancer [facility]) 2015   radiation finished Nov 2015  . Seizures ([place]) 07/2016    Past Surgical History:  Procedure Laterality Date  . colonscopy    . INGUINAL HERNIA REPAIR    . KNEE SURGERY    . PROSTATE BIOPSY      There were no vitals filed for this visit.   Subjective Assessment - 05/19/18 1125    Subjective  Reports whenever he falls he falls backwards. Uses his cane almost always (does some walking in home without).     Pertinent History  PMH-prostate Ca, March 2018 seizures    Limitations  Walking;Standing;Sitting    How long can you stand comfortably?  not as bad as walking    How long can you walk comfortably?  grocery store distances starts to have pain    Diagnostic tests  MRI brain normal age related changes    Patient Stated Goals  Wants to get a program of exercises and learn how to use equipment at [facility]     Currently in Pain?  No/denies   seated        [facility] PT Assessment - 05/19/18 1121      Assessment   Medical Diagnosis  Gait abnormality    Referring Provider (PT)  Garvin Fila, MD    Onset Date/Surgical  Date  --   MD referral 04/28/2018   Hand Dominance  Right    Prior Therapy  none      Precautions   Precautions  Fall      Balance Screen   Has the patient fallen in the past 6 months  No    Has the patient had a decrease in activity level because of a fear of falling?   Yes    Is the patient reluctant to leave their home because of a fear of falling?   No      Home Environment   Living Environment  Private residence    Living Arrangements  Spouse/significant other    Available Help at Discharge  Family    Type of [place]  Level entry    [place] - single point;Grab bars - tub/shower;Grab bars - toilet;Shower seat - built in;Shower seat    Additional Comments  recently moved to [place] with wife      Prior Function   Level of Independence  Independent with household mobility with device    Vocation  Retired    Leisure  prior to 2017 went to gym 2x/wk for 1 hr at a time;       Cognition   Overall Cognitive Status  Within Functional Limits for tasks assessed      Coordination   Gross Motor Movements are Fluid and Coordinated  Yes    Fine Motor Movements are Fluid and Coordinated  Yes      Posture/Postural Control   Posture/Postural Control  Postural limitations    Postural Limitations  Rounded Shoulders;Forward head      ROM / Strength   AROM / PROM / Strength  Strength      Strength   Overall Strength  Deficits    Overall Strength Comments  reports left knee weaker than right "sometimes feels like it is going to give out."      Transfers   Transfers  Sit to Stand;Stand to Sit    Sit to Stand  6: Modified independent (Device/Increase time);With upper extremity assist;From chair/3-in-1;With armrests    Comments  wide BOS come to stand and then adjusts to hip width      Ambulation/Gait   Ambulation/Gait  Yes    Ambulation/Gait Assistance  6: Modified independent (Device/Increase  time)    Ambulation Distance (Feet)  80 Feet   40, 75   Assistive device  Straight cane    Gait Pattern  Shuffle;Poor foot clearance - left;Poor foot clearance - right    Ambulation Surface  Level;Indoor    Gait velocity  32.8/19.34 sec=1.69 ft/sec       Functional Gait  Assessment   Gait assessed   Yes    Gait Level Surface  Walks 20 ft, slow speed, abnormal gait pattern, evidence for imbalance or deviates 10-15 in outside of the 12 in walkway width. Requires more than 7 sec to ambulate 20 ft.   11 sec   Change in Gait Speed  Able to change speed, demonstrates mild gait deviations, deviates 6-10 in outside of the 12 in walkway width, or no gait deviations, unable to achieve a major change in velocity, or uses a change in velocity, or uses an assistive device.    Gait with Horizontal Head Turns  Performs head turns smoothly with slight change in gait velocity (eg, minor disruption to smooth gait path), deviates 6-10 in outside 12 in walkway width, or uses an assistive device.    Gait with Vertical Head Turns  Performs task with slight change in gait velocity (eg, minor disruption to smooth gait path), deviates 6 - 10 in outside 12 in walkway width or uses assistive device    Gait and Pivot Turn  Pivot turns safely in greater than 3 sec and stops with no loss of balance, or pivot turns safely within 3 sec and stops with mild imbalance, requires small steps to catch balance.    Step Over Obstacle  Is able to step over one shoe box (4.5 in total height) but must slow down and adjust steps to clear box safely. May require verbal cueing.    Gait with Narrow Base of Support  Ambulates less than 4 steps heel to toe or cannot perform without assistance.    Gait with Eyes Closed  Walks 20 ft, slow speed, abnormal gait pattern, evidence for imbalance, deviates 10-15 in outside 12 in walkway width. Requires more than 9 sec to ambulate 20 ft.   slight stagger; drifts 10" to right  Ambulating Backwards   Walks 20 ft, slow speed, abnormal gait pattern, evidence for imbalance, deviates 10-15 in outside 12 in walkway width.    Steps  Two feet to a stair, must use rail.    Total Score  13    FGA comment:  <23 indicates high fall risk                Objective measurements completed on examination: See above findings.              PT Education - 05/19/18 2145    Education Details  results of eval; PT POC (including will have HEP)    Person(s) Educated  Patient    Methods  Explanation    Comprehension  Verbalized understanding       PT Short Term Goals - 05/19/18 2204      PT SHORT TERM GOAL #1   Title  Patient will be independent with HEP (incorporating use of gym equipment at his ILF, as appropriate) Target all STGs 06/18/18    Time  4    Period  Weeks    Status  New      PT SHORT TERM GOAL #2   Title  Improve FGA to >=16/30    Baseline  13/30    Time  4    Period  Weeks    Status  New      PT SHORT TERM GOAL #3   Title  Improve gait velocity to >=1.81 ft/sec with LRAD    Time  4    Period  Weeks    Status  New      PT SHORT TERM GOAL #4   Title  Complete 6 MWT for baseline and assist with exercise prescription    Time  4    Period  Weeks    Status  New        PT Long Term Goals - 05/19/18 2211      PT LONG TERM GOAL #1   Title  Patient will be independent with updated HEP and feel confident with transition to exercising at gym (in his ILF) Target all LTGs 07/18/18    Time  8    Period  Weeks    Status  New      PT LONG TERM GOAL #2   Title  Patient will improve FGA to >= 19/30    Time  8    Period  Weeks    Status  New      PT LONG TERM GOAL #3   Title  Patient will incr gait velocity to >=2.1 ft/sec for improved safety with community ambulation    Time  8    Period  Weeks    Status  New      PT LONG TERM GOAL #4   Title  Patient will improve 6MWT by 100 ft compared to baseline     Time  8    Period  Weeks    Status  New              Plan - 05/19/18 2148    Clinical Impression Statement  Patient referred to OPPT by Dr. Leonie Man due to "gait abnormality and tendency to fall backwards which likely appears to be multifactorial given his combination of degenerative lumbar spine disease, mild or typical parkinsonism as well as restless legs." Patient has not fallen in the past 6 months (since beginning to use a cane). Patient demonstrated high fall risk based  on FGA score of 13/30 (<23 indicates incr fall risk) and gait velocity of 1.69 ft/sec (<1.81 ft/sec incr fall risk). Patient has not returned to exercising since his fall that caused spinal compression fractures and very much wants to regain his strength and learn what exercises are safe for him to do. Patient can benefit from PT to address the deficits listed below via the interventions listed below.     History and Personal Factors relevant to plan of care:  PMH-prostate Ca, March 2018 seizures, lumbar compression fractures,     Clinical Presentation  Stable    Clinical Presentation due to:  no falls in 6 months    Clinical Decision Making  Low    Rehab Potential  Good    Clinical Impairments Affecting Rehab Potential  chronic back pain, Parkinsonism per Neuro consult (non-medicated)    PT Frequency  2x / week    PT Duration  8 weeks    PT Treatment/Interventions  ADLs/Self Care Home Management;Aquatic Therapy;DME Instruction;Moist Heat;Gait training;Functional mobility training;Therapeutic activities;Therapeutic exercise;Balance training;Patient/family education;Neuromuscular re-education;Manual techniques;Vestibular;Passive range of motion    PT Next Visit Plan  6MWT for warm-up (norm for age 83 ft) ; initiate HEP for LE strength and balance (mindful of back pain); future appts-use stepper for warm-up and add recumbent bike at his ILF to his HEP    Consulted and Agree with Plan of Care  Patient       Patient will benefit from skilled therapeutic intervention  in order to improve the following deficits and impairments:  Abnormal gait, Decreased activity tolerance, Decreased balance, Decreased endurance, Decreased knowledge of use of DME, Decreased mobility, Decreased strength, Postural dysfunction, Pain  Visit Diagnosis: Abnormal posture - Plan: PT plan of care cert/re-cert  Unsteadiness on feet - Plan: PT plan of care cert/re-cert  Other abnormalities of gait and mobility - Plan: PT plan of care cert/re-cert  Muscle weakness (generalized) - Plan: PT plan of care cert/re-cert  Other symptoms and signs involving the nervous system - Plan: PT plan of care cert/re-cert     Problem List Patient Active Problem List   Diagnosis Date Noted  . Therapeutic drug monitoring 06/10/2017  . Partial symptomatic epilepsy with complex partial seizures, not intractable, without status epilepticus ([place]) 12/05/2016  . Syncope 07/29/2016  . Unresponsiveness 07/28/2016  . BPH (benign prostatic hyperplasia) 07/28/2016  . Dyslipidemia 07/28/2016  . Anxiety   . History of prostate cancer   . TIA (transient ischemic attack) 07/12/2016    Rexanne Mano, PT 05/19/2018, 10:19 PM  [facility] [address] [place], [place], [address] Phone: [phone]   Fax:  [phone]  Name: Kenneth Bowen MRN: [id number] Date of Birth: 1930-07-26

## 2018-05-21 ENCOUNTER — Ambulatory Visit: Payer: Medicare Other

## 2018-05-23 ENCOUNTER — Encounter: Payer: Self-pay | Admitting: Physical Therapy

## 2018-05-23 ENCOUNTER — Ambulatory Visit: Payer: Medicare Other | Admitting: Physical Therapy

## 2018-05-23 DIAGNOSIS — R2681 Unsteadiness on feet: Secondary | ICD-10-CM

## 2018-05-23 DIAGNOSIS — R2689 Other abnormalities of gait and mobility: Secondary | ICD-10-CM

## 2018-05-23 DIAGNOSIS — M6281 Muscle weakness (generalized): Secondary | ICD-10-CM

## 2018-05-23 DIAGNOSIS — R293 Abnormal posture: Secondary | ICD-10-CM | POA: Diagnosis not present

## 2018-05-23 DIAGNOSIS — R29818 Other symptoms and signs involving the nervous system: Secondary | ICD-10-CM

## 2018-05-23 NOTE — Patient Instructions (Addendum)
Access Code: Access Code: [email or web address]  URL: [email or web address]  Date: 05/23/2018  Prepared by: Nita Sells   Exercises  Staggered Stance Step Throughs - 10 reps - 3 sets - 1x daily - 7x weekly  Forward and Backward Weight Shift with Stepping at [place] - 30 reps - 2x daily - 7x weekly  Heel rises with counter support - 15 reps - 2x daily - 7x weekly  Toe Raises with Counter Support - 15 reps - 2x daily - 7x weekly  Standing March with Counter Support - 15 reps - 2x daily - 7x weekly    URL: [email or web address]  Date: 05/23/2018  Prepared by: Nita Sells   Exercises  Staggered Stance Step Throughs - 10 reps - 3 sets - 1x daily - 7x weekly  Forward and Backward Weight Shift with Stepping at [place] - 30 reps - 2x daily - 7x weekly  Heel rises with counter support - 15 reps - 2x daily - 7x weekly  Toe Raises with Counter Support - 15 reps - 2x daily - 7x weekly  Standing March with Counter Support - 15 reps - 2x daily - 7x weekly

## 2018-05-23 NOTE — Therapy (Signed)
[facility] [address] [place], [place], [address] Phone: [phone]   Fax:  [phone]  Physical Therapy Treatment  Patient Details  Name: Kenneth Bowen MRN: [id number] Date of Birth: 04-19-1931 Referring Provider (PT): Garvin Fila, MD   Encounter Date: 05/23/2018  PT End of Session - 05/23/18 1700    Visit Number  2    Number of Visits  17    Date for PT Re-Evaluation  06/18/18    Authorization Type  Medicare A & B; [employer]    Authorization Time Period  05/19/18 to 07/18/18    PT Start Time  8242    PT Stop Time  1403    PT Time Calculation (min)  46 min    Activity Tolerance  Patient tolerated treatment well    Behavior During Therapy  [facility] for tasks assessed/performed       Past Medical History:  Diagnosis Date  . Colon polyps   . Diverticulosis   . Prostate cancer [facility]) 2015   radiation finished Nov 2015  . Seizures ([place]) 07/2016    Past Surgical History:  Procedure Laterality Date  . colonscopy    . INGUINAL HERNIA REPAIR    . KNEE SURGERY    . PROSTATE BIOPSY      There were no vitals filed for this visit.  Subjective Assessment - 05/23/18 1320    Subjective  Denies falls or changes since last visit.  Still has back pain with certain activities.  Has been able to sleep in the bed again with pillows to elevate his head to simulate recliner position.  Had been sleeping in recliner on and off since 2017.    Pertinent History  PMH-prostate Ca, March 2018 seizures    Limitations  Walking;Standing;Sitting    How long can you stand comfortably?  not as bad as walking    How long can you walk comfortably?  grocery store distances starts to have pain    Diagnostic tests  MRI brain normal age related changes    Patient Stated Goals  Wants to get a program of exercises and learn how to use equipment at [facility]     Currently in Pain?  No/denies         [facility] PT Assessment - 05/23/18 0001       6 minute walk test results    Aerobic Endurance Distance Walked  832   with SPC level indoor surface     [facility] Adult PT Treatment/Exercise - 05/23/18 0001      Transfers   Transfers  Sit to Stand;Stand to Sit    Sit to Stand  6: Modified independent (Device/Increase time);With upper extremity assist;From chair/3-in-1;With armrests      Ambulation/Gait   Ambulation/Gait  Yes    Ambulation/Gait Assistance  6: Modified independent (Device/Increase time)    Ambulation Distance (Feet)  100 Feet   x2 and 345'' x 1 in addition to 6 MWT   Assistive device  Straight cane    Gait Pattern  Shuffle;Poor foot clearance - left;Poor foot clearance - right    Ambulation Surface  Level;Indoor    Gait Comments  worked on increasing step length and improving foot clearance with gait.  Pt able to improve with cues but reverts back to same pattern without cues.      Neuro Re-ed    Neuro Re-ed Details   At counter performed forward, backward and forward<>backward step weight shifts at the counter  with 1 UE support.  Educated pt on purpose of activity and carry over to gait in improving step length and clearance.  Performed >30 reps each side and direction then >30 x 2 sets of forward<>backward.  Did carryover into ambulation with cues but reverts back without cues.          PT Short Term Goals - 05/19/18 2204      PT SHORT TERM GOAL #1   Title  Patient will be independent with HEP (incorporating use of gym equipment at his ILF, as appropriate) Target all STGs 06/18/18    Time  4    Period  Weeks    Status  New      PT SHORT TERM GOAL #2   Title  Improve FGA to >=16/30    Baseline  13/30    Time  4    Period  Weeks    Status  New      PT SHORT TERM GOAL #3   Title  Improve gait velocity to >=1.81 ft/sec with LRAD    Time  4    Period  Weeks    Status  New      PT SHORT TERM GOAL #4   Title  Complete 6 MWT for baseline and assist with exercise prescription    Time  4    Period  Weeks     Status  New        PT Long Term Goals - 05/19/18 2211      PT LONG TERM GOAL #1   Title  Patient will be independent with updated HEP and feel confident with transition to exercising at gym (in his ILF) Target all LTGs 07/18/18    Time  8    Period  Weeks    Status  New      PT LONG TERM GOAL #2   Title  Patient will improve FGA to >= 19/30    Time  8    Period  Weeks    Status  New      PT LONG TERM GOAL #3   Title  Patient will incr gait velocity to >=2.1 ft/sec for improved safety with community ambulation    Time  8    Period  Weeks    Status  New      PT LONG TERM GOAL #4   Title  Patient will improve 6MWT by 100 ft compared to baseline     Time  8    Period  Weeks    Status  New            Plan - 05/23/18 1402    Clinical Impression Statement  Pt appears motivated to improve mobility and very receptive to cues/suggestions.  Skilled session focused on gait and weight shifting activities to carry over to gait.  Pt needs frequent cues or quickly reverts back to his normal gait pattern.  Pt amb 832' in 6MWT and normal for his age is 91.  Continue PT per POC.    Rehab Potential  Good    Clinical Impairments Affecting Rehab Potential  chronic back pain, Parkinsonism per Neuro consult (non-medicated)    PT Frequency  2x / week    PT Duration  8 weeks    PT Treatment/Interventions  ADLs/Self Care Home Management;Aquatic Therapy;DME Instruction;Moist Heat;Gait training;Functional mobility training;Therapeutic activities;Therapeutic exercise;Balance training;Patient/family education;Neuromuscular re-education;Manual techniques;Vestibular;Passive range of motion    PT Next Visit Plan  Review HEP and add LE strength and  balance (mindful of back pain).  Try use scifit/nustep for warm-up and add recumbent bike at his ILF to his HEP    PT Home Exercise Plan   Access Code: [email or web address]     Consulted and Agree with Plan of Care  Patient       Patient will benefit from skilled  therapeutic intervention in order to improve the following deficits and impairments:  Abnormal gait, Decreased activity tolerance, Decreased balance, Decreased endurance, Decreased knowledge of use of DME, Decreased mobility, Decreased strength, Postural dysfunction, Pain  Visit Diagnosis: Abnormal posture  Unsteadiness on feet  Other abnormalities of gait and mobility  Muscle weakness (generalized)  Other symptoms and signs involving the nervous system     Problem List Patient Active Problem List   Diagnosis Date Noted  . Therapeutic drug monitoring 06/10/2017  . Partial symptomatic epilepsy with complex partial seizures, not intractable, without status epilepticus ([place]) 12/05/2016  . Syncope 07/29/2016  . Unresponsiveness 07/28/2016  . BPH (benign prostatic hyperplasia) 07/28/2016  . Dyslipidemia 07/28/2016  . Anxiety   . History of prostate cancer   . TIA (transient ischemic attack) 07/12/2016    Narda Bonds, PTA [place] 05/23/18 5:07 PM Phone: [phone] Fax: [facility] [address] [place], [place], [address] Phone: [phone]   Fax:  [phone]  Name: Kenneth Bowen MRN: [id number] Date of Birth: May 28, 1930

## 2018-05-28 ENCOUNTER — Ambulatory Visit: Payer: Medicare Other | Admitting: Physical Therapy

## 2018-05-28 ENCOUNTER — Encounter: Payer: Self-pay | Admitting: Physical Therapy

## 2018-05-28 DIAGNOSIS — R2681 Unsteadiness on feet: Secondary | ICD-10-CM

## 2018-05-28 DIAGNOSIS — R29818 Other symptoms and signs involving the nervous system: Secondary | ICD-10-CM

## 2018-05-28 DIAGNOSIS — M6281 Muscle weakness (generalized): Secondary | ICD-10-CM

## 2018-05-28 DIAGNOSIS — R2689 Other abnormalities of gait and mobility: Secondary | ICD-10-CM

## 2018-05-28 DIAGNOSIS — R293 Abnormal posture: Secondary | ICD-10-CM | POA: Diagnosis not present

## 2018-05-28 NOTE — Therapy (Signed)
[facility] [address] [place], [place], [address] Phone: [phone]   Fax:  [phone]  Physical Therapy Treatment  Patient Details  Name: Kenneth Bowen MRN: [id number] Date of Birth: 1931-04-30 Referring Provider (PT): Garvin Fila, MD   Encounter Date: 05/28/2018  PT End of Session - 05/28/18 1408    Visit Number  3    Number of Visits  17    Date for PT Re-Evaluation  06/18/18    Authorization Type  Medicare A & B; [employer]    Authorization Time Period  05/19/18 to 07/18/18    PT Start Time  1318    PT Stop Time  1400    PT Time Calculation (min)  42 min    Activity Tolerance  Patient tolerated treatment well    Behavior During Therapy  [facility] for tasks assessed/performed       Past Medical History:  Diagnosis Date  . Colon polyps   . Diverticulosis   . Prostate cancer [facility]) 2015   radiation finished Nov 2015  . Seizures ([place]) 07/2016    Past Surgical History:  Procedure Laterality Date  . colonscopy    . INGUINAL HERNIA REPAIR    . KNEE SURGERY    . PROSTATE BIOPSY      There were no vitals filed for this visit.  Subjective Assessment - 05/28/18 1325    Subjective  Denies falls or changes.    Pertinent History  PMH-prostate Ca, March 2018 seizures    Limitations  Walking;Standing;Sitting    How long can you stand comfortably?  not as bad as walking    How long can you walk comfortably?  grocery store distances starts to have pain    Diagnostic tests  MRI brain normal age related changes    Patient Stated Goals  Wants to get a program of exercises and learn how to use equipment at [facility]     Currently in Pain?  No/denies                       [facility] Adult PT Treatment/Exercise - 05/28/18 0001      Neuro Re-ed    Neuro Re-ed Details   At counter performed forward, backward and forward<>backward step weight shifts at the counter with 1 UE support.  Performed >30 reps each  side and direction then >30 x 2 sets of forward<>backward.        Exercises   Exercises  Knee/Hip      Knee/Hip Exercises: Aerobic   Nustep  Workload 4 x 10 minutes with steps per minute > 70.  See pt instructions for guidelines for home use.        PWR [facility]) - 05/28/18 1405    PWR! exercises  Moves in standing    PWR! Up  x 20    PWR! Rock  x 20    PWR Step  x 20    Comments  Performed standing at counter with UE support then repeated standing at back of chair for support x 20 each.  Pt needs cues for technique and intensity.  Provided as HEP.  Did not have pt perform PWR! Twist due to previous back issues.            PT Short Term Goals - 05/19/18 2204      PT SHORT TERM GOAL #1   Title  Patient will be independent with HEP (incorporating use of gym  equipment at his ILF, as appropriate) Target all STGs 06/18/18    Time  4    Period  Weeks    Status  New      PT SHORT TERM GOAL #2   Title  Improve FGA to >=16/30    Baseline  13/30    Time  4    Period  Weeks    Status  New      PT SHORT TERM GOAL #3   Title  Improve gait velocity to >=1.81 ft/sec with LRAD    Time  4    Period  Weeks    Status  New      PT SHORT TERM GOAL #4   Title  Complete 6 MWT for baseline and assist with exercise prescription    Time  4    Period  Weeks    Status  New        PT Long Term Goals - 05/19/18 2211      PT LONG TERM GOAL #1   Title  Patient will be independent with updated HEP and feel confident with transition to exercising at gym (in his ILF) Target all LTGs 07/18/18    Time  8    Period  Weeks    Status  New      PT LONG TERM GOAL #2   Title  Patient will improve FGA to >= 19/30    Time  8    Period  Weeks    Status  New      PT LONG TERM GOAL #3   Title  Patient will incr gait velocity to >=2.1 ft/sec for improved safety with community ambulation    Time  8    Period  Weeks    Status  New      PT LONG TERM GOAL #4   Title  Patient will improve 6MWT by  100 ft compared to baseline     Time  8    Period  Weeks    Status  New            Plan - 05/28/18 1410    Clinical Impression Statement  Skilled session focused on addition of PWR! Moves in standing to HEP, reviewing HEP from previous session and trial of Nustep for endurance so pt can use at [employer].  Pt continues to need cues through out treatment on technique.  Continue PT per POC.    Rehab Potential  Good    Clinical Impairments Affecting Rehab Potential  chronic back pain, Parkinsonism per Neuro consult (non-medicated)    PT Frequency  2x / week    PT Duration  8 weeks    PT Treatment/Interventions  ADLs/Self Care Home Management;Aquatic Therapy;DME Instruction;Moist Heat;Gait training;Functional mobility training;Therapeutic activities;Therapeutic exercise;Balance training;Patient/family education;Neuromuscular re-education;Manual techniques;Vestibular;Passive range of motion    PT Next Visit Plan  Review standing PWR moves.  Try seated PWR moves (be mindful of back pain).  Ask if pt was able to try Nustep at ILF.    PT Home Exercise Plan   Access Code: [email or web address] ;PWR! Moves in Standing    Consulted and Agree with Plan of Care  Patient       Patient will benefit from skilled therapeutic intervention in order to improve the following deficits and impairments:  Abnormal gait, Decreased activity tolerance, Decreased balance, Decreased endurance, Decreased knowledge of use of DME, Decreased mobility, Decreased strength, Postural dysfunction, Pain  Visit Diagnosis: Abnormal posture  Unsteadiness on feet  Other abnormalities of gait and mobility  Muscle weakness (generalized)  Other symptoms and signs involving the nervous system     Problem List Patient Active Problem List   Diagnosis Date Noted  . Therapeutic drug monitoring 06/10/2017  . Partial symptomatic epilepsy with complex partial seizures, not intractable, without status epilepticus ([place]) 12/05/2016  .  Syncope 07/29/2016  . Unresponsiveness 07/28/2016  . BPH (benign prostatic hyperplasia) 07/28/2016  . Dyslipidemia 07/28/2016  . Anxiety   . History of prostate cancer   . TIA (transient ischemic attack) 07/12/2016    Narda Bonds, PTA [place] 05/28/18 2:15 PM Phone: [phone] Fax: [facility] [address] [place], [place], [address] Phone: [phone]   Fax:  [phone]  Name: Kenneth Bowen MRN: [id number] Date of Birth: 03-19-1931

## 2018-05-28 NOTE — Patient Instructions (Signed)
Nu Step: Workload 4 Arms at 12 Legs at 11 Keep steps per minute above 70 Try to go 10 minutes 3-5x/week. After a week try increasing your time by 2 minutes.

## 2018-05-30 ENCOUNTER — Encounter: Payer: Self-pay | Admitting: Physical Therapy

## 2018-05-30 ENCOUNTER — Ambulatory Visit: Payer: Medicare Other | Admitting: Physical Therapy

## 2018-05-30 DIAGNOSIS — R2681 Unsteadiness on feet: Secondary | ICD-10-CM

## 2018-05-30 DIAGNOSIS — R293 Abnormal posture: Secondary | ICD-10-CM

## 2018-05-30 DIAGNOSIS — R2689 Other abnormalities of gait and mobility: Secondary | ICD-10-CM

## 2018-05-31 NOTE — Therapy (Signed)
[facility] [address] [place], [place], [address] Phone: [phone]   Fax:  [phone]  Physical Therapy Treatment  Patient Details  Name: Kenneth Bowen MRN: [id number] Date of Birth: 11-06-1930 Referring Provider (PT): Garvin Fila, MD   Encounter Date: 05/30/2018  PT End of Session - 05/30/18 1443    Visit Number  4    Number of Visits  17    Date for PT Re-Evaluation  06/18/18    Authorization Type  Medicare A & B; [employer]    Authorization Time Period  05/19/18 to 07/18/18    PT Start Time  1445    PT Stop Time  1533    PT Time Calculation (min)  48 min    Activity Tolerance  Patient tolerated treatment well    Behavior During Therapy  [facility] for tasks assessed/performed       Past Medical History:  Diagnosis Date  . Colon polyps   . Diverticulosis   . Prostate cancer [facility]) 2015   radiation finished Nov 2015  . Seizures ([place]) 07/2016    Past Surgical History:  Procedure Laterality Date  . colonscopy    . INGUINAL HERNIA REPAIR    . KNEE SURGERY    . PROSTATE BIOPSY      There were no vitals filed for this visit.  Subjective Assessment - 05/30/18 1449    Subjective  Did not get to try the bike at the ILF yesterday (did other exercises but ran out of time to go to the bike). His right shoulder and both hips are a little sore from the exercises. Emphasizes not painful, just sore.     Pertinent History  PMH-prostate Ca, March 2018 seizures, lumbar compression fractures, restless legs and parkinsonian variant    Limitations  Walking;Standing;Sitting    How long can you stand comfortably?  not as bad as walking    How long can you walk comfortably?  grocery store distances starts to have pain    Diagnostic tests  MRI brain normal age related changes    Patient Stated Goals  Wants to get a program of exercises and learn how to use equipment at [facility]     Currently in Pain?  No/denies                        [facility] Adult PT Treatment/Exercise - 05/30/18 1700      Ambulation/Gait   Ambulation/Gait Assistance  5: Supervision    Ambulation/Gait Assistance Details  vc for stride length, heel strike and incr foot clearance (drags heels); utilized treadmill for 2 minutes to see if pt could hear his feet dragging, however he could not    Ambulation Distance (Feet)  120 Feet   x 3 throughout session   Pre-Gait Activities  RUE support in // bars; step with heel first, toes up, wt-shift onto forward leg with hip, knee extended and step back. Alternating legs x 20 reps. Improved foot clearance and heelstrike with ambulation afterwards        PWR [facility]) - 05/30/18 1518    PWR! exercises  Moves in standing;Moves in supine    PWR! Rock Supine  10   5 each side   Comments  pt sleeps on numerous pillows to create a "wedge"; placed on blue wedge with 2 pillows under head, one under back length-wise for pt to be able to achieve supine; very gentle (limited ROM, rolling) for  PWR! Rock to begin to get some rotation in spine; pt tolerated well with no incr pain    Standing PWR! Up  20    PWR! Rock  20    PWR Step  20    Comments  Performed standing at counter with UE support for PWR! step only. (Progressed to no UE support, however movements became smaller due to feeling of imbalance. Pt needs min cues for technique and intensity. Did not have pt perform PWR! Twist due to previous back issues.            PT Short Term Goals - 05/19/18 2204      PT SHORT TERM GOAL #1   Title  Patient will be independent with HEP (incorporating use of gym equipment at his ILF, as appropriate) Target all STGs 06/18/18    Time  4    Period  Weeks    Status  New      PT SHORT TERM GOAL #2   Title  Improve FGA to >=16/30    Baseline  13/30    Time  4    Period  Weeks    Status  New      PT SHORT TERM GOAL #3   Title  Improve gait velocity to >=1.81 ft/sec with LRAD    Time  4     Period  Weeks    Status  New      PT SHORT TERM GOAL #4   Title  Complete 6 MWT for baseline and assist with exercise prescription    Time  4    Period  Weeks    Status  New        PT Long Term Goals - 05/19/18 2211      PT LONG TERM GOAL #1   Title  Patient will be independent with updated HEP and feel confident with transition to exercising at gym (in his ILF) Target all LTGs 07/18/18    Time  8    Period  Weeks    Status  New      PT LONG TERM GOAL #2   Title  Patient will improve FGA to >= 19/30    Time  8    Period  Weeks    Status  New      PT LONG TERM GOAL #3   Title  Patient will incr gait velocity to >=2.1 ft/sec for improved safety with community ambulation    Time  8    Period  Weeks    Status  New      PT LONG TERM GOAL #4   Title  Patient will improve 6MWT by 100 ft compared to baseline     Time  8    Period  Weeks    Status  New            Plan - 05/30/18 1700    Clinical Impression Statement  Skilled session focused on technique with PWR! standing exercises, introduction of modified PWR! supine rock to begin to get trunk rotation, and focus on gait training to improve bil foot clearance to reduce fall risk. Patient responds well to cues and is able to make adjustments recommended. He can continue to benefit from PT.     History and Personal Factors relevant to plan of care:  PMH-prostate Ca, March 2018 seizures, lumbar compression fractures, restless legs and parkinsonian variant    Rehab Potential  Good    Clinical Impairments Affecting Rehab Potential  chronic back pain, Parkinsonism per Neuro consult (non-medicated)    PT Frequency  2x / week    PT Duration  8 weeks    PT Treatment/Interventions  ADLs/Self Care Home Management;Aquatic Therapy;DME Instruction;Moist Heat;Gait training;Functional mobility training;Therapeutic activities;Therapeutic exercise;Balance training;Patient/family education;Neuromuscular re-education;Manual  techniques;Vestibular;Passive range of motion    PT Next Visit Plan  Warm-up on Nustep; Review standing PWR moves at counter (practice with UE support for PWR step and progress to no UE support); Try modified supine PWR moves--on blue wedge with pillows and knees bent (be mindful of back pain).  Ask if pt was able to try Nustep at ILF.    PT Home Exercise Plan   Access Code: [email or web address] ;PWR! Moves in Standing    Consulted and Agree with Plan of Care  Patient       Patient will benefit from skilled therapeutic intervention in order to improve the following deficits and impairments:  Abnormal gait, Decreased activity tolerance, Decreased balance, Decreased endurance, Decreased knowledge of use of DME, Decreased mobility, Decreased strength, Postural dysfunction, Pain  Visit Diagnosis: Abnormal posture  Unsteadiness on feet  Other abnormalities of gait and mobility     Problem List Patient Active Problem List   Diagnosis Date Noted  . Therapeutic drug monitoring 06/10/2017  . Partial symptomatic epilepsy with complex partial seizures, not intractable, without status epilepticus ([place]) 12/05/2016  . Syncope 07/29/2016  . Unresponsiveness 07/28/2016  . BPH (benign prostatic hyperplasia) 07/28/2016  . Dyslipidemia 07/28/2016  . Anxiety   . History of prostate cancer   . TIA (transient ischemic attack) 07/12/2016    Rexanne Mano, PT 05/31/2018, 9:39 AM  [facility] [address] [place], [place], [address] Phone: [phone]   Fax:  [phone]  Name: Kenneth Bowen MRN: [id number] Date of Birth: [birth year]

## 2018-06-02 ENCOUNTER — Encounter: Payer: Self-pay | Admitting: Physical Therapy

## 2018-06-02 ENCOUNTER — Ambulatory Visit: Payer: Medicare Other | Admitting: Physical Therapy

## 2018-06-02 DIAGNOSIS — R2689 Other abnormalities of gait and mobility: Secondary | ICD-10-CM

## 2018-06-02 DIAGNOSIS — R2681 Unsteadiness on feet: Secondary | ICD-10-CM

## 2018-06-02 DIAGNOSIS — R293 Abnormal posture: Secondary | ICD-10-CM | POA: Diagnosis not present

## 2018-06-02 DIAGNOSIS — R29818 Other symptoms and signs involving the nervous system: Secondary | ICD-10-CM

## 2018-06-02 DIAGNOSIS — M6281 Muscle weakness (generalized): Secondary | ICD-10-CM

## 2018-06-02 NOTE — Therapy (Signed)
[facility] [address] [place], [place], [address] Phone: [phone]   Fax:  [phone]  Physical Therapy Treatment  Patient Details  Name: Kenneth Bowen MRN: [id number] Date of Birth: [birth year] Referring Provider (PT): Garvin Fila, MD   Encounter Date: 06/02/2018  PT End of Session - 06/02/18 1641    Visit Number  5    Number of Visits  17    Date for PT Re-Evaluation  06/18/18    Authorization Type  Medicare A & B; [employer]    Authorization Time Period  05/19/18 to 07/18/18    PT Start Time  1316    PT Stop Time  1401    PT Time Calculation (min)  45 min    Activity Tolerance  Patient tolerated treatment well    Behavior During Therapy  [facility] for tasks assessed/performed       Past Medical History:  Diagnosis Date  . Colon polyps   . Diverticulosis   . Prostate cancer [facility]) 2015   radiation finished Nov 2015  . Seizures ([place]) 07/2016    Past Surgical History:  Procedure Laterality Date  . colonscopy    . INGUINAL HERNIA REPAIR    . KNEE SURGERY    . PROSTATE BIOPSY      There were no vitals filed for this visit.  Subjective Assessment - 06/02/18 1322    Subjective  Was able to use recumbent bike at ILF for 10 minutes.  Feels like reaching is aggravating his R shoulder..  R hip also a little sore.  Everything seems to aggravate it but not severe.  Denies any change after Scifit today.    Pertinent History  PMH-prostate Ca, March 2018 seizures, lumbar compression fractures, restless legs and parkinsonian variant    Limitations  Walking;Standing;Sitting    How long can you stand comfortably?  not as bad as walking    How long can you walk comfortably?  grocery store distances starts to have pain    Diagnostic tests  MRI brain normal age related changes    Patient Stated Goals  Wants to get a program of exercises and learn how to use equipment at [facility]     Currently in Pain?  No/denies    soreness in R hip and R shoulder but denies it as painful                      [facility] Adult PT Treatment/Exercise - 06/02/18 0001      Knee/Hip Exercises: Aerobic   Other Aerobic  Scifit level 1.5 all 4 extremities x 10 minutes at rpm>75.        PWR [facility]) - 06/02/18 1636    PWR! exercises  Moves in sitting;Moves in supine;Moves in standing    PWR! Rock  x 20 x 2 sets    PWR! Up  20    PWR! Rock  20    PWR Step  20    PWR! Up  20    PWR! Rock  20    PWR! Step  20    Comments  Did not perform Twist due to previous back issues        pt sleeps on numerous pillows to create a "wedge"; placed on blue wedge with 2 pillows under head, one under back length-wise for pt to be able to achieve supine; very gentle (limited ROM, rolling) for PWR! Rock to begin to get some rotation in  spine; pt tolerated well with no incr pain     Performed standing at counter with UE support for PWR! step only. (Progressed to no UE support, however movements became smaller due to feeling of imbalance. Pt needs min cues for technique and intensity. Did not have pt perform PWR! Twist due to previous back issues.  Limited UE reach with PWR! Rock due to pt having some R shoulder discomfort today as well.       PT Short Term Goals - 05/19/18 2204      PT SHORT TERM GOAL #1   Title  Patient will be independent with HEP (incorporating use of gym equipment at his ILF, as appropriate) Target all STGs 06/18/18    Time  4    Period  Weeks    Status  New      PT SHORT TERM GOAL #2   Title  Improve FGA to >=16/30    Baseline  13/30    Time  4    Period  Weeks    Status  New      PT SHORT TERM GOAL #3   Title  Improve gait velocity to >=1.81 ft/sec with LRAD    Time  4    Period  Weeks    Status  New      PT SHORT TERM GOAL #4   Title  Complete 6 MWT for baseline and assist with exercise prescription    Time  4    Period  Weeks    Status  New        PT Long Term Goals - 05/19/18 2211       PT LONG TERM GOAL #1   Title  Patient will be independent with updated HEP and feel confident with transition to exercising at gym (in his ILF) Target all LTGs 07/18/18    Time  8    Period  Weeks    Status  New      PT LONG TERM GOAL #2   Title  Patient will improve FGA to >= 19/30    Time  8    Period  Weeks    Status  New      PT LONG TERM GOAL #3   Title  Patient will incr gait velocity to >=2.1 ft/sec for improved safety with community ambulation    Time  8    Period  Weeks    Status  New      PT LONG TERM GOAL #4   Title  Patient will improve 6MWT by 100 ft compared to baseline     Time  8    Period  Weeks    Status  New            Plan - 06/02/18 1641    Clinical Impression Statement  Skilled session focused on PWR! exercises.  [name] technique with standing PWR! improved from when this PTA saw on a previous session so is having good carryover to instructions.  Pt still with c/o R hip discomfort but can not relate it to any movement in particular and denies it as pain.  Adapted exercises due to previous back issues.  Continue PT per POC.    Rehab Potential  Good    Clinical Impairments Affecting Rehab Potential  chronic back pain, Parkinsonism per Neuro consult (non-medicated)    PT Frequency  2x / week    PT Duration  8 weeks    PT Treatment/Interventions  ADLs/Self Care Home Management;Aquatic  Therapy;DME Instruction;Moist Heat;Gait training;Functional mobility training;Therapeutic activities;Therapeutic exercise;Balance training;Patient/family education;Neuromuscular re-education;Manual techniques;Vestibular;Passive range of motion    PT Next Visit Plan  Warm-up on Nustep; Continue with PWR moves in seated and standing. Try modified supine PWR moves--on blue wedge with pillows and knees bent (be mindful of back pain).  Gait working on step length.    PT Home Exercise Plan   Access Code: [email or web address] ;PWR! Moves in Standing    Consulted and Agree with Plan of Care   Patient       Patient will benefit from skilled therapeutic intervention in order to improve the following deficits and impairments:  Abnormal gait, Decreased activity tolerance, Decreased balance, Decreased endurance, Decreased knowledge of use of DME, Decreased mobility, Decreased strength, Postural dysfunction, Pain  Visit Diagnosis: Abnormal posture  Unsteadiness on feet  Other abnormalities of gait and mobility  Muscle weakness (generalized)  Other symptoms and signs involving the nervous system     Problem List Patient Active Problem List   Diagnosis Date Noted  . Therapeutic drug monitoring 06/10/2017  . Partial symptomatic epilepsy with complex partial seizures, not intractable, without status epilepticus ([place]) 12/05/2016  . Syncope 07/29/2016  . Unresponsiveness 07/28/2016  . BPH (benign prostatic hyperplasia) 07/28/2016  . Dyslipidemia 07/28/2016  . Anxiety   . History of prostate cancer   . TIA (transient ischemic attack) 07/12/2016   Narda Bonds, PTA [place] 06/02/18 4:49 PM Phone: [phone] Fax: [facility] [address] [place], [place], [address] Phone: [phone]   Fax:  [phone]  Name: Kenneth Bowen MRN: [id number] Date of Birth: 1930-06-25

## 2018-06-06 ENCOUNTER — Encounter: Payer: Self-pay | Admitting: Physical Therapy

## 2018-06-06 ENCOUNTER — Ambulatory Visit: Payer: Medicare Other | Admitting: Physical Therapy

## 2018-06-06 DIAGNOSIS — R293 Abnormal posture: Secondary | ICD-10-CM | POA: Diagnosis not present

## 2018-06-06 DIAGNOSIS — R2681 Unsteadiness on feet: Secondary | ICD-10-CM

## 2018-06-06 DIAGNOSIS — M6281 Muscle weakness (generalized): Secondary | ICD-10-CM

## 2018-06-06 DIAGNOSIS — R2689 Other abnormalities of gait and mobility: Secondary | ICD-10-CM

## 2018-06-07 NOTE — Therapy (Signed)
[facility] [address] [place], [place], [address] Phone: [phone]   Fax:  [phone]  Physical Therapy Treatment  Patient Details  Name: Kenneth Bowen MRN: [id number] Date of Birth: 1931-03-23 Referring Provider (PT): Garvin Fila, MD   Encounter Date: 06/06/2018  PT End of Session - 06/06/18 1407    Visit Number  6    Number of Visits  17    Date for PT Re-Evaluation  06/18/18    Authorization Type  Medicare A & B; [employer]    Authorization Time Period  05/19/18 to 07/18/18    PT Start Time  1405    PT Stop Time  1450    PT Time Calculation (min)  45 min    Activity Tolerance  Patient tolerated treatment well    Behavior During Therapy  [facility] for tasks assessed/performed       Past Medical History:  Diagnosis Date  . Colon polyps   . Diverticulosis   . Prostate cancer [facility]) 2015   radiation finished Nov 2015  . Seizures ([place]) 07/2016    Past Surgical History:  Procedure Laterality Date  . colonscopy    . INGUINAL HERNIA REPAIR    . KNEE SURGERY    . PROSTATE BIOPSY      There were no vitals filed for this visit.  Subjective Assessment - 06/06/18 1411    Subjective  Still has soreness on right hip. (Patient actually points to a spot just above his iliac crest (posterolaterally) with pain then radiating down over posterior pelvis/gluts)    Pertinent History  PMH-prostate Ca, March 2018 seizures, lumbar compression fractures, restless legs and parkinsonian variant    Limitations  Walking;Standing;Sitting    How long can you stand comfortably?  not as bad as walking    How long can you walk comfortably?  grocery store distances starts to have pain    Diagnostic tests  MRI brain normal age related changes    Patient Stated Goals  Wants to get a program of exercises and learn how to use equipment at [facility]     Currently in Pain?  Yes    Pain Score  3     Pain Location  Pelvis    Pain  Orientation  Right;Posterior;Upper    Pain Type  Chronic pain    Pain Radiating Towards  right buttock    Pain Onset  More than a month ago    Pain Frequency  Intermittent    Aggravating Factors   unsure    Effect of Pain on Daily Activities  not severe enough to effect daily activities; does effect technique on PWR! [place] Adult PT Treatment/Exercise - 06/06/18 1413      Ambulation/Gait   Ambulation/Gait Assistance  5: Supervision    Ambulation/Gait Assistance Details  vc for heel strike and step length, LUE swing (carries cane on rt)    Ambulation Distance (Feet)  200 Feet   x 2   Assistive device  Straight cane    Gait Pattern  Poor foot clearance - left;Poor foot clearance - right;Step-through pattern;Decreased arm swing - left;Decreased stride length    Ambulation Surface  Level;Unlevel    Pre-Gait Activities  at counter, step outside leg forward and then backward (not stopping in between) focus on heel strike, anterior wt-shift with extended hip  and knee; as step backwards, sliight bend at hips with shoulders forward to prevent LOB posteriorly, finally adding UE swing of free hand 30 reps each side    Gait Comments  gait improved after pre-gait exercise      Knee/Hip Exercises: Aerobic   Nustep  L4 x 4 minutes, UEs/LEs (warm-up)        PWR ([facility]) - 06/06/18 1700    PWR! exercises  Moves in standing    PWR! Up  20    PWR! Rock  20    PWR Step  20    Comments  standing at counter, light UE support; [employer] with hand reaching no higher than shoulder height (to rt due to rt shoulder pain, to lt due to rt "hip" pain)          PT Education - 06/07/18 0802    Education Details  updates to HEP    Person(s) Educated  Patient    Methods  Explanation;Demonstration;Verbal cues;Handout    Comprehension  Verbalized understanding;Returned demonstration;Verbal cues required;Need further instruction       PT Short Term Goals - 05/19/18  2204      PT SHORT TERM GOAL #1   Title  Patient will be independent with HEP (incorporating use of gym equipment at his ILF, as appropriate) Target all STGs 06/18/18    Time  4    Period  Weeks    Status  New      PT SHORT TERM GOAL #2   Title  Improve FGA to >=16/30    Baseline  13/30    Time  4    Period  Weeks    Status  New      PT SHORT TERM GOAL #3   Title  Improve gait velocity to >=1.81 ft/sec with LRAD    Time  4    Period  Weeks    Status  New      PT SHORT TERM GOAL #4   Title  Complete 6 MWT for baseline and assist with exercise prescription    Time  4    Period  Weeks    Status  New        PT Long Term Goals - 05/19/18 2211      PT LONG TERM GOAL #1   Title  Patient will be independent with updated HEP and feel confident with transition to exercising at gym (in his ILF) Target all LTGs 07/18/18    Time  8    Period  Weeks    Status  New      PT LONG TERM GOAL #2   Title  Patient will improve FGA to >= 19/30    Time  8    Period  Weeks    Status  New      PT LONG TERM GOAL #3   Title  Patient will incr gait velocity to >=2.1 ft/sec for improved safety with community ambulation    Time  8    Period  Weeks    Status  New      PT LONG TERM GOAL #4   Title  Patient will improve 6MWT by 100 ft compared to baseline     Time  8    Period  Weeks    Status  New            Plan - 06/06/18 1700    Clinical Impression Statement  Session focused on standing PWR! exercises and updating handout  in language that makes sense to pt for him to complete as part of HEP. Of note, he had previously been issued standing PWR! as part of his HEP and he had no recollection of this. Patient required cues to slow down as he tends to move quickly through the positions. Remainder of session focused on pre-gait and gait training with pt demonstrating carryover of heelstrike and incr step length. Updated HEP language for counter exercise stepping forward and backward with  one leg with other leg stationary. Should ask pt next visit to bring in his HEP handouts and provide a folder to help him organize what he should be doing. Perhaps also make an exercise grid with exercises planned out for him.     Rehab Potential  Good    Clinical Impairments Affecting Rehab Potential  chronic back pain, Parkinsonism per Neuro consult (non-medicated)    PT Frequency  2x / week    PT Duration  8 weeks    PT Treatment/Interventions  ADLs/Self Care Home Management;Aquatic Therapy;DME Instruction;Moist Heat;Gait training;Functional mobility training;Therapeutic activities;Therapeutic exercise;Balance training;Patient/family education;Neuromuscular re-education;Manual techniques;Vestibular;Passive range of motion    PT Next Visit Plan  Warm-up on Nustep (allow pt to set up as he does at his ILF); **Ask pt to bring in all his HEP handouts so we can make a folder for him--he was unaware 1/24 that standing PWR! had already been given to him for HEP and reissued this;  Pt wants to again practice modified supine PWR moves--on blue wedge with pillows and knees bent (be mindful of back pain).  Gait working on step length.    PT Home Exercise Plan   Access Code: [email or web address] ;PWR! Moves in Standing    Consulted and Agree with Plan of Care  Patient       Patient will benefit from skilled therapeutic intervention in order to improve the following deficits and impairments:  Abnormal gait, Decreased activity tolerance, Decreased balance, Decreased endurance, Decreased knowledge of use of DME, Decreased mobility, Decreased strength, Postural dysfunction, Pain  Visit Diagnosis: Abnormal posture  Unsteadiness on feet  Other abnormalities of gait and mobility  Muscle weakness (generalized)     Problem List Patient Active Problem List   Diagnosis Date Noted  . Therapeutic drug monitoring 06/10/2017  . Partial symptomatic epilepsy with complex partial seizures, not intractable, without status  epilepticus ([place]) 12/05/2016  . Syncope 07/29/2016  . Unresponsiveness 07/28/2016  . BPH (benign prostatic hyperplasia) 07/28/2016  . Dyslipidemia 07/28/2016  . Anxiety   . History of prostate cancer   . TIA (transient ischemic attack) 07/12/2016    Rexanne Mano, PT 06/07/2018, 8:13 AM  [facility] [address] [place], [place], [address] Phone: [phone]   Fax:  [phone]  Name: Kenneth Bowen MRN: [id number] Date of Birth: 1930/07/28

## 2018-06-07 NOTE — Patient Instructions (Signed)
  Re-issued standing PWR! Exercises for HEP (pt was not aware he had been given these to do as part of HEP). With pt's assistance, added additional comments to help him with his technique. Still NOT doing Twist due to h/o back surgery   Access Code: [email or web address]  URL: [email or web address]  Date: 06/07/2018  Prepared by: Barry Brunner   Exercises  Forward and Backward Weight Shift with Stepping at [employer] - 30 reps - 2x daily - 7x weekly  Heel rises with counter support - 15 reps - 2x daily - 7x weekly  Toe Raises with Counter Support - 15 reps - 2x daily - 7x weekly  Standing March with Counter Support - 15 reps - 2x daily - 7x weekly

## 2018-06-10 ENCOUNTER — Institutional Professional Consult (permissible substitution): Payer: Medicare Other | Admitting: Neurology

## 2018-06-10 ENCOUNTER — Encounter

## 2018-06-11 ENCOUNTER — Ambulatory Visit: Payer: Medicare Other | Admitting: Physical Therapy

## 2018-06-13 ENCOUNTER — Encounter: Payer: Self-pay | Admitting: Physical Therapy

## 2018-06-13 ENCOUNTER — Ambulatory Visit: Payer: Medicare Other | Admitting: Physical Therapy

## 2018-06-13 DIAGNOSIS — M6281 Muscle weakness (generalized): Secondary | ICD-10-CM

## 2018-06-13 DIAGNOSIS — R29818 Other symptoms and signs involving the nervous system: Secondary | ICD-10-CM

## 2018-06-13 DIAGNOSIS — R293 Abnormal posture: Secondary | ICD-10-CM

## 2018-06-13 DIAGNOSIS — R2681 Unsteadiness on feet: Secondary | ICD-10-CM

## 2018-06-13 DIAGNOSIS — R2689 Other abnormalities of gait and mobility: Secondary | ICD-10-CM

## 2018-06-13 NOTE — Therapy (Signed)
[facility] [address] [place], [place], [address] Phone: [phone]   Fax:  [phone]  Physical Therapy Treatment  Patient Details  Name: Kenneth Bowen MRN: [id number] Date of Birth: 09/07/1930 Referring Provider (PT): Garvin Fila, MD   Encounter Date: 06/13/2018  PT End of Session - 06/13/18 1417    Visit Number  7    Number of Visits  17    Date for PT Re-Evaluation  06/18/18    Authorization Type  Medicare A & B; [employer]    Authorization Time Period  05/19/18 to 07/18/18    PT Start Time  1319    PT Stop Time  1400    PT Time Calculation (min)  41 min    Activity Tolerance  Patient tolerated treatment well    Behavior During Therapy  [facility] for tasks assessed/performed       Past Medical History:  Diagnosis Date  . Colon polyps   . Diverticulosis   . Prostate cancer [facility] 2015   radiation finished Nov 2015  . Seizures ([place]) 07/2016    Past Surgical History:  Procedure Laterality Date  . colonscopy    . INGUINAL HERNIA REPAIR    . KNEE SURGERY    . PROSTATE BIOPSY      There were no vitals filed for this visit.  Subjective Assessment - 06/13/18 1329    Subjective  Not sure if soreness is getting better or not.  Has been moving past several days to another apartment.  "I might just have been ignoring it because I've been constantly walking for 3 days."    Pertinent History  PMH-prostate Ca, March 2018 seizures, lumbar compression fractures, restless legs and parkinsonian variant    Limitations  Walking;Standing;Sitting    How long can you stand comfortably?  not as bad as walking    How long can you walk comfortably?  grocery store distances starts to have pain    Diagnostic tests  MRI brain normal age related changes    Patient Stated Goals  Wants to get a program of exercises and learn how to use equipment at [facility]     Currently in Pain?  Yes    Pain Score  2     Pain Location  Pelvis     Pain Orientation  Right;Posterior;Upper    Pain Descriptors / Indicators  Aching    Pain Type  Chronic pain    Pain Radiating Towards  right buttock    Pain Onset  More than a month ago    Pain Frequency  Intermittent    Aggravating Factors   unsure    Pain Relieving Factors  unsure         [facility] Adult PT Treatment/Exercise - 06/13/18 0001      Bed Mobility   Bed Mobility  Sit to Sidelying Left;Left Sidelying to Sit    Left Sidelying to Sit  Supervision/Verbal cueing;Other (comment)   Provided proper technique in patient instructions   Sit to Sidelying Left  Supervision/Verbal cueing      Transfers   Transfers  Sit to Stand;Stand to Sit    Sit to Stand  6: Modified independent (Device/Increase time);With upper extremity assist;From chair/3-in-1;With armrests    Stand to Sit  6: Modified independent (Device/Increase time)      Knee/Hip Exercises: Aerobic   Nustep  Workload 4 all 4 extremities x 8 minutes      Pt reports he got a  new bed where head and feet can elevate.  Reports difficulty getting into/out of bed and maneuvering covers.  Pt was previously trying to swing legs into bed into a long sitting position while managing covers at same time.  Discussed with pt how this technique puts increased pressure on his back and hip.  Educated and practiced proper technique.            PWR [facility]) - 06/13/18 1413    PWR! exercises  Moves in supine    PWR! Up  x 20    PWR! Rock  x 20 x 2 sets    PWR! Step  x 20    Comments  In Supine-modified rock to accommodate back/hip issues      Pt sleeps on numerous pillows to create a "wedge"; placed on blue wedge with 2 pillows under head, one under back length-wise for pt to be able to achieve supine; very gentle (limited ROM, rolling) for PWR! Rock to begin to get some rotation in spine; pt continues to tolerate well with no incr pain.    PT Education - 06/13/18 1416    Education Details  Bed mobility, moving covers out of way  before getting into/out of bed, bringing HEP on next visit so we can organize in folder and provide checklist for exercises    Person(s) Educated  Patient    Methods  Explanation;Demonstration;Verbal cues;Handout    Comprehension  Verbalized understanding;Returned demonstration       PT Short Term Goals - 05/19/18 2204      PT SHORT TERM GOAL #1   Title  Patient will be independent with HEP (incorporating use of gym equipment at his ILF, as appropriate) Target all STGs 06/18/18    Time  4    Period  Weeks    Status  New      PT SHORT TERM GOAL #2   Title  Improve FGA to >=16/30    Baseline  13/30    Time  4    Period  Weeks    Status  New      PT SHORT TERM GOAL #3   Title  Improve gait velocity to >=1.81 ft/sec with LRAD    Time  4    Period  Weeks    Status  New      PT SHORT TERM GOAL #4   Title  Complete 6 MWT for baseline and assist with exercise prescription    Time  4    Period  Weeks    Status  New        PT Long Term Goals - 05/19/18 2211      PT LONG TERM GOAL #1   Title  Patient will be independent with updated HEP and feel confident with transition to exercising at gym (in his ILF) Target all LTGs 07/18/18    Time  8    Period  Weeks    Status  New      PT LONG TERM GOAL #2   Title  Patient will improve FGA to >= 19/30    Time  8    Period  Weeks    Status  New      PT LONG TERM GOAL #3   Title  Patient will incr gait velocity to >=2.1 ft/sec for improved safety with community ambulation    Time  8    Period  Weeks    Status  New      PT LONG TERM  GOAL #4   Title  Patient will improve 6MWT by 100 ft compared to baseline     Time  8    Period  Weeks    Status  New            Plan - 06/13/18 1417    Clinical Impression Statement  Session focused on endurance, PWR! supine and bed mobility.  Pt reporting difficulty with bed mobility at home including getting into/out of bed.  Pt able to perform independently at end of session with good  technique.  Discussed having pt bring his exercise handouts to next session so we can organize and possibly create exercise grid.  Continue PT per POC.    Rehab Potential  Good    Clinical Impairments Affecting Rehab Potential  chronic back pain, Parkinsonism per Neuro consult (non-medicated)    PT Frequency  2x / week    PT Duration  8 weeks    PT Treatment/Interventions  ADLs/Self Care Home Management;Aquatic Therapy;DME Instruction;Moist Heat;Gait training;Functional mobility training;Therapeutic activities;Therapeutic exercise;Balance training;Patient/family education;Neuromuscular re-education;Manual techniques;Vestibular;Passive range of motion    PT Next Visit Plan  Warm-up on Nustep (allow pt to set up as he does at his ILF);  Ask pt if he brought in all his HEP handouts .  If so make a folder for him.  See how bed mobility went since last visit.  Gait working on step length.    PT Home Exercise Plan   Access Code: [email or web address] ;PWR! Moves in Standing    Consulted and Agree with Plan of Care  Patient       Patient will benefit from skilled therapeutic intervention in order to improve the following deficits and impairments:  Abnormal gait, Decreased activity tolerance, Decreased balance, Decreased endurance, Decreased knowledge of use of DME, Decreased mobility, Decreased strength, Postural dysfunction, Pain  Visit Diagnosis: Abnormal posture  Unsteadiness on feet  Other abnormalities of gait and mobility  Muscle weakness (generalized)  Other symptoms and signs involving the nervous system     Problem List Patient Active Problem List   Diagnosis Date Noted  . Therapeutic drug monitoring 06/10/2017  . Partial symptomatic epilepsy with complex partial seizures, not intractable, without status epilepticus ([place]) 12/05/2016  . Syncope 07/29/2016  . Unresponsiveness 07/28/2016  . BPH (benign prostatic hyperplasia) 07/28/2016  . Dyslipidemia 07/28/2016  . Anxiety   . History of  prostate cancer   . TIA (transient ischemic attack) 07/12/2016   Narda Bonds, PTA [place] 06/13/18 2:23 PM Phone: [phone] Fax: [facility] [address] [place], [place], [address] Phone: [phone]   Fax:  [phone]  Name: Kenneth Bowen MRN: [id number] Date of Birth: [birth year]

## 2018-06-13 NOTE — Patient Instructions (Addendum)
Getting Into / Out of Bed    Lower self to lie down on one side by raising legs and lowering head at the same time. Use arms to assist moving without twisting. Bend both knees to roll onto back if desired. To sit up, start from lying on side, and use same move-ments in reverse. Keep trunk aligned with legs.   Copyright  VHI. All rights reserved.  BED MOBILITY: Side-Lying to Sit    Lie on side, move legs to edge of bed. Push down with both hands while moving legs off bed to reach sitting position. ___ reps per set, ___ sets per day, ___ days per week   Copyright  VHI. All rights reserved.

## 2018-06-17 ENCOUNTER — Ambulatory Visit: Payer: Medicare Other | Attending: Neurology | Admitting: Physical Therapy

## 2018-06-17 ENCOUNTER — Encounter: Payer: Self-pay | Admitting: Physical Therapy

## 2018-06-17 DIAGNOSIS — R293 Abnormal posture: Secondary | ICD-10-CM | POA: Insufficient documentation

## 2018-06-17 DIAGNOSIS — R2681 Unsteadiness on feet: Secondary | ICD-10-CM | POA: Insufficient documentation

## 2018-06-17 DIAGNOSIS — R2689 Other abnormalities of gait and mobility: Secondary | ICD-10-CM | POA: Insufficient documentation

## 2018-06-17 DIAGNOSIS — M6281 Muscle weakness (generalized): Secondary | ICD-10-CM | POA: Diagnosis present

## 2018-06-17 DIAGNOSIS — R29818 Other symptoms and signs involving the nervous system: Secondary | ICD-10-CM | POA: Diagnosis present

## 2018-06-17 NOTE — Therapy (Signed)
[facility] [address] [place], [place], [address] Phone: [phone]   Fax:  [phone]  Physical Therapy Treatment  Patient Details  Name: Kenneth Bowen MRN: [id number] Date of Birth: 06/06/1930 Referring Provider (PT): Garvin Fila, MD   Encounter Date: 06/17/2018  PT End of Session - 06/17/18 1456    Visit Number  8    Number of Visits  17    Date for PT Re-Evaluation  06/18/18    Authorization Type  Medicare A & B; [employer]    Authorization Time Period  05/19/18 to 07/18/18    PT Start Time  1452    PT Stop Time  1535    PT Time Calculation (min)  43 min    Equipment Utilized During Treatment  Gait belt    Activity Tolerance  Patient tolerated treatment well    Behavior During Therapy  [facility] for tasks assessed/performed       Past Medical History:  Diagnosis Date  . Colon polyps   . Diverticulosis   . Prostate cancer [facility]) 2015   radiation finished Nov 2015  . Seizures ([place]) 07/2016    Past Surgical History:  Procedure Laterality Date  . colonscopy    . INGUINAL HERNIA REPAIR    . KNEE SURGERY    . PROSTATE BIOPSY      There were no vitals filed for this visit.  Subjective Assessment - 06/17/18 1455    Subjective  Been busy with their move to a 2 bedroom place down the hall.     Pertinent History  PMH-prostate Ca, March 2018 seizures, lumbar compression fractures, restless legs and parkinsonian variant    Limitations  Walking;Standing;Sitting    How long can you stand comfortably?  not as bad as walking    How long can you walk comfortably?  grocery store distances starts to have pain    Diagnostic tests  MRI brain normal age related changes    Patient Stated Goals  Wants to get a program of exercises and learn how to use equipment at [facility]     Currently in Pain?  No/denies         [facility] PT Assessment - 06/17/18 1504      6 Minute Walk- Baseline   6 Minute Walk- Baseline  yes    BP  (mmHg)  108/70    HR (bpm)  60    02 Sat (%RA)  95 %    Modified Borg Scale for Dyspnea  0- Nothing at all    Perceived Rate of Exertion (Borg)  7- Very, very light      6 Minute walk- Post Test   6 Minute Walk Post Test  yes    BP (mmHg)  142/74    HR (bpm)  69    02 Sat (%RA)  99 %    Modified Borg Scale for Dyspnea  1- Very mild shortness of breath    Perceived Rate of Exertion (Borg)  13- Somewhat hard      6 minute walk test results    Aerobic Endurance Distance Walked  1014    Endurance additional comments  improved 182 ft from last measure      Functional Gait  Assessment   Gait Level Surface  Walks 20 ft, slow speed, abnormal gait pattern, evidence for imbalance or deviates 10-15 in outside of the 12 in walkway width. Requires more than 7 sec to ambulate 20 ft.  8.32   Change in Gait Speed  Able to smoothly change walking speed without loss of balance or gait deviation. Deviate no more than 6 in outside of the 12 in walkway width.    Gait with Horizontal Head Turns  Performs head turns smoothly with slight change in gait velocity (eg, minor disruption to smooth gait path), deviates 6-10 in outside 12 in walkway width, or uses an assistive device.    Gait with Vertical Head Turns  Performs head turns with no change in gait. Deviates no more than 6 in outside 12 in walkway width.    Gait and Pivot Turn  Pivot turns safely within 3 sec and stops quickly with no loss of balance.    Step Over Obstacle  Is able to step over 2 stacked shoe boxes taped together (9 in total height) without changing gait speed. No evidence of imbalance.    Gait with Narrow Base of Support  Ambulates less than 4 steps heel to toe or cannot perform without assistance.    Gait with Eyes Closed  Walks 20 ft, no assistive devices, good speed, no evidence of imbalance, normal gait pattern, deviates no more than 6 in outside 12 in walkway width. Ambulates 20 ft in less than 7 sec.    Ambulating Backwards  Walks  20 ft, slow speed, abnormal gait pattern, evidence for imbalance, deviates 10-15 in outside 12 in walkway width.    Steps  Two feet to a stair, must use rail.   descend due to knee pain   Total Score  20    FGA comment:  completed test initially with cane and then without; <23 indicates incr fall risk                   [facility] Adult PT Treatment/Exercise - 06/17/18 0001      Ambulation/Gait   Ambulation/Gait Assistance  6: Modified independent (Device/Increase time)    Ambulation Distance (Feet)  1014 Feet   see 6MWT   Assistive device  Straight cane    Gait Pattern  Poor foot clearance - left;Poor foot clearance - right;Step-through pattern;Decreased arm swing - left;Decreased stride length    Ambulation Surface  Level;Indoor    Gait velocity  20 ft/8.32 sec=2.40 ft/sec             PT Education - 06/17/18 2112    Education Details  results of 4 week goals and progress made    Person(s) Educated  Patient    Methods  Explanation    Comprehension  Verbalized understanding       PT Short Term Goals - 06/17/18 2113      PT SHORT TERM GOAL #1   Title  Patient will be independent with HEP (incorporating use of gym equipment at his ILF, as appropriate) Target all STGs 06/18/18    Baseline  Patient independent with exercises, however not completing consistently due to recent move    Time  4    Period  Weeks    Status  Partially Met      PT SHORT TERM GOAL #2   Title  Improve FGA to >=16/30    Baseline  13/30; 06/17/18   20/30    Time  4    Period  Weeks    Status  Achieved      PT SHORT TERM GOAL #3   Title  Improve gait velocity to >=1.81 ft/sec with LRAD    Baseline  06/17/18  2.40 ft/sec  Time  4    Period  Weeks    Status  Achieved      PT SHORT TERM GOAL #4   Title  Complete 6 MWT for baseline and assist with exercise prescription    Baseline  completed 05/23/18    Time  4    Period  Weeks    Status  Achieved        PT Long Term Goals - 06/17/18  2114      PT LONG TERM GOAL #1   Title  Patient will be independent with updated HEP and feel confident with transition to exercising at gym (in his ILF) Target all LTGs 07/18/18    Time  8    Period  Weeks    Status  New      PT LONG TERM GOAL #2   Title  Patient will improve FGA to >= 23/30    Time  8    Period  Weeks    Status  Revised   based on progress at 4 weeks     PT LONG TERM GOAL #3   Title  Patient will incr gait velocity to >=2.62 ft/sec for improved safety with community ambulation    Time  8    Period  Weeks    Status  Revised   updated due to progress at 4 wks     PT LONG TERM GOAL #4   Title  Patient will improve 6MWT by 100 ft compared to baseline     Baseline  06/17/18 incr 182 ft    Time  8    Period  Weeks    Status  Achieved      PT LONG TERM GOAL #5   Title  Patient will improve 6MWT to >=1200 ft    Time  8    Period  Weeks    Status  New              Patient will benefit from skilled therapeutic intervention in order to improve the following deficits and impairments:     Visit Diagnosis: Unsteadiness on feet  Other abnormalities of gait and mobility     Problem List Patient Active Problem List   Diagnosis Date Noted  . Therapeutic drug monitoring 06/10/2017  . Partial symptomatic epilepsy with complex partial seizures, not intractable, without status epilepticus ([place]) 12/05/2016  . Syncope 07/29/2016  . Unresponsiveness 07/28/2016  . BPH (benign prostatic hyperplasia) 07/28/2016  . Dyslipidemia 07/28/2016  . Anxiety   . History of prostate cancer   . TIA (transient ischemic attack) 07/12/2016    Rexanne Mano, PT 06/17/2018, 9:18 PM  [facility] [address] [place], [place], [address] Phone: [phone]   Fax:  [phone]  Name: Kenneth Bowen MRN: [id number] Date of Birth: [birth year]

## 2018-06-20 ENCOUNTER — Ambulatory Visit: Payer: Medicare Other | Admitting: Physical Therapy

## 2018-06-20 ENCOUNTER — Encounter: Payer: Self-pay | Admitting: Physical Therapy

## 2018-06-20 DIAGNOSIS — R29818 Other symptoms and signs involving the nervous system: Secondary | ICD-10-CM

## 2018-06-20 DIAGNOSIS — R293 Abnormal posture: Secondary | ICD-10-CM

## 2018-06-20 DIAGNOSIS — R2681 Unsteadiness on feet: Secondary | ICD-10-CM | POA: Diagnosis not present

## 2018-06-20 DIAGNOSIS — M6281 Muscle weakness (generalized): Secondary | ICD-10-CM

## 2018-06-21 NOTE — Patient Instructions (Signed)
  Patient provided modified supine PWR! Exercises (doing with knees bent) with limited # reps initially

## 2018-06-21 NOTE — Therapy (Addendum)
[facility] [address] [place], [place], [address] Phone: [phone]   Fax:  [phone]  Physical Therapy Treatment  Patient Details  Name: Kenneth Bowen MRN: [id number] Date of Birth: 26-Jan-1931 Referring Provider (PT): Garvin Fila, MD   Encounter Date: 06/20/2018  PT End of Session - 06/20/18 1312    Visit Number  9   Number of Visits  17    Date for PT Re-Evaluation  07/18/18    Authorization Type  Medicare A & B; [employer]    Authorization Time Period  05/19/18 to 07/18/18    PT Start Time  1315    PT Stop Time  1403    PT Time Calculation (min)  48 min    Activity Tolerance  Patient tolerated treatment well    Behavior During Therapy  [facility] for tasks assessed/performed       Past Medical History:  Diagnosis Date  . Colon polyps   . Diverticulosis   . Prostate cancer [facility]) 2015   radiation finished Nov 2015  . Seizures ([place]) 07/2016    Past Surgical History:  Procedure Laterality Date  . colonscopy    . INGUINAL HERNIA REPAIR    . KNEE SURGERY    . PROSTATE BIOPSY      There were no vitals filed for this visit.  Subjective Assessment - 06/20/18 1316    Subjective  Still busy with the move and getting settled in. Have done a few exercises but not many.     Pertinent History  PMH-prostate Ca, March 2018 seizures, lumbar compression fractures, restless legs and parkinsonian variant    Limitations  Walking;Standing;Sitting    How long can you stand comfortably?  not as bad as walking    How long can you walk comfortably?  grocery store distances starts to have pain    Diagnostic tests  MRI brain normal age related changes    Patient Stated Goals  Wants to get a program of exercises and learn how to use equipment at [facility]     Currently in Pain?  No/denies                       [facility] Adult PT Treatment/Exercise - 06/20/18 1321      Knee/Hip Exercises: Aerobic   Nustep   Workload 4 all 4 extremities x 4 minutes        PWR [facility]) - 06/20/18 1336    PWR! exercises  Moves in supine;Moves in standing    PWR! Up  20   vc for relaxing UEs and not pressing elbows down   PWR! Rock  20   first 15 with both knees bent; 5 with alternating 1 knee ben   PWR! Twist  10   both knees bent   PWR! Step  10   high knees with stepping, very low lift of hips   Comments  upon return to sitting EOB, back pain was up to 3 or 4; sat in chair with back supported and down to 1/10 when returned to standing      Increase time, verbal and tactile cues to improve technique and monitor his back pain/tolerance for activity    PT Education - 06/21/18 0838    Education Details  addition of supine PWR to HEP; modifications to PWR supine    Person(s) Educated  Patient    Methods  Explanation;Demonstration;Tactile cues;Verbal cues;Handout    Comprehension  Tactile cues  required;Verbal cues required;Returned demonstration;Verbalized understanding;Need further instruction       PT Short Term Goals - 06/17/18 2113      PT SHORT TERM GOAL #1   Title  Patient will be independent with HEP (incorporating use of gym equipment at his ILF, as appropriate) Target all STGs 06/18/18    Baseline  Patient independent with exercises, however not completing consistently due to recent move    Time  4    Period  Weeks    Status  Partially Met      PT SHORT TERM GOAL #2   Title  Improve FGA to >=16/30    Baseline  13/30; 06/17/18   20/30    Time  4    Period  Weeks    Status  Achieved      PT SHORT TERM GOAL #3   Title  Improve gait velocity to >=1.81 ft/sec with LRAD    Baseline  06/17/18  2.40 ft/sec    Time  4    Period  Weeks    Status  Achieved      PT SHORT TERM GOAL #4   Title  Complete 6 MWT for baseline and assist with exercise prescription    Baseline  completed 05/23/18    Time  4    Period  Weeks    Status  Achieved        PT Long Term Goals - 06/17/18 2114      PT LONG  TERM GOAL #1   Title  Patient will be independent with updated HEP and feel confident with transition to exercising at gym (in his ILF) Target all LTGs 07/18/18    Time  8    Period  Weeks    Status  New      PT LONG TERM GOAL #2   Title  Patient will improve FGA to >= 23/30    Time  8    Period  Weeks    Status  Revised   based on progress at 4 weeks     PT LONG TERM GOAL #3   Title  Patient will incr gait velocity to >=2.62 ft/sec for improved safety with community ambulation    Time  8    Period  Weeks    Status  Revised   updated due to progress at 4 wks     PT LONG TERM GOAL #4   Title  Patient will improve 6MWT by 100 ft compared to baseline     Baseline  06/17/18 incr 182 ft    Time  8    Period  Weeks    Status  Achieved      PT LONG TERM GOAL #5   Title  Patient will improve 6MWT to >=1200 ft    Time  8    Period  Weeks    Status  New              Patient will benefit from skilled therapeutic intervention in order to improve the following deficits and impairments:     Visit Diagnosis: Abnormal posture  Muscle weakness (generalized)  Other symptoms and signs involving the nervous system    06/20/18 2023  Plan  Clinical Impression Statement Session focused on education of PWR! exercises in supine vs hooklying to minimize strain on his low back (h/o compression fractures). He was able to lie completely flat (no wedge) with 2 pillows under his shoulders/neck. Patient tolerating well and progressed to only one  leg flexed for [employer] and PWR Twist and able to then focus on pressing foot down into mat to assist with rolling (not just passively allowing knees to roll over as with hooklying). Extensive time spent on technique and on reviewing updated handout for HEP. Patient is progressing well.   Pt will benefit from skilled therapeutic intervention in order to improve on the following deficits Abnormal gait;Decreased activity tolerance;Decreased  balance;Decreased endurance;Decreased knowledge of use of DME;Decreased mobility;Decreased strength;Postural dysfunction;Pain  Rehab Potential Good  Clinical Impairments Affecting Rehab Potential chronic back pain, Parkinsonism per Neuro consult (non-medicated)  PT Frequency 2x / week  PT Duration 8 weeks  PT Treatment/Interventions ADLs/Self Care Home Management;Aquatic Therapy;DME Instruction;Moist Heat;Gait training;Functional mobility training;Therapeutic activities;Therapeutic exercise;Balance training;Patient/family education;Neuromuscular re-education;Manual techniques;Vestibular;Passive range of motion  PT Next Visit Plan Warm-up on Nustep (allow pt to set up as he does at his ILF);  See how supine PWR HEP went; revisit standing PWR; Gait working on step length.  PT Home Exercise Plan  Access Code: [email or web address] ;PWR! Moves in [place] and Agree with Plan of Care Patient     Problem List Patient Active Problem List   Diagnosis Date Noted  . Therapeutic drug monitoring 06/10/2017  . Partial symptomatic epilepsy with complex partial seizures, not intractable, without status epilepticus ([place]) 12/05/2016  . Syncope 07/29/2016  . Unresponsiveness 07/28/2016  . BPH (benign prostatic hyperplasia) 07/28/2016  . Dyslipidemia 07/28/2016  . Anxiety   . History of prostate cancer   . TIA (transient ischemic attack) 07/12/2016    Rexanne Mano, PT 06/21/2018, 8:40 AM  [facility] [address] [place], [place], [address] Phone: [phone]   Fax:  [phone]  Name: Kenneth Bowen MRN: [id number] Date of Birth: 04/06/1931

## 2018-06-23 ENCOUNTER — Encounter: Payer: Self-pay | Admitting: Physical Therapy

## 2018-06-23 ENCOUNTER — Ambulatory Visit: Payer: Medicare Other | Admitting: Physical Therapy

## 2018-06-23 DIAGNOSIS — R2681 Unsteadiness on feet: Secondary | ICD-10-CM | POA: Diagnosis not present

## 2018-06-23 DIAGNOSIS — M6281 Muscle weakness (generalized): Secondary | ICD-10-CM

## 2018-06-23 DIAGNOSIS — R29818 Other symptoms and signs involving the nervous system: Secondary | ICD-10-CM

## 2018-06-23 DIAGNOSIS — R293 Abnormal posture: Secondary | ICD-10-CM

## 2018-06-23 NOTE — Therapy (Signed)
[facility] [address] [place], [place], [address] Phone: [phone]   Fax:  [phone]  Physical Therapy Treatment and 10th visit Progress Note  Patient Details  Name: Kenneth Bowen MRN: [id number] Date of Birth: 30-Jul-1930 Referring Provider (PT): Garvin Fila, MD   Encounter Date: 06/23/2018   10th visit progress note covers dates: 05/19/18 to 06/23/18  PT End of Session - 06/23/18 1313    Visit Number  10    Number of Visits  17    Date for PT Re-Evaluation  07/18/18    Authorization Type  Medicare A & B; [employer]    Authorization Time Period  05/19/18 to 07/18/18    PT Start Time  1315    PT Stop Time  1401    PT Time Calculation (min)  46 min    Activity Tolerance  Patient tolerated treatment well    Behavior During Therapy  [facility] for tasks assessed/performed       Past Medical History:  Diagnosis Date  . Colon polyps   . Diverticulosis   . Prostate cancer [facility]) 2015   radiation finished Nov 2015  . Seizures ([place]) 07/2016    Past Surgical History:  Procedure Laterality Date  . colonscopy    . INGUINAL HERNIA REPAIR    . KNEE SURGERY    . PROSTATE BIOPSY      There were no vitals filed for this visit.  Subjective Assessment - 06/23/18 1313    Pertinent History  PMH-prostate Ca, March 2018 seizures, lumbar compression fractures, restless legs and parkinsonian variant    Limitations  Walking;Standing;Sitting    How long can you stand comfortably?  not as bad as walking    How long can you walk comfortably?  grocery store distances starts to have pain    Diagnostic tests  MRI brain normal age related changes    Patient Stated Goals  Wants to get a program of exercises and learn how to use equipment at [facility]     Currently in Pain?  Yes    Pain Score  4     Pain Location  Buttocks    Pain Orientation  Right    Pain Descriptors / Indicators  Aching    Pain Type  Chronic pain    Pain  Radiating Towards  right buttock    Pain Onset  More than a month ago    Pain Frequency  Intermittent    Aggravating Factors   unsure; maybe sleeping on his left side ( he was able to last night for the first time since 2017 fall)                       [facility] Adult PT Treatment/Exercise - 06/23/18 1325      Ambulation/Gait   Ambulation/Gait Assistance  5: Supervision    Ambulation/Gait Assistance Details  vc for incr step length, focus on heelstrike to improve foot clearance, head in neutral over shoulders    Ambulation Distance (Feet)  240 Feet    Assistive device  Straight cane    Gait Pattern  Poor foot clearance - left;Poor foot clearance - right;Step-through pattern;Decreased arm swing - left;Decreased stride length      Knee/Hip Exercises: Aerobic   Nustep  Workload 4 all 4 extremities x 9 minutes        PWR [facility]) - 06/23/18 1333    PWR! exercises  Moves in supine;  PWR! Up  20    PWR! Rock  20    PWR! Twist  20    PWR! Step  10    PWR! Up Moves in standing   20   vc for hip hinge with hips drifting back   PWR! Rock  20    PWR Step  20   1/2 with one hand on counter; 1/2 no hands on counter 1 LOB with min assist to recover; instructed to continue at home with one hand on counter   Comments  standing at counter            PT Short Term Goals - 06/17/18 2113      PT SHORT TERM GOAL #1   Title  Patient will be independent with HEP (incorporating use of gym equipment at his ILF, as appropriate) Target all STGs 06/18/18    Baseline  Patient independent with exercises, however not completing consistently due to recent move    Time  4    Period  Weeks    Status  Partially Met      PT SHORT TERM GOAL #2   Title  Improve FGA to >=16/30    Baseline  13/30; 06/17/18   20/30    Time  4    Period  Weeks    Status  Achieved      PT SHORT TERM GOAL #3   Title  Improve gait velocity to >=1.81 ft/sec with LRAD    Baseline  06/17/18  2.40 ft/sec    Time  4     Period  Weeks    Status  Achieved      PT SHORT TERM GOAL #4   Title  Complete 6 MWT for baseline and assist with exercise prescription    Baseline  completed 05/23/18    Time  4    Period  Weeks    Status  Achieved        PT Long Term Goals - 06/17/18 2114      PT LONG TERM GOAL #1   Title  Patient will be independent with updated HEP and feel confident with transition to exercising at gym (in his ILF) Target all LTGs 07/18/18    Time  8    Period  Weeks    Status  New      PT LONG TERM GOAL #2   Title  Patient will improve FGA to >= 23/30    Time  8    Period  Weeks    Status  Revised   based on progress at 4 weeks     PT LONG TERM GOAL #3   Title  Patient will incr gait velocity to >=2.62 ft/sec for improved safety with community ambulation    Time  8    Period  Weeks    Status  Revised   updated due to progress at 4 wks     PT LONG TERM GOAL #4   Title  Patient will improve 6MWT by 100 ft compared to baseline     Baseline  06/17/18 incr 182 ft    Time  8    Period  Weeks    Status  Achieved      PT LONG TERM GOAL #5   Title  Patient will improve 6MWT to >=1200 ft    Time  8    Period  Weeks    Status  New  Plan - 06/23/18 1857    Clinical Impression Statement  Patient was able to demonstrate both supine PWR! and standing PWR! exercises from HEP with very few cues for improved technique. Began to introduce idea of slow (for stretching) vs faster (for intensity). Pt required vc to maintain instensity. Gait training for improved posture and foot clearance. Patient quickly learning technique for PWR exercises and improving his mobility. Pt notes especially his bed mobiity and ability to sleep on his side for first time since 2017.     Rehab Potential  Good    Clinical Impairments Affecting Rehab Potential  chronic back pain, Parkinsonism per Neuro consult (non-medicated)    PT Frequency  2x / week    PT Duration  8 weeks    PT  Treatment/Interventions  ADLs/Self Care Home Management;Aquatic Therapy;DME Instruction;Moist Heat;Gait training;Functional mobility training;Therapeutic activities;Therapeutic exercise;Balance training;Patient/family education;Neuromuscular re-education;Manual techniques;Vestibular;Passive range of motion    PT Next Visit Plan  Warm-up on Nustep (allow pt to set up as he does at his ILF); Pre-gait working on foot clearance (?heel taps on foam bubbles, stepping over low objects/?cane) Begin to address the machines he has at his ILF and what he should (and should not) do.    PT Home Exercise Plan   Access Code: [email or web address] ;PWR! Moves in Standing, PWR supine--modified to hooklying or one leg extended to protect low back (Up, Rock, Twist, Step)    Consulted and Agree with Plan of Care  Patient       Patient will benefit from skilled therapeutic intervention in order to improve the following deficits and impairments:  Abnormal gait, Decreased activity tolerance, Decreased balance, Decreased endurance, Decreased knowledge of use of DME, Decreased mobility, Decreased strength, Postural dysfunction, Pain  Visit Diagnosis: Abnormal posture  Muscle weakness (generalized)  Other symptoms and signs involving the nervous system     Problem List Patient Active Problem List   Diagnosis Date Noted  . Therapeutic drug monitoring 06/10/2017  . Partial symptomatic epilepsy with complex partial seizures, not intractable, without status epilepticus ([place]) 12/05/2016  . Syncope 07/29/2016  . Unresponsiveness 07/28/2016  . BPH (benign prostatic hyperplasia) 07/28/2016  . Dyslipidemia 07/28/2016  . Anxiety   . History of prostate cancer   . TIA (transient ischemic attack) 07/12/2016    Rexanne Mano, PT 06/23/2018, 7:05 PM  [facility] [address] [place], [place], [address] Phone: [phone]   Fax:  [phone]  Name: Kenneth Bowen MRN: [id number] Date of Birth: 02-17-1931

## 2018-06-27 ENCOUNTER — Encounter: Payer: Self-pay | Admitting: Physical Therapy

## 2018-06-27 ENCOUNTER — Ambulatory Visit: Payer: Medicare Other | Admitting: Physical Therapy

## 2018-06-27 DIAGNOSIS — R2681 Unsteadiness on feet: Secondary | ICD-10-CM | POA: Diagnosis not present

## 2018-06-27 DIAGNOSIS — R293 Abnormal posture: Secondary | ICD-10-CM

## 2018-06-27 DIAGNOSIS — R2689 Other abnormalities of gait and mobility: Secondary | ICD-10-CM

## 2018-06-28 NOTE — Therapy (Signed)
[facility] [address] [place], [place], [address] Phone: [phone]   Fax:  [phone]  Physical Therapy Treatment  Patient Details  Name: Kenneth Bowen MRN: [id number] Date of Birth: 1930/09/18 Referring Provider (PT): Garvin Fila, MD   Encounter Date: 06/27/2018  PT End of Session - 06/27/18 1317    Visit Number  11    Number of Visits  17    Date for PT Re-Evaluation  07/18/18    Authorization Type  Medicare A & B; [employer]    Authorization Time Period  05/19/18 to 07/18/18    PT Start Time  1315    PT Stop Time  1400    PT Time Calculation (min)  45 min    Activity Tolerance  Patient tolerated treatment well    Behavior During Therapy  [facility] for tasks assessed/performed       Past Medical History:  Diagnosis Date  . Colon polyps   . Diverticulosis   . Prostate cancer [facility]) 2015   radiation finished Nov 2015  . Seizures ([place]) 07/2016    Past Surgical History:  Procedure Laterality Date  . colonscopy    . INGUINAL HERNIA REPAIR    . KNEE SURGERY    . PROSTATE BIOPSY      There were no vitals filed for this visit.  Subjective Assessment - 06/27/18 1318    Subjective  Rt hip/buttock pain is getting worse. Not sure that it's the exercises. He's been walking alot more since moving into his new place. Not sure if that's the cause.    Pertinent History  PMH-prostate Ca, March 2018 seizures, lumbar compression fractures, restless legs and parkinsonian variant    Limitations  Walking;Standing;Sitting    How long can you stand comfortably?  not as bad as walking    How long can you walk comfortably?  grocery store distances starts to have pain    Diagnostic tests  MRI brain normal age related changes    Patient Stated Goals  Wants to get a program of exercises and learn how to use equipment at [facility]     Currently in Pain?  Yes    Pain Score  2     Pain Location  Hip    Pain Orientation   Right;Posterior;Proximal    Pain Descriptors / Indicators  Nagging    Pain Type  Chronic pain    Pain Onset  More than a month ago    Pain Frequency  Intermittent    Aggravating Factors   walking    Pain Relieving Factors  rest, pain medicine    Effect of Pain on Daily Activities  pt observed to be sitting with majority of weight on left side, left lateral flexion; walking with shortened steps       Treatment- In sidelying, pt with point tenderness just superior to posterior iliac crest, 3-4" rt of his spine--i.e. thoracolumbar fascia, however no palpable trigger point. Unable to reproduce pain with resistance of hip extension, hip abduction, back extension, spinal rt rotation, or "lat pull down" motion. Provided myofasical release and soft tissue mobilization to area and upon standing, pt noted pain was gone. Patient tolerated gait training x 230 ft before mild pain returned (focus on weight shift over RLE and incr step length with heel to toe progression over each foot). Use of cane or RW did not help reduce pain. Educated pt in use of tennis ball to provide massage or direct  pressure over area (sitting with ball between him and the chair). Educated in supine single knee to chest and piriformis stretch (knee to opposite shoulder) with pt reporting these stretches do effect the painful area.                 [facility] Adult PT Treatment/Exercise - 06/28/18 0001      Knee/Hip Exercises: Stretches   Piriformis Stretch  Right;2 reps;30 seconds    Piriformis Stretch Limitations  supine rt knee to left shoulder    Other Knee/Hip Stretches  single knee to chest RLE 2 reps x 30 sec      Manual Therapy   Manual Therapy  Soft tissue mobilization;Myofascial release    Manual therapy comments  gradually incr pressure over area of point tenderness with no release of fascial or muscle tension noted;     Soft tissue mobilization  light to moderate massage over rt thoracolumbar area with pt  reporting pain relief    Myofascial Release  over rt thoracolumbar fascia with lengthening palpable during release and pt reporting decreased pain             PT Education - 06/27/18 1700    Education Details  use of ice for pain (he's tried heat with no success); use a tennis ball (provided) when seated against the painful spot to provide pressure/muscle relaxation; stretches to relieve pain (massage seemed most helpful today, so start with just massage. If still not getting pain relief, then add stretches to routine)   Person(s) Educated  Patient    Methods  Explanation;Demonstration;Verbal cues    Comprehension  Verbalized understanding       PT Short Term Goals - 06/17/18 2113      PT SHORT TERM GOAL #1   Title  Patient will be independent with HEP (incorporating use of gym equipment at his ILF, as appropriate) Target all STGs 06/18/18    Baseline  Patient independent with exercises, however not completing consistently due to recent move    Time  4    Period  Weeks    Status  Partially Met      PT SHORT TERM GOAL #2   Title  Improve FGA to >=16/30    Baseline  13/30; 06/17/18   20/30    Time  4    Period  Weeks    Status  Achieved      PT SHORT TERM GOAL #3   Title  Improve gait velocity to >=1.81 ft/sec with LRAD    Baseline  06/17/18  2.40 ft/sec    Time  4    Period  Weeks    Status  Achieved      PT SHORT TERM GOAL #4   Title  Complete 6 MWT for baseline and assist with exercise prescription    Baseline  completed 05/23/18    Time  4    Period  Weeks    Status  Achieved        PT Long Term Goals - 06/17/18 2114      PT LONG TERM GOAL #1   Title  Patient will be independent with updated HEP and feel confident with transition to exercising at gym (in his ILF) Target all LTGs 07/18/18    Time  8    Period  Weeks    Status  New      PT LONG TERM GOAL #2   Title  Patient will improve FGA to >= 23/30  Time  8    Period  Weeks    Status  Revised   based  on progress at 4 weeks     PT LONG TERM GOAL #3   Title  Patient will incr gait velocity to >=2.62 ft/sec for improved safety with community ambulation    Time  8    Period  Weeks    Status  Revised   updated due to progress at 4 wks     PT LONG TERM GOAL #4   Title  Patient will improve 6MWT by 100 ft compared to baseline     Baseline  06/17/18 incr 182 ft    Time  8    Period  Weeks    Status  Achieved      PT LONG TERM GOAL #5   Title  Patient will improve 6MWT to >=1200 ft    Time  8    Period  Weeks    Status  New            Plan - 06/28/18 0730    Clinical Impression Statement  Patient arrived today with notable changes to his gait pattern with reports of increased rt "hip" pain. (Pain is actually just superior to posterior iliac crest, 3-4" rt of his spine--i.e. thoracolumbar fascia). Pt with shortened step length bil, decreased wt-shift onto RLE, and in sitting weight shifts off of rt ischium with left lateral lean. Noted CT of pelvis 03/2018 negative. Completed musculoskeletal assessment and treatment (see note for details) with pain decreased and pt then able to tolerate gait training. Patient educated in stretches and self-massage techniques to continue to address this pain to allow pt to fully participate in PT for his gait abnormality.     Rehab Potential  Good    Clinical Impairments Affecting Rehab Potential  chronic back pain, Parkinsonism per Neuro consult (non-medicated)    PT Frequency  2x / week    PT Duration  8 weeks    PT Treatment/Interventions  ADLs/Self Care Home Management;Aquatic Therapy;DME Instruction;Moist Heat;Gait training;Functional mobility training;Therapeutic activities;Therapeutic exercise;Balance training;Patient/family education;Neuromuscular re-education;Manual techniques;Vestibular;Passive range of motion    PT Next Visit Plan  How has he done with rt "hip" (low back) pain? Did he use massage and/or stretches since last visit? Warm-up on  Nustep (allow pt to set up as he does at his ILF); Pre-gait working on foot clearance (?heel taps on foam bubbles, stepping over low objects--canes?) Begin to address the machines he has at his ILF and what he should (and should not) do.    PT Home Exercise Plan   Access Code: [email or web address] ;PWR! Moves in Standing, PWR supine--modified to hooklying or one leg extended to protect low back (Up, Rock, Twist, Step)    Consulted and Agree with Plan of Care  Patient       Patient will benefit from skilled therapeutic intervention in order to improve the following deficits and impairments:  Abnormal gait, Decreased activity tolerance, Decreased balance, Decreased endurance, Decreased knowledge of use of DME, Decreased mobility, Decreased strength, Postural dysfunction, Pain  Visit Diagnosis: Abnormal posture  Other abnormalities of gait and mobility     Problem List Patient Active Problem List   Diagnosis Date Noted  . Therapeutic drug monitoring 06/10/2017  . Partial symptomatic epilepsy with complex partial seizures, not intractable, without status epilepticus ([place]) 12/05/2016  . Syncope 07/29/2016  . Unresponsiveness 07/28/2016  . BPH (benign prostatic hyperplasia) 07/28/2016  . Dyslipidemia 07/28/2016  . Anxiety   .  History of prostate cancer   . TIA (transient ischemic attack) 07/12/2016    Rexanne Mano, PT 06/28/2018, 8:31 AM  [facility] [address] [place], [place], [address] Phone: [phone]   Fax:  [phone]  Name: Kenneth Bowen MRN: [id number] Date of Birth: 1931/02/05

## 2018-06-30 ENCOUNTER — Encounter: Payer: Self-pay | Admitting: Physical Therapy

## 2018-06-30 ENCOUNTER — Ambulatory Visit: Payer: Medicare Other | Admitting: Physical Therapy

## 2018-06-30 DIAGNOSIS — R293 Abnormal posture: Secondary | ICD-10-CM

## 2018-06-30 DIAGNOSIS — R2681 Unsteadiness on feet: Secondary | ICD-10-CM | POA: Diagnosis not present

## 2018-06-30 DIAGNOSIS — R2689 Other abnormalities of gait and mobility: Secondary | ICD-10-CM

## 2018-06-30 DIAGNOSIS — M6281 Muscle weakness (generalized): Secondary | ICD-10-CM

## 2018-06-30 NOTE — Therapy (Signed)
[facility] [address] [place], [place], [address] Phone: [phone]   Fax:  [phone]  Physical Therapy Treatment  Patient Details  Name: Kenneth Bowen MRN: [id number] Date of Birth: 13-Jul-1930 Referring Provider (PT): Garvin Fila, MD   Encounter Date: 06/30/2018  PT End of Session - 06/30/18 1320    Visit Number  12    Number of Visits  17    Date for PT Re-Evaluation  07/18/18    Authorization Type  Medicare A & B; [employer]    Authorization Time Period  05/19/18 to 07/18/18    PT Start Time  1315    PT Stop Time  1404    PT Time Calculation (min)  49 min    Activity Tolerance  Patient tolerated treatment well    Behavior During Therapy  [facility] for tasks assessed/performed       Past Medical History:  Diagnosis Date  . Colon polyps   . Diverticulosis   . Prostate cancer [facility]) 2015   radiation finished Nov 2015  . Seizures ([place]) 07/2016    Past Surgical History:  Procedure Laterality Date  . colonscopy    . INGUINAL HERNIA REPAIR    . KNEE SURGERY    . PROSTATE BIOPSY      There were no vitals filed for this visit.  Subjective Assessment - 06/30/18 1325    Subjective  Rt hip/buttock pain was completely relieved by massage for 1-1.5 hrs. However then it returned    Pertinent History  PMH-prostate Ca, March 2018 seizures, lumbar compression fractures, restless legs and parkinsonian variant    Limitations  Walking;Standing;Sitting    How long can you stand comfortably?  not as bad as walking    How long can you walk comfortably?  grocery store distances starts to have pain    Diagnostic tests  MRI brain normal age related changes    Patient Stated Goals  Wants to get a program of exercises and learn how to use equipment at [facility]     Currently in Pain?  Yes    Pain Score  2     Pain Location  Hip    Pain Orientation  Right    Pain Descriptors / Indicators  Nagging    Pain Type  Chronic pain     Pain Radiating Towards  right buttock    Pain Onset  More than a month ago    Pain Frequency  Intermittent    Aggravating Factors   walking    Pain Relieving Factors  rest, pain medicine                       [facility] Adult PT Treatment/Exercise - 06/30/18 1336      Ambulation/Gait   Ambulation/Gait Assistance  5: Supervision;6: Modified independent (Device/Increase time)    Ambulation/Gait Assistance Details  vc for incr RLE step length, upright posture, weight shift over LLE    Ambulation Distance (Feet)  200 Feet   x2; plus 0.15 mi on treadmill   Assistive device  Straight cane    Gait Pattern  Poor foot clearance - left;Poor foot clearance - right;Step-through pattern;Decreased stride length;Decreased step length - right;Decreased stance time - left;Right foot flat    Ambulation Surface  Level;Indoor      Knee/Hip Exercises: Aerobic   Tread Mill  5.5 minutes, up to 2.0 mph      Knee/Hip Exercises: Standing   Forward  Step Up  Step Height: 6";10 reps;Right;Left;Hand Hold: 1   pt reported up/down with RLE did not aggravate rt hip   Forward Step Up Limitations  10 RLE, 10LLE      Stood in front of full length mirror to look at posture and work on wt-shifting over his LLE.  PWR [facility]) - 06/30/18 1409    PWR! exercises  Moves in standing    PWR! Up  10   at counter   PWR! Rock  20   RUE lower due to shoulder pain; improved wt-shift over LLE   PWR! Twist  10   back to counter, chair in front; vc for technique   PWR Step  20   one hand on counter         PT Education - 06/30/18 1424    Education Details  rt piriformis stretch; rt step ups with single UE support    Person(s) Educated  Patient    Methods  Explanation;Demonstration;Verbal cues;Tactile cues    Comprehension  Verbalized understanding;Returned demonstration;Verbal cues required;Tactile cues required;Need further instruction       PT Short Term Goals - 06/17/18 2113      PT SHORT TERM GOAL  #1   Title  Patient will be independent with HEP (incorporating use of gym equipment at his ILF, as appropriate) Target all STGs 06/18/18    Baseline  Patient independent with exercises, however not completing consistently due to recent move    Time  4    Period  Weeks    Status  Partially Met      PT SHORT TERM GOAL #2   Title  Improve FGA to >=16/30    Baseline  13/30; 06/17/18   20/30    Time  4    Period  Weeks    Status  Achieved      PT SHORT TERM GOAL #3   Title  Improve gait velocity to >=1.81 ft/sec with LRAD    Baseline  06/17/18  2.40 ft/sec    Time  4    Period  Weeks    Status  Achieved      PT SHORT TERM GOAL #4   Title  Complete 6 MWT for baseline and assist with exercise prescription    Baseline  completed 05/23/18    Time  4    Period  Weeks    Status  Achieved        PT Long Term Goals - 06/17/18 2114      PT LONG TERM GOAL #1   Title  Patient will be independent with updated HEP and feel confident with transition to exercising at gym (in his ILF) Target all LTGs 07/18/18    Time  8    Period  Weeks    Status  New      PT LONG TERM GOAL #2   Title  Patient will improve FGA to >= 23/30    Time  8    Period  Weeks    Status  Revised   based on progress at 4 weeks     PT LONG TERM GOAL #3   Title  Patient will incr gait velocity to >=2.62 ft/sec for improved safety with community ambulation    Time  8    Period  Weeks    Status  Revised   updated due to progress at 4 wks     PT LONG TERM GOAL #4   Title  Patient will improve 6MWT  by 100 ft compared to baseline     Baseline  06/17/18 incr 182 ft    Time  8    Period  Weeks    Status  Achieved      PT LONG TERM GOAL #5   Title  Patient will improve 6MWT to >=1200 ft    Time  8    Period  Weeks    Status  New            Plan - 06/30/18 1424    Clinical Impression Statement  Patient's rt posterior "hip" pain (thoracolumbar area) improved with his self-massage with tennis ball over the  weekend. Able to work on increasing step length (especially right) throughout session (both over ground and on treadmill). Standing PWR exercises with emphasis on wt-shift over LLE and trunk rotation. Patient noted to only have 7 weeks of PT scheduled since eval and plan to schedule his 8th week today as he leaves.     Rehab Potential  Good    Clinical Impairments Affecting Rehab Potential  chronic back pain, Parkinsonism per Neuro consult (non-medicated)    PT Frequency  2x / week    PT Duration  8 weeks    PT Treatment/Interventions  ADLs/Self Care Home Management;Aquatic Therapy;DME Instruction;Moist Heat;Gait training;Functional mobility training;Therapeutic activities;Therapeutic exercise;Balance training;Patient/family education;Neuromuscular re-education;Manual techniques;Vestibular;Passive range of motion    PT Next Visit Plan  Warm-up on Nustep or treadmill (allow pt to set up as he does at his ILF); Pre-gait working on foot clearance (?heel taps on foam bubbles, stepping over low objects--canes?) ?introduce modified quadruped PWR exercises. Inform pt of PWR classes    PT Home Exercise Plan   Access Code: [email or web address] ;PWR! Moves in Standing, PWR supine--modified to hooklying or one leg extended to protect low back (Up, Rock, Twist, Step)    Consulted and Agree with Plan of Care  Patient       Patient will benefit from skilled therapeutic intervention in order to improve the following deficits and impairments:  Abnormal gait, Decreased activity tolerance, Decreased balance, Decreased endurance, Decreased knowledge of use of DME, Decreased mobility, Decreased strength, Postural dysfunction, Pain  Visit Diagnosis: Abnormal posture  Other abnormalities of gait and mobility  Muscle weakness (generalized)     Problem List Patient Active Problem List   Diagnosis Date Noted  . Therapeutic drug monitoring 06/10/2017  . Partial symptomatic epilepsy with complex partial seizures, not  intractable, without status epilepticus ([place]) 12/05/2016  . Syncope 07/29/2016  . Unresponsiveness 07/28/2016  . BPH (benign prostatic hyperplasia) 07/28/2016  . Dyslipidemia 07/28/2016  . Anxiety   . History of prostate cancer   . TIA (transient ischemic attack) 07/12/2016    Rexanne Mano, PT 06/30/2018, 2:29 PM  [facility] [address] [place], [place], [address] Phone: [phone]   Fax:  [phone]  Name: Kenneth Bowen MRN: [id number] Date of Birth: 25-Sep-1930

## 2018-06-30 NOTE — Patient Instructions (Signed)
Discussed adding step up/down with RLE for hip strengthening Discussed adding rt piriformis stretch (in supine, rt knee to left shoulder)  *Unable to pull up [employer] to add to his program

## 2018-07-03 ENCOUNTER — Encounter: Payer: Self-pay | Admitting: Physical Therapy

## 2018-07-03 ENCOUNTER — Ambulatory Visit: Payer: Medicare Other | Admitting: Physical Therapy

## 2018-07-03 DIAGNOSIS — R2681 Unsteadiness on feet: Secondary | ICD-10-CM

## 2018-07-03 DIAGNOSIS — R293 Abnormal posture: Secondary | ICD-10-CM

## 2018-07-03 DIAGNOSIS — M6281 Muscle weakness (generalized): Secondary | ICD-10-CM

## 2018-07-03 NOTE — Therapy (Signed)
[facility] [address] [place], [place], [address] Phone: [phone]   Fax:  [phone]  Physical Therapy Treatment  Patient Details  Name: GRISELDA TOSH MRN: [id number] Date of Birth: [birth year] Referring Provider (PT): Garvin Fila, MD   Encounter Date: 07/03/2018  PT End of Session - 07/03/18 1411    Visit Number  13    Number of Visits  17    Date for PT Re-Evaluation  07/18/18    Authorization Type  Medicare A & B; [employer]    Authorization Time Period  05/19/18 to 07/18/18    PT Start Time  3329    PT Stop Time  1450    PT Time Calculation (min)  47 min    Activity Tolerance  Patient tolerated treatment well    Behavior During Therapy  [facility] for tasks assessed/performed       Past Medical History:  Diagnosis Date  . Colon polyps   . Diverticulosis   . Prostate cancer [facility]) 2015   radiation finished Nov 2015  . Seizures ([place]) 07/2016    Past Surgical History:  Procedure Laterality Date  . colonscopy    . INGUINAL HERNIA REPAIR    . KNEE SURGERY    . PROSTATE BIOPSY      There were no vitals filed for this visit.  Subjective Assessment - 07/03/18 1405    Subjective  His back and right hip/back were very sore after adding PWR twist last session. He has not exercised since. Did use the tennis ball to massage his rt/hip, but pain did not fully go away.     Pertinent History  PMH-prostate Ca, March 2018 seizures, lumbar compression fractures, restless legs and parkinsonian variant    Limitations  Walking;Standing;Sitting    How long can you stand comfortably?  not as bad as walking    How long can you walk comfortably?  grocery store distances starts to have pain    Diagnostic tests  MRI brain normal age related changes    Patient Stated Goals  Wants to get a program of exercises and learn how to use equipment at [facility]     Currently in Pain?  Yes    Pain Score  3     Pain Location  Hip    Pain Orientation  Right    Pain Descriptors / Indicators  Nagging    Pain Type  Chronic pain    Pain Radiating Towards  right buttock    Pain Onset  More than a month ago    Pain Frequency  Intermittent    Aggravating Factors   walking    Pain Relieving Factors  rest, massage, pain medicine                       [facility] Adult PT Treatment/Exercise - 07/03/18 1410      Ambulation/Gait   Ambulation/Gait Assistance  4: Min guard;6: Modified independent (Device/Increase time)    Ambulation/Gait Assistance Details  with cane modified independent; no device minguard, slight drag, shuffle of rt foot; vc for reaching heels forward to incr foot clearance and step length    Ambulation Distance (Feet)  240 Feet   cane; 240 no cane after pre-gait activities   Assistive device  Straight cane;None    Gait Pattern  Poor foot clearance - left;Poor foot clearance - right;Step-through pattern;Decreased stride length;Decreased step length - right;Decreased stance time - left;Right foot flat  Pre-Gait Activities  stepping laterally over yardsticks and then wooden dowel (~3/4"); progressed to forwards and backwards (3 obstacles with step through pattern; catching rt foot on obstacle ~30%of the time      Lumbar Exercises: Stretches   Single Knee to Chest Stretch  Right;Left;1 rep;30 seconds    Lower Trunk Rotation  1 rep;30 seconds    Piriformis Stretch  Right;Left;1 rep;30 seconds      Knee/Hip Exercises: Aerobic   Nustep  L4 x 4 extremities x       Knee/Hip Exercises: Standing   Forward Step Up  Left;1 set;10 reps;Hand Hold: 1;Step Height: 6"   lift up and lower down with LLE       PWR [facility]) - 07/03/18 1428    PWR! exercises  Moves in supine    PWR! Rock  10   began bil knees bent; progress to one leg straight   PWR! Twist  10   bil knees bent throughout   PWR! Step  10          PT Education - 07/03/18 1500    Education Details  pt reported rt shoulder soreness trying  to massage rt posterior hip with tennis ball--educated to sit and place tennis ball at sore point and lean into it rather than massage (deep pressure)     Person(s) Educated  Patient    Methods  Explanation;Verbal cues;Demonstration    Comprehension  Verbalized understanding       PT Short Term Goals - 06/17/18 2113      PT SHORT TERM GOAL #1   Title  Patient will be independent with HEP (incorporating use of gym equipment at his ILF, as appropriate) Target all STGs 06/18/18    Baseline  Patient independent with exercises, however not completing consistently due to recent move    Time  4    Period  Weeks    Status  Partially Met      PT SHORT TERM GOAL #2   Title  Improve FGA to >=16/30    Baseline  13/30; 06/17/18   20/30    Time  4    Period  Weeks    Status  Achieved      PT SHORT TERM GOAL #3   Title  Improve gait velocity to >=1.81 ft/sec with LRAD    Baseline  06/17/18  2.40 ft/sec    Time  4    Period  Weeks    Status  Achieved      PT SHORT TERM GOAL #4   Title  Complete 6 MWT for baseline and assist with exercise prescription    Baseline  completed 05/23/18    Time  4    Period  Weeks    Status  Achieved        PT Long Term Goals - 06/17/18 2114      PT LONG TERM GOAL #1   Title  Patient will be independent with updated HEP and feel confident with transition to exercising at gym (in his ILF) Target all LTGs 07/18/18    Time  8    Period  Weeks    Status  New      PT LONG TERM GOAL #2   Title  Patient will improve FGA to >= 23/30    Time  8    Period  Weeks    Status  Revised   based on progress at 4 weeks     PT LONG TERM GOAL #3  Title  Patient will incr gait velocity to >=2.62 ft/sec for improved safety with community ambulation    Time  8    Period  Weeks    Status  Revised   updated due to progress at 4 wks     PT LONG TERM GOAL #4   Title  Patient will improve 6MWT by 100 ft compared to baseline     Baseline  06/17/18 incr 182 ft    Time  8     Period  Weeks    Status  Achieved      PT LONG TERM GOAL #5   Title  Patient will improve 6MWT to >=1200 ft    Time  8    Period  Weeks    Status  New            Plan - 07/03/18 1502    Clinical Impression Statement  Session focused on decreasing challenge of supine PWR eexercises to help patient work through his increased back pain from standing PWR twist last session. After warm-up, stretches in supine, and supine PWR patient able to ambulate with and without cane without back or posterior hip pain. Gait training with and without cane with pt noted to take longer strides without cane (close-guarding by PT). Continues to favor LLE and takes short step with RLE. Patient can continue to benefit from PT.     Rehab Potential  Good    Clinical Impairments Affecting Rehab Potential  chronic back pain, Parkinsonism per Neuro consult (non-medicated)    PT Frequency  2x / week    PT Duration  8 weeks    PT Treatment/Interventions  ADLs/Self Care Home Management;Aquatic Therapy;DME Instruction;Moist Heat;Gait training;Functional mobility training;Therapeutic activities;Therapeutic exercise;Balance training;Patient/family education;Neuromuscular re-education;Manual techniques;Vestibular;Passive range of motion    PT Next Visit Plan  Warm-up on Nustep or treadmill (allow pt to set up as he does at his ILF); Pre-gait working on foot clearance (?heel taps on foam bubbles, stepping over low objects--canes?). Standing PWR including PWR twist in the corner (to decr ROM). Inform pt of PWR classes    PT Home Exercise Plan   Access Code: [email or web address] ;PWR! Moves in Standing, PWR supine--modified to hooklying or one leg extended to protect low back (Up, Rock, Twist, Step)    Consulted and Agree with Plan of Care  Patient       Patient will benefit from skilled therapeutic intervention in order to improve the following deficits and impairments:  Abnormal gait, Decreased activity tolerance, Decreased balance,  Decreased endurance, Decreased knowledge of use of DME, Decreased mobility, Decreased strength, Postural dysfunction, Pain  Visit Diagnosis: Abnormal posture  Muscle weakness (generalized)  Unsteadiness on feet     Problem List Patient Active Problem List   Diagnosis Date Noted  . Therapeutic drug monitoring 06/10/2017  . Partial symptomatic epilepsy with complex partial seizures, not intractable, without status epilepticus ([place]) 12/05/2016  . Syncope 07/29/2016  . Unresponsiveness 07/28/2016  . BPH (benign prostatic hyperplasia) 07/28/2016  . Dyslipidemia 07/28/2016  . Anxiety   . History of prostate cancer   . TIA (transient ischemic attack) 07/12/2016    Rexanne Mano, PT 07/03/2018, 3:07 PM  [facility] [address] [place], [place], [address] Phone: [phone]   Fax:  [phone]  Name: EMMERT ROETHLER MRN: [id number] Date of Birth: 27-Jan-1931

## 2018-07-07 ENCOUNTER — Ambulatory Visit: Payer: Medicare Other | Admitting: Physical Therapy

## 2018-07-07 ENCOUNTER — Encounter: Payer: Self-pay | Admitting: Physical Therapy

## 2018-07-07 DIAGNOSIS — R2681 Unsteadiness on feet: Secondary | ICD-10-CM | POA: Diagnosis not present

## 2018-07-07 DIAGNOSIS — R293 Abnormal posture: Secondary | ICD-10-CM

## 2018-07-07 DIAGNOSIS — M6281 Muscle weakness (generalized): Secondary | ICD-10-CM

## 2018-07-07 NOTE — Patient Instructions (Signed)
Nu Step: Workload 4 Arms at 12 Legs at 11 Keep steps per minute above 70 Try to go 10 minutes 3-5x/week. After a week try increasing your time by 2 minutes per week. Goal is to build up to 20-30 minutes

## 2018-07-07 NOTE — Therapy (Signed)
[facility] [address] [place], [place], [address] Phone: [phone]   Fax:  [phone]  Physical Therapy Treatment  Patient Details  Name: Kenneth Bowen MRN: [id number] Date of Birth: 10-26-1930 Referring Provider (PT): Garvin Fila, MD   Encounter Date: 07/07/2018  PT End of Session - 07/07/18 1314    Visit Number  14    Number of Visits  17    Date for PT Re-Evaluation  07/18/18    Authorization Type  Medicare A & B; [employer]    Authorization Time Period  05/19/18 to 07/18/18    PT Start Time  1315    PT Stop Time  1403    PT Time Calculation (min)  48 min    Activity Tolerance  Patient tolerated treatment well    Behavior During Therapy  [facility] for tasks assessed/performed       Past Medical History:  Diagnosis Date  . Colon polyps   . Diverticulosis   . Prostate cancer [facility]) 2015   radiation finished Nov 2015  . Seizures ([place]) 07/2016    Past Surgical History:  Procedure Laterality Date  . colonscopy    . INGUINAL HERNIA REPAIR    . KNEE SURGERY    . PROSTATE BIOPSY      There were no vitals filed for this visit.  Subjective Assessment - 07/07/18 1315    Subjective  Did his exercises Fri and Sat and was really sore on Sunday. Wondered if he needs to do more of a warm-up before he does the big stretches. Recommended using the Nustep in his building or take a walk or do first 10 reps of each exercise gently and then last 10 push the stretching.     Pertinent History  PMH-prostate Ca, March 2018 seizures, lumbar compression fractures, restless legs and parkinsonian variant    Limitations  Walking;Standing;Sitting    How long can you stand comfortably?  not as bad as walking    How long can you walk comfortably?  grocery store distances starts to have pain    Diagnostic tests  MRI brain normal age related changes    Patient Stated Goals  Wants to get a program of exercises and learn how to use equipment  at [facility]     Currently in Pain?  No/denies    Pain Onset  --                       [facility] Adult PT Treatment/Exercise - 07/07/18 0001      Ambulation/Gait   Ambulation/Gait Assistance  6: Modified independent (Device/Increase time);4: Min guard    Ambulation/Gait Assistance Details  with cane modified independent; no device minguard for safety    Ambulation Distance (Feet)  100 Feet   120 + between stations   Assistive device  Straight cane;None    Gait Pattern  Poor foot clearance - left;Poor foot clearance - right;Step-through pattern;Decreased stride length;Decreased step length - right;Decreased stance time - left;Right foot flat      Posture/Postural Control   Posture/Postural Control  Postural limitations    Postural Limitations  Rounded Shoulders;Forward head    Posture Comments  during PWR exercises initially required cues for upright posture, pt then began to self-correct (no cues)      Knee/Hip Exercises: Aerobic   Nustep  L4 x 4 extremities x 10.5 minutes        [facility]) - 07/07/18 2018  PWR! exercises  Moves in standing;Moves in supine    Basic 4 Flow Supine  4    Comments  max cues for flow (first time attempted)    PWR! Up Standing  10    PWR! Rock  10    PWR! Twist  10    PWR Step  10    Comments  at counter, except PWR twist in corner to limit ROM (and potential cause of back soreness)          PT Education - 07/07/18 2023    Education Details  reviewed importance of aerobic exercise in cognitive skills (pt has not been to use Nustep at his independent living facility since moving apartments). Warm-up progressed to educating pt on intensity of aerobic exercise. Educated on proper intensity of standing PWR exercises to minimize strain on his low back. Introduced pt to [employer] flow technique (in supine) for cognitive challenge while doing PWR exercises.     Person(s) Educated  Patient    Methods  Explanation;Demonstration;Verbal  cues;Handout    Comprehension  Verbalized understanding;Returned demonstration;Verbal cues required       PT Short Term Goals - 06/17/18 2113      PT SHORT TERM GOAL #1   Title  Patient will be independent with HEP (incorporating use of gym equipment at his ILF, as appropriate) Target all STGs 06/18/18    Baseline  Patient independent with exercises, however not completing consistently due to recent move    Time  4    Period  Weeks    Status  Partially Met      PT SHORT TERM GOAL #2   Title  Improve FGA to >=16/30    Baseline  13/30; 06/17/18   20/30    Time  4    Period  Weeks    Status  Achieved      PT SHORT TERM GOAL #3   Title  Improve gait velocity to >=1.81 ft/sec with LRAD    Baseline  06/17/18  2.40 ft/sec    Time  4    Period  Weeks    Status  Achieved      PT SHORT TERM GOAL #4   Title  Complete 6 MWT for baseline and assist with exercise prescription    Baseline  completed 05/23/18    Time  4    Period  Weeks    Status  Achieved        PT Long Term Goals - 06/17/18 2114      PT LONG TERM GOAL #1   Title  Patient will be independent with updated HEP and feel confident with transition to exercising at gym (in his ILF) Target all LTGs 07/18/18    Time  8    Period  Weeks    Status  New      PT LONG TERM GOAL #2   Title  Patient will improve FGA to >= 23/30    Time  8    Period  Weeks    Status  Revised   based on progress at 4 weeks     PT LONG TERM GOAL #3   Title  Patient will incr gait velocity to >=2.62 ft/sec for improved safety with community ambulation    Time  8    Period  Weeks    Status  Revised   updated due to progress at 4 wks     PT LONG TERM GOAL #4   Title  Patient will  improve 6MWT by 100 ft compared to baseline     Baseline  06/17/18 incr 182 ft    Time  8    Period  Weeks    Status  Achieved      PT LONG TERM GOAL #5   Title  Patient will improve 6MWT to >=1200 ft    Time  8    Period  Weeks    Status  New             Plan - 07/07/18 2028    Clinical Impression Statement  Session focused on importance of both PWR exercises and aerobic activity in managing Parkinson's symptoms. Educated on ways to warm-up each day prior to doing his PWR exercises and to do initial 5 reps with less intensity to assist with warm-up and reduce risk of aggravating his back pain. Pt very appreciative for the lessons he has learned.     Rehab Potential  Good    Clinical Impairments Affecting Rehab Potential  chronic back pain, Parkinsonism per Neuro consult (non-medicated)    PT Frequency  2x / week    PT Duration  8 weeks    PT Treatment/Interventions  ADLs/Self Care Home Management;Aquatic Therapy;DME Instruction;Moist Heat;Gait training;Functional mobility training;Therapeutic activities;Therapeutic exercise;Balance training;Patient/family education;Neuromuscular re-education;Manual techniques;Vestibular;Passive range of motion    PT Next Visit Plan  HOw is his back doing? Warm-up on Nustep or treadmill (allow pt to set up as he does at his ILF); Pre-gait working on foot clearance. Standing PWR ?Flow including PWR twist in the corner (to decr ROM).     PT Home Exercise Plan   Access Code: [email or web address] ;PWR! Moves in Standing, PWR supine--modified to hooklying or one leg extended to protect low back (Up, Rock, Twist, Step)    Consulted and Agree with Plan of Care  Patient       Patient will benefit from skilled therapeutic intervention in order to improve the following deficits and impairments:  Abnormal gait, Decreased activity tolerance, Decreased balance, Decreased endurance, Decreased knowledge of use of DME, Decreased mobility, Decreased strength, Postural dysfunction, Pain  Visit Diagnosis: Abnormal posture  Muscle weakness (generalized)  Unsteadiness on feet     Problem List Patient Active Problem List   Diagnosis Date Noted  . Therapeutic drug monitoring 06/10/2017  . Partial symptomatic epilepsy with  complex partial seizures, not intractable, without status epilepticus ([place]) 12/05/2016  . Syncope 07/29/2016  . Unresponsiveness 07/28/2016  . BPH (benign prostatic hyperplasia) 07/28/2016  . Dyslipidemia 07/28/2016  . Anxiety   . History of prostate cancer   . TIA (transient ischemic attack) 07/12/2016    Rexanne Mano, PT 07/07/2018, 8:33 PM  [facility] [address] [place], [place], [address] Phone: [phone]   Fax:  [phone]  Name: Kenneth Bowen MRN: [id number] Date of Birth: [birth year]

## 2018-07-10 ENCOUNTER — Ambulatory Visit: Payer: Medicare Other | Admitting: Physical Therapy

## 2018-07-10 ENCOUNTER — Encounter: Payer: Self-pay | Admitting: Physical Therapy

## 2018-07-10 DIAGNOSIS — R2681 Unsteadiness on feet: Secondary | ICD-10-CM | POA: Diagnosis not present

## 2018-07-10 DIAGNOSIS — M6281 Muscle weakness (generalized): Secondary | ICD-10-CM

## 2018-07-10 DIAGNOSIS — R293 Abnormal posture: Secondary | ICD-10-CM

## 2018-07-10 NOTE — Therapy (Signed)
[facility] [address] [place], [place], [address] Phone: [phone]   Fax:  [phone]  Physical Therapy Treatment  Patient Details  Name: Kenneth Bowen MRN: [id number] Date of Birth: [birth year] Referring Provider (PT): Garvin Fila, MD   Encounter Date: 07/10/2018  PT End of Session - 07/10/18 1314    Visit Number  15    Number of Visits  17    Date for PT Re-Evaluation  07/18/18    Authorization Type  Medicare A & B; [employer]    Authorization Time Period  05/19/18 to 07/18/18    PT Start Time  1315    PT Stop Time  1402    PT Time Calculation (min)  47 min    Activity Tolerance  Patient tolerated treatment well    Behavior During Therapy  [facility] for tasks assessed/performed       Past Medical History:  Diagnosis Date  . Colon polyps   . Diverticulosis   . Prostate cancer [facility]) 2015   radiation finished Nov 2015  . Seizures ([place]) 07/2016    Past Surgical History:  Procedure Laterality Date  . colonscopy    . INGUINAL HERNIA REPAIR    . KNEE SURGERY    . PROSTATE BIOPSY      There were no vitals filed for this visit.  Subjective Assessment - 07/10/18 1314    Subjective  Did get on the Nustep x 10 minutes yesterday. Also did alot of walking and hip/back are doing well today.     Pertinent History  PMH-prostate Ca, March 2018 seizures, lumbar compression fractures, restless legs and parkinsonian variant    Limitations  Walking;Standing;Sitting    How long can you stand comfortably?  not as bad as walking    How long can you walk comfortably?  grocery store distances starts to have pain    Diagnostic tests  MRI brain normal age related changes    Patient Stated Goals  Wants to get a program of exercises and learn how to use equipment at [facility]     Currently in Pain?  No/denies                       [facility] Adult PT Treatment/Exercise - 07/10/18 1703      Ambulation/Gait   Ambulation/Gait  Yes    Ambulation/Gait Assistance  6: Modified independent (Device/Increase time);4: Min assist    Ambulation/Gait Assistance Details  focus on wt-shift over LLE to allow longer RLE step length; trial of walking stick in RUE to see if improved pt's upright posture--pt did not feel as safe with the small tip on the walking stick    Ambulation Distance (Feet)  500 Feet    Assistive device  Straight cane;None;Other (Comment)   walking stick   Gait Pattern  Poor foot clearance - left;Poor foot clearance - right;Step-through pattern;Decreased stride length;Decreased step length - right;Decreased stance time - left;Right foot flat    Ambulation Surface  Level;Indoor      Knee/Hip Exercises: Aerobic   Tread Mill  10 min (including 1 min warm-up and cool-down) up to 1.7 mph; vc and facilitation for wt-shift over LLE and incr RLE step length        P[facility]) - 07/10/18 1706    PWR! exercises  Moves in standing    PWR! Rock  20   focus on wtshift over LLE; did best with bil hands on counte  PWR! Twist  10   standing in corner   Basic 4 Flow  5    Comments  at counter; pt required pictures for sequence of Flow and assist with first two reps (excluded twist during Flow at counter)            PT Short Term Goals - 06/17/18 2113      PT SHORT TERM GOAL #1   Title  Patient will be independent with HEP (incorporating use of gym equipment at his ILF, as appropriate) Target all STGs 06/18/18    Baseline  Patient independent with exercises, however not completing consistently due to recent move    Time  4    Period  Weeks    Status  Partially Met      PT SHORT TERM GOAL #2   Title  Improve FGA to >=16/30    Baseline  13/30; 06/17/18   20/30    Time  4    Period  Weeks    Status  Achieved      PT SHORT TERM GOAL #3   Title  Improve gait velocity to >=1.81 ft/sec with LRAD    Baseline  06/17/18  2.40 ft/sec    Time  4    Period  Weeks    Status  Achieved      PT SHORT  TERM GOAL #4   Title  Complete 6 MWT for baseline and assist with exercise prescription    Baseline  completed 05/23/18    Time  4    Period  Weeks    Status  Achieved        PT Long Term Goals - 06/17/18 2114      PT LONG TERM GOAL #1   Title  Patient will be independent with updated HEP and feel confident with transition to exercising at gym (in his ILF) Target all LTGs 07/18/18    Time  8    Period  Weeks    Status  New      PT LONG TERM GOAL #2   Title  Patient will improve FGA to >= 23/30    Time  8    Period  Weeks    Status  Revised   based on progress at 4 weeks     PT LONG TERM GOAL #3   Title  Patient will incr gait velocity to >=2.62 ft/sec for improved safety with community ambulation    Time  8    Period  Weeks    Status  Revised   updated due to progress at 4 wks     PT LONG TERM GOAL #4   Title  Patient will improve 6MWT by 100 ft compared to baseline     Baseline  06/17/18 incr 182 ft    Time  8    Period  Weeks    Status  Achieved      PT LONG TERM GOAL #5   Title  Patient will improve 6MWT to >=1200 ft    Time  8    Period  Weeks    Status  New            Plan - 07/10/18 1653    Clinical Impression Statement  Session focused on instruction in use of treadmill (pt plans to use one at his ILF) including gait training (vc and facilitation for incr wt-shift to his left to allow incr rt step length) and focus on intensity and large movement patterns when  doing PWR exercises (which are part of his HEP). Updated modified technique for standing PWR Rock and Twist for HEP to improve wt-shifitng when walking. Patient continues to make progress towards LTGs. Plan for discharge next week.     Rehab Potential  Good    Clinical Impairments Affecting Rehab Potential  chronic back pain, Parkinsonism per Neuro consult (non-medicated)    PT Frequency  2x / week    PT Duration  8 weeks    PT Treatment/Interventions  ADLs/Self Care Home Management;Aquatic  Therapy;DME Instruction;Moist Heat;Gait training;Functional mobility training;Therapeutic activities;Therapeutic exercise;Balance training;Patient/family education;Neuromuscular re-education;Manual techniques;Vestibular;Passive range of motion    PT Next Visit Plan  Warm-up on Nustep or treadmill (allow pt to set up as he does at his ILF); how did he do with PWR twist in the corner? Begin to check LTGs with plan for d/c 3/6    PT Home Exercise Plan   Access Code: [email or web address] ;PWR! Moves in Standing, PWR supine--modified to hooklying or one leg extended to protect low back (Up, Rock, Twist, Step)    Consulted and Agree with Plan of Care  Patient       Patient will benefit from skilled therapeutic intervention in order to improve the following deficits and impairments:  Abnormal gait, Decreased activity tolerance, Decreased balance, Decreased endurance, Decreased knowledge of use of DME, Decreased mobility, Decreased strength, Postural dysfunction, Pain  Visit Diagnosis: Abnormal posture  Muscle weakness (generalized)     Problem List Patient Active Problem List   Diagnosis Date Noted  . Therapeutic drug monitoring 06/10/2017  . Partial symptomatic epilepsy with complex partial seizures, not intractable, without status epilepticus ([place]) 12/05/2016  . Syncope 07/29/2016  . Unresponsiveness 07/28/2016  . BPH (benign prostatic hyperplasia) 07/28/2016  . Dyslipidemia 07/28/2016  . Anxiety   . History of prostate cancer   . TIA (transient ischemic attack) 07/12/2016    Rexanne Mano, PT 07/10/2018, 5:09 PM  [facility] [address] [place], [place], [address] Phone: [phone]   Fax:  [phone]  Name: Kenneth Bowen MRN: [id number] Date of Birth: [birth year]

## 2018-07-10 NOTE — Patient Instructions (Signed)
   When doing PWR Rock at the counter, begin with both hands on the counter and REALLY shift as far as you can over the LEFT foot. Do at least 10 reps and then add in the arm reach.  Add in the PWR twist exercise (3rd one on the sheet). Stand in a corner to limit how far out to the side your arms reach. Be sure to let your hip turn and your heel to come up off the floor (this puts less strain on your back).

## 2018-07-16 ENCOUNTER — Ambulatory Visit: Payer: Medicare Other | Attending: Neurology | Admitting: Physical Therapy

## 2018-07-16 DIAGNOSIS — R2681 Unsteadiness on feet: Secondary | ICD-10-CM

## 2018-07-16 DIAGNOSIS — R293 Abnormal posture: Secondary | ICD-10-CM | POA: Diagnosis present

## 2018-07-16 DIAGNOSIS — M6281 Muscle weakness (generalized): Secondary | ICD-10-CM | POA: Diagnosis present

## 2018-07-16 DIAGNOSIS — R29818 Other symptoms and signs involving the nervous system: Secondary | ICD-10-CM | POA: Diagnosis present

## 2018-07-16 DIAGNOSIS — R2689 Other abnormalities of gait and mobility: Secondary | ICD-10-CM | POA: Diagnosis present

## 2018-07-16 NOTE — Therapy (Signed)
[facility] [address] [place], [place], [address] Phone: [phone]   Fax:  [phone]  Physical Therapy Treatment  Patient Details  Name: Kenneth Bowen MRN: [id number] Date of Birth: 02-11-1931 Referring Provider (PT): Garvin Fila, MD   Encounter Date: 07/16/2018  PT End of Session - 07/16/18 1419    Visit Number  16    Number of Visits  17    Date for PT Re-Evaluation  07/18/18    Authorization Type  Medicare A & B; [employer]    Authorization Time Period  05/19/18 to 07/18/18    PT Start Time  1319    PT Stop Time  1400    PT Time Calculation (min)  41 min    Activity Tolerance  Patient tolerated treatment well    Behavior During Therapy  [facility] for tasks assessed/performed       Past Medical History:  Diagnosis Date  . Colon polyps   . Diverticulosis   . Prostate cancer [facility]) 2015   radiation finished Nov 2015  . Seizures ([place]) 07/2016    Past Surgical History:  Procedure Laterality Date  . colonscopy    . INGUINAL HERNIA REPAIR    . KNEE SURGERY    . PROSTATE BIOPSY      There were no vitals filed for this visit.  Subjective Assessment - 07/16/18 1320    Subjective  Pt reports having a fall in the dining hall since hs last visit while turning to pick his cane up.  Denies injury other than soreness but is feeling better today.  Has not done exercises since last treatment due to fall and soreness.  DId use Nustep at facility for 10 minutes the day of his fall.    Pertinent History  PMH-prostate Ca, March 2018 seizures, lumbar compression fractures, restless legs and parkinsonian variant    Limitations  Walking;Standing;Sitting    How long can you stand comfortably?  not as bad as walking    How long can you walk comfortably?  grocery store distances starts to have pain    Diagnostic tests  MRI brain normal age related changes    Patient Stated Goals  Wants to get a program of exercises and learn how to  use equipment at [facility]     Currently in Pain?  No/denies        [facility] Adult PT Treatment/Exercise - 07/16/18 0001      Ambulation/Gait   Ambulation/Gait  Yes    Ambulation/Gait Assistance  6: Modified independent (Device/Increase time);4: Min assist    Ambulation/Gait Assistance Details  focused on weight shift over LLE and increased step on RLE    Ambulation Distance (Feet)  100 Feet   x 4   Assistive device  None    Gait Pattern  Poor foot clearance - left;Poor foot clearance - right;Step-through pattern;Decreased stride length;Decreased step length - right;Decreased stance time - left;Right foot flat    Ambulation Surface  Level;Indoor    Pre-Gait Activities  Worked on quarter turns with back of chairs for UE support going both directions.  Cues to encourage increased step size/length.  Worked on going both directions.      Knee/Hip Exercises: Aerobic   Tread Mill  7 minutes working up to 2.0 mph.  Focusing on LLE weight bearing/wt shift to L and increasing RLE step length.    Nustep  L4 x 4 extremities with workload of 4 x 10 minutes.  Steps  per minute >80 with bouts of >100.        PWR [facility]) - 07/16/18 1422    PWR! exercises  --   PWR! twist in standing in corner x 10 reps with proper tech         PT Education - 07/16/18 1418    Education Details  Checking goals next session and d/c planned per Barry Brunner, PT.  Work on [employer] before next session to assess if any changes need to be made.  Quarter turns and importance of picking up feet with turn vs just a twist.    Person(s) Educated  Patient    Methods  Explanation;Demonstration    Comprehension  Verbalized understanding       PT Short Term Goals - 06/17/18 2113      PT SHORT TERM GOAL #1   Title  Patient will be independent with HEP (incorporating use of gym equipment at his ILF, as appropriate) Target all STGs 06/18/18    Baseline  Patient independent with exercises, however not completing consistently due  to recent move    Time  4    Period  Weeks    Status  Partially Met      PT SHORT TERM GOAL #2   Title  Improve FGA to >=16/30    Baseline  13/30; 06/17/18   20/30    Time  4    Period  Weeks    Status  Achieved      PT SHORT TERM GOAL #3   Title  Improve gait velocity to >=1.81 ft/sec with LRAD    Baseline  06/17/18  2.40 ft/sec    Time  4    Period  Weeks    Status  Achieved      PT SHORT TERM GOAL #4   Title  Complete 6 MWT for baseline and assist with exercise prescription    Baseline  completed 05/23/18    Time  4    Period  Weeks    Status  Achieved        PT Long Term Goals - 06/17/18 2114      PT LONG TERM GOAL #1   Title  Patient will be independent with updated HEP and feel confident with transition to exercising at gym (in his ILF) Target all LTGs 07/18/18    Time  8    Period  Weeks    Status  New      PT LONG TERM GOAL #2   Title  Patient will improve FGA to >= 23/30    Time  8    Period  Weeks    Status  Revised   based on progress at 4 weeks     PT LONG TERM GOAL #3   Title  Patient will incr gait velocity to >=2.62 ft/sec for improved safety with community ambulation    Time  8    Period  Weeks    Status  Revised   updated due to progress at 4 wks     PT LONG TERM GOAL #4   Title  Patient will improve 6MWT by 100 ft compared to baseline     Baseline  06/17/18 incr 182 ft    Time  8    Period  Weeks    Status  Achieved      PT LONG TERM GOAL #5   Title  Patient will improve 6MWT to >=1200 ft    Time  8  Period  Weeks    Status  New            Plan - 07/16/18 1419    Clinical Impression Statement  Skilled session focused on Nustep and treadmill (to encourage LLE weight bearing and return to gym at ILF) along with quarter turns to improve mobility in tight spaces at home.  Continue PT per POC.    Rehab Potential  Good    Clinical Impairments Affecting Rehab Potential  chronic back pain, Parkinsonism per Neuro consult (non-medicated)     PT Frequency  2x / week    PT Duration  8 weeks    PT Treatment/Interventions  ADLs/Self Care Home Management;Aquatic Therapy;DME Instruction;Moist Heat;Gait training;Functional mobility training;Therapeutic activities;Therapeutic exercise;Balance training;Patient/family education;Neuromuscular re-education;Manual techniques;Vestibular;Passive range of motion    PT Next Visit Plan  Check goals and discharge per Barry Brunner, PT.    PT Home Exercise Plan   Access Code: [email or web address] ;PWR! Moves in Standing, PWR supine--modified to hooklying or one leg extended to protect low back (Up, Rock, Twist, Step)    Consulted and Agree with Plan of Care  Patient       Patient will benefit from skilled therapeutic intervention in order to improve the following deficits and impairments:  Abnormal gait, Decreased activity tolerance, Decreased balance, Decreased endurance, Decreased knowledge of use of DME, Decreased mobility, Decreased strength, Postural dysfunction, Pain  Visit Diagnosis: Abnormal posture  Muscle weakness (generalized)  Unsteadiness on feet  Other abnormalities of gait and mobility  Other symptoms and signs involving the nervous system     Problem List Patient Active Problem List   Diagnosis Date Noted  . Therapeutic drug monitoring 06/10/2017  . Partial symptomatic epilepsy with complex partial seizures, not intractable, without status epilepticus ([place]) 12/05/2016  . Syncope 07/29/2016  . Unresponsiveness 07/28/2016  . BPH (benign prostatic hyperplasia) 07/28/2016  . Dyslipidemia 07/28/2016  . Anxiety   . History of prostate cancer   . TIA (transient ischemic attack) 07/12/2016   Narda Bonds, PTA [facility] 07/16/18 2:24 PM Phone: [phone] Fax: [facility] [address] [place], [place], [address] Phone: [phone]   Fax:  [phone]  Name:  Kenneth Bowen MRN: [id number] Date of Birth: [birth year]

## 2018-07-18 ENCOUNTER — Ambulatory Visit: Payer: Medicare Other | Admitting: Physical Therapy

## 2018-07-20 ENCOUNTER — Other Ambulatory Visit: Payer: Self-pay | Admitting: Neurology

## 2018-07-21 ENCOUNTER — Other Ambulatory Visit: Payer: Self-pay

## 2018-07-21 MED ORDER — TOPIRAMATE 25 MG PO TABS: 25.0000 mg | ORAL_TABLET | Freq: Every evening | ORAL | 6 refills | Status: DC | PRN

## 2018-07-22 ENCOUNTER — Ambulatory Visit: Payer: Medicare Other | Admitting: Nurse Practitioner

## 2018-08-05 ENCOUNTER — Ambulatory Visit: Payer: Medicare Other | Admitting: Physical Therapy

## 2018-11-02 ENCOUNTER — Other Ambulatory Visit: Payer: Self-pay | Admitting: Neurology

## 2018-11-03 ENCOUNTER — Other Ambulatory Visit: Payer: Self-pay

## 2018-11-03 MED ORDER — DIVALPROEX SODIUM ER 500 MG PO TB24: 500.0000 mg | ORAL_TABLET | Freq: Every day | ORAL | 3 refills | Status: DC

## 2019-01-09 ENCOUNTER — Other Ambulatory Visit: Payer: Self-pay | Admitting: Neurology

## 2019-02-10 ENCOUNTER — Other Ambulatory Visit: Payer: Self-pay | Admitting: Neurology

## 2019-02-16 ENCOUNTER — Encounter: Payer: Self-pay | Admitting: Neurology

## 2019-02-16 ENCOUNTER — Other Ambulatory Visit: Payer: Self-pay

## 2019-02-16 ENCOUNTER — Ambulatory Visit (INDEPENDENT_AMBULATORY_CARE_PROVIDER_SITE_OTHER): Payer: Medicare Other | Admitting: Neurology

## 2019-02-16 VITALS — BP 110/68 | HR 77 | Temp 97.8°F | Wt 183.0 lb

## 2019-02-16 DIAGNOSIS — G2581 Restless legs syndrome: Secondary | ICD-10-CM | POA: Diagnosis not present

## 2019-02-16 MED ORDER — DIVALPROEX SODIUM ER 500 MG PO TB24: 500.0000 mg | ORAL_TABLET | Freq: Every day | ORAL | 3 refills | Status: DC

## 2019-02-16 MED ORDER — TOPIRAMATE 25 MG PO TABS: ORAL_TABLET | ORAL | 2 refills | Status: DC

## 2019-02-16 NOTE — Patient Instructions (Signed)
I had a long discussion with the patient with regards to his gait abnormality and tendency to fall backwards which likely appears to be multifactorial given his combination of degenerative lumbar spine disease, mild or aparkinsonism as well as restless legs.  His bradykinesia and parkinsonian symptoms are not significant enough to justify a trial of medication at the present time.  I recommend he continue Topamax  50mg  at night for restless legs  .  Continue Depakote and the present dose for his complex partial seizures which appear to be stable.  He will return for follow-up in the future in a year or call earlier if necessary.

## 2019-02-16 NOTE — Progress Notes (Signed)
[facility] [address] [place]. [place] [address] [phone]       OFFICE FOLLOW-UP NOTE  Kenneth. Kenneth Bowen Date of Birth:  1930-05-22 Medical Record Number:  [id number]   HPI: Initial visit 02/10/2018 Kenneth Bowen is a 17 year Caucasian male seen today for first office follow-up visit for in-hospital with admission in March 2018 for episode of brief loss of awareness. History is obtained from the patient, review of electronic medical records and have personally reviewed imaging for films. Kenneth Elzey Basingeris an 85 y.o.malewho was  at home earlier and wife saw him standing up against the wall unresponsive, staring and had a bowel movement.    He was able to share what his wife described as another similar episodes  Two weeks ago, patient was leaning over to pick up something and says he may have strained to reach down to get the item.  He says his head was "all the way down."  When he sat up, he apparently became dizzy and had blurred vision.  He then became confused and his wife told him that for 20 minutes he "reached for things that were not there and acted confused."  This lasted for 20 minutes and he returned to normal by the time EMS arrived.  The second event occurred prior to admission.  Patient was sitting upright and did not have a positional change or strain to reach for something.  He had sudden onset of dizziness.  Like the time two weeks before, his wife assisted him to another room but he was confused again.  This time the patient did not "return to clear understanding" until he was in the ambulance.  This was approximately 30-40 minutes after onset.  When asked if this has happened before, patient recalled an event 10 years prior when he suddenly lost his words for a few minutes and an event 15 years prior when he had trouble understanding the words he was reading.  However, no confusion spells of note. CT scan of the brain on admission was unremarkable and CT  angiogram showed no large vessel stenosis or occlusion. EEG was obtained which showed no definite seizure activity. Transthoracic echo showed normal ejection fraction. LDL cholesterol was 55 mg percent. Hemoglobin A1c was 5.8. Patient was started on a trial of Depakote ER 500 mg daily. He states he tolerated well but he ran out of a week ago has not filled it yet. He complained of increased frequency of soft stools about 3 times a day he felt this may be related to Depakote. He has had no further recurrent episodes. Denies any prior history of significant head injury with loss of consciousness, childhood epilepsy or other seizures. Update 04/28/2018 : He returns for follow-up after last visit 2-1/2 months ago.  He had unexplained fall backwards when he was getting up quickly.  He saw his primary physician Dr. Nyoka Cowden who recommended he see me for further evaluation.  He states that he has had several such falls following his initial fall in 2017 when he fell and sustained compression fracture of 2 vertebrae in the lumbar spine.  Since then he is learned to get up slowly but most of his falls have been backwards.  He denies any passing out or severe pain leading to the fall.  Patient was seen last week in our office in September 2019 at that time he had comprehensive metabolic panel as well as CBC drawn which were unremarkable.  He remains on Depakote  ER 500 daily and has had no further episodes of seizure-like events.  He is been bothered by swelling in his feet and Dr. Nyoka Cowden feels this may be related to Depakote.  He has been started on Lasix 20 mg but so far has not shown significant improvement.  He denies any weight gain, tiredness, tremors or dizziness.  Patient denies any leg pain, tingling and numbness.  He does have a feeling of restlessness particularly at night and had trouble sleeping but since he has been taking Xanax 1 mg at night as well as trazodone 50 mg the symptoms are under control.  He has not  tried other medications for restless legs like Topamax, gabapentin, Requip or Lyrica.  He denies any tremors in his hands, drooling of saliva, ,bradykinesia, festination no stooped posture.  He still has chronic back pain and his walking is affected by that and he has to favor his back while getting up and sitting down.  He denies radicular pain or numbness. Update 02/16/2019 : He returns for follow-up after last visit 10 months ago.  He states is doing well.  Continues to have gait and balance difficulties with tendency to fall backwards.  He is at only one fall as he is quite careful with no major injuries.  Did undergo MRI scan of the brain on 05/12/2018 which I personally reviewed shows changes of generalized cerebral atrophy as well as changes of chronic microvascular disease.  No significant change compared with previous MRI from 07/18/2016.  The patient states he is tolerating Topamax 50 mg at bedtime quite well and seems to have helped his restless leg syndrome.  He still however needs to take Xanax as it helps him sleep.  He denies significant tremor or bradykinesia with drooling of saliva.  He feels overall his gait balance and difficulty is about unchanged  ROS:   14 system review of systems is positive for imbalance, falling backwards, gait difficulties only all other systems negative  PMH:  Past Medical History:  Diagnosis Date  . Colon polyps   . Diverticulosis   . Prostate cancer [place]) 2015   radiation finished Nov 2015  . Seizures ([place]) 07/2016    Social History:  Social History   Socioeconomic History  . Marital status: Married    Spouse name: Not on file  . Number of children: Not on file  . Years of education: Not on file  . Highest education level: Not on file  Occupational History  . Occupation: civil Glass blower/designer  . Financial resource strain: Not on file  . Food insecurity    Worry: Not on file    Inability: Not on file  . Transportation needs    Medical:  Not on file    Non-medical: Not on file  Tobacco Use  . Smoking status: Never Smoker  . Smokeless tobacco: Never Used  Substance and Sexual Activity  . Alcohol use: Yes    Alcohol/week: 7.0 standard drinks    Types: 7 Glasses of wine per week    Comment: 4 oz nightly  . Drug use: No  . Sexual activity: Yes  Lifestyle  . Physical activity    Days per week: Not on file    Minutes per session: Not on file  . Stress: Not on file  Relationships  . Social Herbalist on phone: Not on file    Gets together: Not on file    Attends religious service: Not on file  Active member of club or organization: Not on file    Attends meetings of clubs or organizations: Not on file    Relationship status: Not on file  . Intimate partner violence    Fear of current or ex partner: Not on file    Emotionally abused: Not on file    Physically abused: Not on file    Forced sexual activity: Not on file  Other Topics Concern  . Not on file  Social History Narrative   Lives with wife at [facility] in [place].  Uses cane.      Medications:   Current Outpatient Medications on File Prior to Visit  Medication Sig Dispense Refill  . ALPRAZolam (XANAX) 1 MG tablet Take 0.5 mg by mouth at bedtime.    . ARTIFICIAL TEAR OP Place 1 drop into both eyes daily as needed. For dry eyes    . aspirin 81 MG tablet Take 1 tablet (81 mg total) by mouth daily. Over the counter 30 tablet 3  . cholecalciferol (VITAMIN D) 1000 UNITS tablet Take 1,000 Units by mouth 2 (two) times daily.     . furosemide (LASIX) 20 MG tablet 20 mg daily.     . IRON PO Take 1 tablet by mouth See admin instructions. Take couple times a week per patient. No specific days    . Multiple Vitamin (MULTIVITAMIN) tablet Take 1 tablet by mouth daily.    . Omega-3 Fatty Acids (FISH OIL) 1000 MG CAPS Take 1,000 mg by mouth daily.    Marland Kitchen omeprazole (PRILOSEC) 20 MG capsule Take by mouth daily.    . tamsulosin (FLOMAX) 0.4 MG  CAPS capsule TAKE 1 CAPSULE (0.4 MG TOTAL) BY MOUTH DAILY AFTER SUPPER. 30 capsule 5  . traZODone (DESYREL) 50 MG tablet Take 25 mg by mouth at bedtime.      No current facility-administered medications on file prior to visit.     Allergies:  No Known Allergies  Physical Exam General: Frail elderly Caucasian male, seated, in no evident distress Head: head normocephalic and atraumatic.  Neck: supple with no carotid or supraclavicular bruits Cardiovascular: regular rate and rhythm, no murmurs Musculoskeletal: no deformity Skin:  no rash/petichiae Vascular:  Normal pulses all extremities Vitals:   02/16/19 1402  BP: 110/68  Pulse: 77  Temp: 97.8 F (36.6 C)   Neurologic Exam Mental Status: Awake and fully alert. Oriented to place and time. Recent and remote memory intact. Attention span, concentration and fund of knowledge appropriate. Mood and affect appropriate.  Diminished facial expression.  Positive glabellar tap. Cranial Nerves: Fundoscopic exam reveals sharp disc margins. Pupils equal, briskly reactive to light. Extraocular movements full without nystagmus. Visual fields full to confrontation. Hearing diminished bilaterally Facial sensation intact. Face, tongue, palate moves normally and symmetrically.  Motor: Normal bulk and tone. Normal strength in all tested extremity muscles.  No pill-rolling tremor but mild cogwheel rigidity at the left wrist.  Very mild bradykinesia but able to get up from a chair with arms folded Sensory.: intact to touch ,pinprick .position and vibratory sensation.  Coordination: Rapid alternating movements normal in all extremities. Finger-to-nose and heel-to-shin performed accurately bilaterally. Gait and Station: Arises from chair without difficulty. Stance is normal. Gait demonstrates normal stride length and balance but diminished left arm swing and slight dragging of the left leg.. Unable to heel, toe and tandem walk without difficulty.  Poor response  to postural threat and would fall backwards if not caught.  No festination. Reflexes: 1+  and symmetric. Toes downgoing.       ASSESSMENT: 41 year Caucasian male with 2 episodes in a 3 week period of brief altered consciousness possible Complex partial seizures.  Episodes of falling backwards likely of multifactorial etiology given mild parkinsonian features and degenerative back disease.  He also has restless legs syndrome which is well controlled on Topamax.    PLAN: I had a long discussion with the patient with regards to his gait abnormality and tendency to fall backwards which likely appears to be multifactorial given his combination of degenerative lumbar spine disease, mild or aparkinsonism as well as restless legs.  His bradykinesia and parkinsonian symptoms are not significant enough to justify a trial of medication at the present time.  I recommend he continue Topamax  50mg  at night for restless legs  .  Continue Depakote and the present dose for his complex partial seizures which appear to be stable.  He will return for follow-up in the future in a year or call earlier if necessary. Greater than 50% of time during this 25 minute visit was spent on counseling,explanation of diagnosis of restless legs and parkinsonian variant, planning of further management, discussion with patient and family and coordination of care Antony Contras, MD  [facility] [address] [place], [place] [address]  Phone [phone] Fax [phone] Note: This document was prepared with digital dictation and possible smart phrase technology. Any transcriptional errors that result from this process are unintentional

## 2019-03-03 IMAGING — CR DG LUMBAR SPINE COMPLETE 4+V
5 series · 5 of 5 positions shown · non-contrast
Comparison: 11/07/2015

CLINICAL DATA: Back pain.  Remote falls.

EXAM:
LUMBAR SPINE - COMPLETE 4+ VIEW

[w lumbar spine ap]
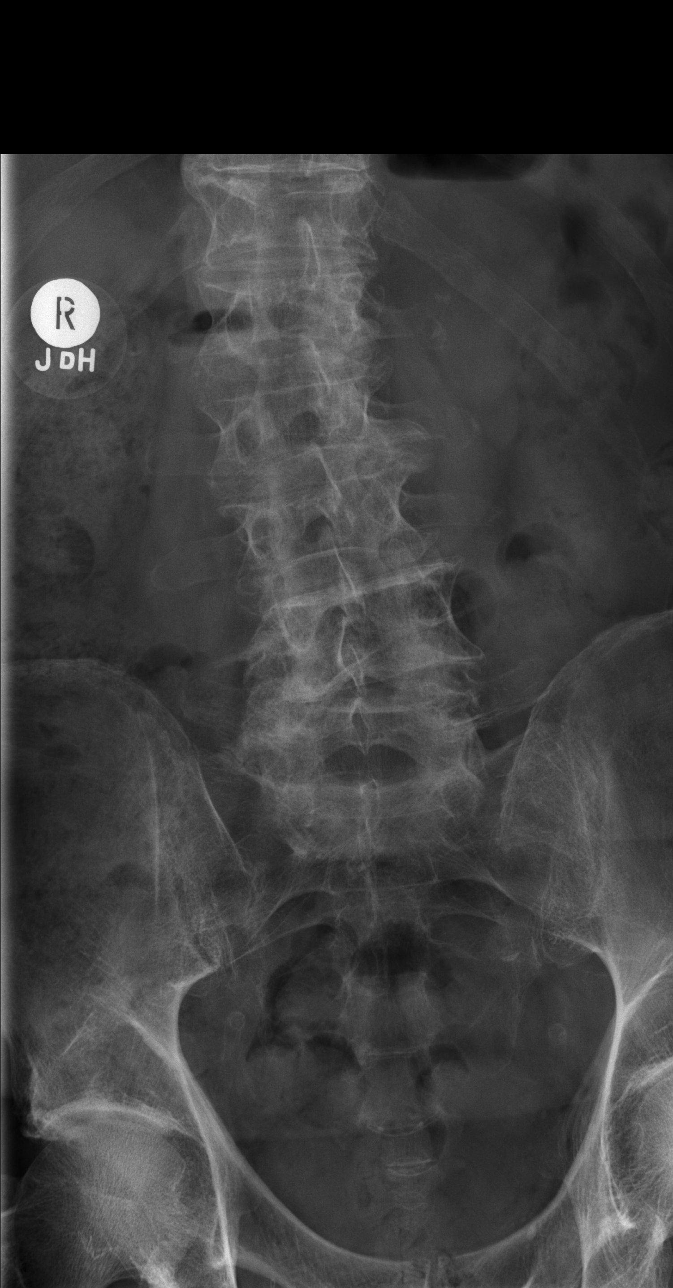

[w lumbar spine obl (1 of 2)]
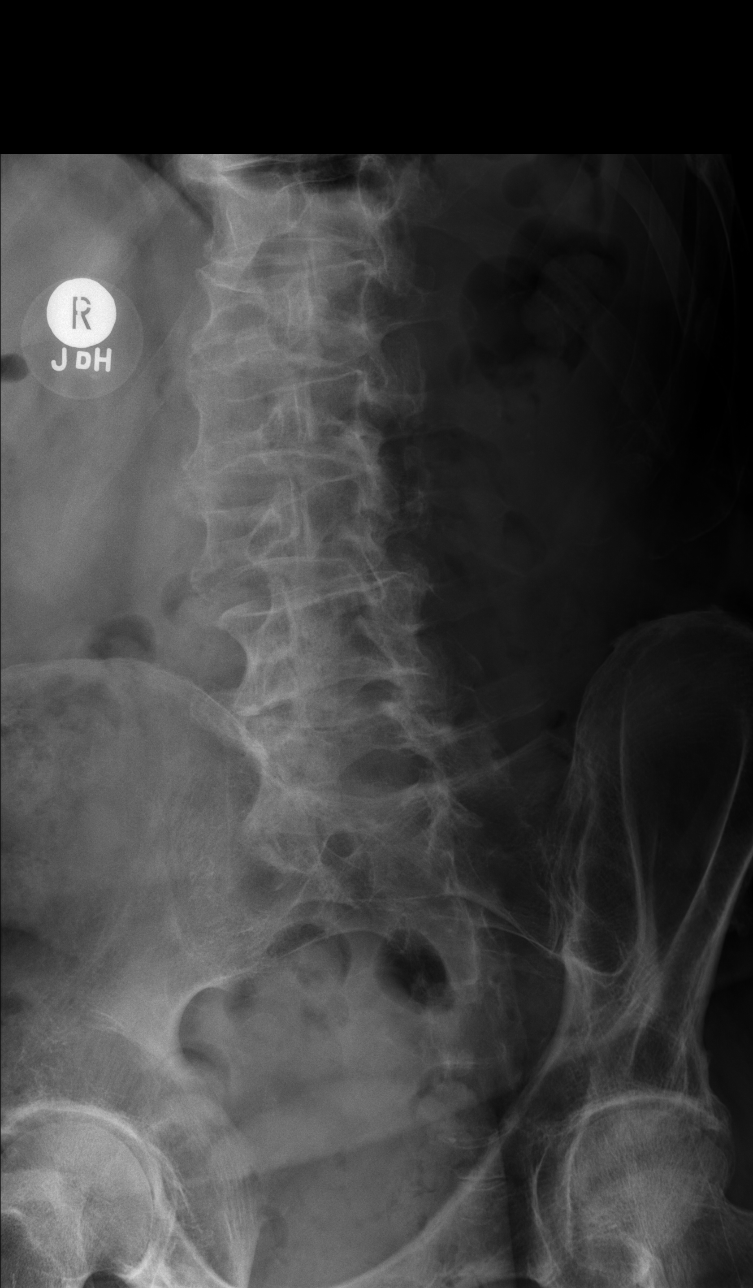

[w lumbar spine obl (2 of 2)]
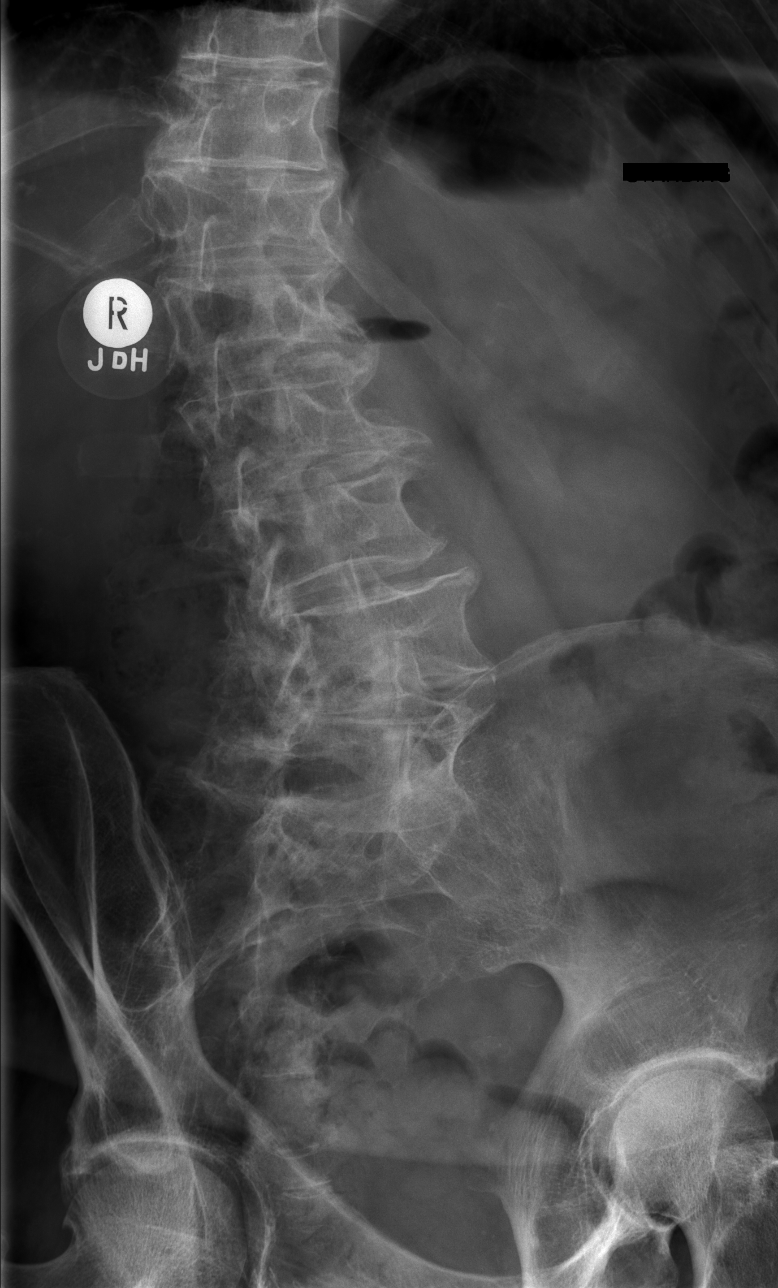

[w lumbar spine lat]
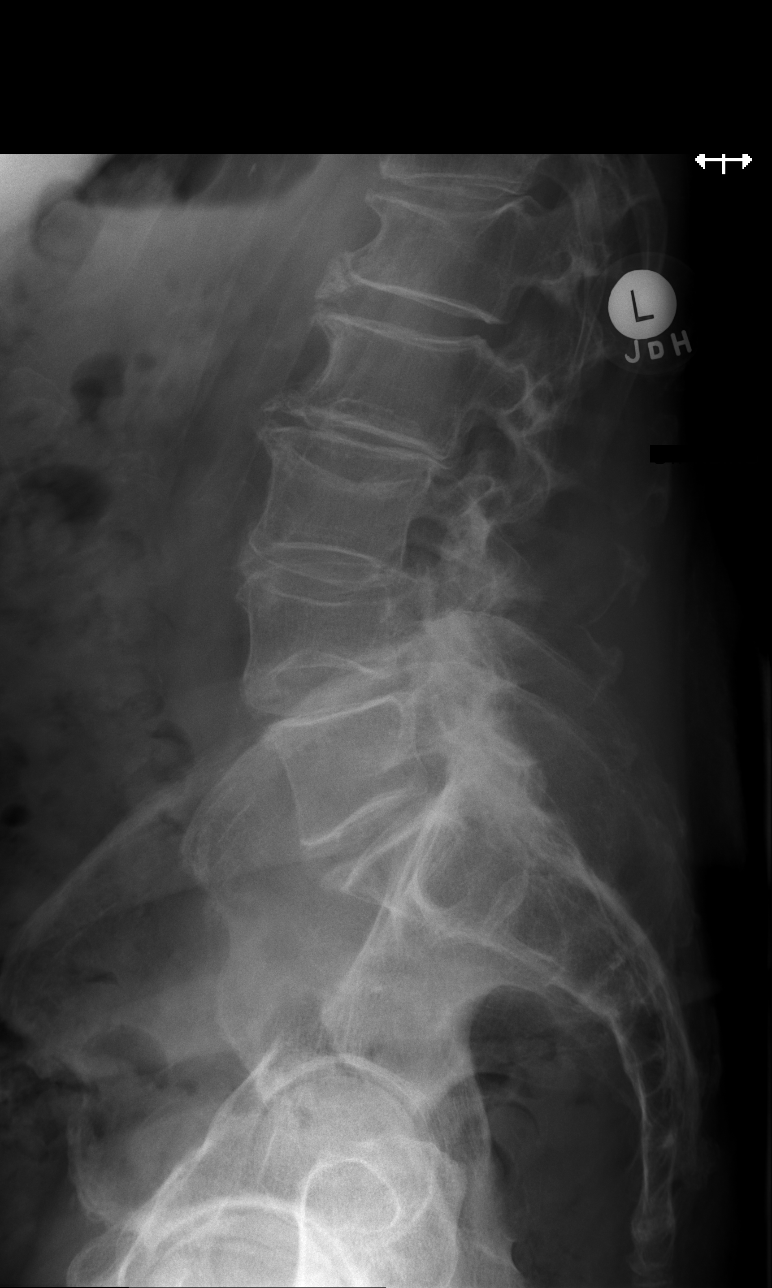

[w lumbar l-5 s-1 spot]
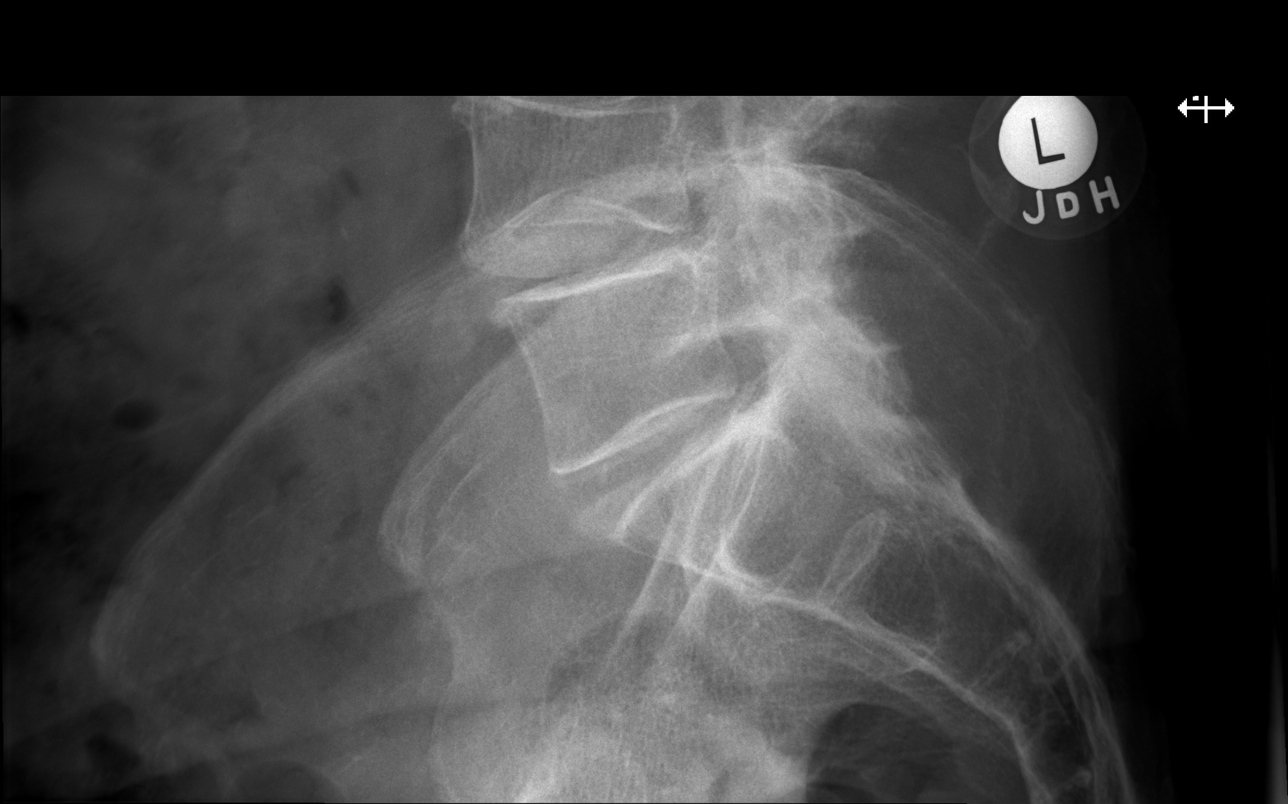

[5 of 5 positions shown; findings below may reference images not displayed]

FINDINGS: Mild curvature in the lumbar spine is again noted. Right-sided disc
space narrowing at L4-L5 is similar. Prominent facet arthropathy
L4-L5 and L5-S1. Stable compression deformities at T12 and L1. Disc
space narrowing at L2-L3. Depression along the L4 inferior endplate
appears new and could represent an age-indeterminate fracture.
IMPRESSION: 1. Age indeterminate compression fracture along the L4 inferior
endplate.
2. Old compression fractures at T12 and L1.
3. Scoliosis with multilevel degenerative disease.

## 2019-03-03 IMAGING — CR DG RIBS 2V*R*
2 series · 2 of 2 positions shown · non-contrast
Comparison: Chest x-ray 03/07/2018, 06/25/2017. Rib series
06/25/2017.

CLINICAL DATA: History of falls.  Rib pain.

EXAM:
RIGHT RIBS - 2 VIEW

[w ribs ap lower right]
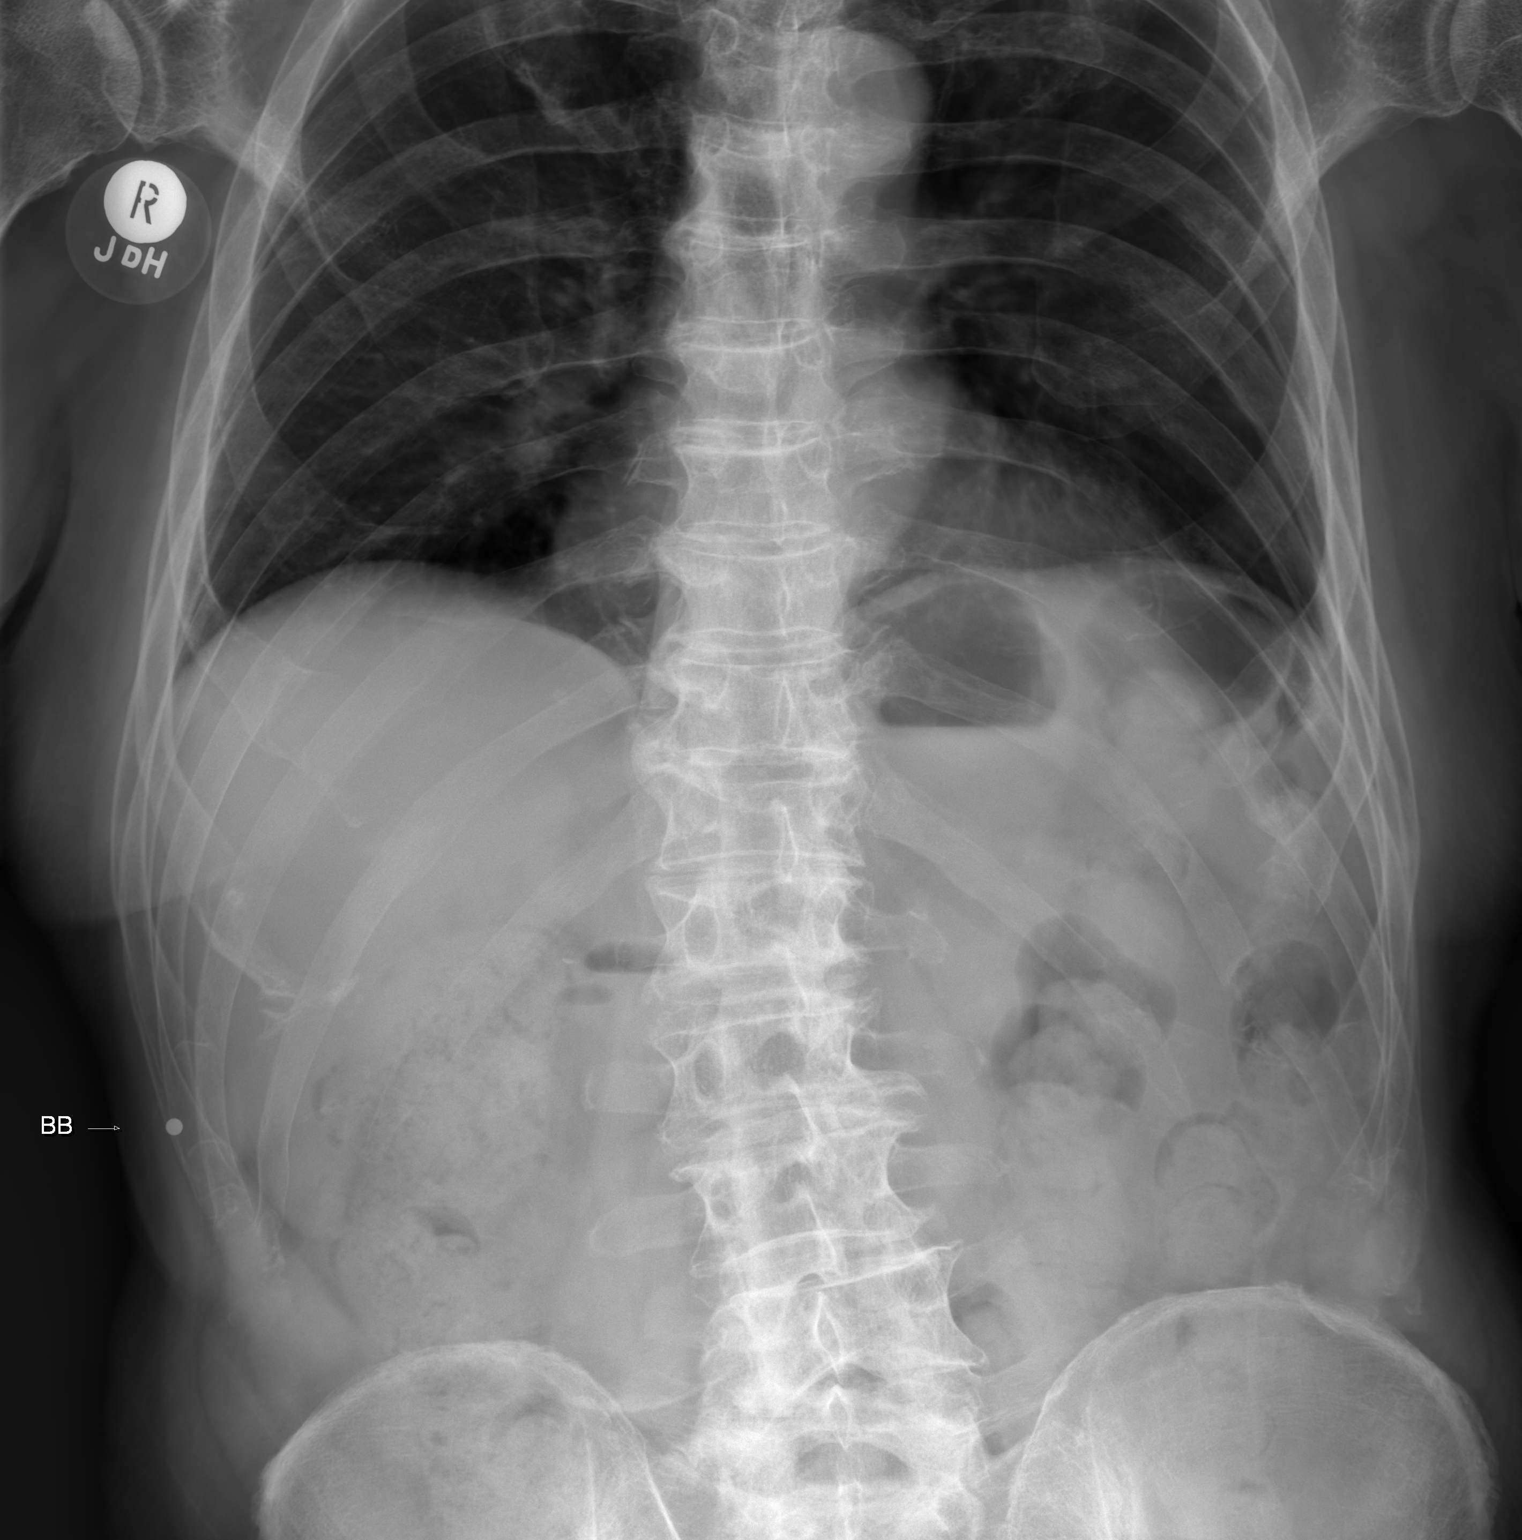

[w ribs obl right]
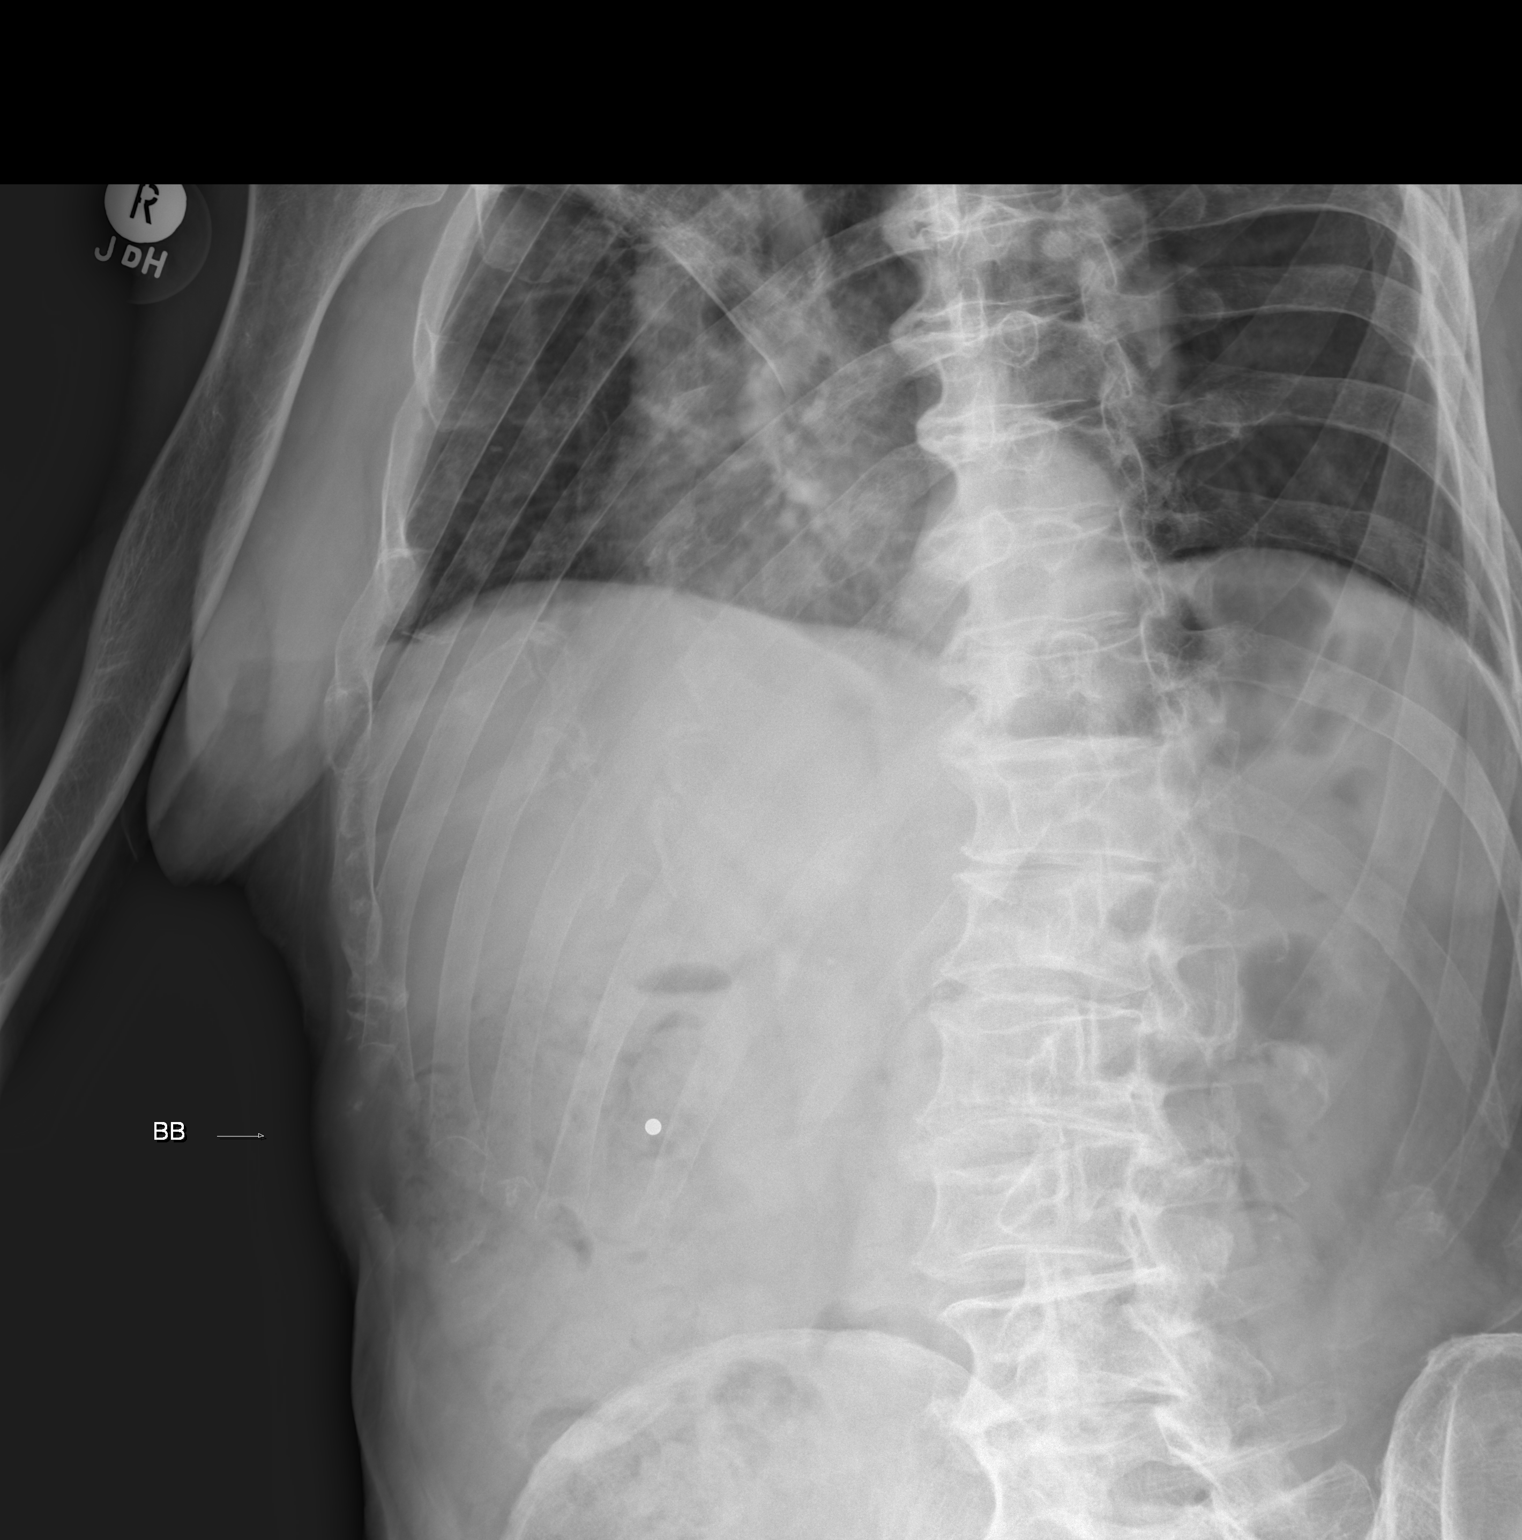

[2 of 2 positions shown; findings below may reference images not displayed]

FINDINGS: No evidence of displaced rib fracture. No pneumothorax. Stable lower
thoracic/upper lumbar compression fractures
IMPRESSION: No acute bony abnormality.

## 2019-07-21 ENCOUNTER — Emergency Department (HOSPITAL_COMMUNITY): Payer: Medicare Other

## 2019-07-21 ENCOUNTER — Emergency Department (HOSPITAL_COMMUNITY)
Admission: EM | Admit: 2019-07-21 | Discharge: 2019-07-21 | Disposition: A | Discharge status: Home / Self Care | Payer: Medicare Other | Attending: Emergency Medicine | Admitting: Emergency Medicine

## 2019-07-21 ENCOUNTER — Other Ambulatory Visit: Payer: Self-pay

## 2019-07-21 DIAGNOSIS — S20222A Contusion of left back wall of thorax, initial encounter: Secondary | ICD-10-CM | POA: Diagnosis not present

## 2019-07-21 DIAGNOSIS — Y92199 Unspecified place in other specified residential institution as the place of occurrence of the external cause: Secondary | ICD-10-CM | POA: Insufficient documentation

## 2019-07-21 DIAGNOSIS — Z8673 Personal history of transient ischemic attack (TIA), and cerebral infarction without residual deficits: Secondary | ICD-10-CM | POA: Diagnosis not present

## 2019-07-21 DIAGNOSIS — Z7982 Long term (current) use of aspirin: Secondary | ICD-10-CM | POA: Insufficient documentation

## 2019-07-21 DIAGNOSIS — S20212A Contusion of left front wall of thorax, initial encounter: Secondary | ICD-10-CM

## 2019-07-21 DIAGNOSIS — Y999 Unspecified external cause status: Secondary | ICD-10-CM | POA: Diagnosis not present

## 2019-07-21 DIAGNOSIS — Y939 Activity, unspecified: Secondary | ICD-10-CM | POA: Diagnosis not present

## 2019-07-21 DIAGNOSIS — W0110XA Fall on same level from slipping, tripping and stumbling with subsequent striking against unspecified object, initial encounter: Secondary | ICD-10-CM | POA: Insufficient documentation

## 2019-07-21 DIAGNOSIS — Z8546 Personal history of malignant neoplasm of prostate: Secondary | ICD-10-CM | POA: Diagnosis not present

## 2019-07-21 DIAGNOSIS — S299XXA Unspecified injury of thorax, initial encounter: Secondary | ICD-10-CM | POA: Diagnosis present

## 2019-07-21 DIAGNOSIS — Z79899 Other long term (current) drug therapy: Secondary | ICD-10-CM | POA: Diagnosis not present

## 2019-07-21 DIAGNOSIS — W19XXXA Unspecified fall, initial encounter: Secondary | ICD-10-CM

## 2019-07-21 LAB — CBC
HCT: 34.4 % — ABNORMAL LOW (ref 39.0–52.0)
Hemoglobin: 11.3 g/dL — ABNORMAL LOW (ref 13.0–17.0)
MCH: 33 pg (ref 26.0–34.0)
MCHC: 32.8 g/dL (ref 30.0–36.0)
MCV: 100.6 fL — ABNORMAL HIGH (ref 80.0–100.0)
Platelets: 287 10*3/uL (ref 150–400)
RBC: 3.42 MIL/uL — ABNORMAL LOW (ref 4.22–5.81)
RDW: 12 % (ref 11.5–15.5)
WBC: 7.3 10*3/uL (ref 4.0–10.5)
nRBC: 0 % (ref 0.0–0.2)

## 2019-07-21 LAB — BASIC METABOLIC PANEL
Anion gap: 5 (ref 5–15)
BUN: 19 mg/dL (ref 8–23)
CO2: 25 mmol/L (ref 22–32)
Calcium: 8.9 mg/dL (ref 8.9–10.3)
Chloride: 107 mmol/L (ref 98–111)
Creatinine, Ser: 0.68 mg/dL (ref 0.61–1.24)
GFR calc Af Amer: >60 mL/min (ref >60)
GFR calc non Af Amer: >60 mL/min (ref >60)
Glucose, Bld: 101 mg/dL — ABNORMAL HIGH (ref 70–99)
Potassium: 3.9 mmol/L (ref 3.5–5.1)
Sodium: 137 mmol/L (ref 135–145)

## 2019-07-21 MED ORDER — MORPHINE SULFATE (PF) 4 MG/ML IV SOLN
4.0000 mg | Freq: Once | INTRAVENOUS | Status: AC
  Administered 2019-07-21: 4 mg via INTRAVENOUS
  Filled 2019-07-21: qty 1

## 2019-07-21 MED ORDER — HYDROCODONE-ACETAMINOPHEN 5-325 MG PO TABS: 1.0000 | ORAL_TABLET | Freq: Four times a day (QID) | ORAL | 0 refills | Status: DC | PRN

## 2019-07-21 NOTE — ED Notes (Signed)
Kenneth Bowen stated they did not need report, as he was on the independent living side.

## 2019-07-21 NOTE — ED Triage Notes (Signed)
86 yo male from [place] ([place]). Pt is on the higher level of care side s/p fall while urinating in [place] this morning. Denies head injury per EMS. No head/neck pain. C/O left flank pain. Pt was able to rollover on his own, walk to his living room and call for help. Denies blood thinners. Abrasions to back per EMS.

## 2019-07-21 NOTE — Discharge Instructions (Signed)
Take the medications as needed for pain,expect to be stiff and sore for the next week or so, follow up with your pain management doctor

## 2019-07-21 NOTE — ED Provider Notes (Signed)
[place] DEPT Provider Note   CSN: [id number] Arrival date & time: 07/21/19  [id number]     History Chief Complaint  Patient presents with  . Fall    Kenneth Bowen is a 86 y.o. male.  HPI   Patient presents to the emergency room for evaluation after a fall.  Patient has a history of balance difficulty.  He uses a walker to help ambulate.  Patient lives in an independent living facility.  He went to go to the bathroom this morning using his walker.  Patient states suddenly he started falling.  He was not able to catch himself any up landing on something sharp.  Patient parents pain in the left flank and rib area.  He is also had a constant burning discomfort.  He was able to get up on his own.  However, when the burning discomfort persisted and did not resolve as he expected he contacted the nurse at the facility.  They suggested he call 911 to be evaluated.  Patient denies any loss of consciousness.  He is not having any chest pain or shortness of breath.  He denies any focal numbness or weakness.  He denies any headache.  No fevers or chills.  Past Medical History:  Diagnosis Date  . Colon polyps   . Diverticulosis   . Prostate cancer [facility]) 2015   radiation finished Nov 2015  . Seizures ([place]) 07/2016    Patient Active Problem List   Diagnosis Date Noted  . Therapeutic drug monitoring 06/10/2017  . Partial symptomatic epilepsy with complex partial seizures, not intractable, without status epilepticus ([place]) 12/05/2016  . Syncope 07/29/2016  . Unresponsiveness 07/28/2016  . BPH (benign prostatic hyperplasia) 07/28/2016  . Dyslipidemia 07/28/2016  . Anxiety   . History of prostate cancer   . TIA (transient ischemic attack) 07/12/2016    Past Surgical History:  Procedure Laterality Date  . colonscopy    . INGUINAL HERNIA REPAIR    . KNEE SURGERY    . PROSTATE BIOPSY         Family History  Problem Relation Age of Onset  . Alcoholism  Father   . Cancer Neg Hx   . Colon cancer Neg Hx     Social History   Tobacco Use  . Smoking status: Never Smoker  . Smokeless tobacco: Never Used  Substance Use Topics  . Alcohol use: Yes    Alcohol/week: 7.0 standard drinks    Types: 7 Glasses of wine per week    Comment: 4 oz nightly  . Drug use: No    Home Medications Prior to Admission medications   Medication Sig Start Date End Date Taking? Authorizing Provider  ALPRAZolam Duanne Moron) 0.5 MG tablet Take 0.5 mg by mouth daily as needed for anxiety. 07/20/19  Yes [provider]  ARTIFICIAL TEAR OP Place 1 drop into both eyes daily as needed. For dry eyes   Yes [provider]  aspirin 81 MG tablet Take 1 tablet (81 mg total) by mouth daily. Over the counter 07/13/16  Yes Rai, Ripudeep K, MD  cholecalciferol (VITAMIN D) 1000 UNITS tablet Take 2,000 Units by mouth daily.    Yes [provider]  desoximetasone (TOPICORT) 0.25 % cream Apply 1 application topically 2 (two) times daily as needed (rash).  06/17/19  Yes [provider]  divalproex (DEPAKOTE ER) 500 MG 24 hr tablet Take 1 tablet (500 mg total) by mouth daily. 02/16/19  Yes Garvin Fila, MD  furosemide (LASIX) 20 MG tablet Take 20 mg by mouth daily.  03/06/18  Yes [provider]  hydrocortisone 2.5 % cream Apply 1 application topically daily as needed for other. 06/23/19  Yes [provider]  IRON PO Take 1 tablet by mouth See admin instructions. Take couple times a week per patient. No specific days   Yes [provider]  mometasone (ELOCON) 0.1 % ointment Apply 1 application topically 2 (two) times daily as needed (rash).  06/23/19  Yes [provider]  Multiple Vitamin (MULTIVITAMIN) tablet Take 1 tablet by mouth daily.   Yes [provider]  omeprazole (PRILOSEC) 20 MG capsule Take 20 mg by mouth daily as needed (acid reflux).  12/29/18  Yes [provider]  tamsulosin (FLOMAX) 0.4 MG CAPS  capsule TAKE 1 CAPSULE (0.4 MG TOTAL) BY MOUTH DAILY AFTER SUPPER. 03/21/16  Yes Tyler Pita, MD  topiramate (TOPAMAX) 25 MG tablet 1 TAB AT BEDTIME AS NEED MAY REPEAT DOSE 1 TIME IF NEED START 25MG  AT BEDTIME X3DAY THEN 50MG  Patient taking differently: Take 25 mg by mouth at bedtime.  02/16/19  Yes Garvin Fila, MD  traMADol (ULTRAM) 50 MG tablet Take 50 mg by mouth every 6 (six) hours as needed for pain. 07/17/19  Yes [provider]  traZODone (DESYREL) 50 MG tablet Take 25 mg by mouth at bedtime.  06/18/16  Yes [provider]  triamcinolone cream (KENALOG) 0.1 % Apply 1 application topically 2 (two) times daily as needed (rash).  06/23/19  Yes [provider]  HYDROcodone-acetaminophen (NORCO/VICODIN) 5-325 MG tablet Take 1 tablet by mouth every 6 (six) hours as needed. 07/21/19   Dorie Rank, MD    Allergies    Patient has no known allergies.  Review of Systems   Review of Systems  All other systems reviewed and are negative.   Physical Exam Updated Vital Signs BP (!) 143/71 (BP Location: Left Arm)   Pulse 62   Temp 97.6 F (36.4 C) (Oral)   Resp 18   Ht 1.778 m (5\' 10" )   Wt 81.6 kg   SpO2 95%   BMI 25.83 kg/m   Physical Exam Vitals and nursing note reviewed.  Constitutional:      General: He is not in acute distress.    Appearance: He is well-developed.  HENT:     Head: Normocephalic and atraumatic.     Right Ear: External ear normal.     Left Ear: External ear normal.  Eyes:     General: No scleral icterus.       Right eye: No discharge.        Left eye: No discharge.     Conjunctiva/sclera: Conjunctivae normal.  Neck:     Trachea: No tracheal deviation.  Cardiovascular:     Rate and Rhythm: Normal rate and regular rhythm.  Pulmonary:     Effort: Pulmonary effort is normal. No respiratory distress.     Breath sounds: Normal breath sounds. No stridor. No wheezing or rales.  Abdominal:     General: Bowel sounds are normal. There is no  distension.     Palpations: Abdomen is soft.     Tenderness: There is no abdominal tenderness. There is no guarding or rebound.  Musculoskeletal:        General: No tenderness.     Cervical back: Neck supple.     Comments: Superficial abrasion left flank, no ecchymoses or laceration, tenderness to palpation posterior inferior left rib margin,  tenderness palpation in proximal lumbar and distal thoracic spine area  Skin:    General: Skin is warm and dry.     Findings: No rash.  Neurological:     Mental Status: He is alert.     Cranial Nerves: No cranial nerve deficit (no facial droop, extraocular movements intact, no slurred speech).     Sensory: No sensory deficit.     Motor: No abnormal muscle tone or seizure activity.     Coordination: Coordination normal.     Comments: No pronator drift, able to lift both legs off the bed, normal sensation throughout, equal grip strength bilaterally, no facial droop, extraocular movements intact, normal sensation throughout     ED Results / Procedures / Treatments   Labs (all labs ordered are listed, but only abnormal results are displayed) Labs Reviewed  CBC - Abnormal; Notable for the following components:      Result Value   RBC 3.42 (*)    Hemoglobin 11.3 (*)    HCT 34.4 (*)    MCV 100.6 (*)    All other components within normal limits  BASIC METABOLIC PANEL - Abnormal; Notable for the following components:   Glucose, Bld 101 (*)    All other components within normal limits    EKG EKG Interpretation  Date/Time:  Tuesday July 21 2019 09:51:38 EST Ventricular Rate:  63 PR Interval:    QRS Duration: 93 QT Interval:  391 QTC Calculation: 401 R Axis:   58 Text Interpretation: Sinus rhythm Prolonged PR interval , new since last tracing Confirmed by Dorie Rank [phone]) on 07/21/2019 9:56:56 AM   Radiology DG Ribs Unilateral W/Chest Left  Result Date: 07/21/2019 CLINICAL DATA:  Fall, posterior left-sided rib pain EXAM: LEFT RIBS AND  CHEST - 3+ VIEW COMPARISON:  03/07/2018 FINDINGS: Chronic nondisplaced fractures of the posterior left eleventh and tenth ribs. No acute fracture or other bone lesions are seen involving the ribs. There is no evidence of pneumothorax or pleural effusion. Both lungs are clear. Heart size and mediastinal contours are within normal limits. IMPRESSION: Chronic nondisplaced fractures of the posterior left eleventh and tenth ribs. No acute left-sided rib fracture. Electronically Signed   By: Davina Poke D.O.   On: 07/21/2019 11:20   DG Thoracic Spine 2 View  Result Date: 07/21/2019 CLINICAL DATA:  [place], left lower rib pain EXAM: THORACIC SPINE 2 VIEWS COMPARISON:  03/07/2018, 04/02/2018 FINDINGS: Frontal and lateral views of the thoracic spine are obtained. Chronic anterior wedging of the T12 vertebral body. No acute thoracic spine fractures. Evaluation of the cervicothoracic junction limited due to overlying structures. Alignment is stable. Paraspinal soft tissues are normal. IMPRESSION: 1. Stable exam, no acute process. Electronically Signed   By: Randa Ngo M.D.   On: 07/21/2019 11:21   DG Lumbar Spine Complete  Result Date: 07/21/2019 CLINICAL DATA:  [place], lower back pain EXAM: LUMBAR SPINE - COMPLETE 4+ VIEW COMPARISON:  03/07/2018, 04/02/2018 FINDINGS: Frontal, bilateral oblique, lateral views of the lumbar spine are obtained. There is chronic anterior wedging of the T12 vertebral body. Chronic invagination of the superior endplates at L1 and L3, and the inferior endplate of L4. Since the prior exam, invagination of the superior endplate of L2 has developed, otherwise age indeterminate. There is extensive facet hypertrophic change, greatest from L4 through S1, stable since prior CT. There is diffuse spondylosis with disc space narrowing and osteophyte formation, greatest at L1/L2, L2/L3, and L5/S1. These are stable findings. Sacroiliac joints appear normal. IMPRESSION:  1. Interval development of  invagination of the superior endplate of L2, otherwise age indeterminate. 2. Chronic changes throughout the remainder of the lumbar spine. Electronically Signed   By: Randa Ngo M.D.   On: 07/21/2019 11:20    Procedures Procedures (including critical care time)  Medications Ordered in ED Medications  morphine 4 MG/ML injection 4 mg (4 mg Intravenous Given 07/21/19 1005)    ED Course  I have reviewed the triage vital signs and the nursing notes.  Pertinent labs & imaging results that were available during my care of the patient were reviewed by me and considered in my medical decision making (see chart for details).    MDM Rules/Calculators/A&P                      Patient presented to the emergency room for evaluation after a fall.  Patient is not exactly sure why he fell but he does have balance issues.  He did not lose consciousness and it does not sound like he had a syncopal episode.  He does not have any focal neurologic exams to suggest stroke or TIA.  Laboratory tests are reassuring.  Patient has been hemodynamically stable.  X-rays do not show any signs of acute injuries.  Patient be discharged home with prescription for hydrocodone.  I discussed the dangers of opiate medications however the patient has been taking Ultram when he does not think that medication has been effective.  Recommend he follow-up with his pain management doctor Final Clinical Impression(s) / ED Diagnoses Final diagnoses:  Fall, initial encounter  Contusion of left chest wall, initial encounter    Rx / DC Orders ED Discharge Orders         Ordered    HYDROcodone-acetaminophen (NORCO/VICODIN) 5-325 MG tablet  Every 6 hours PRN     07/21/19 1325           Dorie Rank, MD 07/21/19 1327

## 2019-07-21 NOTE — ED Notes (Signed)
[facility] called for transport.  

## 2019-09-03 ENCOUNTER — Other Ambulatory Visit: Payer: Self-pay

## 2019-09-03 ENCOUNTER — Ambulatory Visit (INDEPENDENT_AMBULATORY_CARE_PROVIDER_SITE_OTHER): Payer: Medicare Other | Admitting: Neurology

## 2019-09-03 VITALS — BP 104/66 | HR 58 | Temp 97.0°F | Ht 70.0 in | Wt 178.4 lb

## 2019-09-03 DIAGNOSIS — G2581 Restless legs syndrome: Secondary | ICD-10-CM | POA: Diagnosis not present

## 2019-09-03 DIAGNOSIS — R269 Unspecified abnormalities of gait and mobility: Secondary | ICD-10-CM | POA: Diagnosis not present

## 2019-09-03 DIAGNOSIS — W19XXXD Unspecified fall, subsequent encounter: Secondary | ICD-10-CM

## 2019-09-03 NOTE — Patient Instructions (Signed)
I had a long discussion with the patient with regards to his gait abnormality and tendency to fall backwards which likely appears to be multifactorial given his combination of degenerative lumbar spine disease, mild parkinsonism as well as restless legs.  His bradykinesia and parkinsonian symptoms continue to not be significant enough to justify a trial of medication at the present time. I recommend he continue Topamax  50mg  at night for restless legs  . Continue Depakote and the present dose for his complex partial seizures which appear to be stable.  He was advised to continue outpatient physical occupational therapy for improving his gait and posture and to use his walker at all times.  We also discussed fall and safety precautions He will return for follow-up in the future in 6 months with my nurse practitioner Janett Billow or call earlier if necessary.  Fall Prevention in the Home, Adult Falls can cause injuries. They can happen to people of all ages. There are many things you can do to make your home safe and to help prevent falls. Ask for help when making these changes, if needed. What actions can I take to prevent falls? General Instructions  Use good lighting in all rooms. Replace any light bulbs that burn out.  Turn on the lights when you go into a dark area. Use night-lights.  Keep items that you use often in easy-to-reach places. Lower the shelves around your home if necessary.  Set up your furniture so you have a clear path. Avoid moving your furniture around.  Do not have throw rugs and other things on the floor that can make you trip.  Avoid walking on wet floors.  If any of your floors are uneven, fix them.  Add color or contrast paint or tape to clearly mark and help you see: ? Any grab bars or handrails. ? First and last steps of stairways. ? Where the edge of each step is.  If you use a stepladder: ? Make sure that it is fully opened. Do not climb a closed  stepladder. ? Make sure that both sides of the stepladder are locked into place. ? Ask someone to hold the stepladder for you while you use it.  If there are any pets around you, be aware of where they are. What can I do in the bathroom?      Keep the floor dry. Clean up any water that spills onto the floor as soon as it happens.  Remove soap buildup in the tub or shower regularly.  Use non-skid mats or decals on the floor of the tub or shower.  Attach bath mats securely with double-sided, non-slip rug tape.  If you need to sit down in the shower, use a plastic, non-slip stool.  Install grab bars by the toilet and in the tub and shower. Do not use towel bars as grab bars. What can I do in the bedroom?  Make sure that you have a light by your bed that is easy to reach.  Do not use any sheets or blankets that are too big for your bed. They should not hang down onto the floor.  Have a firm chair that has side arms. You can use this for support while you get dressed. What can I do in the kitchen?  Clean up any spills right away.  If you need to reach something above you, use a strong step stool that has a grab bar.  Keep electrical cords out of the way.  Do  not use floor polish or wax that makes floors slippery. If you must use wax, use non-skid floor wax. What can I do with my stairs?  Do not leave any items on the stairs.  Make sure that you have a light switch at the top of the stairs and the bottom of the stairs. If you do not have them, ask someone to add them for you.  Make sure that there are handrails on both sides of the stairs, and use them. Fix handrails that are broken or loose. Make sure that handrails are as long as the stairways.  Install non-slip stair treads on all stairs in your home.  Avoid having throw rugs at the top or bottom of the stairs. If you do have throw rugs, attach them to the floor with carpet tape.  Choose a carpet that does not hide the  edge of the steps on the stairway.  Check any carpeting to make sure that it is firmly attached to the stairs. Fix any carpet that is loose or worn. What can I do on the outside of my home?  Use bright outdoor lighting.  Regularly fix the edges of walkways and driveways and fix any cracks.  Remove anything that might make you trip as you walk through a door, such as a raised step or threshold.  Trim any bushes or trees on the path to your home.  Regularly check to see if handrails are loose or broken. Make sure that both sides of any steps have handrails.  Install guardrails along the edges of any raised decks and porches.  Clear walking paths of anything that might make someone trip, such as tools or rocks.  Have any leaves, snow, or ice cleared regularly.  Use sand or salt on walking paths during winter.  Clean up any spills in your garage right away. This includes grease or oil spills. What other actions can I take?  Wear shoes that: ? Have a low heel. Do not wear high heels. ? Have rubber bottoms. ? Are comfortable and fit you well. ? Are closed at the toe. Do not wear open-toe sandals.  Use tools that help you move around (mobility aids) if they are needed. These include: ? Canes. ? Walkers. ? Scooters. ? Crutches.  Review your medicines with your doctor. Some medicines can make you feel dizzy. This can increase your chance of falling. Ask your doctor what other things you can do to help prevent falls. Where to find more information  Centers for Disease Control and Prevention, [facility]: https://garcia.biz/  [employer] on Aging: BrainJudge.co.uk Contact a doctor if:  You are afraid of falling at home.  You feel weak, drowsy, or dizzy at home.  You fall at home. Summary  There are many simple things that you can do to make your home safe and to help prevent falls.  Ways to make your home safe include removing tripping hazards and installing grab  bars in the bathroom.  Ask for help when making these changes in your home. This information is not intended to replace advice given to you by your health care provider. Make sure you discuss any questions you have with your health care provider. Document Revised: 08/21/2018 Document Reviewed: 12/13/2016 Elsevier Patient Education  2020 [employer].

## 2019-09-03 NOTE — Progress Notes (Signed)
[facility] [address] [place]. [place] [address] [phone]       OFFICE FOLLOW-UP NOTE  Kenneth. Kenneth Bowen Date of Birth:  1930-05-22 Medical Record Number:  [id number]   HPI: Initial visit 02/10/2018 Kenneth Bowen is a 86 year Caucasian male seen today for first office follow-up visit for in-hospital with admission in March 2018 for episode of brief loss of awareness. History is obtained from the patient, review of electronic medical records and have personally reviewed imaging for films. Kenneth Elzey Basingeris an 86 y.o.malewho was  at home earlier and wife saw him standing up against the wall unresponsive, staring and had a bowel movement.    He was able to share what his wife described as another similar episodes  Two weeks ago, patient was leaning over to pick up something and says he may have strained to reach down to get the item.  He says his head was "all the way down."  When he sat up, he apparently became dizzy and had blurred vision.  He then became confused and his wife told him that for 20 minutes he "reached for things that were not there and acted confused."  This lasted for 20 minutes and he returned to normal by the time EMS arrived.  The second event occurred prior to admission.  Patient was sitting upright and did not have a positional change or strain to reach for something.  He had sudden onset of dizziness.  Like the time two weeks before, his wife assisted him to another room but he was confused again.  This time the patient did not "return to clear understanding" until he was in the ambulance.  This was approximately 30-40 minutes after onset.  When asked if this has happened before, patient recalled an event 10 years prior when he suddenly lost his words for a few minutes and an event 15 years prior when he had trouble understanding the words he was reading.  However, no confusion spells of note. CT scan of the brain on admission was unremarkable and CT  angiogram showed no large vessel stenosis or occlusion. EEG was obtained which showed no definite seizure activity. Transthoracic echo showed normal ejection fraction. LDL cholesterol was 55 mg percent. Hemoglobin A1c was 5.8. Patient was started on a trial of Depakote ER 500 mg daily. He states he tolerated well but he ran out of a week ago has not filled it yet. He complained of increased frequency of soft stools about 3 times a day he felt this may be related to Depakote. He has had no further recurrent episodes. Denies any prior history of significant head injury with loss of consciousness, childhood epilepsy or other seizures. Update 04/28/2018 : He returns for follow-up after last visit 2-1/2 months ago.  He had unexplained fall backwards when he was getting up quickly.  He saw his primary physician Dr. Nyoka Cowden who recommended he see me for further evaluation.  He states that he has had several such falls following his initial fall in 2017 when he fell and sustained compression fracture of 2 vertebrae in the lumbar spine.  Since then he is learned to get up slowly but most of his falls have been backwards.  He denies any passing out or severe pain leading to the fall.  Patient was seen last week in our office in September 2019 at that time he had comprehensive metabolic panel as well as CBC drawn which were unremarkable.  He remains on Depakote  ER 500 daily and has had no further episodes of seizure-like events.  He is been bothered by swelling in his feet and Dr. Nyoka Cowden feels this may be related to Depakote.  He has been started on Lasix 20 mg but so far has not shown significant improvement.  He denies any weight gain, tiredness, tremors or dizziness.  Patient denies any leg pain, tingling and numbness.  He does have a feeling of restlessness particularly at night and had trouble sleeping but since he has been taking Xanax 1 mg at night as well as trazodone 50 mg the symptoms are under control.  He has not  tried other medications for restless legs like Topamax, gabapentin, Requip or Lyrica.  He denies any tremors in his hands, drooling of saliva, ,bradykinesia, festination no stooped posture.  He still has chronic back pain and his walking is affected by that and he has to favor his back while getting up and sitting down.  He denies radicular pain or numbness. Update 02/16/2019 : He returns for follow-up after last visit 10 months ago.  He states is doing well.  Continues to have gait and balance difficulties with tendency to fall backwards.  He is at only one fall as he is quite careful with no major injuries.  Did undergo MRI scan of the brain on 05/12/2018 which I personally reviewed shows changes of generalized cerebral atrophy as well as changes of chronic microvascular disease.  No significant change compared with previous MRI from 07/18/2016.  The patient states he is tolerating Topamax 50 mg at bedtime quite well and seems to have helped his restless leg syndrome.  He still however needs to take Xanax as it helps him sleep.  He denies significant tremor or bradykinesia with drooling of saliva.  He feels overall his gait balance and difficulty is about unchanged  Update 09/03/2019: He returns for follow-up after last visit 6 months ago.  He continues to have frequent falls and has had compression fractures of his spine.  He saw Dr. Gildardo Griffes from [facility] who did some x-rays and referred him to Dr. Nelva Bush who did MRI scan lumbar spine on 07/16/2019 which shows acute L5 compression fracture as well as multilevel chronic degenerative changes and compression fractures at T11 and L3 with moderate to severe right L4-5 neural foraminal stenosis and moderate to severe left L3-L4 foraminal stenosis.  Patient has been referred to physical therapy and states he has benefited and is able to pull his shoulders back and look up better and walk with his feet more apart.  He states he has been using a wheeled walker and has  not had any falls in the last 3 weeks.  He does not have any resting or action tremor or rigidity.  He states his restless legs are well controlled on the current dose of Topamax at night.  He is also had no episodes of altered awareness or seizure-like activity since he started taking Depakote several years ago.  He has no new complaints. ROS:   14 system review of systems is positive for imbalance, falling backwards, gait difficulties, compression fracture of this spine only all other systems negative  PMH:  Past Medical History:  Diagnosis Date  . Colon polyps   . Diverticulosis   . Prostate cancer [facility]) 2015   radiation finished Nov 2015  . Seizures ([place]) 07/2016    Social History:  Social History   Socioeconomic History  . Marital status: Married    Spouse  name: Not on file  . Number of children: Not on file  . Years of education: Not on file  . Highest education level: Not on file  Occupational History  . Occupation: civil Chief Financial Officer  Tobacco Use  . Smoking status: Never Smoker  . Smokeless tobacco: Never Used  Substance and Sexual Activity  . Alcohol use: Yes    Alcohol/week: 7.0 standard drinks    Types: 7 Glasses of wine per week    Comment: 4 oz nightly  . Drug use: No  . Sexual activity: Yes  Other Topics Concern  . Not on file  Social History Narrative   Lives with wife at [facility] in [place].  Uses cane.     Social Determinants of Health   Financial Resource Strain:   . Difficulty of Paying Living Expenses:   Food Insecurity:   . Worried About Charity fundraiser in the Last Year:   . Arboriculturist in the Last Year:   Transportation Needs:   . Film/video editor (Medical):   Marland Kitchen Lack of Transportation (Non-Medical):   Physical Activity:   . Days of Exercise per Week:   . Minutes of Exercise per Session:   Stress:   . Feeling of Stress :   Social Connections:   . Frequency of Communication with Friends and Family:   . Frequency  of Social Gatherings with Friends and Family:   . Attends Religious Services:   . Active Member of Clubs or Organizations:   . Attends Archivist Meetings:   Marland Kitchen Marital Status:   Intimate Partner Violence:   . Fear of Current or Ex-Partner:   . Emotionally Abused:   Marland Kitchen Physically Abused:   . Sexually Abused:     Medications:   Current Outpatient Medications on File Prior to Visit  Medication Sig Dispense Refill  . ALPRAZolam (XANAX) 0.5 MG tablet Take 0.5 mg by mouth daily as needed for anxiety.    . ARTIFICIAL TEAR OP Place 1 drop into both eyes daily as needed. For dry eyes    . aspirin 81 MG tablet Take 1 tablet (81 mg total) by mouth daily. Over the counter 30 tablet 3  . cholecalciferol (VITAMIN D) 1000 UNITS tablet Take 2,000 Units by mouth daily.     Marland Kitchen desoximetasone (TOPICORT) 0.25 % cream Apply 1 application topically 2 (two) times daily as needed (rash).     . divalproex (DEPAKOTE ER) 500 MG 24 hr tablet Take 1 tablet (500 mg total) by mouth daily. 90 tablet 3  . furosemide (LASIX) 20 MG tablet Take 20 mg by mouth daily.     . hydrocortisone 2.5 % cream Apply 1 application topically daily as needed for other.    . mometasone (ELOCON) 0.1 % ointment Apply 1 application topically 2 (two) times daily as needed (rash).     . Multiple Vitamin (MULTIVITAMIN) tablet Take 1 tablet by mouth daily.    Marland Kitchen omeprazole (PRILOSEC) 20 MG capsule Take 20 mg by mouth daily as needed (acid reflux).     . tamsulosin (FLOMAX) 0.4 MG CAPS capsule TAKE 1 CAPSULE (0.4 MG TOTAL) BY MOUTH DAILY AFTER SUPPER. 30 capsule 5  . topiramate (TOPAMAX) 25 MG tablet 1 TAB AT BEDTIME AS NEED MAY REPEAT DOSE 1 TIME IF NEED START 25MG  AT BEDTIME X3DAY THEN 50MG  (Patient taking differently: Take 25 mg by mouth at bedtime. Takes two at bedtime) 360 tablet 2  . traZODone (DESYREL) 50 MG  tablet Take 25 mg by mouth at bedtime.     . triamcinolone cream (KENALOG) 0.1 % Apply 1 application topically 2 (two) times  daily as needed (rash).      No current facility-administered medications on file prior to visit.    Allergies:  No Known Allergies  Physical Exam General: Frail elderly Caucasian male, seated, in no evident distress Head: head normocephalic and atraumatic.  Neck: supple with no carotid or supraclavicular bruits Cardiovascular: regular rate and rhythm, no murmurs Musculoskeletal: Marked kyphosis Skin:  no rash/petichiae Vascular:  Normal pulses all extremities Vitals:   09/03/19 1053  BP: 104/66  Pulse: (!) 58  Temp: (!) 97 F (36.1 C)   Neurologic Exam Mental Status: Awake and fully alert. Oriented to place and time. Recent and remote memory intact. Attention span, concentration and fund of knowledge appropriate. Mood and affect appropriate.  Diminished facial expression.  Positive glabellar tap. Cranial Nerves: Fundoscopic exam not done. Pupils equal, briskly reactive to light. Extraocular movements full without nystagmus. Visual fields full to confrontation. Hearing diminished bilaterally Facial sensation intact. Face, tongue, palate moves normally and symmetrically.  Motor: Normal bulk and tone. Normal strength in all tested extremity muscles.  No pill-rolling tremor but mild cogwheel rigidity at the left wrist.  Very mild bradykinesia but able to get up from a chair with arms folded Sensory.: intact to touch ,pinprick .position and vibratory sensation.  Coordination: Rapid alternating movements normal in all extremities. Finger-to-nose and heel-to-shin performed accurately bilaterally. Gait and Station: Arises from chair without difficulty. Stance is markedly stooped l. Gait demonstrates short steps with mild festination and balance but diminished left arm swing and slight dragging of the left leg.. Unable to heel, toe and tandem walk without difficulty.  Poor response to postural threat and would fall backwards if not caught.  No festination. Reflexes: 1+ and symmetric. Toes  downgoing.       ASSESSMENT: 66 year Caucasian male with 2 episodes in a 3 week period of brief altered consciousness possible Complex partial seizures.  Episodes of falling backwards likely of multifactorial etiology given mild parkinsonian features and degenerative back disease.  He also has restless legs syndrome which is well controlled on Topamax.    PLAN: I had a long discussion with the patient with regards to his gait abnormality and tendency to fall backwards which likely appears to be multifactorial given his combination of degenerative lumbar spine disease, mild parkinsonism as well as restless legs.  His bradykinesia and parkinsonian symptoms continue to not be significant enough to justify a trial of medication at the present time. I recommend he continue Topamax  50mg  at night for restless legs  . Continue Depakote and the present dose for his complex partial seizures which appear to be stable.  He was advised to continue outpatient physical occupational therapy for improving his gait and posture and to use his walker at all times.  We also discussed fall and safety precautions He will return for follow-up in the future in 6 months with my nurse practitioner Janett Billow or call earlier if necessary. Greater than 50% of time during this 30 minute visit was spent on counseling,explanation of diagnosis of restless legs and parkinsonian variant, planning of further management, discussion with patient and family and coordination of care Antony Contras, MD  [facility] [address] [place], [place] [address]  Phone [phone] Fax [phone] Note: This document was prepared with digital dictation and possible smart phrase technology. Any transcriptional errors  that result from this process are unintentional

## 2020-03-07 ENCOUNTER — Ambulatory Visit: Payer: Medicare Other | Admitting: Adult Health

## 2020-03-25 ENCOUNTER — Other Ambulatory Visit: Payer: Self-pay | Admitting: Neurology

## 2020-04-29 ENCOUNTER — Other Ambulatory Visit: Payer: Self-pay | Admitting: Neurology

## 2020-06-21 ENCOUNTER — Other Ambulatory Visit: Payer: Self-pay

## 2020-06-21 ENCOUNTER — Other Ambulatory Visit (HOSPITAL_COMMUNITY): Payer: Self-pay | Admitting: Internal Medicine

## 2020-06-21 ENCOUNTER — Encounter (HOSPITAL_COMMUNITY): Payer: Self-pay

## 2020-06-21 ENCOUNTER — Ambulatory Visit (HOSPITAL_COMMUNITY)
Admission: RE | Admit: 2020-06-21 | Discharge: 2020-06-21 | Disposition: A | Discharge status: Home / Self Care | Payer: Medicare Other | Source: Ambulatory Visit | Attending: Cardiovascular Disease | Admitting: Cardiovascular Disease

## 2020-06-21 DIAGNOSIS — M7989 Other specified soft tissue disorders: Secondary | ICD-10-CM | POA: Insufficient documentation

## 2020-06-21 DIAGNOSIS — M79661 Pain in right lower leg: Secondary | ICD-10-CM | POA: Diagnosis present

## 2020-06-21 NOTE — Progress Notes (Unsigned)
Bilateral lower venous has been completed and is POSITIVE for DVT in the right femoral, popliteal and tibial arteries.  Patients physician was notified. Preliminary results can be found under CV proc through chart review.  Wilkie Aye RVT [facility]

## 2020-07-04 ENCOUNTER — Encounter: Payer: Self-pay | Admitting: Adult Health

## 2020-07-04 ENCOUNTER — Ambulatory Visit (INDEPENDENT_AMBULATORY_CARE_PROVIDER_SITE_OTHER): Payer: Medicare Other | Admitting: Adult Health

## 2020-07-04 VITALS — BP 126/68 | HR 58 | Ht 70.0 in | Wt 177.0 lb

## 2020-07-04 DIAGNOSIS — R259 Unspecified abnormal involuntary movements: Secondary | ICD-10-CM

## 2020-07-04 DIAGNOSIS — G40209 Localization-related (focal) (partial) symptomatic epilepsy and epileptic syndromes with complex partial seizures, not intractable, without status epilepticus: Secondary | ICD-10-CM

## 2020-07-04 DIAGNOSIS — G2581 Restless legs syndrome: Secondary | ICD-10-CM

## 2020-07-04 MED ORDER — DIVALPROEX SODIUM ER 500 MG PO TB24: 500.0000 mg | ORAL_TABLET | Freq: Every day | ORAL | 3 refills | Status: DC

## 2020-07-04 MED ORDER — TOPIRAMATE 50 MG PO TABS: 50.0000 mg | ORAL_TABLET | Freq: Every evening | ORAL | 3 refills | Status: DC

## 2020-07-04 NOTE — Patient Instructions (Signed)
Your Plan:  Continue Depakote ER 500 mg nightly and topiramate 50 mg nightly  Continue to follow with your PCP regarding new right leg clot, ongoing use of Eliquis and surveillance monitoring    Follow-up in 1 year or call earlier if needed     Thank you for coming to see [place] at [facility]. I hope we have been able to provide you high quality care today.  You may receive a patient satisfaction survey over the next few weeks. We would appreciate your feedback and comments so that we may continue to improve ourselves and the health of our patients.

## 2020-07-04 NOTE — Progress Notes (Signed)
[facility] [address] [place]. [place] [address] [phone]       OFFICE FOLLOW-UP NOTE  Kenneth. Kenneth Bowen Date of Birth:  1930-05-22 Medical Record Number:  [id number]   HPI: Initial visit 02/10/2018 Kenneth Bowen is a 87 year Caucasian male seen today for first office follow-up visit for in-hospital with admission in March 2018 for episode of brief loss of awareness. History is obtained from the patient, review of electronic medical records and have personally reviewed imaging for films. Kenneth Elzey Basingeris an 87 y.o.malewho was  at home earlier and wife saw him standing up against the wall unresponsive, staring and had a bowel movement.    He was able to share what his wife described as another similar episodes  Two weeks ago, patient was leaning over to pick up something and says he may have strained to reach down to get the item.  He says his head was "all the way down."  When he sat up, he apparently became dizzy and had blurred vision.  He then became confused and his wife told him that for 20 minutes he "reached for things that were not there and acted confused."  This lasted for 20 minutes and he returned to normal by the time EMS arrived.  The second event occurred prior to admission.  Patient was sitting upright and did not have a positional change or strain to reach for something.  He had sudden onset of dizziness.  Like the time two weeks before, his wife assisted him to another room but he was confused again.  This time the patient did not "return to clear understanding" until he was in the ambulance.  This was approximately 30-40 minutes after onset.  When asked if this has happened before, patient recalled an event 10 years prior when he suddenly lost his words for a few minutes and an event 15 years prior when he had trouble understanding the words he was reading.  However, no confusion spells of note. CT scan of the brain on admission was unremarkable and CT  angiogram showed no large vessel stenosis or occlusion. EEG was obtained which showed no definite seizure activity. Transthoracic echo showed normal ejection fraction. LDL cholesterol was 55 mg percent. Hemoglobin A1c was 5.8. Patient was started on a trial of Depakote ER 500 mg daily. He states he tolerated well but he ran out of a week ago has not filled it yet. He complained of increased frequency of soft stools about 3 times a day he felt this may be related to Depakote. He has had no further recurrent episodes. Denies any prior history of significant head injury with loss of consciousness, childhood epilepsy or other seizures. Update 04/28/2018 : He returns for follow-up after last visit 2-1/2 months ago.  He had unexplained fall backwards when he was getting up quickly.  He saw his primary physician Dr. Nyoka Cowden who recommended he see me for further evaluation.  He states that he has had several such falls following his initial fall in 2017 when he fell and sustained compression fracture of 2 vertebrae in the lumbar spine.  Since then he is learned to get up slowly but most of his falls have been backwards.  He denies any passing out or severe pain leading to the fall.  Patient was seen last week in our office in September 2019 at that time he had comprehensive metabolic panel as well as CBC drawn which were unremarkable.  He remains on Depakote  ER 500 daily and has had no further episodes of seizure-like events.  He is been bothered by swelling in his feet and Dr. Nyoka Cowden feels this may be related to Depakote.  He has been started on Lasix 20 mg but so far has not shown significant improvement.  He denies any weight gain, tiredness, tremors or dizziness.  Patient denies any leg pain, tingling and numbness.  He does have a feeling of restlessness particularly at night and had trouble sleeping but since he has been taking Xanax 1 mg at night as well as trazodone 50 mg the symptoms are under control.  He has not  tried other medications for restless legs like Topamax, gabapentin, Requip or Lyrica.  He denies any tremors in his hands, drooling of saliva, ,bradykinesia, festination no stooped posture.  He still has chronic back pain and his walking is affected by that and he has to favor his back while getting up and sitting down.  He denies radicular pain or numbness. Update 02/16/2019 : He returns for follow-up after last visit 10 months ago.  He states is doing well.  Continues to have gait and balance difficulties with tendency to fall backwards.  He is at only one fall as he is quite careful with no major injuries.  Did undergo MRI scan of the brain on 05/12/2018 which I personally reviewed shows changes of generalized cerebral atrophy as well as changes of chronic microvascular disease.  No significant change compared with previous MRI from 07/18/2016.  The patient states he is tolerating Topamax 50 mg at bedtime quite well and seems to have helped his restless leg syndrome.  He still however needs to take Xanax as it helps him sleep.  He denies significant tremor or bradykinesia with drooling of saliva.  He feels overall his gait balance and difficulty is about unchanged  Update 09/03/2019: He returns for follow-up after last visit 6 months ago.  He continues to have frequent falls and has had compression fractures of his spine.  He saw Dr. Gildardo Griffes from [facility] who did some x-rays and referred him to Dr. Nelva Bush who did MRI scan lumbar spine on 07/16/2019 which shows acute L5 compression fracture as well as multilevel chronic degenerative changes and compression fractures at T11 and L3 with moderate to severe right L4-5 neural foraminal stenosis and moderate to severe left L3-L4 foraminal stenosis.  Patient has been referred to physical therapy and states he has benefited and is able to pull his shoulders back and look up better and walk with his feet more apart.  He states he has been using a wheeled walker and has  not had any falls in the last 3 weeks.  He does not have any resting or action tremor or rigidity.  He states his restless legs are well controlled on the current dose of Topamax at night.  He is also had no episodes of altered awareness or seizure-like activity since he started taking Depakote several years ago.  He has no new complaints.  Update 07/04/2020 JM: Kenneth Bowen is being seen for routine follow-up previously seen approximately 10 months ago by Dr. Leonie Man.  He has remained on Depakote tolerating without side effects and denies any episodes of altered awareness or seizure-like activity.  He remains on topiramate for adequate control of RLS tolerating without side effects.  Continues to have gait difficulty with use of RW and 1 fall in the beginning of February thankfully without injury.  He has been doing exercises  at his [facility] for patients with Parkinson's disease which has been improving overall gait.  He denies any resting or action tremor or drooling and mild bradykinesia stable without worsening.  Recently diagnosed with acute RLE DVT in femoral, popliteal and tibial arteries on 2/9.  Eliquis initiated by PCP which she has remained on without bleeding or bruising.  No further concerns at this time.    ROS:   14 system review of systems is positive for those listed in HPI only all other systems negative  PMH:  Past Medical History:  Diagnosis Date  . Colon polyps   . Diverticulosis   . Prostate cancer [facility]) 2015   radiation finished Nov 2015  . Seizures ([place]) 07/2016    Social History:  Social History   Socioeconomic History  . Marital status: Married    Spouse name: Not on file  . Number of children: Not on file  . Years of education: Not on file  . Highest education level: Not on file  Occupational History  . Occupation: civil Chief Financial Officer  Tobacco Use  . Smoking status: Never Smoker  . Smokeless tobacco: Never Used  Substance and Sexual Activity  . Alcohol use: Yes     Alcohol/week: 7.0 standard drinks    Types: 7 Glasses of wine per week    Comment: 4 oz nightly  . Drug use: No  . Sexual activity: Yes  Other Topics Concern  . Not on file  Social History Narrative   Lives with wife at [facility] in [place].  Uses cane.     Social Determinants of Health   Financial Resource Strain: Not on file  Food Insecurity: Not on file  Transportation Needs: Not on file  Physical Activity: Not on file  Stress: Not on file  Social Connections: Not on file  Intimate Partner Violence: Not on file    Medications:   Current Outpatient Medications on File Prior to Visit  Medication Sig Dispense Refill  . ALPRAZolam (XANAX) 0.5 MG tablet Take 0.5 mg by mouth daily as needed for anxiety.    . ARTIFICIAL TEAR OP Place 1 drop into both eyes daily as needed. For dry eyes    . aspirin 81 MG tablet Take 1 tablet (81 mg total) by mouth daily. Over the counter 30 tablet 3  . cholecalciferol (VITAMIN D) 1000 UNITS tablet Take 2,000 Units by mouth daily.     Marland Kitchen desoximetasone (TOPICORT) 0.25 % cream Apply 1 application topically 2 (two) times daily as needed (rash).     . divalproex (DEPAKOTE ER) 500 MG 24 hr tablet TAKE 1 TABLET BY MOUTH EVERY DAY 90 tablet 0  . ferrous sulfate 325 (65 FE) MG tablet Take 325 mg by mouth daily.    . furosemide (LASIX) 20 MG tablet Take 20 mg by mouth daily.     . hydrocortisone 2.5 % cream Apply 1 application topically daily as needed for other.    . mometasone (ELOCON) 0.1 % ointment Apply 1 application topically 2 (two) times daily as needed (rash).     . Multiple Vitamin (MULTIVITAMIN) tablet Take 1 tablet by mouth daily.    Marland Kitchen omeprazole (PRILOSEC) 20 MG capsule Take 20 mg by mouth daily as needed (acid reflux).     . tamsulosin (FLOMAX) 0.4 MG CAPS capsule TAKE 1 CAPSULE (0.4 MG TOTAL) BY MOUTH DAILY AFTER SUPPER. 30 capsule 5  . topiramate (TOPAMAX) 50 MG tablet Take 1 tablet (50 mg total) by mouth at bedtime.  90 tablet 1   . traZODone (DESYREL) 50 MG tablet Take 25 mg by mouth at bedtime.     . triamcinolone cream (KENALOG) 0.1 % Apply 1 application topically 2 (two) times daily as needed (rash).      No current facility-administered medications on file prior to visit.    Allergies:  No Known Allergies  Physical Exam Today's Vitals   07/04/20 1236  BP: 126/68  Pulse: (!) 58  Weight: 177 lb (80.3 kg)  Height: 5\' 10"  (1.778 m)   Body mass index is 25.4 kg/m.   General: Frail elderly Caucasian male, seated, in no evident distress Head: head normocephalic and atraumatic.  Neck: supple with no carotid or supraclavicular bruits Cardiovascular: regular rate and rhythm, no murmurs Musculoskeletal: Marked kyphosis Skin:  no rash/petichiae Vascular:  Normal pulses all extremities  Neurologic Exam Mental Status: Awake and fully alert.  Fluent speech and language.  Oriented to place and time. Recent and remote memory intact. Attention span, concentration and fund of knowledge appropriate. Mood and affect appropriate.  Diminished facial expression.  Positive glabellar tap. Cranial Nerves: Pupils equal, briskly reactive to light. Extraocular movements full without nystagmus. Visual fields full to confrontation. Hearing diminished bilaterally Facial sensation intact. Face, tongue, palate moves normally and symmetrically.  Motor: Normal bulk and tone. Normal strength in all tested extremity muscles.  No pill-rolling tremor but mild cogwheel rigidity at the left wrist.  Very mild bradykinesia but able to get up from a chair with arms folded Sensory.: intact to touch ,pinprick .position and vibratory sensation.  Coordination: Rapid alternating movements normal in all extremities. Finger-to-nose and heel-to-shin performed accurately bilaterally. Gait and Station: Arises from chair without difficulty. Stance is markedly stooped l. Gait demonstrates decreased step length and step height with adequate balance and use of  rolling walker.  Tandem walk and heel toe not attempted due to use of rolling walker. Reflexes: 1+ and symmetric. Toes downgoing.       ASSESSMENT/PLAN: 89 year Caucasian male with 2 episodes in a 3 week period of brief altered consciousness possible Complex partial seizures.  Episodes of falling backwards likely of multifactorial etiology given mild parkinsonian features and degenerative back disease.  He also has restless legs syndrome which is well controlled on Topamax.  Recent diagnosis of extensive RLE DVT (femoral, popliteal and tibial) placed on Eliquis which is managed by PCP   1.  Complex partial seizures 2.  RLS 3.  Parkinsonian features   1.  Continue Depakote ER 500 mg nightly for seizure prophylaxis -refill provided  2.  Topiramate 50 mg nightly -refill provided  3.  He questions further evaluation for possible Parkinson's disease vs parkinsonianism.  His symptoms have not progressed and even reports improvement of overall gait.  Discussed possible evaluation with DAT scan but would likely not be of any benefit at this present time as current treatment plan would not change.  Education material provided and he will continue to monitor symptoms and if progresses, may consider further evaluation or possible trial of medication as previously recommended by Dr. Leonie Man    Follow-up in 1 year or call earlier if needed   CC:  [place] provider: Dr. Consuella Lose, Liane Comber, DO   I spent 35 minutes of face-to-face and non-face-to-face time with patient.  This included previsit chart review, lab review, study review, order entry, electronic health record documentation, patient education and discussion regarding history of complex partial seizures and ongoing use of AED, RLS and ongoing use of  topiramate, Parkinson's vs parkinsonianism and answered all other questions to patient satisfaction  Frann Rider, [facility]  [facility] [address] [place],  [place] [address]  Phone [phone] Fax [phone] Note: This document was prepared with digital dictation and possible smart phrase technology. Any transcriptional errors that result from this process are unintentional.

## 2020-07-05 NOTE — Progress Notes (Signed)
I agree with the above plan 

## 2020-08-12 ENCOUNTER — Telehealth: Payer: Self-pay | Admitting: Hematology and Oncology

## 2020-08-12 NOTE — Telephone Encounter (Signed)
Received a new hem referral from Dr. Francesco Sor for acute dvt. Kenneth Bowen has been cld and scheduled to see Dr. Lorenso Courier on 4/6 at 2pm. Pt aware to arrive 20 minutes early.

## 2020-08-16 NOTE — Progress Notes (Signed)
[place] [phone]   Fax:([phone]) [place] NOTE  Patient Care Team: Sueanne Margarita, DO as PCP - General (Internal Medicine)  Hematological/Oncological History # Unprovoked Acute Lower Extremity DVT 06/21/2020: Lower extremity US shows findings consistent with acute deep vein thrombosis involving the right  posterior tibial veins, right peroneal veins, right popliteal vein, and  right femoral vein. Started on Eliqiuis 5mg  BID.  08/17/2020: establish care with Dr. Lorenso Courier   CHIEF COMPLAINTS/PURPOSE OF CONSULTATION:  "Acute Lower Right Extremity DVT"  HISTORY OF PRESENTING ILLNESS:  Kenneth Bowen 87 y.o. male with medical history significant for prostate cancer status post radiation in November 2015, diverticulosis, and seizures who presents for evaluation of a recently diagnosed right lower extremity DVT.  On review of the previous records Kenneth Bowen underwent an ultrasound of his right lower extremity on 06/21/2020.  At that time he had findings consistent with an acute deep vein thrombosis involving the right posterior tibial veins, right peroneal veins, right popliteal vein, and right femoral vein.  On exam today Kenneth Bowen notes that he has chronic issues with swelling in his lower extremities.  He was previously on Lasix 20 mg p.o. daily along with compression socks to try to help with this.  He reports he did try a higher dose at 1 point in time but had to decrease back down to 20 mg as he was losing weight.  Around the end of January the patient began having pain and tightness in his right lower extremity.  An ultrasound was performed on 06/21/2020 which showed acute deep vein thrombosis involving the right posterior tibial veins, right peroneal veins, right popliteal vein, and the right femoral vein.  He is started on Eliquis 5 mg twice daily at that time and reports he has been tolerating it well.  He has had no bleeding, bruising, or dark  stools.  He notes the medication cost about $500 for 33-month supply, but this is not "because inhibitive".  On further discussion he notes that he has no family history of clotting disorders.  He reports that his mother had COPD and his father died at a young age due to alcohol abuse.  He has 3 sons who are currently healthy.  He is a never smoker and only a moderate alcohol drinker of about 3 drinks per week previously now down about 4 ounces of wine per week.  He was previously a Civil engineer, contracting.  He reports that he has had no surgeries, prolonged mobility, travel, or other provoking factors for his DVT.  He denies any fevers, chills, sweats, nausea, vomiting or diarrhea.  A full 10 point ROS is listed below.  MEDICAL HISTORY:  Past Medical History:  Diagnosis Date  . Colon polyps   . Diverticulosis   . Prostate cancer [facility]) 2015   radiation finished Nov 2015  . Seizures ([place]) 07/2016    SURGICAL HISTORY: Past Surgical History:  Procedure Laterality Date  . colonscopy    . INGUINAL HERNIA REPAIR    . KNEE SURGERY    . PROSTATE BIOPSY      SOCIAL HISTORY: Social History   Socioeconomic History  . Marital status: Married    Spouse name: Not on file  . Number of children: Not on file  . Years of education: Not on file  . Highest education level: Not on file  Occupational History  . Occupation: civil Chief Financial Officer  Tobacco Use  . Smoking status: Never Smoker  . Smokeless  tobacco: Never Used  Substance and Sexual Activity  . Alcohol use: Yes    Alcohol/week: 7.0 standard drinks    Types: 7 Glasses of wine per week    Comment: 4 oz nightly  . Drug use: No  . Sexual activity: Yes  Other Topics Concern  . Not on file  Social History Narrative   Lives with wife at [facility] in [place].  Uses cane.     Social Determinants of Health   Financial Resource Strain: Not on file  Food Insecurity: Not on file  Transportation Needs: Not on file  Physical Activity: Not  on file  Stress: Not on file  Social Connections: Not on file  Intimate Partner Violence: Not on file    FAMILY HISTORY: Family History  Problem Relation Age of Onset  . Alcoholism Father   . Cancer Neg Hx   . Colon cancer Neg Hx     ALLERGIES:  has No Known Allergies.  MEDICATIONS:  Current Outpatient Medications  Medication Sig Dispense Refill  . apixaban (ELIQUIS) 5 MG TABS tablet Take 5 mg by mouth 2 (two) times daily.    Marland Kitchen ALPRAZolam (XANAX) 0.5 MG tablet Take 0.5 mg by mouth daily as needed for anxiety.    . ARTIFICIAL TEAR OP Place 1 drop into both eyes daily as needed. For dry eyes    . cholecalciferol (VITAMIN D) 1000 UNITS tablet Take 2,000 Units by mouth daily.     Marland Kitchen desoximetasone (TOPICORT) 0.25 % cream Apply 1 application topically 2 (two) times daily as needed (rash).     . divalproex (DEPAKOTE ER) 500 MG 24 hr tablet Take 1 tablet (500 mg total) by mouth daily. 90 tablet 3  . furosemide (LASIX) 20 MG tablet Take 20 mg by mouth daily.     . hydrocortisone 2.5 % cream Apply 1 application topically daily as needed for other.    . mometasone (ELOCON) 0.1 % ointment Apply 1 application topically 2 (two) times daily as needed (rash).     . Multiple Vitamin (MULTIVITAMIN) tablet Take 1 tablet by mouth daily.    Marland Kitchen omeprazole (PRILOSEC) 20 MG capsule Take 20 mg by mouth daily as needed (acid reflux).     . tamsulosin (FLOMAX) 0.4 MG CAPS capsule TAKE 1 CAPSULE (0.4 MG TOTAL) BY MOUTH DAILY AFTER SUPPER. 30 capsule 5  . topiramate (TOPAMAX) 50 MG tablet Take 1 tablet (50 mg total) by mouth at bedtime. 90 tablet 3  . traZODone (DESYREL) 50 MG tablet Take 25 mg by mouth at bedtime.     . triamcinolone cream (KENALOG) 0.1 % Apply 1 application topically 2 (two) times daily as needed (rash).      No current facility-administered medications for this visit.    REVIEW OF SYSTEMS:   Constitutional: ( - ) fevers, ( - )  chills , ( - ) night sweats Eyes: ( - ) blurriness of  vision, ( - ) double vision, ( - ) watery eyes Ears, nose, mouth, throat, and face: ( - ) mucositis, ( - ) sore throat Respiratory: ( - ) cough, ( - ) dyspnea, ( - ) wheezes Cardiovascular: ( - ) palpitation, ( - ) chest discomfort, ( - ) lower extremity swelling Gastrointestinal:  ( - ) nausea, ( - ) heartburn, ( - ) change in bowel habits Skin: ( - ) abnormal skin rashes Lymphatics: ( - ) new lymphadenopathy, ( - ) easy bruising Neurological: ( - ) numbness, ( - )  tingling, ( - ) new weaknesses Behavioral/Psych: ( - ) mood change, ( - ) new changes  All other systems were reviewed with the patient and are negative.  PHYSICAL EXAMINATION: Vitals:   08/17/20 1355  BP: 124/68  Pulse: 65  Resp: 15  Temp: (!) 97.5 F (36.4 C)  SpO2: 98%   Filed Weights   08/17/20 1355  Weight: 178 lb 11.2 oz (81.1 kg)    GENERAL: well appearing elderly Caucasian male in NAD  SKIN: skin color, texture, turgor are normal, no rashes or significant lesions EYES: conjunctiva are pink and non-injected, sclera clear LUNGS: clear to auscultation and percussion with normal breathing effort HEART: regular rate & rhythm and no murmurs. +2 pitting edema in RLE, +1 in LLE. Musculoskeletal: no cyanosis of digits and no clubbing  PSYCH: alert & oriented x 3, fluent speech NEURO: no focal motor/sensory deficits  LABORATORY DATA:  I have reviewed the data as listed CBC Latest Ref Rng & Units 07/21/2019 02/10/2018 06/10/2017  WBC 4.0 - 10.5 K/uL 7.3 8.6 6.0  Hemoglobin 13.0 - 17.0 g/dL 11.3(L) 12.2(L) 12.7(L)  Hematocrit 39.0 - 52.0 % 34.4(L) 37.1(L) 37.8  Platelets 150 - 400 K/uL 287 267 244    CMP Latest Ref Rng & Units 07/21/2019 02/10/2018 06/10/2017  Glucose 70 - 99 mg/dL 101(H) 110(H) 82  BUN 8 - 23 mg/dL 19 15 14   Creatinine 0.61 - 1.24 mg/dL 0.68 0.72(L) 0.87  Sodium 135 - 145 mmol/L 137 138 139  Potassium 3.5 - 5.1 mmol/L 3.9 4.6 4.8  Chloride 98 - 111 mmol/L 107 98 100  CO2 22 - 32 mmol/L 25 23 25    Calcium 8.9 - 10.3 mg/dL 8.9 9.1 9.0  Total Protein 6.0 - 8.5 g/dL - 6.4 6.6  Total Bilirubin 0.0 - 1.2 mg/dL - 0.2 0.3  Alkaline Phos 39 - 117 IU/L - 67 54  AST 0 - 40 IU/L - 16 19  ALT 0 - 44 IU/L - 13 14    RADIOGRAPHIC STUDIES: No results found.  ASSESSMENT & PLAN SABA NEUMAN 87 y.o. male with medical history significant for prostate cancer status post radiation in November 2015, diverticulosis, and seizures who presents for evaluation of a recently diagnosed right lower extremity DVT.  After review the labs, review the records, discussed with the patient the findings most consistent with an unprovoked right lower extremity DVT.  He is currently taking Eliquis therapy 5 mg p.o. twice daily and tolerating it well.  Unfortunately given the unprovoked nature of this clot the recommendation would be for lifelong anticoagulation.  Today we discussed the risks and benefits of anticoagulation therapy and the plan moving forward.  At the very least this patient will require 3 months of anticoagulation therapy to treat the blood clot.  If he has residual symptoms after 3 months we could reevaluate and consider extension to 6 months time.  Given the unprovoked nature of this clot we do not know what caused it and therefore the recommendation would be for indefinite anticoagulation.  He is at advanced age and unstable on his feet, however falls should not deter one from indefinite anticoagulation.  Given these findings I would f/u in 1 months time to re-evaluate the course of action moving forward.  He has no history of prior bleeds and is tolerating therapy well so far, therefore my preference would be for indefinite anticoagulation, but if the patient feels the medication is too expensive or if the bleeding risk is too high  then d/c therapy would be a reasonable option.  The patient voices understanding of this plan moving forward.  # Unprovoked Acute Right Lower Extremity DVT --continue  eliquis 5mg  BID PO for a minimum of 3 months. Can reassess after that time --unfortunately given the unprovoked nature of this VTE the recommendation is for lifelong anticoagulation. Given his advanced age and unsteadiness on his feet I think it would be reasonable to have a discussion after the 3 month mark to determine the best course of action.  --no indication for a hypercoagulation workup as it would not change management --RTC in 1 months time to reassess (3 months from diagnosis of the clot).   No orders of the defined types were placed in this encounter.   All questions were answered. The patient knows to call the clinic with any problems, questions or concerns.  A total of more than 60 minutes were spent on this encounter and over half of that time was spent on counseling and coordination of care as outlined above.   Ledell Peoples, MD Department of Hematology/Oncology [place] at [facility] Phone: [phone] Pager: [phone] Email: [email or web address]  08/17/2020 3:15 PM

## 2020-08-17 ENCOUNTER — Other Ambulatory Visit: Payer: Self-pay

## 2020-08-17 ENCOUNTER — Inpatient Hospital Stay: Payer: Medicare Other

## 2020-08-17 ENCOUNTER — Inpatient Hospital Stay: Payer: Medicare Other | Attending: Hematology and Oncology | Admitting: Hematology and Oncology

## 2020-08-17 VITALS — BP 124/68 | HR 65 | Temp 97.5°F | Resp 15 | Ht 70.0 in | Wt 178.7 lb

## 2020-08-17 DIAGNOSIS — Z7901 Long term (current) use of anticoagulants: Secondary | ICD-10-CM | POA: Diagnosis not present

## 2020-08-17 DIAGNOSIS — I82401 Acute embolism and thrombosis of unspecified deep veins of right lower extremity: Secondary | ICD-10-CM | POA: Diagnosis not present

## 2020-08-17 DIAGNOSIS — I82411 Acute embolism and thrombosis of right femoral vein: Secondary | ICD-10-CM

## 2020-08-17 DIAGNOSIS — Z8546 Personal history of malignant neoplasm of prostate: Secondary | ICD-10-CM | POA: Insufficient documentation

## 2020-08-19 ENCOUNTER — Telehealth: Payer: Self-pay | Admitting: Hematology and Oncology

## 2020-08-19 NOTE — Telephone Encounter (Signed)
Scheduled per los. Called and left msg. Mailed printout  °

## 2020-09-15 ENCOUNTER — Other Ambulatory Visit: Payer: Self-pay | Admitting: Hematology and Oncology

## 2020-09-15 ENCOUNTER — Inpatient Hospital Stay: Payer: Medicare Other

## 2020-09-15 ENCOUNTER — Other Ambulatory Visit: Payer: Self-pay

## 2020-09-15 ENCOUNTER — Encounter: Payer: Self-pay | Admitting: Hematology and Oncology

## 2020-09-15 ENCOUNTER — Inpatient Hospital Stay: Payer: Medicare Other | Attending: Hematology and Oncology | Admitting: Hematology and Oncology

## 2020-09-15 VITALS — BP 130/74 | HR 72 | Temp 97.4°F | Resp 20 | Ht 70.0 in | Wt 176.7 lb

## 2020-09-15 DIAGNOSIS — I82441 Acute embolism and thrombosis of right tibial vein: Secondary | ICD-10-CM | POA: Diagnosis not present

## 2020-09-15 DIAGNOSIS — I82431 Acute embolism and thrombosis of right popliteal vein: Secondary | ICD-10-CM | POA: Diagnosis not present

## 2020-09-15 DIAGNOSIS — Z7901 Long term (current) use of anticoagulants: Secondary | ICD-10-CM | POA: Insufficient documentation

## 2020-09-15 DIAGNOSIS — I82411 Acute embolism and thrombosis of right femoral vein: Secondary | ICD-10-CM

## 2020-09-15 DIAGNOSIS — I82451 Acute embolism and thrombosis of right peroneal vein: Secondary | ICD-10-CM | POA: Insufficient documentation

## 2020-09-15 LAB — CBC WITH DIFFERENTIAL (CANCER CENTER ONLY)
Abs Immature Granulocytes: 0.06 10*3/uL (ref 0.00–0.07)
Basophils Absolute: 0 10*3/uL (ref 0.0–0.1)
Basophils Relative: 1 %
Eosinophils Absolute: 0.2 10*3/uL (ref 0.0–0.5)
Eosinophils Relative: 4 %
HCT: 36.4 % — ABNORMAL LOW (ref 39.0–52.0)
Hemoglobin: 12.3 g/dL — ABNORMAL LOW (ref 13.0–17.0)
Immature Granulocytes: 1 %
Lymphocytes Relative: 21 %
Lymphs Abs: 1.2 10*3/uL (ref 0.7–4.0)
MCH: 32.8 pg (ref 26.0–34.0)
MCHC: 33.8 g/dL (ref 30.0–36.0)
MCV: 97.1 fL (ref 80.0–100.0)
Monocytes Absolute: 0.8 10*3/uL (ref 0.1–1.0)
Monocytes Relative: 14 %
Neutro Abs: 3.5 10*3/uL (ref 1.7–7.7)
Neutrophils Relative %: 59 %
Platelet Count: 254 10*3/uL (ref 150–400)
RBC: 3.75 MIL/uL — ABNORMAL LOW (ref 4.22–5.81)
RDW: 12.2 % (ref 11.5–15.5)
WBC Count: 5.8 10*3/uL (ref 4.0–10.5)
nRBC: 0 % (ref 0.0–0.2)

## 2020-09-15 LAB — CMP (CANCER CENTER ONLY)
ALT: 9 U/L (ref 0–44)
AST: 17 U/L (ref 15–41)
Albumin: 3.8 g/dL (ref 3.5–5.0)
Alkaline Phosphatase: 63 U/L (ref 38–126)
Anion gap: 7 (ref 5–15)
BUN: 16 mg/dL (ref 8–23)
CO2: 26 mmol/L (ref 22–32)
Calcium: 9 mg/dL (ref 8.9–10.3)
Chloride: 106 mmol/L (ref 98–111)
Creatinine: 0.88 mg/dL (ref 0.61–1.24)
GFR, Estimated: >60 mL/min (ref >60)
Glucose, Bld: 108 mg/dL — ABNORMAL HIGH (ref 70–99)
Potassium: 4.5 mmol/L (ref 3.5–5.1)
Sodium: 139 mmol/L (ref 135–145)
Total Bilirubin: <0.2 mg/dL — ABNORMAL LOW (ref 0.3–1.2)
Total Protein: 7.1 g/dL (ref 6.5–8.1)

## 2020-09-15 NOTE — Progress Notes (Signed)
[place] [phone]   [phone]  PROGRESS NOTE  Patient Care Team: Sueanne Margarita, DO as PCP - General (Internal Medicine)  Hematological/Oncological History # Unprovoked Acute Lower Extremity DVT 06/21/2020: Lower extremity US shows findings consistent with acute deep vein thrombosis involving the right  posterior tibial veins, right peroneal veins, right popliteal vein, and  right femoral vein. Started on Eliqiuis 5mg  BID.  08/17/2020: establish care with Dr. Lorenso Courier   Interval History:  Kenneth Bowen 87 y.o. male with medical history significant for an unprovoked RLE DVT who presents for a follow up visit. The patient's last visit was on 08/17/2020 at which time he established care. In the interim since the last visit he has continued to faithfully take his Eliquis therapy.  On exam today Kenneth Bowen reports that he has been well in the interim since her last visit.  He has been taking Eliquis 5 mg twice daily as prescribed without any difficulties.  He notes he has not been having any bleeding, bruising, or dark stools.  He unfortunately is struggling right now with a rash on his arms and chest for which she has been prescribed different creams in order to try to assist with this.  He does continue to have swelling in his right leg but no frank pain.  He notes that he is wearing stockings and his leg does feel "tight" when he bends it more than 90 degrees.  He reports that he is happy to be doing exercises in order to help improve his strength with his Parkinson's disease.  He currently denies any fevers, chills, sweats, nausea, vomiting or diarrhea.  A full 10 point ROS is listed below  MEDICAL HISTORY:  Past Medical History:  Diagnosis Date  . Colon polyps   . Diverticulosis   . Prostate cancer [facility]) 2015   radiation finished Nov 2015  . Seizures ([place]) 07/2016    SURGICAL HISTORY: Past Surgical History:  Procedure Laterality Date  .  colonscopy    . INGUINAL HERNIA REPAIR    . KNEE SURGERY    . PROSTATE BIOPSY      SOCIAL HISTORY: Social History   Socioeconomic History  . Marital status: Married    Spouse name: Not on file  . Number of children: Not on file  . Years of education: Not on file  . Highest education level: Not on file  Occupational History  . Occupation: civil Chief Financial Officer  Tobacco Use  . Smoking status: Never Smoker  . Smokeless tobacco: Never Used  Substance and Sexual Activity  . Alcohol use: Yes    Alcohol/week: 7.0 standard drinks    Types: 7 Glasses of wine per week    Comment: 4 oz nightly  . Drug use: No  . Sexual activity: Yes  Other Topics Concern  . Not on file  Social History Narrative   Lives with wife at [facility] in [place].  Uses cane.     Social Determinants of Health   Financial Resource Strain: Not on file  Food Insecurity: Not on file  Transportation Needs: Not on file  Physical Activity: Not on file  Stress: Not on file  Social Connections: Not on file  Intimate Partner Violence: Not on file    FAMILY HISTORY: Family History  Problem Relation Age of Onset  . Alcoholism Father   . Cancer Neg Hx   . Colon cancer Neg Hx     ALLERGIES:  has No Known Allergies.  MEDICATIONS:  Current Outpatient Medications  Medication Sig Dispense Refill  . ALPRAZolam (XANAX) 0.5 MG tablet Take 0.5 mg by mouth daily as needed for anxiety.    Marland Kitchen apixaban (ELIQUIS) 5 MG TABS tablet Take 5 mg by mouth 2 (two) times daily.    . ARTIFICIAL TEAR OP Place 1 drop into both eyes daily as needed. For dry eyes    . cholecalciferol (VITAMIN D) 1000 UNITS tablet Take 2,000 Units by mouth daily.     Marland Kitchen desoximetasone (TOPICORT) 0.25 % cream Apply 1 application topically 2 (two) times daily as needed (rash).     . divalproex (DEPAKOTE ER) 500 MG 24 hr tablet Take 1 tablet (500 mg total) by mouth daily. 90 tablet 3  . furosemide (LASIX) 20 MG tablet Take 20 mg by mouth daily.      . hydrocortisone 2.5 % cream Apply 1 application topically daily as needed for other.    . mometasone (ELOCON) 0.1 % ointment Apply 1 application topically 2 (two) times daily as needed (rash).     . Multiple Vitamin (MULTIVITAMIN) tablet Take 1 tablet by mouth daily.    Marland Kitchen omeprazole (PRILOSEC) 20 MG capsule Take 20 mg by mouth daily as needed (acid reflux).     . tamsulosin (FLOMAX) 0.4 MG CAPS capsule TAKE 1 CAPSULE (0.4 MG TOTAL) BY MOUTH DAILY AFTER SUPPER. 30 capsule 5  . topiramate (TOPAMAX) 50 MG tablet Take 1 tablet (50 mg total) by mouth at bedtime. 90 tablet 3  . traZODone (DESYREL) 50 MG tablet Take 25 mg by mouth at bedtime.     . triamcinolone cream (KENALOG) 0.1 % Apply 1 application topically 2 (two) times daily as needed (rash).      No current facility-administered medications for this visit.    REVIEW OF SYSTEMS:   Constitutional: ( - ) fevers, ( - )  chills , ( - ) night sweats Eyes: ( - ) blurriness of vision, ( - ) double vision, ( - ) watery eyes Ears, nose, mouth, throat, and face: ( - ) mucositis, ( - ) sore throat Respiratory: ( - ) cough, ( - ) dyspnea, ( - ) wheezes Cardiovascular: ( - ) palpitation, ( - ) chest discomfort, ( - ) lower extremity swelling Gastrointestinal:  ( - ) nausea, ( - ) heartburn, ( - ) change in bowel habits Skin: ( - ) abnormal skin rashes Lymphatics: ( - ) new lymphadenopathy, ( - ) easy bruising Neurological: ( - ) numbness, ( - ) tingling, ( - ) new weaknesses Behavioral/Psych: ( - ) mood change, ( - ) new changes  All other systems were reviewed with the patient and are negative.  PHYSICAL EXAMINATION:  Vitals:   09/15/20 1529  BP: 130/74  Pulse: 72  Resp: 20  Temp: (!) 97.4 F (36.3 C)  SpO2: 95%   Filed Weights   09/15/20 1529  Weight: 176 lb 11.2 oz (80.2 kg)    GENERAL: alert, no distress and comfortable SKIN: skin color, texture, turgor are normal, no rashes or significant lesions EYES: conjunctiva are pink and  non-injected, sclera clear LUNGS: clear to auscultation and percussion with normal breathing effort HEART: regular rate & rhythm and no murmurs and no lower extremity edema Musculoskeletal: no cyanosis of digits and no clubbing  PSYCH: alert & oriented x 3, fluent speech NEURO: no focal motor/sensory deficits  LABORATORY DATA:  I have reviewed the data as listed CBC Latest Ref Rng & Units 09/15/2020 07/21/2019  02/10/2018  WBC 4.0 - 10.5 K/uL 5.8 7.3 8.6  Hemoglobin 13.0 - 17.0 g/dL 12.3(L) 11.3(L) 12.2(L)  Hematocrit 39.0 - 52.0 % 36.4(L) 34.4(L) 37.1(L)  Platelets 150 - 400 K/uL 254 287 267    CMP Latest Ref Rng & Units 09/15/2020 07/21/2019 02/10/2018  Glucose 70 - 99 mg/dL 108(H) 101(H) 110(H)  BUN 8 - 23 mg/dL 16 19 15   Creatinine 0.61 - 1.24 mg/dL 0.88 0.68 0.72(L)  Sodium 135 - 145 mmol/L 139 137 138  Potassium 3.5 - 5.1 mmol/L 4.5 3.9 4.6  Chloride 98 - 111 mmol/L 106 107 98  CO2 22 - 32 mmol/L 26 25 23   Calcium 8.9 - 10.3 mg/dL 9.0 8.9 9.1  Total Protein 6.5 - 8.1 g/dL 7.1 - 6.4  Total Bilirubin 0.3 - 1.2 mg/dL <0.2(L) - 0.2  Alkaline Phos 38 - 126 U/L 63 - 67  AST 15 - 41 U/L 17 - 16  ALT 0 - 44 U/L 9 - 13   RADIOGRAPHIC STUDIES: No results found.  ASSESSMENT & PLAN Kenneth Bowen 87 y.o. male with medical history significant for an unprovoked RLE DVT who presents for a follow up visit.  After review the labs, review the records, discussed with the patient the findings most consistent with an unprovoked right lower extremity DVT.  He is currently taking Eliquis therapy 5 mg p.o. twice daily and tolerating it well.  Unfortunately given the unprovoked nature of this clot the recommendation would be for lifelong anticoagulation.  Previously we discussed the risks and benefits of anticoagulation therapy and the plan moving forward.  Given the unprovoked nature of this clot we do not know what caused it and therefore the recommendation would be for indefinite anticoagulation.  He  is at advanced age and unstable on his feet, however falls should not deter one from indefinite anticoagulation.  Given these findings I would f/u in 3 months time to re-evaluate the course of action moving forward.  He has no history of prior bleeds and is tolerating therapy well so far, therefore my preference would be for indefinite anticoagulation, but if the patient feels the medication is too expensive or if the bleeding risk is too high then d/c therapy would be a reasonable option.  The patient voices understanding of this plan moving forward.  # Unprovoked Acute Right Lower Extremity DVT --continue eliquis 5mg  BID PO for a minimum of 6 months. Can reassess after that time --unfortunately given the unprovoked nature of this VTE the recommendation is for lifelong anticoagulation. Given his advanced age and unsteadiness on his feet I think it would be reasonable to have a discussion after the 6 month mark to determine the best course of action.  --no indication for a hypercoagulation workup as it would not change management --RTC in 3 months time to reassess (6 months from diagnosis of the clot).   No orders of the defined types were placed in this encounter.   All questions were answered. The patient knows to call the clinic with any problems, questions or concerns.  A total of more than 30 minutes were spent on this encounter and over half of that time was spent on counseling and coordination of care as outlined above.   Ledell Peoples, MD Department of Hematology/Oncology [place] at [facility] Phone: [phone] Pager: [phone] Email: [email or web address]  09/15/2020 4:11 PM

## 2020-09-16 ENCOUNTER — Telehealth: Payer: Self-pay | Admitting: Hematology and Oncology

## 2020-09-16 NOTE — Telephone Encounter (Signed)
Scheduled per los. Called and spoke with patient. Confirmed appt 

## 2020-12-21 ENCOUNTER — Inpatient Hospital Stay: Payer: Medicare Other | Admitting: Hematology and Oncology

## 2020-12-21 ENCOUNTER — Inpatient Hospital Stay: Payer: Medicare Other

## 2020-12-29 ENCOUNTER — Other Ambulatory Visit: Payer: Self-pay | Admitting: Internal Medicine

## 2020-12-29 DIAGNOSIS — R053 Chronic cough: Secondary | ICD-10-CM

## 2020-12-29 DIAGNOSIS — Z8546 Personal history of malignant neoplasm of prostate: Secondary | ICD-10-CM

## 2021-01-02 ENCOUNTER — Ambulatory Visit
Admission: RE | Admit: 2021-01-02 | Discharge: 2021-01-02 | Disposition: A | Discharge status: Home / Self Care | Payer: Medicare Other | Source: Ambulatory Visit | Attending: Internal Medicine | Admitting: Internal Medicine

## 2021-01-02 DIAGNOSIS — Z8546 Personal history of malignant neoplasm of prostate: Secondary | ICD-10-CM

## 2021-01-02 DIAGNOSIS — R053 Chronic cough: Secondary | ICD-10-CM

## 2021-01-02 MED ORDER — IOPAMIDOL (ISOVUE-370) INJECTION 76%
75.0000 mL | Freq: Once | INTRAVENOUS | Status: AC | PRN
  Administered 2021-01-02: 75 mL via INTRAVENOUS

## 2021-01-11 ENCOUNTER — Other Ambulatory Visit: Payer: Self-pay

## 2021-01-11 ENCOUNTER — Encounter: Payer: Self-pay | Admitting: Hematology and Oncology

## 2021-01-11 ENCOUNTER — Inpatient Hospital Stay: Payer: Medicare Other | Attending: Hematology and Oncology

## 2021-01-11 ENCOUNTER — Inpatient Hospital Stay (HOSPITAL_BASED_OUTPATIENT_CLINIC_OR_DEPARTMENT_OTHER): Payer: Medicare Other | Admitting: Hematology and Oncology

## 2021-01-11 ENCOUNTER — Other Ambulatory Visit: Payer: Self-pay | Admitting: Hematology and Oncology

## 2021-01-11 VITALS — BP 132/69 | HR 64 | Temp 96.4°F | Resp 17 | Wt 178.9 lb

## 2021-01-11 DIAGNOSIS — I82411 Acute embolism and thrombosis of right femoral vein: Secondary | ICD-10-CM

## 2021-01-11 DIAGNOSIS — R2681 Unsteadiness on feet: Secondary | ICD-10-CM | POA: Insufficient documentation

## 2021-01-11 DIAGNOSIS — D500 Iron deficiency anemia secondary to blood loss (chronic): Secondary | ICD-10-CM

## 2021-01-11 DIAGNOSIS — Z7901 Long term (current) use of anticoagulants: Secondary | ICD-10-CM | POA: Insufficient documentation

## 2021-01-11 DIAGNOSIS — I82401 Acute embolism and thrombosis of unspecified deep veins of right lower extremity: Secondary | ICD-10-CM | POA: Insufficient documentation

## 2021-01-11 LAB — CBC WITH DIFFERENTIAL (CANCER CENTER ONLY)
Abs Immature Granulocytes: 0.05 10*3/uL (ref 0.00–0.07)
Basophils Absolute: 0.1 10*3/uL (ref 0.0–0.1)
Basophils Relative: 1 %
Eosinophils Absolute: 0.2 10*3/uL (ref 0.0–0.5)
Eosinophils Relative: 4 %
HCT: 35.4 % — ABNORMAL LOW (ref 39.0–52.0)
Hemoglobin: 11.9 g/dL — ABNORMAL LOW (ref 13.0–17.0)
Immature Granulocytes: 1 %
Lymphocytes Relative: 28 %
Lymphs Abs: 1.5 10*3/uL (ref 0.7–4.0)
MCH: 33.1 pg (ref 26.0–34.0)
MCHC: 33.6 g/dL (ref 30.0–36.0)
MCV: 98.3 fL (ref 80.0–100.0)
Monocytes Absolute: 0.7 10*3/uL (ref 0.1–1.0)
Monocytes Relative: 13 %
Neutro Abs: 2.7 10*3/uL (ref 1.7–7.7)
Neutrophils Relative %: 53 %
Platelet Count: 255 10*3/uL (ref 150–400)
RBC: 3.6 MIL/uL — ABNORMAL LOW (ref 4.22–5.81)
RDW: 11.9 % (ref 11.5–15.5)
WBC Count: 5.2 10*3/uL (ref 4.0–10.5)
nRBC: 0 % (ref 0.0–0.2)

## 2021-01-11 LAB — CMP (CANCER CENTER ONLY)
ALT: 13 U/L (ref 0–44)
AST: 15 U/L (ref 15–41)
Albumin: 3.7 g/dL (ref 3.5–5.0)
Alkaline Phosphatase: 56 U/L (ref 38–126)
Anion gap: 8 (ref 5–15)
BUN: 16 mg/dL (ref 8–23)
CO2: 26 mmol/L (ref 22–32)
Calcium: 9.1 mg/dL (ref 8.9–10.3)
Chloride: 105 mmol/L (ref 98–111)
Creatinine: 0.94 mg/dL (ref 0.61–1.24)
GFR, Estimated: >60 mL/min (ref >60)
Glucose, Bld: 115 mg/dL — ABNORMAL HIGH (ref 70–99)
Potassium: 4.4 mmol/L (ref 3.5–5.1)
Sodium: 139 mmol/L (ref 135–145)
Total Bilirubin: 0.4 mg/dL (ref 0.3–1.2)
Total Protein: 6.8 g/dL (ref 6.5–8.1)

## 2021-01-11 MED ORDER — APIXABAN 2.5 MG PO TABS: 2.5000 mg | ORAL_TABLET | Freq: Two times a day (BID) | ORAL | 2 refills | Status: DC

## 2021-01-11 NOTE — Progress Notes (Signed)
[place] [phone]) [phone]   [phone]  PROGRESS NOTE  Patient Care Team: Sueanne Margarita, DO as PCP - General (Internal Medicine)  Hematological/Oncological History # Unprovoked Acute Lower Extremity DVT 06/21/2020: Lower extremity US shows findings consistent with acute deep vein thrombosis involving the right  posterior tibial veins, right peroneal veins, right popliteal vein, and  right femoral vein. Started on Eliqiuis '5mg'$  BID.  08/17/2020: establish care with Dr. Lorenso Courier    Interval History:  Kenneth Bowen 87 y.o. male with medical history significant for an unprovoked RLE DVT who presents for a follow up visit. The patient's last visit was on 09/15/2020 at which time he established care. In the interim since the last visit he has continued to faithfully take his Eliquis therapy.  On exam today Kenneth Bowen reports he has been well in the interim since her last visit.  He has been taking his Eliquis pills without difficulty and notes that he has not missed any dosages.  Bleeding, bruising, or dark stools.  He does occasionally nicked his skin and has some modest lacerations on his left arm.  He notes he does not have any trouble financially with medication.  He notes that his energy is so-so and that he "could be stronger".  He currently denies any fevers, chills, sweats, nausea, vomiting or diarrhea.  A full 10 point ROS is listed below  MEDICAL HISTORY:  Past Medical History:  Diagnosis Date   Colon polyps    Diverticulosis    Prostate cancer ([place]) 2015   radiation finished Nov 2015   Seizures ([place]) 07/2016    SURGICAL HISTORY: Past Surgical History:  Procedure Laterality Date   colonscopy     INGUINAL HERNIA REPAIR     KNEE SURGERY     PROSTATE BIOPSY      SOCIAL HISTORY: Social History   Socioeconomic History   Marital status: Married    Spouse name: Not on file   Number of children: Not on file   Years of education: Not on file    Highest education level: Not on file  Occupational History   Occupation: civil Chief Financial Officer  Tobacco Use   Smoking status: Never   Smokeless tobacco: Never  Substance and Sexual Activity   Alcohol use: Yes    Alcohol/week: 7.0 standard drinks    Types: 7 Glasses of wine per week    Comment: 4 oz nightly   Drug use: No   Sexual activity: Yes  Other Topics Concern   Not on file  Social History Narrative   Lives with wife at [facility] in [place].  Uses cane.     Social Determinants of Health   Financial Resource Strain: Not on file  Food Insecurity: Not on file  Transportation Needs: Not on file  Physical Activity: Not on file  Stress: Not on file  Social Connections: Not on file  Intimate Partner Violence: Not on file    FAMILY HISTORY: Family History  Problem Relation Age of Onset   Alcoholism Father    Cancer Neg Hx    Colon cancer Neg Hx     ALLERGIES:  has No Known Allergies.  MEDICATIONS:  Current Outpatient Medications  Medication Sig Dispense Refill   ALPRAZolam (XANAX) 0.5 MG tablet Take 0.5 mg by mouth daily as needed for anxiety.     apixaban (ELIQUIS) 5 MG TABS tablet Take 5 mg by mouth 2 (two) times daily.     ARTIFICIAL TEAR OP Place  1 drop into both eyes daily as needed. For dry eyes     cholecalciferol (VITAMIN D) 1000 UNITS tablet Take 2,000 Units by mouth daily.      desoximetasone (TOPICORT) 0.25 % cream Apply 1 application topically 2 (two) times daily as needed (rash).      divalproex (DEPAKOTE ER) 500 MG 24 hr tablet Take 1 tablet (500 mg total) by mouth daily. 90 tablet 3   furosemide (LASIX) 20 MG tablet Take 20 mg by mouth daily.      hydrocortisone 2.5 % cream Apply 1 application topically daily as needed for other.     mometasone (ELOCON) 0.1 % ointment Apply 1 application topically 2 (two) times daily as needed (rash).      Multiple Vitamin (MULTIVITAMIN) tablet Take 1 tablet by mouth daily.     omeprazole (PRILOSEC) 20 MG  capsule Take 20 mg by mouth daily as needed (acid reflux).      tamsulosin (FLOMAX) 0.4 MG CAPS capsule TAKE 1 CAPSULE (0.4 MG TOTAL) BY MOUTH DAILY AFTER SUPPER. 30 capsule 5   topiramate (TOPAMAX) 50 MG tablet Take 1 tablet (50 mg total) by mouth at bedtime. 90 tablet 3   traZODone (DESYREL) 50 MG tablet Take 25 mg by mouth at bedtime.      triamcinolone cream (KENALOG) 0.1 % Apply 1 application topically 2 (two) times daily as needed (rash).      No current facility-administered medications for this visit.    REVIEW OF SYSTEMS:   Constitutional: ( - ) fevers, ( - )  chills , ( - ) night sweats Eyes: ( - ) blurriness of vision, ( - ) double vision, ( - ) watery eyes Ears, nose, mouth, throat, and face: ( - ) mucositis, ( - ) sore throat Respiratory: ( - ) cough, ( - ) dyspnea, ( - ) wheezes Cardiovascular: ( - ) palpitation, ( - ) chest discomfort, ( - ) lower extremity swelling Gastrointestinal:  ( - ) nausea, ( - ) heartburn, ( - ) change in bowel habits Skin: ( - ) abnormal skin rashes Lymphatics: ( - ) new lymphadenopathy, ( - ) easy bruising Neurological: ( - ) numbness, ( - ) tingling, ( - ) new weaknesses Behavioral/Psych: ( - ) mood change, ( - ) new changes  All other systems were reviewed with the patient and are negative.  PHYSICAL EXAMINATION:  There were no vitals filed for this visit.  There were no vitals filed for this visit.   GENERAL: Well-appearing elderly Caucasian male, alert, no distress and comfortable SKIN: skin color, texture, turgor are normal, no rashes or significant lesions EYES: conjunctiva are pink and non-injected, sclera clear LUNGS: clear to auscultation and percussion with normal breathing effort HEART: regular rate & rhythm and no murmurs and no lower extremity edema Musculoskeletal: no cyanosis of digits and no clubbing  PSYCH: alert & oriented x 3, fluent speech NEURO: no focal motor/sensory deficits  LABORATORY DATA:  I have reviewed the  data as listed CBC Latest Ref Rng & Units 09/15/2020 07/21/2019 02/10/2018  WBC 4.0 - 10.5 K/uL 5.8 7.3 8.6  Hemoglobin 13.0 - 17.0 g/dL 12.3(L) 11.3(L) 12.2(L)  Hematocrit 39.0 - 52.0 % 36.4(L) 34.4(L) 37.1(L)  Platelets 150 - 400 K/uL 254 287 267    CMP Latest Ref Rng & Units 09/15/2020 07/21/2019 02/10/2018  Glucose 70 - 99 mg/dL 108(H) 101(H) 110(H)  BUN 8 - 23 mg/dL '16 19 15  '$ Creatinine 0.61 - 1.24 mg/dL 0.88  0.68 0.72(L)  Sodium 135 - 145 mmol/L 139 137 138  Potassium 3.5 - 5.1 mmol/L 4.5 3.9 4.6  Chloride 98 - 111 mmol/L 106 107 98  CO2 22 - 32 mmol/L '26 25 23  '$ Calcium 8.9 - 10.3 mg/dL 9.0 8.9 9.1  Total Protein 6.5 - 8.1 g/dL 7.1 - 6.4  Total Bilirubin 0.3 - 1.2 mg/dL <0.2(L) - 0.2  Alkaline Phos 38 - 126 U/L 63 - 67  AST 15 - 41 U/L 17 - 16  ALT 0 - 44 U/L 9 - 13   RADIOGRAPHIC STUDIES: CT CHEST W CONTRAST  Result Date: 01/02/2021 CLINICAL DATA:  Chronic cough, prostate carcinoma EXAM: CT CHEST WITH CONTRAST TECHNIQUE: Multidetector CT imaging of the chest was performed during intravenous contrast administration. CONTRAST:  3m ISOVUE-370 IOPAMIDOL (ISOVUE-370) INJECTION 76% COMPARISON:  04/02/2018 FINDINGS: Cardiovascular: Heart size normal. No pericardial effusion. Coronary calcifications. Aortic valve leaflet calcifications. Mediastinum/Nodes: Small hiatal hernia.  No mass or adenopathy. Lungs/Pleura: No pleural effusion. No pneumothorax. Linear scarring or subsegmental atelectasis in the inferior lingula and left lung base, new since previous. Upper Abdomen: No acute findings. Musculoskeletal: Degenerative changes throughout the visualized lower cervical and thoracic spine. T12 compression deformity appears mildly worsened since 07/21/2019. IMPRESSION: 1. Linear scarring or subsegmental atelectasis in the inferior lingula and left lung base, new since previous. 2. Interval worsening of T12 compression deformity since 07/21/2019. Electronically Signed   By: DLucrezia EuropeM.D.   On:  01/02/2021 15:15    ASSESSMENT & PLAN DCranston AtilesBasinger 87y.o. male with medical history significant for an unprovoked RLE DVT who presents for a follow up visit.  After review the labs, review the records, discussed with the patient the findings most consistent with an unprovoked right lower extremity DVT.  He is currently taking Eliquis therapy 5 mg p.o. twice daily and tolerating it well.  Unfortunately given the unprovoked nature of this clot the recommendation would be for lifelong anticoagulation.  Previously we discussed the risks and benefits of anticoagulation therapy and the plan moving forward.  Given the unprovoked nature of this clot we do not know what caused it and therefore the recommendation would be for indefinite anticoagulation.  He is at advanced age and unstable on his feet, however falls should not deter one from indefinite anticoagulation.  Given these findings I would f/u in 3 months time to re-evaluate the course of action moving forward.  He has no history of prior bleeds and is tolerating therapy well so far, therefore my preference would be for indefinite anticoagulation, but if the patient feels the medication is too expensive or if the bleeding risk is too high then d/c therapy would be a reasonable option.  The patient voices understanding of this plan moving forward.  # Unprovoked Acute Right Lower Extremity DVT --completed eliquis '5mg'$  BID PO x 6 months.  At this time he is agreeable to transitioning to maintenance dosing with Eliquis 2.5 mg twice daily indefinitely. --unfortunately given the unprovoked nature of this VTE the recommendation is for lifelong anticoagulation. Given his advanced age and unsteadiness on his feet I think it would be reasonable to have a discussion after the 6 month mark to determine the best course of action.  --no indication for a hypercoagulation workup as it would not change management --RTC in 6 months time to reassess   No orders of  the defined types were placed in this encounter.   All questions were answered. The patient knows to call  the clinic with any problems, questions or concerns.  A total of more than 30 minutes were spent on this encounter and over half of that time was spent on counseling and coordination of care as outlined above.   Ledell Peoples, MD Department of Hematology/Oncology [place] at [facility] Phone: [phone] Pager: [phone] Email: [email or web address]  01/11/2021 1:25 PM

## 2021-06-12 ENCOUNTER — Other Ambulatory Visit: Payer: Self-pay | Admitting: Adult Health

## 2021-06-12 DIAGNOSIS — G40209 Localization-related (focal) (partial) symptomatic epilepsy and epileptic syndromes with complex partial seizures, not intractable, without status epilepticus: Secondary | ICD-10-CM

## 2021-06-19 ENCOUNTER — Telehealth: Payer: Self-pay | Admitting: Adult Health

## 2021-06-19 NOTE — Telephone Encounter (Signed)
LVM and sent mychart msg asking pt to cb and r/s 2/23 appt- NP out.

## 2021-07-06 ENCOUNTER — Ambulatory Visit: Payer: Medicare Other | Admitting: Adult Health

## 2021-07-12 ENCOUNTER — Inpatient Hospital Stay: Payer: Medicare Other

## 2021-07-12 ENCOUNTER — Inpatient Hospital Stay: Payer: Medicare Other | Admitting: Hematology and Oncology

## 2021-07-24 ENCOUNTER — Other Ambulatory Visit (HOSPITAL_COMMUNITY): Payer: Self-pay | Admitting: Internal Medicine

## 2021-07-24 DIAGNOSIS — R600 Localized edema: Secondary | ICD-10-CM

## 2021-08-04 ENCOUNTER — Other Ambulatory Visit: Payer: Self-pay

## 2021-08-04 ENCOUNTER — Ambulatory Visit (HOSPITAL_COMMUNITY): Payer: Medicare Other | Attending: Internal Medicine

## 2021-08-04 DIAGNOSIS — R600 Localized edema: Secondary | ICD-10-CM

## 2021-08-04 LAB — ECHOCARDIOGRAM COMPLETE
AR max vel: 1.6 cm2
AV Area VTI: 1.51 cm2
AV Area mean vel: 1.49 cm2
AV Mean grad: 8 mmHg
AV Peak grad: 15.5 mmHg
Ao pk vel: 1.97 m/s
Area-P 1/2: 3.26 cm2
S' Lateral: 2.6 cm

## 2021-08-13 ENCOUNTER — Other Ambulatory Visit: Payer: Self-pay | Admitting: Hematology and Oncology

## 2021-08-13 DIAGNOSIS — I82411 Acute embolism and thrombosis of right femoral vein: Secondary | ICD-10-CM

## 2021-08-14 ENCOUNTER — Other Ambulatory Visit: Payer: Self-pay

## 2021-08-14 ENCOUNTER — Inpatient Hospital Stay: Payer: Medicare Other | Attending: Family Medicine

## 2021-08-14 ENCOUNTER — Inpatient Hospital Stay (HOSPITAL_BASED_OUTPATIENT_CLINIC_OR_DEPARTMENT_OTHER): Payer: Medicare Other | Admitting: Hematology and Oncology

## 2021-08-14 VITALS — BP 123/59 | HR 60 | Temp 97.5°F | Resp 18 | Wt 184.0 lb

## 2021-08-14 DIAGNOSIS — E785 Hyperlipidemia, unspecified: Secondary | ICD-10-CM | POA: Insufficient documentation

## 2021-08-14 DIAGNOSIS — I82411 Acute embolism and thrombosis of right femoral vein: Secondary | ICD-10-CM

## 2021-08-14 DIAGNOSIS — D509 Iron deficiency anemia, unspecified: Secondary | ICD-10-CM | POA: Insufficient documentation

## 2021-08-14 DIAGNOSIS — Z86718 Personal history of other venous thrombosis and embolism: Secondary | ICD-10-CM | POA: Insufficient documentation

## 2021-08-14 DIAGNOSIS — D500 Iron deficiency anemia secondary to blood loss (chronic): Secondary | ICD-10-CM

## 2021-08-14 DIAGNOSIS — Z7901 Long term (current) use of anticoagulants: Secondary | ICD-10-CM | POA: Insufficient documentation

## 2021-08-14 LAB — CBC WITH DIFFERENTIAL (CANCER CENTER ONLY)
Abs Immature Granulocytes: 0.07 10*3/uL (ref 0.00–0.07)
Basophils Absolute: 0.1 10*3/uL (ref 0.0–0.1)
Basophils Relative: 1 %
Eosinophils Absolute: 0.2 10*3/uL (ref 0.0–0.5)
Eosinophils Relative: 3 %
HCT: 34.7 % — ABNORMAL LOW (ref 39.0–52.0)
Hemoglobin: 11.4 g/dL — ABNORMAL LOW (ref 13.0–17.0)
Immature Granulocytes: 1 %
Lymphocytes Relative: 20 %
Lymphs Abs: 1.1 10*3/uL (ref 0.7–4.0)
MCH: 31.7 pg (ref 26.0–34.0)
MCHC: 32.9 g/dL (ref 30.0–36.0)
MCV: 96.4 fL (ref 80.0–100.0)
Monocytes Absolute: 0.8 10*3/uL (ref 0.1–1.0)
Monocytes Relative: 15 %
Neutro Abs: 3.4 10*3/uL (ref 1.7–7.7)
Neutrophils Relative %: 60 %
Platelet Count: 339 10*3/uL (ref 150–400)
RBC: 3.6 MIL/uL — ABNORMAL LOW (ref 4.22–5.81)
RDW: 12.2 % (ref 11.5–15.5)
WBC Count: 5.7 10*3/uL (ref 4.0–10.5)
nRBC: 0 % (ref 0.0–0.2)

## 2021-08-14 LAB — CMP (CANCER CENTER ONLY)
ALT: 12 U/L (ref 0–44)
AST: 16 U/L (ref 15–41)
Albumin: 3.9 g/dL (ref 3.5–5.0)
Alkaline Phosphatase: 66 U/L (ref 38–126)
Anion gap: 8 (ref 5–15)
BUN: 23 mg/dL (ref 8–23)
CO2: 27 mmol/L (ref 22–32)
Calcium: 9.2 mg/dL (ref 8.9–10.3)
Chloride: 102 mmol/L (ref 98–111)
Creatinine: 0.84 mg/dL (ref 0.61–1.24)
GFR, Estimated: >60 mL/min (ref >60)
Glucose, Bld: 113 mg/dL — ABNORMAL HIGH (ref 70–99)
Potassium: 4.4 mmol/L (ref 3.5–5.1)
Sodium: 137 mmol/L (ref 135–145)
Total Bilirubin: 0.3 mg/dL (ref 0.3–1.2)
Total Protein: 7.2 g/dL (ref 6.5–8.1)

## 2021-08-14 LAB — IRON AND IRON BINDING CAPACITY (CC-WL,HP ONLY)
Iron: 63 ug/dL (ref 45–182)
Saturation Ratios: 20 % (ref 17.9–39.5)
TIBC: 318 ug/dL (ref 250–450)
UIBC: 255 ug/dL (ref 117–376)

## 2021-08-14 LAB — FERRITIN: Ferritin: 581 ng/mL — ABNORMAL HIGH (ref 24–336)

## 2021-08-14 NOTE — Progress Notes (Signed)
?[place] ?Telephone:(336) [phone]   [phone] ? ?PROGRESS NOTE ? ?Patient Care Team: ?Sueanne Margarita, DO as PCP - General (Internal Medicine) ? ?Hematological/Oncological History ?# Unprovoked Acute Lower Extremity DVT ?06/21/2020: Lower extremity US shows findings consistent with acute deep vein thrombosis involving the right  ?posterior tibial veins, right peroneal veins, right popliteal vein, and  ?right femoral vein. Started on Eliqiuis '5mg'$  BID.  ?08/17/2020: establish care with Dr. Lorenso Courier  ?  ?Interval History:  ?Kenneth Bowen 88 y.o. male with medical history significant for an unprovoked RLE DVT who presents for a follow up visit. The patient's last visit was on 01/11/2021. In the interim since the last visit he has continued to faithfully take his Eliquis therapy. ? ?On exam today [name] reports he does continue to have some discomfort in his legs bilaterally.  He reports that he does occasionally get stiffness and pain in his knees bilaterally, predominantly his right knee.  He notes that he is a little better now.  He reports that he does his best to go to the exercise room and use machines to "straighten out his legs".  He notes his stiffness has lessened in the last couple weeks.  He notes he is not having any issues with shortness of breath or fatigue.  He is taking his Eliquis faithfully twice daily with no bleeding, bruising, or dark stools.  He notes that there is no problem with the cost of the medication.  He currently denies any fevers, chills, sweats, nausea, vomiting or diarrhea.  A full 10 point ROS is listed below ? ?MEDICAL HISTORY:  ?Past Medical History:  ?Diagnosis Date  ? Colon polyps   ? Diverticulosis   ? Prostate cancer ([place]) 2015  ? radiation finished Nov 2015  ? Seizures ([place]) 07/2016  ? ? ?SURGICAL HISTORY: ?Past Surgical History:  ?Procedure Laterality Date  ? colonscopy    ? INGUINAL HERNIA REPAIR    ? KNEE SURGERY    ? PROSTATE BIOPSY     ? ? ?SOCIAL HISTORY: ?Social History  ? ?Socioeconomic History  ? Marital status: Married  ?  Spouse name: Not on file  ? Number of children: Not on file  ? Years of education: Not on file  ? Highest education level: Not on file  ?Occupational History  ? Occupation: civil Chief Financial Officer  ?Tobacco Use  ? Smoking status: Never  ? Smokeless tobacco: Never  ?Substance and Sexual Activity  ? Alcohol use: Yes  ?  Alcohol/week: 7.0 standard drinks  ?  Types: 7 Glasses of wine per week  ?  Comment: 4 oz nightly  ? Drug use: No  ? Sexual activity: Yes  ?Other Topics Concern  ? Not on file  ?Social History Narrative  ? Lives with wife at [facility] in [place].  Uses cane.    ? ?Social Determinants of Health  ? ?Financial Resource Strain: Not on file  ?Food Insecurity: Not on file  ?Transportation Needs: Not on file  ?Physical Activity: Not on file  ?Stress: Not on file  ?Social Connections: Not on file  ?Intimate Partner Violence: Not on file  ? ? ?FAMILY HISTORY: ?Family History  ?Problem Relation Age of Onset  ? Alcoholism Father   ? Cancer Neg Hx   ? Colon cancer Neg Hx   ? ? ?ALLERGIES:  has No Known Allergies. ? ?MEDICATIONS:  ?Current Outpatient Medications  ?Medication Sig Dispense Refill  ? ALPRAZolam (XANAX) 0.5 MG tablet Take 0.5  mg by mouth daily as needed for anxiety.    ? apixaban (ELIQUIS) 2.5 MG TABS tablet Take 1 tablet (2.5 mg total) by mouth 2 (two) times daily. 180 tablet 2  ? ARTIFICIAL TEAR OP Place 1 drop into both eyes daily as needed. For dry eyes    ? cholecalciferol (VITAMIN D) 1000 UNITS tablet Take 2,000 Units by mouth daily.     ? desoximetasone (TOPICORT) 0.25 % cream Apply 1 application topically 2 (two) times daily as needed (rash).     ? divalproex (DEPAKOTE ER) 500 MG 24 hr tablet TAKE 1 TABLET (500 MG TOTAL) BY MOUTH DAILY. 90 tablet 0  ? furosemide (LASIX) 20 MG tablet Take 20 mg by mouth daily.     ? hydrocortisone 2.5 % cream Apply 1 application topically daily as needed for  other.    ? mometasone (ELOCON) 0.1 % ointment Apply 1 application topically 2 (two) times daily as needed (rash).     ? Multiple Vitamin (MULTIVITAMIN) tablet Take 1 tablet by mouth daily.    ? omeprazole (PRILOSEC) 20 MG capsule Take 20 mg by mouth daily as needed (acid reflux).     ? tamsulosin (FLOMAX) 0.4 MG CAPS capsule TAKE 1 CAPSULE (0.4 MG TOTAL) BY MOUTH DAILY AFTER SUPPER. 30 capsule 5  ? topiramate (TOPAMAX) 50 MG tablet Take 1 tablet (50 mg total) by mouth at bedtime. 90 tablet 3  ? traZODone (DESYREL) 50 MG tablet Take 25 mg by mouth at bedtime.     ? triamcinolone cream (KENALOG) 0.1 % Apply 1 application topically 2 (two) times daily as needed (rash).     ? ?No current facility-administered medications for this visit.  ? ? ?REVIEW OF SYSTEMS:   ?Constitutional: ( - ) fevers, ( - )  chills , ( - ) night sweats ?Eyes: ( - ) blurriness of vision, ( - ) double vision, ( - ) watery eyes ?Ears, nose, mouth, throat, and face: ( - ) mucositis, ( - ) sore throat ?Respiratory: ( - ) cough, ( - ) dyspnea, ( - ) wheezes ?Cardiovascular: ( - ) palpitation, ( - ) chest discomfort, ( - ) lower extremity swelling ?Gastrointestinal:  ( - ) nausea, ( - ) heartburn, ( - ) change in bowel habits ?Skin: ( - ) abnormal skin rashes ?Lymphatics: ( - ) new lymphadenopathy, ( - ) easy bruising ?Neurological: ( - ) numbness, ( - ) tingling, ( - ) new weaknesses ?Behavioral/Psych: ( - ) mood change, ( - ) new changes  ?All other systems were reviewed with the patient and are negative. ? ?PHYSICAL EXAMINATION: ? ?Vitals:  ? 08/14/21 1518  ?BP: (!) 123/59  ?Pulse: 60  ?Resp: 18  ?Temp: (!) 97.5 ?F (36.4 ?C)  ?SpO2: 98%  ? ? ?Filed Weights  ? 08/14/21 1518  ?Weight: 184 lb (83.5 kg)  ? ?GENERAL: Well-appearing elderly Caucasian male, alert, no distress and comfortable ?SKIN: skin color, texture, turgor are normal, no rashes or significant lesions ?EYES: conjunctiva are pink and non-injected, sclera clear ?LUNGS: clear to  auscultation and percussion with normal breathing effort ?HEART: regular rate & rhythm and no murmurs and no lower extremity edema ?Musculoskeletal: no cyanosis of digits and no clubbing  ?PSYCH: alert & oriented x 3, fluent speech ?NEURO: no focal motor/sensory deficits ? ?LABORATORY DATA:  ?I have reviewed the data as listed ? ?  Latest Ref Rng & Units 08/14/2021  ?  2:46 PM 01/11/2021  ?  1:59 PM  09/15/2020  ?  2:39 PM  ?CBC  ?WBC 4.0 - 10.5 K/uL 5.7   5.2   5.8    ?Hemoglobin 13.0 - 17.0 g/dL 11.4   11.9   12.3    ?Hematocrit 39.0 - 52.0 % 34.7   35.4   36.4    ?Platelets 150 - 400 K/uL 339   255   254    ? ? ? ?  Latest Ref Rng & Units 08/14/2021  ?  2:46 PM 01/11/2021  ?  1:59 PM 09/15/2020  ?  2:39 PM  ?CMP  ?Glucose 70 - 99 mg/dL 113   115   108    ?BUN 8 - 23 mg/dL '23   16   16    '$ ?Creatinine 0.61 - 1.24 mg/dL 0.84   0.94   0.88    ?Sodium 135 - 145 mmol/L 137   139   139    ?Potassium 3.5 - 5.1 mmol/L 4.4   4.4   4.5    ?Chloride 98 - 111 mmol/L 102   105   106    ?CO2 22 - 32 mmol/L '27   26   26    '$ ?Calcium 8.9 - 10.3 mg/dL 9.2   9.1   9.0    ?Total Protein 6.5 - 8.1 g/dL 7.2   6.8   7.1    ?Total Bilirubin 0.3 - 1.2 mg/dL 0.3   0.4   <0.2    ?Alkaline Phos 38 - 126 U/L 66   56   63    ?AST 15 - 41 U/L '16   15   17    '$ ?ALT 0 - 44 U/L '12   13   9    '$ ? ?RADIOGRAPHIC STUDIES: ?ECHOCARDIOGRAM COMPLETE ? ?Result Date: 08/04/2021 ?   ECHOCARDIOGRAM REPORT   Patient Name:   Kenneth Bowen Date of Exam: 08/04/2021 Medical Rec #:  [id number]         Height:       70.0 in Accession #:    [id number]        Weight:       178.9 lb Date of Birth:  03/08/1931          BSA:          1.991 m? Patient Age:    33 years          BP:           132/69 mmHg Patient Gender: M                 HR:           78 bpm. Exam Location:  [address] Procedure: 2D Echo, Cardiac Doppler and Color Doppler Indications:    R60.0 Lower extremity edema  History:        Patient has prior history of Echocardiogram examinations, most                 recent  07/13/2016. TIA, Signs/Symptoms:Syncope; Risk                 Factors:Dyslipidemia. Seizures. History of DVT.  Sonographer:    Diamond Nickel RCS Referring Phys: [place]  1. Left ventricular ejection fract

## 2021-08-21 ENCOUNTER — Encounter (HOSPITAL_COMMUNITY): Payer: Medicare Other

## 2021-08-21 ENCOUNTER — Ambulatory Visit (HOSPITAL_COMMUNITY)
Admission: RE | Admit: 2021-08-21 | Discharge: 2021-08-21 | Disposition: A | Discharge status: Home / Self Care | Payer: Medicare Other | Source: Ambulatory Visit | Attending: Hematology and Oncology | Admitting: Hematology and Oncology

## 2021-08-21 DIAGNOSIS — I82411 Acute embolism and thrombosis of right femoral vein: Secondary | ICD-10-CM | POA: Diagnosis not present

## 2021-08-21 NOTE — Progress Notes (Signed)
Bilateral lower extremity venous duplex has been completed. ?Preliminary results can be found in CV Proc through chart review.  ?Results were given to [facility] at Dr. Libby Maw office. ? ?08/21/21 1:50 PM ?Carlos Levering RVT   ?

## 2021-08-22 ENCOUNTER — Encounter: Payer: Self-pay | Admitting: Adult Health

## 2021-08-22 ENCOUNTER — Ambulatory Visit (INDEPENDENT_AMBULATORY_CARE_PROVIDER_SITE_OTHER): Payer: Medicare Other | Admitting: Adult Health

## 2021-08-22 VITALS — BP 126/72 | HR 60 | Ht 70.0 in | Wt 183.0 lb

## 2021-08-22 DIAGNOSIS — R259 Unspecified abnormal involuntary movements: Secondary | ICD-10-CM

## 2021-08-22 DIAGNOSIS — G2581 Restless legs syndrome: Secondary | ICD-10-CM

## 2021-08-22 DIAGNOSIS — G40209 Localization-related (focal) (partial) symptomatic epilepsy and epileptic syndromes with complex partial seizures, not intractable, without status epilepticus: Secondary | ICD-10-CM

## 2021-08-22 MED ORDER — DIVALPROEX SODIUM ER 500 MG PO TB24: 500.0000 mg | ORAL_TABLET | Freq: Every day | ORAL | 3 refills | Status: DC

## 2021-08-22 MED ORDER — TOPIRAMATE 50 MG PO TABS: 50.0000 mg | ORAL_TABLET | Freq: Every evening | ORAL | 3 refills | Status: DC

## 2021-08-22 NOTE — Patient Instructions (Addendum)
Your Plan: ? ?Continue Depakote and topiramate at current dosages ? ? ? ?Follow up in 1 year or call earlier if needed ? ? ? ? ?Thank you for coming to see [place] at [facility]. I hope we have been able to provide you high quality care today. ? ?You may receive a patient satisfaction survey over the next few weeks. We would appreciate your feedback and comments so that we may continue to improve ourselves and the health of our patients. ? ?

## 2021-08-22 NOTE — Progress Notes (Signed)
?[facility] ?[address] ?. [place] [address] ?[phone] ? ?     OFFICE FOLLOW-UP NOTE ? ?Kenneth Bowen ?Date of Birth:  1930-11-01 ?Medical Record Number:  [id number]  ? ? ?Primary neurologist: Dr. Leonie Man ? ?Chief Complaint  ?Kenneth Bowen presents with  ? Follow-up  ?  Rm 3 here for  1 year f/u. Pt reports he has been doing ok   ?  ? ? ?HPI: ? ? ? Initial visit 02/10/2018 Dr. Leonie Man: Kenneth Bowen is a 40 year Caucasian male seen today for first office follow-up visit for in-hospital with admission in March 2018 for episode of brief loss of awareness. History is obtained from the Kenneth Bowen, review of electronic medical records and have personally reviewed imaging for films. Kenneth Bowen is an 88 y.o. male who was  at home earlier and wife saw him standing up against the wall unresponsive, staring and had a bowel movement.    He was able to share what his wife described as another similar episodes  Two weeks ago, Kenneth Bowen was leaning over to pick up something and says he may have strained to reach down to get the item.  He says his head was "all the way down."  When he sat up, he apparently became dizzy and had blurred vision.  He then became confused and his wife told him that for 20 minutes he "reached for things that were not there and acted confused."  This lasted for 20 minutes and he returned to normal by the time EMS arrived.  The second event occurred prior to admission.  Kenneth Bowen was sitting upright and did not have a positional change or strain to reach for something.  He had sudden onset of dizziness.  Like the time two weeks before, his wife assisted him to another room but he was confused again.  This time the Kenneth Bowen did not "return to clear understanding" until he was in the ambulance.  This was approximately 30-40 minutes after onset.  When asked if this has happened before, Kenneth Bowen recalled an event 10 years prior when he suddenly lost his words for a few minutes and an  event 15 years prior when he had trouble understanding the words he was reading.  However, no confusion spells of note. CT scan of the brain on admission was unremarkable and CT angiogram showed no large vessel stenosis or occlusion. EEG was obtained which showed no definite seizure activity. Transthoracic echo showed normal ejection fraction. LDL cholesterol was 55 mg percent. Hemoglobin A1c was 5.8. Kenneth Bowen was started on a trial of Depakote ER 500 mg daily. He states he tolerated well but he ran out of a week ago has not filled it yet. He complained of increased frequency of soft stools about 3 times a day he felt this may be related to Depakote. He has had no further recurrent episodes. Denies any prior history of significant head injury with loss of consciousness, childhood epilepsy or other seizures. ?Update 04/28/2018 : He returns for follow-up after last visit 2-1/2 months ago.  He had unexplained fall backwards when he was getting up quickly.  He saw his primary physician Dr. Nyoka Cowden who recommended he see me for further evaluation.  He states that he has had several such falls following his initial fall in 2017 when he fell and sustained compression fracture of 2 vertebrae in the lumbar spine.  Since then he is learned to get up slowly but most of his falls have been  backwards.  He denies any passing out or severe pain leading to the fall.  Kenneth Bowen was seen last week in our office in September 2019 at that time he had comprehensive metabolic panel as well as CBC drawn which were unremarkable.  He remains on Depakote ER 500 daily and has had no further episodes of seizure-like events.  He is been bothered by swelling in his feet and Dr. Nyoka Cowden feels this may be related to Depakote.  He has been started on Lasix 20 mg but so far has not shown significant improvement.  He denies any weight gain, tiredness, tremors or dizziness.  Kenneth Bowen denies any leg pain, tingling and numbness.  He does have a feeling of  restlessness particularly at night and had trouble sleeping but since he has been taking Xanax 1 mg at night as well as trazodone 50 mg the symptoms are under control.  He has not tried other medications for restless legs like Topamax, gabapentin, Requip or Lyrica.  He denies any tremors in his hands, drooling of saliva, ,bradykinesia, festination no stooped posture.  He still has chronic back pain and his walking is affected by that and he has to favor his back while getting up and sitting down.  He denies radicular pain or numbness. ?Update 02/16/2019 : He returns for follow-up after last visit 10 months ago.  He states is doing well.  Continues to have gait and balance difficulties with tendency to fall backwards.  He is at only one fall as he is quite careful with no major injuries.  Did undergo MRI scan of the brain on 05/12/2018 which I personally reviewed shows changes of generalized cerebral atrophy as well as changes of chronic microvascular disease.  No significant change compared with previous MRI from 07/18/2016.  The Kenneth Bowen states he is tolerating Topamax 50 mg at bedtime quite well and seems to have helped his restless leg syndrome.  He still however needs to take Xanax as it helps him sleep.  He denies significant tremor or bradykinesia with drooling of saliva.  He feels overall his gait balance and difficulty is about unchanged  ?Update 09/03/2019: He returns for follow-up after last visit 6 months ago.  He continues to have frequent falls and has had compression fractures of his spine.  He saw Dr. Gildardo Griffes from [facility] who did some x-rays and referred him to Dr. Nelva Bush who did MRI scan lumbar spine on 07/16/2019 which shows acute L5 compression fracture as well as multilevel chronic degenerative changes and compression fractures at T11 and L3 with moderate to severe right L4-5 neural foraminal stenosis and moderate to severe left L3-L4 foraminal stenosis.  Kenneth Bowen has been referred to physical  therapy and states he has benefited and is able to pull his shoulders back and look up better and walk with his feet more apart.  He states he has been using a wheeled walker and has not had any falls in the last 3 weeks.  He does not have any resting or action tremor or rigidity.  He states his restless legs are well controlled on the current dose of Topamax at night.  He is also had no episodes of altered awareness or seizure-like activity since he started taking Depakote several years ago.  He has no new complaints. ? ?Update 07/04/2020 JM: Kenneth Bowen is being seen for routine follow-up previously seen approximately 10 months ago by Dr. Leonie Man.  He has remained on Depakote tolerating without side effects and denies any episodes  of altered awareness or seizure-like activity.  He remains on topiramate for adequate control of RLS tolerating without side effects.  Continues to have gait difficulty with use of RW and 1 fall in the beginning of February thankfully without injury.  He has been doing exercises at his [facility] for patients with Parkinson's disease which has been improving overall gait.  He denies any resting or action tremor or drooling and mild bradykinesia stable without worsening.  Recently diagnosed with acute RLE DVT in femoral, popliteal and tibial arteries on 2/9.  Eliquis initiated by PCP which she has remained on without bleeding or bruising.  No further concerns at this time. ? ? ?Update 08/22/2021 JM: Kenneth Bowen returns for follow-up visit after prior visit over 1 year ago unaccompanied.  Overall stable from neurological standpoint since prior visit.  Remains on Depakote without side effects and no additional seizure-like activity.  Remains on topiramate without side effects with adequate control of RLS. He notes continued gait impairment and unsteadiness which has been stable since prior visit, continued use of rollator walker, denies any recent falls.  He continues to participate in parkinson's class  at [facility]. Denies any action or resting tremor, freezing gait or cognitive changes. He does note occasional drooling but denies any significant swallowing difficulties (only when eats too quickly to tries to eat

## 2021-08-23 NOTE — Progress Notes (Signed)
I agree with the above plan 

## 2021-11-28 ENCOUNTER — Other Ambulatory Visit: Payer: Self-pay | Admitting: Hematology and Oncology

## 2022-02-14 ENCOUNTER — Inpatient Hospital Stay: Payer: Medicare Other

## 2022-02-14 ENCOUNTER — Other Ambulatory Visit: Payer: Self-pay

## 2022-02-14 ENCOUNTER — Other Ambulatory Visit: Payer: Self-pay | Admitting: *Deleted

## 2022-02-14 ENCOUNTER — Inpatient Hospital Stay: Payer: Medicare Other | Attending: Hematology and Oncology | Admitting: Hematology and Oncology

## 2022-02-14 VITALS — BP 128/57 | HR 60 | Temp 97.9°F | Resp 15 | Wt 185.3 lb

## 2022-02-14 DIAGNOSIS — I82411 Acute embolism and thrombosis of right femoral vein: Secondary | ICD-10-CM | POA: Diagnosis not present

## 2022-02-14 DIAGNOSIS — R5383 Other fatigue: Secondary | ICD-10-CM | POA: Insufficient documentation

## 2022-02-14 DIAGNOSIS — D500 Iron deficiency anemia secondary to blood loss (chronic): Secondary | ICD-10-CM

## 2022-02-14 DIAGNOSIS — I82401 Acute embolism and thrombosis of unspecified deep veins of right lower extremity: Secondary | ICD-10-CM | POA: Insufficient documentation

## 2022-02-14 DIAGNOSIS — Z79899 Other long term (current) drug therapy: Secondary | ICD-10-CM | POA: Diagnosis not present

## 2022-02-14 DIAGNOSIS — Z7901 Long term (current) use of anticoagulants: Secondary | ICD-10-CM | POA: Insufficient documentation

## 2022-02-14 LAB — IRON AND IRON BINDING CAPACITY (CC-WL,HP ONLY)
Iron: 124 ug/dL (ref 45–182)
Saturation Ratios: 44 % — ABNORMAL HIGH (ref 17.9–39.5)
TIBC: 283 ug/dL (ref 250–450)
UIBC: 159 ug/dL

## 2022-02-14 LAB — CMP (CANCER CENTER ONLY)
ALT: 17 U/L (ref 0–44)
AST: 19 U/L (ref 15–41)
Albumin: 3.3 g/dL — ABNORMAL LOW (ref 3.5–5.0)
Alkaline Phosphatase: 47 U/L (ref 38–126)
Anion gap: 4 — ABNORMAL LOW (ref 5–15)
BUN: 29 mg/dL — ABNORMAL HIGH (ref 8–23)
CO2: 27 mmol/L (ref 22–32)
Calcium: 8.4 mg/dL — ABNORMAL LOW (ref 8.9–10.3)
Chloride: 102 mmol/L (ref 98–111)
Creatinine: 1.08 mg/dL (ref 0.61–1.24)
GFR, Estimated: >60 mL/min (ref >60)
Glucose, Bld: 91 mg/dL (ref 70–99)
Potassium: 4.2 mmol/L (ref 3.5–5.1)
Sodium: 133 mmol/L — ABNORMAL LOW (ref 135–145)
Total Bilirubin: 0.6 mg/dL (ref 0.3–1.2)
Total Protein: 6.2 g/dL — ABNORMAL LOW (ref 6.5–8.1)

## 2022-02-14 LAB — CBC WITH DIFFERENTIAL (CANCER CENTER ONLY)
Abs Immature Granulocytes: 0.28 10*3/uL — ABNORMAL HIGH (ref 0.00–0.07)
Basophils Absolute: 0.1 10*3/uL (ref 0.0–0.1)
Basophils Relative: 1 %
Eosinophils Absolute: 0.1 10*3/uL (ref 0.0–0.5)
Eosinophils Relative: 2 %
HCT: 32.2 % — ABNORMAL LOW (ref 39.0–52.0)
Hemoglobin: 11.2 g/dL — ABNORMAL LOW (ref 13.0–17.0)
Immature Granulocytes: 5 %
Lymphocytes Relative: 25 %
Lymphs Abs: 1.5 10*3/uL (ref 0.7–4.0)
MCH: 33 pg (ref 26.0–34.0)
MCHC: 34.8 g/dL (ref 30.0–36.0)
MCV: 95 fL (ref 80.0–100.0)
Monocytes Absolute: 0.8 10*3/uL (ref 0.1–1.0)
Monocytes Relative: 13 %
Neutro Abs: 3.3 10*3/uL (ref 1.7–7.7)
Neutrophils Relative %: 54 %
Platelet Count: 293 10*3/uL (ref 150–400)
RBC: 3.39 MIL/uL — ABNORMAL LOW (ref 4.22–5.81)
RDW: 12.7 % (ref 11.5–15.5)
WBC Count: 6.1 10*3/uL (ref 4.0–10.5)
nRBC: 0 % (ref 0.0–0.2)

## 2022-02-14 LAB — RETIC PANEL
Immature Retic Fract: 7.4 % (ref 2.3–15.9)
RBC.: 3.39 MIL/uL — ABNORMAL LOW (ref 4.22–5.81)
Retic Count, Absolute: 45.4 10*3/uL (ref 19.0–186.0)
Retic Ct Pct: 1.3 % (ref 0.4–3.1)
Reticulocyte Hemoglobin: 36.7 pg (ref >27.9)

## 2022-02-14 LAB — FERRITIN: Ferritin: 506 ng/mL — ABNORMAL HIGH (ref 24–336)

## 2022-02-14 NOTE — Progress Notes (Signed)
[facility] [phone]   [phone]  PROGRESS NOTE  Patient Care Team: Sueanne Margarita, DO as PCP - General (Internal Medicine)  Hematological/Oncological History # Unprovoked Acute Lower Extremity DVT 06/21/2020: Lower extremity US shows findings consistent with acute deep vein thrombosis involving the right  posterior tibial veins, right peroneal veins, right popliteal vein, and  right femoral vein. Started on Eliqiuis '5mg'$  BID.  08/17/2020: establish care with Dr. Lorenso Courier    Interval History:  Kenneth Bowen 88 y.o. male with medical history significant for an unprovoked RLE DVT who presents for a follow up visit. The patient's last visit was on 08/14/2021. In the interim since the last visit he has continued to faithfully take his Eliquis therapy.  On exam today [name] reports he has been well overall interim since her last visit.  Unfortunately did have recently have a fall 3 to 4 weeks ago.  He thinks he may have pulled "a ligament" when he had this fall.  He notes he continues taking his Eliquis 2.5 mg twice daily without difficulty.  Is not causing any bleeding, bruising, or dark stools.  He notes that he did have some recent increase in swelling in his legs and increase his Lasix from 20 mg to 40 mg.  He also has not been taking his iron pills as prescribed but notes that he is willing to restart these.  He notes his energy levels are "not that good".  He notes he is doing his best to try to stay off his legs.  His Eliquis medication fortunately is only $10 per month and he is able to afford this easily.  He currently denies any fevers, chills, sweats, nausea, vomiting or diarrhea.  A full 10 point ROS is listed below  MEDICAL HISTORY:  Past Medical History:  Diagnosis Date   Colon polyps    Diverticulosis    Prostate cancer ([place]) 2015   radiation finished Nov 2015   Seizures ([facility]) 07/2016    SURGICAL HISTORY: Past Surgical History:   Procedure Laterality Date   colonscopy     INGUINAL HERNIA REPAIR     KNEE SURGERY     PROSTATE BIOPSY      SOCIAL HISTORY: Social History   Socioeconomic History   Marital status: Married    Spouse name: Not on file   Number of children: Not on file   Years of education: Not on file   Highest education level: Not on file  Occupational History   Occupation: civil Chief Financial Officer  Tobacco Use   Smoking status: Never   Smokeless tobacco: Never  Substance and Sexual Activity   Alcohol use: Yes    Alcohol/week: 7.0 standard drinks of alcohol    Types: 7 Glasses of wine per week    Comment: 4 oz nightly   Drug use: No   Sexual activity: Yes  Other Topics Concern   Not on file  Social History Narrative   Lives with wife at [facility] in [place].  Uses cane.     Social Determinants of Health   Financial Resource Strain: Not on file  Food Insecurity: Not on file  Transportation Needs: Not on file  Physical Activity: Not on file  Stress: Not on file  Social Connections: Not on file  Intimate Partner Violence: Not on file    FAMILY HISTORY: Family History  Problem Relation Age of Onset   Alcoholism Father    Cancer Neg Hx    Colon  cancer Neg Hx     ALLERGIES:  has No Known Allergies.  MEDICATIONS:  Current Outpatient Medications  Medication Sig Dispense Refill   ALPRAZolam (XANAX) 0.5 MG tablet Take 0.5 mg by mouth.     ARTIFICIAL TEAR OP Place 1 drop into both eyes daily as needed. For dry eyes     cholecalciferol (VITAMIN D) 1000 UNITS tablet Take 2,000 Units by mouth daily.      desoximetasone (TOPICORT) 0.25 % cream Apply 1 application topically 2 (two) times daily as needed (rash).      divalproex (DEPAKOTE ER) 500 MG 24 hr tablet Take 1 tablet (500 mg total) by mouth daily. 90 tablet 3   ELIQUIS 2.5 MG TABS tablet TAKE 1 TABLET BY MOUTH TWICE A DAY 180 tablet 2   furosemide (LASIX) 20 MG tablet Take 20 mg by mouth daily.      hydrocortisone 2.5 %  cream Apply 1 application. topically daily as needed for other. Skin irritation     mometasone (ELOCON) 0.1 % ointment Apply 1 application topically 2 (two) times daily as needed (rash).      Multiple Vitamin (MULTIVITAMIN) tablet Take 1 tablet by mouth daily.     omeprazole (PRILOSEC) 20 MG capsule Take 20 mg by mouth daily as needed (acid reflux).      tamsulosin (FLOMAX) 0.4 MG CAPS capsule TAKE 1 CAPSULE (0.4 MG TOTAL) BY MOUTH DAILY AFTER SUPPER. 30 capsule 5   topiramate (TOPAMAX) 50 MG tablet Take 1 tablet (50 mg total) by mouth at bedtime. 90 tablet 3   traZODone (DESYREL) 50 MG tablet Take 25 mg by mouth at bedtime.      triamcinolone cream (KENALOG) 0.1 % Apply 1 application topically 2 (two) times daily as needed (rash).      No current facility-administered medications for this visit.    REVIEW OF SYSTEMS:   Constitutional: ( - ) fevers, ( - )  chills , ( - ) night sweats Eyes: ( - ) blurriness of vision, ( - ) double vision, ( - ) watery eyes Ears, nose, mouth, throat, and face: ( - ) mucositis, ( - ) sore throat Respiratory: ( - ) cough, ( - ) dyspnea, ( - ) wheezes Cardiovascular: ( - ) palpitation, ( - ) chest discomfort, ( - ) lower extremity swelling Gastrointestinal:  ( - ) nausea, ( - ) heartburn, ( - ) change in bowel habits Skin: ( - ) abnormal skin rashes Lymphatics: ( - ) new lymphadenopathy, ( - ) easy bruising Neurological: ( - ) numbness, ( - ) tingling, ( - ) new weaknesses Behavioral/Psych: ( - ) mood change, ( - ) new changes  All other systems were reviewed with the patient and are negative.  PHYSICAL EXAMINATION:  Vitals:   02/14/22 1445  BP: (!) 128/57  Pulse: 60  Resp: 15  Temp: 97.9 F (36.6 C)  SpO2: 96%    Filed Weights   02/14/22 1445  Weight: 185 lb 4.8 oz (84.1 kg)   GENERAL: Well-appearing elderly Caucasian male, alert, no distress and comfortable SKIN: skin color, texture, turgor are normal, no rashes or significant lesions EYES:  conjunctiva are pink and non-injected, sclera clear LUNGS: clear to auscultation and percussion with normal breathing effort HEART: regular rate & rhythm and no murmurs and no lower extremity edema Musculoskeletal: no cyanosis of digits and no clubbing  PSYCH: alert & oriented x 3, fluent speech NEURO: no focal motor/sensory deficits  LABORATORY DATA:  I  have reviewed the data as listed    Latest Ref Rng & Units 02/14/2022    2:16 PM 08/14/2021    2:46 PM 01/11/2021    1:59 PM  CBC  WBC 4.0 - 10.5 K/uL 6.1  5.7  5.2   Hemoglobin 13.0 - 17.0 g/dL 11.2  11.4  11.9   Hematocrit 39.0 - 52.0 % 32.2  34.7  35.4   Platelets 150 - 400 K/uL 293  339  255        Latest Ref Rng & Units 02/14/2022    2:16 PM 08/14/2021    2:46 PM 01/11/2021    1:59 PM  CMP  Glucose 70 - 99 mg/dL 91  113  115   BUN 8 - 23 mg/dL '29  23  16   '$ Creatinine 0.61 - 1.24 mg/dL 1.08  0.84  0.94   Sodium 135 - 145 mmol/L 133  137  139   Potassium 3.5 - 5.1 mmol/L 4.2  4.4  4.4   Chloride 98 - 111 mmol/L 102  102  105   CO2 22 - 32 mmol/L '27  27  26   '$ Calcium 8.9 - 10.3 mg/dL 8.4  9.2  9.1   Total Protein 6.5 - 8.1 g/dL 6.2  7.2  6.8   Total Bilirubin 0.3 - 1.2 mg/dL 0.6  0.3  0.4   Alkaline Phos 38 - 126 U/L 47  66  56   AST 15 - 41 U/L '19  16  15   '$ ALT 0 - 44 U/L '17  12  13    '$ RADIOGRAPHIC STUDIES: No results found.  ASSESSMENT & PLAN DAVON ABDELAZIZ 88 y.o. male with medical history significant for an unprovoked RLE DVT who presents for a follow up visit.  After review the labs, review the records, discussed with the patient the findings most consistent with an unprovoked right lower extremity DVT.  He is currently taking Eliquis therapy 5 mg p.o. twice daily and tolerating it well.  Unfortunately given the unprovoked nature of this clot the recommendation would be for lifelong anticoagulation.  Previously we discussed the risks and benefits of anticoagulation therapy and the plan moving forward.  Given the  unprovoked nature of this clot we do not know what caused it and therefore the recommendation would be for indefinite anticoagulation.  He is at advanced age and unstable on his feet, however falls should not deter one from indefinite anticoagulation.  Given these findings I would f/u in 3 months time to re-evaluate the course of action moving forward.  He has no history of prior bleeds and is tolerating therapy well so far, therefore my preference would be for indefinite anticoagulation, but if the patient feels the medication is too expensive or if the bleeding risk is too high then d/c therapy would be a reasonable option.  The patient voices understanding of this plan moving forward.  # Unprovoked Acute Right Lower Extremity DVT --completed eliquis '5mg'$  BID PO x 6 months.  Continue maintenance dosing with Eliquis 2.5 mg twice daily indefinitely. --unfortunately given the unprovoked nature of this VTE the recommendation is for lifelong anticoagulation. --no indication for a hypercoagulation workup as it would not change management --labs today show white blood cell count 6.1, hemoglobin 11.2, MCV 95, and platelets of 293. --RTC in 6 months time to reassess   No orders of the defined types were placed in this encounter.  All questions were answered. The patient knows to call the clinic  with any problems, questions or concerns.  A total of more than 30 minutes were spent on this encounter and over half of that time was spent on counseling and coordination of care as outlined above.   Ledell Peoples, MD Department of Hematology/Oncology [place] at [facility] Phone: [phone] Pager: [phone] Email: [email or web address]  02/14/2022 6:19 PM

## 2022-05-14 DIAGNOSIS — Z950 Presence of cardiac pacemaker: Secondary | ICD-10-CM

## 2022-05-14 HISTORY — DX: Presence of cardiac pacemaker: Z95.0

## 2022-06-02 ENCOUNTER — Emergency Department (HOSPITAL_COMMUNITY): Payer: Medicare Other

## 2022-06-02 ENCOUNTER — Other Ambulatory Visit (HOSPITAL_COMMUNITY): Payer: No Typology Code available for payment source

## 2022-06-02 ENCOUNTER — Inpatient Hospital Stay (HOSPITAL_COMMUNITY)
Admission: EM | Admit: 2022-06-02 | Discharge: 2022-06-09 | DRG: 242 | Disposition: A | Discharge status: Skilled Nursing Facility | Payer: Medicare Other | Source: Skilled Nursing Facility | Attending: Internal Medicine | Admitting: Internal Medicine

## 2022-06-02 ENCOUNTER — Inpatient Hospital Stay (HOSPITAL_COMMUNITY): Payer: Medicare Other

## 2022-06-02 ENCOUNTER — Encounter (HOSPITAL_COMMUNITY): Payer: Self-pay

## 2022-06-02 ENCOUNTER — Other Ambulatory Visit: Payer: Self-pay

## 2022-06-02 DIAGNOSIS — I495 Sick sinus syndrome: Secondary | ICD-10-CM | POA: Diagnosis present

## 2022-06-02 DIAGNOSIS — R4182 Altered mental status, unspecified: Secondary | ICD-10-CM

## 2022-06-02 DIAGNOSIS — Z79899 Other long term (current) drug therapy: Secondary | ICD-10-CM | POA: Diagnosis not present

## 2022-06-02 DIAGNOSIS — G9349 Other encephalopathy: Secondary | ICD-10-CM | POA: Diagnosis present

## 2022-06-02 DIAGNOSIS — Z923 Personal history of irradiation: Secondary | ICD-10-CM | POA: Diagnosis not present

## 2022-06-02 DIAGNOSIS — I455 Other specified heart block: Secondary | ICD-10-CM | POA: Diagnosis present

## 2022-06-02 DIAGNOSIS — G9340 Encephalopathy, unspecified: Secondary | ICD-10-CM | POA: Diagnosis not present

## 2022-06-02 DIAGNOSIS — J690 Pneumonitis due to inhalation of food and vomit: Secondary | ICD-10-CM | POA: Diagnosis present

## 2022-06-02 DIAGNOSIS — I959 Hypotension, unspecified: Secondary | ICD-10-CM | POA: Diagnosis present

## 2022-06-02 DIAGNOSIS — A419 Sepsis, unspecified organism: Secondary | ICD-10-CM | POA: Diagnosis not present

## 2022-06-02 DIAGNOSIS — D696 Thrombocytopenia, unspecified: Secondary | ICD-10-CM | POA: Diagnosis present

## 2022-06-02 DIAGNOSIS — T68XXXD Hypothermia, subsequent encounter: Secondary | ICD-10-CM | POA: Diagnosis not present

## 2022-06-02 DIAGNOSIS — G40209 Localization-related (focal) (partial) symptomatic epilepsy and epileptic syndromes with complex partial seizures, not intractable, without status epilepticus: Secondary | ICD-10-CM | POA: Diagnosis present

## 2022-06-02 DIAGNOSIS — R54 Age-related physical debility: Secondary | ICD-10-CM | POA: Diagnosis present

## 2022-06-02 DIAGNOSIS — T68XXXA Hypothermia, initial encounter: Secondary | ICD-10-CM

## 2022-06-02 DIAGNOSIS — R4189 Other symptoms and signs involving cognitive functions and awareness: Secondary | ICD-10-CM | POA: Diagnosis not present

## 2022-06-02 DIAGNOSIS — J9601 Acute respiratory failure with hypoxia: Secondary | ICD-10-CM | POA: Diagnosis not present

## 2022-06-02 DIAGNOSIS — R569 Unspecified convulsions: Secondary | ICD-10-CM | POA: Diagnosis not present

## 2022-06-02 DIAGNOSIS — H5704 Mydriasis: Secondary | ICD-10-CM | POA: Diagnosis present

## 2022-06-02 DIAGNOSIS — R0989 Other specified symptoms and signs involving the circulatory and respiratory systems: Secondary | ICD-10-CM

## 2022-06-02 DIAGNOSIS — D72819 Decreased white blood cell count, unspecified: Secondary | ICD-10-CM | POA: Diagnosis present

## 2022-06-02 DIAGNOSIS — R001 Bradycardia, unspecified: Secondary | ICD-10-CM

## 2022-06-02 DIAGNOSIS — Z66 Do not resuscitate: Secondary | ICD-10-CM | POA: Diagnosis not present

## 2022-06-02 DIAGNOSIS — Z8546 Personal history of malignant neoplasm of prostate: Secondary | ICD-10-CM

## 2022-06-02 DIAGNOSIS — R5381 Other malaise: Secondary | ICD-10-CM | POA: Diagnosis not present

## 2022-06-02 DIAGNOSIS — I462 Cardiac arrest due to underlying cardiac condition: Secondary | ICD-10-CM | POA: Diagnosis not present

## 2022-06-02 DIAGNOSIS — Z515 Encounter for palliative care: Secondary | ICD-10-CM | POA: Diagnosis not present

## 2022-06-02 DIAGNOSIS — Z7189 Other specified counseling: Secondary | ICD-10-CM | POA: Diagnosis not present

## 2022-06-02 DIAGNOSIS — Z86718 Personal history of other venous thrombosis and embolism: Secondary | ICD-10-CM

## 2022-06-02 DIAGNOSIS — D638 Anemia in other chronic diseases classified elsewhere: Secondary | ICD-10-CM | POA: Diagnosis present

## 2022-06-02 DIAGNOSIS — R401 Stupor: Secondary | ICD-10-CM

## 2022-06-02 DIAGNOSIS — I442 Atrioventricular block, complete: Secondary | ICD-10-CM

## 2022-06-02 DIAGNOSIS — Z1152 Encounter for screening for COVID-19: Secondary | ICD-10-CM

## 2022-06-02 DIAGNOSIS — Z8719 Personal history of other diseases of the digestive system: Secondary | ICD-10-CM

## 2022-06-02 DIAGNOSIS — R680 Hypothermia, not associated with low environmental temperature: Secondary | ICD-10-CM | POA: Diagnosis present

## 2022-06-02 DIAGNOSIS — Z7901 Long term (current) use of anticoagulants: Secondary | ICD-10-CM

## 2022-06-02 DIAGNOSIS — G20C Parkinsonism, unspecified: Secondary | ICD-10-CM | POA: Diagnosis present

## 2022-06-02 DIAGNOSIS — G2581 Restless legs syndrome: Secondary | ICD-10-CM | POA: Diagnosis present

## 2022-06-02 DIAGNOSIS — E876 Hypokalemia: Secondary | ICD-10-CM | POA: Diagnosis present

## 2022-06-02 HISTORY — DX: Hypothermia, initial encounter: T68.XXXA

## 2022-06-02 HISTORY — DX: Bradycardia, unspecified: R00.1

## 2022-06-02 HISTORY — DX: Encephalopathy, unspecified: G93.40

## 2022-06-02 HISTORY — DX: Sepsis, unspecified organism: A41.9

## 2022-06-02 LAB — CBC
HCT: 33.1 % — ABNORMAL LOW (ref 39.0–52.0)
Hemoglobin: 10.9 g/dL — ABNORMAL LOW (ref 13.0–17.0)
MCH: 32.2 pg (ref 26.0–34.0)
MCHC: 32.9 g/dL (ref 30.0–36.0)
MCV: 97.9 fL (ref 80.0–100.0)
Platelets: 130 10*3/uL — ABNORMAL LOW (ref 150–400)
RBC: 3.38 MIL/uL — ABNORMAL LOW (ref 4.22–5.81)
RDW: 12.8 % (ref 11.5–15.5)
WBC: 2.3 10*3/uL — ABNORMAL LOW (ref 4.0–10.5)
nRBC: 0 % (ref 0.0–0.2)

## 2022-06-02 LAB — I-STAT ARTERIAL BLOOD GAS, ED
Acid-base deficit: 3 mmol/L — ABNORMAL HIGH (ref 0.0–2.0)
Bicarbonate: 24.5 mmol/L (ref 20.0–28.0)
Calcium, Ion: 1.32 mmol/L (ref 1.15–1.40)
HCT: 34 % — ABNORMAL LOW (ref 39.0–52.0)
Hemoglobin: 11.6 g/dL — ABNORMAL LOW (ref 13.0–17.0)
O2 Saturation: 89 %
Patient temperature: 88.7
Potassium: 3.5 mmol/L (ref 3.5–5.1)
Sodium: 138 mmol/L (ref 135–145)
TCO2: 26 mmol/L (ref 22–32)
pCO2 arterial: 41.5 mmHg (ref 32–48)
pH, Arterial: 7.351 (ref 7.35–7.45)
pO2, Arterial: 45 mmHg — ABNORMAL LOW (ref 83–108)

## 2022-06-02 LAB — BASIC METABOLIC PANEL
Anion gap: 6 (ref 5–15)
BUN: 16 mg/dL (ref 8–23)
CO2: 26 mmol/L (ref 22–32)
Calcium: 8.2 mg/dL — ABNORMAL LOW (ref 8.9–10.3)
Chloride: 104 mmol/L (ref 98–111)
Creatinine, Ser: 0.64 mg/dL (ref 0.61–1.24)
GFR, Estimated: >60 mL/min (ref >60)
Glucose, Bld: 92 mg/dL (ref 70–99)
Potassium: 4.5 mmol/L (ref 3.5–5.1)
Sodium: 136 mmol/L (ref 135–145)

## 2022-06-02 LAB — PROTIME-INR
INR: 1.1 (ref 0.8–1.2)
Prothrombin Time: 14.3 seconds (ref 11.4–15.2)

## 2022-06-02 LAB — CORTISOL: Cortisol, Plasma: 16.3 ug/dL

## 2022-06-02 LAB — MAGNESIUM
Magnesium: 1.8 mg/dL (ref 1.7–2.4)
Magnesium: 1.6 mg/dL — ABNORMAL LOW (ref 1.7–2.4)

## 2022-06-02 LAB — URINALYSIS, ROUTINE W REFLEX MICROSCOPIC
Bilirubin Urine: NEGATIVE
Glucose, UA: NEGATIVE mg/dL
Hgb urine dipstick: NEGATIVE
Ketones, ur: NEGATIVE mg/dL
Leukocytes,Ua: NEGATIVE
Nitrite: NEGATIVE
Protein, ur: NEGATIVE mg/dL
Specific Gravity, Urine: 1.009 (ref 1.005–1.030)
pH: 7 (ref 5.0–8.0)

## 2022-06-02 LAB — T4, FREE: Free T4: 0.99 ng/dL (ref 0.61–1.12)

## 2022-06-02 LAB — RESP PANEL BY RT-PCR (RSV, FLU A&B, COVID)  RVPGX2
Influenza A by PCR: NEGATIVE
Influenza B by PCR: NEGATIVE
Resp Syncytial Virus by PCR: NEGATIVE
SARS Coronavirus 2 by RT PCR: NEGATIVE

## 2022-06-02 LAB — TSH: TSH: 6.783 u[IU]/mL — ABNORMAL HIGH (ref 0.350–4.500)

## 2022-06-02 LAB — TROPONIN I (HIGH SENSITIVITY)
Troponin I (High Sensitivity): 30 ng/L — ABNORMAL HIGH (ref <18)
Troponin I (High Sensitivity): 35 ng/L — ABNORMAL HIGH (ref <18)

## 2022-06-02 LAB — POTASSIUM: Potassium: 4.4 mmol/L (ref 3.5–5.1)

## 2022-06-02 LAB — VALPROIC ACID LEVEL: Valproic Acid Lvl: 17 ug/mL — ABNORMAL LOW (ref 50.0–100.0)

## 2022-06-02 LAB — AMMONIA: Ammonia: 43 umol/L — ABNORMAL HIGH (ref 9–35)

## 2022-06-02 LAB — MRSA NEXT GEN BY PCR, NASAL: MRSA by PCR Next Gen: NOT DETECTED

## 2022-06-02 LAB — LACTIC ACID, PLASMA
Lactic Acid, Venous: 0.8 mmol/L (ref 0.5–1.9)
Lactic Acid, Venous: 0.6 mmol/L (ref 0.5–1.9)

## 2022-06-02 LAB — PROCALCITONIN: Procalcitonin: <0.1 ng/mL

## 2022-06-02 MED ORDER — ORAL CARE MOUTH RINSE: 15.0000 mL | OROMUCOSAL | Status: DC | PRN

## 2022-06-02 MED ORDER — SODIUM CHLORIDE 0.9 % IV BOLUS
1000.0000 mL | Freq: Once | INTRAVENOUS | Status: AC
  Administered 2022-06-02: 1000 mL via INTRAVENOUS

## 2022-06-02 MED ORDER — CHLORHEXIDINE GLUCONATE CLOTH 2 % EX PADS
6.0000 | MEDICATED_PAD | Freq: Every day | CUTANEOUS | Status: DC
  Administered 2022-06-03 – 2022-06-04 (×2): 6 via TOPICAL

## 2022-06-02 MED ORDER — MAGNESIUM SULFATE 2 GM/50ML IV SOLN
2.0000 g | Freq: Once | INTRAVENOUS | Status: AC
  Administered 2022-06-02: 2 g via INTRAVENOUS
  Filled 2022-06-02: qty 50

## 2022-06-02 MED ORDER — VALPROATE SODIUM 100 MG/ML IV SOLN
500.0000 mg | Freq: Two times a day (BID) | INTRAVENOUS | Status: DC
  Administered 2022-06-03 (×2): 500 mg via INTRAVENOUS
  Filled 2022-06-02 (×4): qty 5

## 2022-06-02 MED ORDER — PIPERACILLIN-TAZOBACTAM 3.375 G IVPB 30 MIN
3.3750 g | Freq: Once | INTRAVENOUS | Status: AC
  Administered 2022-06-02: 3.375 g via INTRAVENOUS
  Filled 2022-06-02: qty 50

## 2022-06-02 MED ORDER — PIPERACILLIN-TAZOBACTAM 3.375 G IVPB
3.3750 g | Freq: Three times a day (TID) | INTRAVENOUS | Status: DC
  Administered 2022-06-02 – 2022-06-04 (×5): 3.375 g via INTRAVENOUS
  Filled 2022-06-02 (×5): qty 50

## 2022-06-02 MED ORDER — MEDIHONEY WOUND/BURN DRESSING EX PSTE
1.0000 | PASTE | Freq: Every day | CUTANEOUS | Status: DC
  Administered 2022-06-03 – 2022-06-09 (×7): 1 via TOPICAL
  Filled 2022-06-02 (×2): qty 44

## 2022-06-02 MED ORDER — VANCOMYCIN HCL 1750 MG/350ML IV SOLN
1750.0000 mg | Freq: Once | INTRAVENOUS | Status: DC
  Filled 2022-06-02: qty 350

## 2022-06-02 MED ORDER — IOHEXOL 350 MG/ML SOLN
100.0000 mL | Freq: Once | INTRAVENOUS | Status: AC | PRN
  Administered 2022-06-02: 100 mL via INTRAVENOUS

## 2022-06-02 MED ORDER — DOCUSATE SODIUM 100 MG PO CAPS: 100.0000 mg | ORAL_CAPSULE | Freq: Two times a day (BID) | ORAL | Status: DC | PRN

## 2022-06-02 MED ORDER — POLYETHYLENE GLYCOL 3350 17 G PO PACK: 17.0000 g | PACK | Freq: Every day | ORAL | Status: DC | PRN

## 2022-06-02 MED ORDER — VANCOMYCIN HCL 1750 MG/350ML IV SOLN
1750.0000 mg | INTRAVENOUS | Status: DC
  Administered 2022-06-02: 1750 mg via INTRAVENOUS
  Filled 2022-06-02 (×2): qty 350

## 2022-06-02 MED ORDER — TOPIRAMATE 25 MG PO TABS
50.0000 mg | ORAL_TABLET | Freq: Every day | ORAL | Status: DC
  Administered 2022-06-02 – 2022-06-08 (×7): 50 mg via ORAL
  Filled 2022-06-02 (×9): qty 2

## 2022-06-02 MED ORDER — LACTATED RINGERS IV BOLUS
1000.0000 mL | Freq: Once | INTRAVENOUS | Status: AC
  Administered 2022-06-02: 1000 mL via INTRAVENOUS

## 2022-06-02 NOTE — ED Notes (Signed)
FREDA (WIFE) [place] BACK # [phone]

## 2022-06-02 NOTE — Progress Notes (Signed)
EEG complete - results pending 

## 2022-06-02 NOTE — Progress Notes (Signed)
2200- RN in patient's room while patient was sipping water from cup. Patient began to weakly cough following sips and then developed bradycardia, lowest rate noted 25 bpm. Zoll pads were already applied to patient by previous shift, and this RN connected patient to Zoll/turned Zoll on with onset of severe bradycardia. Palpable pulse was maintained and continuously assessed during event. Patient remained otherwise asymptomatic. E-Link was immediately requested to camera-in to room and made aware of situation. Heart rate spontaneously increased to 80s. Blood pressure stable throughout event. Bair Hugger remains on patient for slowly improving hypothermia. Per E-link, plan to check magnesium and phosphorous; blood work recently collected by Phlebotomy at the time of this note. Also plan for EKG if rhythm develops back into bradycardia. EKG machine set up and connected to patient at this time. E-Link providers and this RN in agreement to keep patient NPO for the time being (previously following Regular Diet).   2238- Patient heart rate decreased into bradycardia, initially rate of mid-80s decreased to lowest of 44bpm with rapid increase back to 70s. EKG performed by this RN (see ECG chart). New bradycardic event and EKG reported to E-Link.

## 2022-06-02 NOTE — ED Notes (Signed)
DAVID(SON) WANTS TO KNOW UPDATE/STATUS OF FATHER(Jen-PT) CALL BACK# [phone]

## 2022-06-02 NOTE — Plan of Care (Signed)
Called to see this pt for admission bradycardic hypothermic AMS 89yo on eliquis  On my arrival unresponsive, fixed dilated pupils.Paging code stroke.   This is a change from earlier where pt was talking w [place] MSN, AGACNP-BC [place] 06/02/2022, 12:51 PM

## 2022-06-02 NOTE — H&P (Signed)
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 On EMS arrival he was very bradycardic, requiring atropine x3. Noted to be hypothermic in ED with a rectal temp 86, as well as confused, HR 40s. Initial labs with a WBC 2.3.  Started on abx, bair hugger, warmed fluids w concern for sepsis + symptomatic bradycardia r/t hypothermia.  PCCM consulted in this setting    In ED he was unresponsive on my arrival. Pupils are dilated and fixed. ?surgical appearance of L, but no confirmed history of this at time of my exam.  Code stroke initiated    Pertinent  Medical History  Complex partial seizures RLS DVT on eliquis  Significant Hospital Events: Including procedures, antibiotic start and stop dates in addition to other pertinent events   1/20 ED with AMS,?syncope vs near syncope, hypothermia bradycardia. Code stroke   Interim History / Subjective:   Acute mental status change  His HR is low 50s and SBP > 100 at that time.   Objective   Blood pressure (!) 149/88, pulse (!) 52, temperature (!) 86.3 F (30.2 C), temperature source Rectal, resp. rate 20, height '5\' 11"'$  (1.803 m), weight 83.9 kg, SpO2 91 %.        Intake/Output Summary (Last 24 hours) at 06/02/2022 1357 Last data filed at 06/02/2022 1141 Gross per 24 hour  Intake 1000 ml  Output 250 ml  Net 750 ml   Filed Weights   06/02/22 1057  Weight: 83.9 kg    Examination: General: Ill appearing elderly M minimally responsive HENT: NCAT Pink mm  Lungs: even unlabored  Cardiovascular: bradycardic  Abdomen: soft ndnt  Extremities: RLE edema. BLE erythematous  Neuro: Pupils are dilated and  fixed. There is minimal grimace to pain. Does not follow commands. Minimal cough gag GU: defer  Resolved Hospital Problem list     Assessment & Plan:   Acute encephalopathy of unclear etiology Hx complex partial seizures -Stroke vs sz vs metabolic encephalopathy vs hypoperfusion (though at time of this his HR was 50 and SBP > 100-110 so seems less likely).  On eliquis, concern for bleed, bleed could explain some of this hemodynamic presentation as well.  -on VPA and topiramate  P -Code stroke initiated -- Initial CT H without bleed. CTA/perfusion pending. Not a tnk candidate on eliquis  -if unrevealing, MRI when able/stable (likely would need secured airway) -EEG (stat spot eeg. If cEEG, try for MRI compatible if possible) -VPA and topiramate levels  -will check TSH T4 NH4  and CBG.  -BMP is pending   -hold eliquis until MRI -permissive hypertension -- though not hypotensive his mentation seemed to improve as his BP incr from 110 to 150   Concern for sepsis Hypothermia Leukopenia  -UA not concerning for infection  P -broad abx for sepsis -Bair hugger -warm fluids -check a PCT and cortisol  -follow cx data  -add'l IVF bolus   Bradycardia, symptomatic -felt likely 2/2 hypothermia  P -repeat EKG  -rewarming as above  Unprovoked DVT -on eliquis P -holding eliqius until MRI complete   Best Practice (right click and "Reselect all SmartList Selections" daily)  Diet/type: NPO DVT prophylaxis: SCD GI prophylaxis: N/A Lines: N/A Foley:  N/A Code Status:  full code EM MD d/w wife, full code, full scope of care at this time and understands possibility of intubation need but keep updated as clinical status unfolds (ie unless emergent call before intubation if possible) Last date of multidisciplinary goals of care discussion [--]  Labs   CBC: Recent Labs  Lab 06/02/22 1054  WBC 2.3*  HGB 10.9*  HCT 33.1*  MCV 97.9  PLT 130*    Basic Metabolic Panel: No results  for input(s): "NA", "K", "CL", "CO2", "GLUCOSE", "BUN", "CREATININE", "CALCIUM", "MG", "PHOS" in the last 168 hours. GFR: CrCl cannot be calculated (Patient's most recent lab result is older than the maximum 21 days allowed.). Recent Labs  Lab 06/02/22 1054  WBC 2.3*    Liver Function Tests: No results for input(s): "AST", "ALT", "ALKPHOS", "BILITOT", "PROT", "ALBUMIN" in the last 168 hours. No results for input(s): "LIPASE", "AMYLASE" in the last 168 hours. No results for input(s): "AMMONIA" in the last 168 hours.  ABG    Component Value Date/Time   TCO2 25 07/28/2016 1527     Coagulation Profile: No results for input(s): "INR", "PROTIME" in the last 168 hours.  Cardiac Enzymes: No results for input(s): "CKTOTAL", "CKMB", "CKMBINDEX", "TROPONINI" in the last 168 hours.  HbA1C: Hgb A1c MFr Bld  Date/Time Value Ref Range Status  07/13/2016 02:31 AM 5.8 (H) 4.8 - 5.6 % Final    Comment:    (NOTE)         Pre-diabetes: 5.7 - 6.4         Diabetes: >6.4         Glycemic control for adults with diabetes: <7.0     CBG: No results for input(s): "GLUCAP" in the last 168 hours.  Review of Systems:   Unable to obtain due to encephalopathy   Past Medical History:  He,  has a past medical history of Colon polyps, Diverticulosis, Prostate cancer ([place]) (2015), and Seizures ([place]) (07/2016).   Surgical History:   Past Surgical History:  Procedure Laterality Date   colonscopy     INGUINAL HERNIA REPAIR     KNEE SURGERY     PROSTATE BIOPSY       Social History:   reports that he has never smoked. He has never used smokeless tobacco. He reports current alcohol use of about 7.0 standard drinks of alcohol per week. He reports that he does not use drugs.   Family History:  His family history includes Alcoholism in his father. There is no history of Cancer or Colon cancer.   Allergies No Known Allergies   Home Medications  Prior to Admission medications   Medication Sig  Start Date End Date Taking? Authorizing Provider  ALPRAZolam Duanne Moron) 0.5 MG tablet Take 0.5 mg by mouth. 07/20/19   [provider]  ARTIFICIAL TEAR OP Place 1 drop into both eyes daily as needed. For dry eyes    [provider]  cholecalciferol (VITAMIN D) 1000 UNITS tablet Take 2,000 Units by mouth daily.     [provider]  desoximetasone (TOPICORT) 0.25 % cream Apply 1 application topically 2 (two) times daily as needed (rash).  06/17/19   [provider]  divalproex (DEPAKOTE ER) 500 MG 24 hr tablet Take 1 tablet (500 mg total) by mouth daily. 08/22/21   Frann Rider, NP  ELIQUIS 2.5 MG TABS tablet TAKE 1 TABLET BY MOUTH TWICE A DAY 11/28/21  Orson Slick, MD  furosemide (LASIX) 20 MG tablet Take 20 mg by mouth daily.  03/06/18   [provider]  hydrocortisone 2.5 % cream Apply 1 application. topically daily as needed for other. Skin irritation 06/23/19   [provider]  mometasone (ELOCON) 0.1 % ointment Apply 1 application topically 2 (two) times daily as needed (rash).  06/23/19   [provider]  Multiple Vitamin (MULTIVITAMIN) tablet Take 1 tablet by mouth daily.    [provider]  omeprazole (PRILOSEC) 20 MG capsule Take 20 mg by mouth daily as needed (acid reflux).  12/29/18   [provider]  tamsulosin (FLOMAX) 0.4 MG CAPS capsule TAKE 1 CAPSULE (0.4 MG TOTAL) BY MOUTH DAILY AFTER SUPPER. 03/21/16   Tyler Pita, MD  topiramate (TOPAMAX) 50 MG tablet Take 1 tablet (50 mg total) by mouth at bedtime. 08/22/21   Frann Rider, NP  traZODone (DESYREL) 50 MG tablet Take 25 mg by mouth at bedtime.  06/18/16   [provider]  triamcinolone cream (KENALOG) 0.1 % Apply 1 application topically 2 (two) times daily as needed (rash).  06/23/19   [provider]     Critical care time: 60 min    CRITICAL CARE Performed by: Cristal Generous   Total critical care time: 60 minutes  Critical care  time was exclusive of separately billable procedures and treating other patients.  Critical care was necessary to treat or prevent imminent or life-threatening deterioration.  Critical care was time spent personally by me on the following activities: development of treatment plan with patient and/or surrogate as well as nursing, discussions with consultants, evaluation of patient's response to treatment, examination of patient, obtaining history from patient or surrogate, ordering and performing treatments and interventions, ordering and review of laboratory studies, ordering and review of radiographic studies, pulse oximetry and re-evaluation of patient's condition.   [name] MSN, AGACNP-BC [place] for pager  06/02/2022, 1:57 PM

## 2022-06-02 NOTE — ED Triage Notes (Signed)
EMS called initially for a fall, upon arrival HR at 46, in route 3, 1 mg of Atropine, 900 cc of NS brought HR up to 40 BPM each dose.   No betablockers on med list, patient denies dizziness and weakness, or chest pain

## 2022-06-02 NOTE — Progress Notes (Signed)
Pharmacy Antibiotic Note  Kenneth Bowen is a 89 y.o. male for which pharmacy has been consulted for vancomycin and zosyn dosing for sepsis.  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 Patient presenting with AMS.  SCr 0.64 WBC 2.3; LA 0.6; T 88.7; HR 45; RR 17 COVID neg / flu neg  Plan: Zosyn 3.375g IV q8h (4 hour infusion) Vancomycin 1750 mg q24hr (eAUC 484.9) unless change in renal function Trend WBC, Fever, Renal function F/u cultures, clinical course, WBC De-escalate when able Levels at steady state  Height: '5\' 11"'$  (180.3 cm) Weight: 83.9 kg (185 lb) IBW/kg (Calculated) : 75.3  Temp (24hrs), Avg:86.3 F (30.2 C), Min:86.3 F (30.2 C), Max:86.3 F (30.2 C)  Recent Labs  Lab 06/02/22 1054  WBC 2.3*    CrCl cannot be calculated (Patient's most recent lab result is older than the maximum 21 days allowed.).    No Known Allergies  Antimicrobials this admission: vancomycin 1/20 >>  zosyn 1/20 >>   Microbiology results: Pending  Thank you for allowing pharmacy to be a part of this patient's care.  Lorelei Pont, PharmD, BCPS 06/02/2022 12:30 PM ED Clinical Pharmacist -  [phone]

## 2022-06-02 NOTE — Procedures (Addendum)
Patient Name: Kenneth Bowen  MRN: [id number]  Epilepsy Attending: Lora Havens  Referring Physician/Provider: Cristal Generous, NP  Date: 04/03/2023 Duration: 22.42 mins  Patient history: 89 yo M PMH complex partial seizures who presented to ED 1/20 from assisted living after possible syncope or near syncope. EEG to evaluate for seizure.  Level of alertness: Awake  AEDs during EEG study: None  Technical aspects: This EEG study was done with scalp electrodes positioned according to the 10-20 International system of electrode placement. Electrical activity was reviewed with band pass filter of 1-'70Hz'$ , sensitivity of 7 uV/mm, display speed of 18m/sec with a '60Hz'$  notched filter applied as appropriate. EEG data were recorded continuously and digitally stored.  Video monitoring was available and reviewed as appropriate.  Description: The posterior dominant rhythm consists of 9 Hz activity of moderate voltage (25-35 uV) seen predominantly in posterior head regions, symmetric and reactive to eye opening and eye closing.  There is an excessive amount of 15 to 18 Hz beta activity distributed symmetrically and diffusely.  Hyperventilation and photic stimulation were not performed.     ABNORMALITY - Excessive beta, generalized  IMPRESSION: This study is within normal limits. The excessive beta activity seen in the background is most likely due to the effect of benzodiazepine and is a benign EEG pattern. No seizures or epileptiform discharges were seen throughout the recording.  A normal interictal EEG does not exclude the diagnosis of epilepsy.  Margurete Guaman OBarbra Sarks

## 2022-06-02 NOTE — ED Notes (Signed)
Bair Hugger applied, warm fluids infusing.

## 2022-06-02 NOTE — Consult Note (Signed)
Neurology Consultation  Reason for Consult: Code Stroke Referring Physician: Dr. Tacy Learn  CC: Unresponsive, dilated, nonreactive pupils  History is obtained from: Chart review, Dr. Tacy Learn and [name], NP, patient is unable to provide history due to altered mental status  HPI: Kenneth Bowen is a 89 y.o. 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  On arrival patient was found to be hypothermic with a rectal temperature of 86 F and bradycardic with heart rate to the 40's s/p multiple doses of atropine.  Initially patient was reported to be answering some orientation questions but appeared to be pale and generally weak with minimal extremity movements throughout.  Patient's condition declined and patient became unresponsive and a code medical was paged.  When PCCM evaluated patient, they noted the pupils were 7 mm and nonreactive and patient did not have a cough/gag and was no longer interactive with staff/answering orientation questions and a code stroke was paged for further neurology evaluation. Patient's exam markedly improved over the course of the stroke code and he was able to answer questions and move all extremities.   After speaking with the patient's wife, patient's wife notes that [name] has been acting strange off and on for about 1 month but has become more noticeable this week.  She states that she would ask a question and he would not respond but she felt like he might not have been able to hear her.  She also notes that he possibly has hallucinations as he has been talking to people who are not in the room but states that he does not see people who are not there.  At baseline [name] has had balance issues for the last 1 to 2  years and uses a rolling walker for ambulation.  He manages his own medication and is still able to drive short distances.  She states that last night 06/01/2022, the patient was complaining of ongoing skin complaints and that he put lotion on for 3 hours until 01:00 this morning. He got into bed at that time and was talking with her about the day in his usual state of health.  She states that she woke up at 8:00 this morning and the patient was not awake which is abnormal as he usually wakes up at 07:00.  She went to check on him and noticed that he was laying in the floor.  Patient states that he was attempting to slippers on and he slipped onto the floor.  Patient required assistance to get off of the floor but patient's wife and caregiver noticed that he was not acting right, was not talking, or answering questions.  Patient was subsequently transferred to the hospital.  LKW: 01:00 AM this morning  TNK given?: no, patient is on full dose anticoagulation, unknown last dose IR Thrombectomy? No, no LVO on vessel imaging  Modified Rankin Scale: 3-Moderate disability-requires help but walks WITHOUT assistance  ROS: Unable to obtain due to altered mental status.   Past Medical History:  Diagnosis Date   Colon polyps    Diverticulosis    Prostate cancer [facility]) 2015   radiation finished Nov 2015   Seizures ([place]) 07/2016   Past Surgical History:  Procedure Laterality Date   colonscopy     INGUINAL HERNIA  REPAIR     KNEE SURGERY     PROSTATE BIOPSY     Family History  Problem Relation Age of Onset   Alcoholism Father    Cancer Neg Hx    Colon cancer Neg Hx    Social History:   reports that he has never smoked. He has never used smokeless tobacco. He reports current alcohol use of about 7.0 standard drinks of alcohol per week. He reports that he does not use drugs.  Medications  Current Facility-Administered Medications:    docusate sodium (COLACE) capsule 100 mg, 100 mg, Oral, BID PRN,  Bowser, Laurel Dimmer, NP   lactated ringers bolus 1,000 mL, 1,000 mL, Intravenous, Once, Bowser, Shirlee Limerick E, NP   piperacillin-tazobactam (ZOSYN) IVPB 3.375 g, 3.375 g, Intravenous, Once **FOLLOWED BY** piperacillin-tazobactam (ZOSYN) IVPB 3.375 g, 3.375 g, Intravenous, Q8H, Trifan, Carola Rhine, MD   polyethylene glycol (MIRALAX / GLYCOLAX) packet 17 g, 17 g, Oral, Daily PRN, Bowser, Laurel Dimmer, NP   vancomycin (VANCOREADY) IVPB 1750 mg/350 mL, 1,750 mg, Intravenous, Once, Trifan, Carola Rhine, MD  Current Outpatient Medications:    ALPRAZolam (XANAX) 0.5 MG tablet, Take 0.5 mg by mouth., Disp: , Rfl:    ARTIFICIAL TEAR OP, Place 1 drop into both eyes daily as needed. For dry eyes, Disp: , Rfl:    cholecalciferol (VITAMIN D) 1000 UNITS tablet, Take 2,000 Units by mouth daily. , Disp: , Rfl:    desoximetasone (TOPICORT) 0.25 % cream, Apply 1 application topically 2 (two) times daily as needed (rash). , Disp: , Rfl:    divalproex (DEPAKOTE ER) 500 MG 24 hr tablet, Take 1 tablet (500 mg total) by mouth daily., Disp: 90 tablet, Rfl: 3   ELIQUIS 2.5 MG TABS tablet, TAKE 1 TABLET BY MOUTH TWICE A DAY, Disp: 180 tablet, Rfl: 2   furosemide (LASIX) 20 MG tablet, Take 20 mg by mouth daily. , Disp: , Rfl:    hydrocortisone 2.5 % cream, Apply 1 application. topically daily as needed for other. Skin irritation, Disp: , Rfl:    mometasone (ELOCON) 0.1 % ointment, Apply 1 application topically 2 (two) times daily as needed (rash). , Disp: , Rfl:    Multiple Vitamin (MULTIVITAMIN) tablet, Take 1 tablet by mouth daily., Disp: , Rfl:    omeprazole (PRILOSEC) 20 MG capsule, Take 20 mg by mouth daily as needed (acid reflux). , Disp: , Rfl:    tamsulosin (FLOMAX) 0.4 MG CAPS capsule, TAKE 1 CAPSULE (0.4 MG TOTAL) BY MOUTH DAILY AFTER SUPPER., Disp: 30 capsule, Rfl: 5   topiramate (TOPAMAX) 50 MG tablet, Take 1 tablet (50 mg total) by mouth at bedtime., Disp: 90 tablet, Rfl: 3   traZODone (DESYREL) 50 MG tablet, Take 25 mg by mouth  at bedtime. , Disp: , Rfl:    triamcinolone cream (KENALOG) 0.1 %, Apply 1 application topically 2 (two) times daily as needed (rash). , Disp: , Rfl:   Exam: Current vital signs: BP (!) 149/88   Pulse (!) 52   Temp (!) 86.3 F (30.2 C) (Rectal) Comment: Trifan, MD notified  Resp 20   Ht '5\' 11"'$  (1.803 m)   Wt 83.9 kg   SpO2 91%   BMI 25.80 kg/m  Vital signs in last 24 hours: Temp:  [86.3 F (30.2 C)] 86.3 F (30.2 C) (01/20 1103) Pulse Rate:  [42-57] 52 (01/20 1330) Resp:  [11-20] 20 (01/20 1330) BP: (100-149)/(54-88) 149/88 (01/20 1330) SpO2:  [91 %-100 %] 91 % (01/20 1330) Weight:  [83.9  kg] 83.9 kg (01/20 1057)  GENERAL: Drowsy, wakes to loud voice, appears critically ill. Psych: Patient is cooperative with examination. Head: Normocephalic and atraumatic, without obvious abnormality EENT: Normal conjunctivae, dry mucous membranes, no OP obstruction LUNGS: Normal respiratory effort.  Nasal cannula in place. CV: Sinus bradycardia on cardiac monitor ABDOMEN: Soft, non-tender, non-distended Extremities: Cool to touch, no obvious abnormality  NEURO:  Mental Status: Drowsy, wakes to loud voice. Patient is able to state his name, correct age, and the correct month through significantly dysarthric speech.  Patient's voice is barely audible and examiner had to read patient's lips for comprehension. Patient is able to name 3/6 objects and does not attempt to name the rest secondary to drowsiness. He follows simple commands or minimally. Initially, patient does not follow exams though after initial CT head imaging, patient follows all commands with repeat coaching. Cranial Nerves:  II: Pupils are 7 mm equal, round, nonreactive to light bilaterally III, IV, VI: Appears to have a right-sided gaze preference, will cross midline towards the left but will not look fully left. VFF V: Sensation is intact to light touch and symmetrical to face.  VII: Face is symmetric resting and smiling.   VIII: Hearing intact to loud voice IX, X: Palate elevation is symmetric.  Hypophonic XI: Head is grossly midline XII: Tongue protrudes midline without fasciculations.   Motor: Initially, patient does not move extremities.  After initial CT imaging, patient elevates bilateral upper extremities without vertical drift.  He does are to be generally weak. Bilateral lower extremities wobble with elevation left worse than right.  There is minimal left lower extremity drift on examination. Tone is normal. Bulk is normal.  Sensation: Intact to light touch bilaterally in all four extremities.  Coordination: No ataxia noted. Gait: Deferred for patient safety  NIHSS: 1a Level of Conscious.: 1 1b LOC Questions: 0 1c LOC Commands: 0 2 Best Gaze: 1 3 Visual: 0 4 Facial Palsy: 0 5a Motor Arm - left: 0 5b Motor Arm - Right: 0 6a Motor Leg - Left: 1 6b Motor Leg - Right: 0 7 Limb Ataxia: 0 8 Sensory: 0 9 Best Language: 1 10 Dysarthria: 2 11 Extinct. and Inatten.: 0 TOTAL: 6  Labs I have reviewed labs in epic and the results pertinent to this consultation are: CBC    Component Value Date/Time   WBC 2.3 (L) 06/02/2022 1054   RBC 3.38 (L) 06/02/2022 1054   HGB 10.9 (L) 06/02/2022 1054   HGB 11.2 (L) 02/14/2022 1416   HGB 12.2 (L) 02/10/2018 1352   HCT 33.1 (L) 06/02/2022 1054   HCT 37.1 (L) 02/10/2018 1352   PLT 130 (L) 06/02/2022 1054   PLT 293 02/14/2022 1416   PLT 267 02/10/2018 1352   MCV 97.9 06/02/2022 1054   MCV 102 (H) 02/10/2018 1352   MCH 32.2 06/02/2022 1054   MCHC 32.9 06/02/2022 1054   RDW 12.8 06/02/2022 1054   RDW 13.1 02/10/2018 1352   LYMPHSABS 1.5 02/14/2022 1416   LYMPHSABS 1.3 02/10/2018 1352   MONOABS 0.8 02/14/2022 1416   EOSABS 0.1 02/14/2022 1416   EOSABS 0.1 02/10/2018 1352   BASOSABS 0.1 02/14/2022 1416   BASOSABS 0.0 02/10/2018 1352   CMP     Component Value Date/Time   NA 133 (L) 02/14/2022 1416   NA 138 02/10/2018 1352   K 4.2 02/14/2022 1416    CL 102 02/14/2022 1416   CO2 27 02/14/2022 1416   GLUCOSE 91 02/14/2022 1416   BUN  29 (H) 02/14/2022 1416   BUN 15 02/10/2018 1352   CREATININE 1.08 02/14/2022 1416   CALCIUM 8.4 (L) 02/14/2022 1416   PROT 6.2 (L) 02/14/2022 1416   PROT 6.4 02/10/2018 1352   ALBUMIN 3.3 (L) 02/14/2022 1416   ALBUMIN 4.0 02/10/2018 1352   AST 19 02/14/2022 1416   ALT 17 02/14/2022 1416   ALKPHOS 47 02/14/2022 1416   BILITOT 0.6 02/14/2022 1416   GFRNONAA >60 02/14/2022 1416   GFRAA >60 07/21/2019 0950   Lipid Panel     Component Value Date/Time   CHOL 114 07/13/2016 0231   TRIG 85 07/13/2016 0231   HDL 42 07/13/2016 0231   CHOLHDL 2.7 07/13/2016 0231   VLDL 17 07/13/2016 0231   LDLCALC 55 07/13/2016 0231   Lab Results  Component Value Date   HGBA1C 5.8 (H) 07/13/2016   Urinalysis    Component Value Date/Time   COLORURINE STRAW (A) 06/02/2022 1131   APPEARANCEUR CLEAR 06/02/2022 1131   LABSPEC 1.009 06/02/2022 1131   PHURINE 7.0 06/02/2022 [place] 06/02/2022 [place] 06/02/2022 [place] 06/02/2022 [place] 06/02/2022 1131   PROTEINUR NEGATIVE 06/02/2022 1131   NITRITE NEGATIVE 06/02/2022 [place] 06/02/2022 1131   Lab Results  Component Value Date   TSH 6.783 (H) 06/02/2022    Ammonia pending VPA level pending  Topiramate level pending Lactate pending Blood cultures pending Respiratory culture negative  Imaging I have reviewed the images obtained:  CT-scan of the brain 06/02/22: 1. No evidence of acute intracranial abnormality. 2. ASPECTS is 10.  CT angio head and neck + CT perfusion 06/02/22: 1. No large vessel occlusion or proximal hemodynamically significant stenosis. 2. Small (5 mL) area of penumbra reported in the left posterior/inferior temporal region, possibly artifactual given streak artifact in this region.  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  Initial workup revealed patient with bradycardia, hypotension, and hypothermia with a rectal temperature of 86 F.  Patient's condition progressed to unresponsiveness with physical exam findings concerning for dilated unresponsive pupils and negative for cough and gag reflex and code stroke was activated.  CT imaging is negative for acute intracranial abnormality and vessel imaging is negative for LVO or hemodynamically significant stenosis.  Examination fluctuates with patient responsiveness, command following, but patient's pupils have remained dilated and nonresponsive throughout.  Patient's presentation of altered mental status may be related to sepsis.  Patient was found on the floor by his wife, unable to rule out unwitnessed seizure activity.  Will obtain MRI brain for further evaluation of patient presentation.  Patient is not a candidate for TNK as his last known well was > 4.5 hours prior to arrival and he is on home Eliquis for unprovoked DVT.  Recommendations: - MRI brain without contrast - Hold Eliquis at this time until MRI brain results - Neurology will f/u on MRI imaging - Agree with serum VPA/topiramate levels - Routine EEG - Avoid hypotension - Infectious/septic work up per PCCM  Pt seen by NP/Neuro and later by MD. August Luz to be edited by MD as needed.  Anibal Henderson, AGAC-NP [facility] Pager: [phone]  Neurology Attending Attestation   I examined the patient and discussed plan with Ms. Toberman NP.  Above note has been edited by me to reflect my findings and recommendations. I was present throughout the stroke code and made all significant decisions and personally reviewed CNS imaging and also discussed CTA results with radiologist by phone.    This patient is  critically ill and at significant risk of neurological worsening, death and care requires constant monitoring of vital signs, hemodynamics,respiratory and cardiac monitoring, neurological assessment, discussion with family, other specialists and medical decision making of high complexity. I spent 60 minutes of neurocritical care time  in the care of  this patient. This was time spent independent of any time provided by nurse practitioner or PA.   Su Monks, MD [facility] [phone]   If 7pm- 7am, please page neurology on call as listed in [place].

## 2022-06-02 NOTE — Code Documentation (Signed)
Stroke Response Nurse Documentation Code Documentation  Kenneth Bowen is a 89 y.o. male arriving to [facility]  via [facility] on 06/02/22 with past medical hx of seizures, prostate CA. On Eliquis (apixaban) daily. Code stroke was activated by ED.   Patient from ALF with wife, to ED for syncope/near syncopal episode this AM. Upon arrival to ED, pt found to be hypothermic 86 degrees. Bradycardic, maintaining HR in 50s after atropine with EMS. Bair hugger and warmed fluids in ED.  Patient A&O in ED, declined to unresponsive, Code Medical paged--PCCM to bedside and noted pupils 7, nonreactive with negative cough/gag. Code stroke paged.   Patient to CT with team upon activation. CT head negative for bleed. Patient improvement with HOB flat. NIHSS 6, see documentation for details and code stroke times. Patient with decreased LOC, right gaze preference , left leg weakness, Expressive aphasia , and dysarthria  on exam.   The following imaging was completed:  CT Head, CTA, and CTP. Patient is not a candidate for IV Thrombolytic due to Eliquis. Patient is not a candidate for IR due to no LVO.   Care Plan: q2h Vitals and NIHSS, permissive hypertension.   Bedside handoff with ED RN Leafy Ro.    Candace Cruise K  Stroke Response RN

## 2022-06-02 NOTE — Consult Note (Signed)
[facility] Nurse Consult Note: Reason for Consult:Non pressure related wounds noted on admission. Wound type:full thickness wounds to bilateral posterior elbows, right lateral LE and left lateral foot Pressure Injury POA: N/A Measurement:per Nursing Flow Sheet Left posterior elbow: 1cm round with depth obscured by the presence of yellow/brown nonviable tissue, intact periwound and scant serous drainage. Right posterior elbow: 1cm rouind x 0.2cm with pink moist wound bed and scant serosanguinous exudate Right lateral LE full thickness wound measuring 8cm x 4cm x 0.5cm with red moist wound bed and scant serous exudate, edematous LE Left lateral foot full thickness wound (Photo uploaded to EMR on Admission by EDP): 4cm x 1cm x 0.5cm with 80% red and 20% yellow wound bed, no exudate Wound bed: As noted above Drainage (amount, consistency, odor) As noted above Periwound:As noted above Dressing procedure/placement/frequency: Patient is on a mattress replacement with low air loss feature as he is in the ICU and is being turned and repositioned per house protocol. I will provide bilateral pressure redistribution heel boots (Prevalon) and guidance for topical wound care using leptospermum [place] (MediHoney) as an antimicrobial wound gel for the promotion of wound healing and in the instance of the left foot wound and left elbow, autolytic debridement. This will be applied daily after cleansing.  [place] nursing team will not follow, but will remain available to this patient, the nursing and medical teams.  Please re-consult if needed.  Thank you for inviting [place] to participate in this patient's Plan of Care.  Maudie Flakes, MSN, RN, CNS, [place], Serita Grammes, [employer], [employer] phone:  [phone]

## 2022-06-02 NOTE — Progress Notes (Addendum)
[facility] Progress Note Patient Name: Kenneth Bowen DOB: [birth year] MRN: [id number]   Date of Service  06/02/2022  HPI/Events of Note  Red alert: As per RN, went into a transient bradycardia without dropping BP while having he is taking fluids, cough.  Now VS stable.  Syncope-bradyrhythmia- hypothermia from admit, resolved.  Last Potassium at 3.5 from ABG (OK- pH normal), TSH > 6. EKG from admit: Mobitz type 1 with qtc 503.    ECHO from 07/2021: EF 55%.   eICU Interventions  - warm blankets  Stat EKG, K/mag and ion calcium level.      Intervention Category Major Interventions: Arrhythmia - evaluation and management  Elmer Sow 06/02/2022, 10:26 PM  23:44  Mag at 1.6, K level is ok. Mag 2 gm IV stat ordered, follow Mag level in AM.  Discussed with RN. Transiently HR went down again earlier after my visit. EKG done stat,  Sinus rhythm, 2nd degree AV block. Now type 2, 2:1 block. Has zol pack pads on.  On warm blanket and bear hugger, temp up to 35 degree.  120/66, current HR 74.  CCM is in loop about above status.   3;38 Camera: HR dropped transiently to 20's, right back up. Monitor showing Mobitz type 2 . As per RN, Cardiologist is coming to see other patient, now in ED. Would call him to see him also here for consultation. VS stable. Right fore arm redness, post depakene is stable. Pharmacy is helping out for same.  [place], Dr Vickki Muff, francis called back. He will going to see him. Consultation request ordered.

## 2022-06-02 NOTE — ED Provider Notes (Signed)
[facility] Provider Note   CSN: [id number] Arrival date & time: 06/02/22  1045  An emergency department physician performed an initial assessment on this suspected stroke patient at 1245.  History  Chief Complaint  Patient presents with   Bradycardia    Kenneth Bowen is a 89 y.o. male with a history of DVT on Eliquis, presented emergency department from assisted living facility with concern for weakness, near syncope, altered mental status.  The patient is not having any complaints but appears extremely fatigued and is a poor historian, not able to give me much information on arrival.  EMS reports that the patient was initially hypotensive on their arrival, also had episodes of bradycardia with heart rate dropping as low as 30, for which EMS gave 3 rounds of atropine en route to the hospital.  Update: his wife [name] by phone tells me the patient has been "weak" for about 7 days.  He was complaining of dizziness when standing several times.  He was confused.  Today the patient sat down on the bed to put his shoes on, and he fell over onto the floor, but there was no LOC.  [name] reports that the patient does have an advance directive but she does not know where it is, they have never discussed end-of-life care, she is not certain what his wishes are.  HPI     Home Medications Prior to Admission medications   Medication Sig Start Date End Date Taking? Authorizing Provider  divalproex (DEPAKOTE ER) 500 MG 24 hr tablet Take 1 tablet (500 mg total) by mouth daily. 08/22/21  Yes McCue, Janett Billow, NP  ELIQUIS 2.5 MG TABS tablet TAKE 1 TABLET BY MOUTH TWICE A DAY 11/28/21  Yes Orson Slick, MD  Ferrous Sulfate (IRON PO) Take 1 tablet by mouth daily.   Yes [provider]  furosemide (LASIX) 40 MG tablet Take 40 mg by mouth daily.   Yes [provider]  ALPRAZolam Duanne Moron) 0.5 MG tablet Take 0.5 mg by mouth. 07/20/19   [provider]  ARTIFICIAL TEAR OP Place 1 drop into both eyes daily as needed. For dry eyes    [provider]  cholecalciferol (VITAMIN D) 1000 UNITS tablet Take 2,000 Units by mouth daily.     [provider]  desoximetasone (TOPICORT) 0.25 % cream Apply 1 application topically 2 (two) times daily as needed (rash).  06/17/19   [provider]  hydrocortisone 2.5 % cream Apply 1 application. topically daily as needed for other. Skin irritation 06/23/19   [provider]  mometasone (ELOCON) 0.1 % ointment Apply 1 application topically 2 (two) times daily as needed (rash).  06/23/19   [provider]  Multiple Vitamin (MULTIVITAMIN) tablet Take 1 tablet by mouth daily.    [provider]  omeprazole (PRILOSEC) 20 MG capsule Take 20 mg by mouth daily as needed (acid reflux).  12/29/18   [provider]  tamsulosin (FLOMAX) 0.4 MG CAPS capsule TAKE 1 CAPSULE (0.4 MG TOTAL) BY MOUTH DAILY AFTER SUPPER. 03/21/16   Tyler Pita, MD  topiramate (TOPAMAX) 50 MG tablet Take 1 tablet (50 mg total) by mouth at bedtime. 08/22/21   Frann Rider, NP  traZODone (DESYREL) 50 MG tablet Take 25 mg by mouth at bedtime.  06/18/16   [provider]      Allergies    Patient has no known allergies.    Review of Systems  Review of Systems  Physical Exam Updated Vital Signs BP 127/73   Pulse 68   Temp (!) 89.6 F (32 C)   Resp 19   Ht '5\' 11"'$  (1.803 m)   Wt 83.2 kg   SpO2 92%   BMI 25.58 kg/m  Physical Exam Constitutional:      General: He is not in acute distress. HENT:     Head: Normocephalic and atraumatic.  Eyes:     Conjunctiva/sclera: Conjunctivae normal.     Pupils: Pupils are equal, round, and reactive to light.  Cardiovascular:     Rate and Rhythm: Regular rhythm. Bradycardia present.  Pulmonary:     Effort: Pulmonary effort is normal. No respiratory distress.  Abdominal:     General: There is no distension.      Tenderness: There is no abdominal tenderness.  Skin:    General: Skin is warm and dry.  Neurological:     General: No focal deficit present.     Mental Status: He is alert. Mental status is at baseline.     ED Results / Procedures / Treatments   Labs (all labs ordered are listed, but only abnormal results are displayed) Labs Reviewed  CBC - Abnormal; Notable for the following components:      Result Value   WBC 2.3 (*)    RBC 3.38 (*)    Hemoglobin 10.9 (*)    HCT 33.1 (*)    Platelets 130 (*)    All other components within normal limits  URINALYSIS, ROUTINE W REFLEX MICROSCOPIC - Abnormal; Notable for the following components:   Color, Urine STRAW (*)    All other components within normal limits  TSH - Abnormal; Notable for the following components:   TSH 6.783 (*)    All other components within normal limits  BASIC METABOLIC PANEL - Abnormal; Notable for the following components:   Calcium 8.2 (*)    All other components within normal limits  I-STAT ARTERIAL BLOOD GAS, ED - Abnormal; Notable for the following components:   pO2, Arterial 45 (*)    Acid-base deficit 3.0 (*)    HCT 34.0 (*)    Hemoglobin 11.6 (*)    All other components within normal limits  TROPONIN I (HIGH SENSITIVITY) - Abnormal; Notable for the following components:   Troponin I (High Sensitivity) 30 (*)    All other components within normal limits  TROPONIN I (HIGH SENSITIVITY) - Abnormal; Notable for the following components:   Troponin I (High Sensitivity) 35 (*)    All other components within normal limits  RESP PANEL BY RT-PCR (RSV, FLU A&B, COVID)  RVPGX2  CULTURE, BLOOD (ROUTINE X 2)  CULTURE, BLOOD (ROUTINE X 2)  MRSA NEXT GEN BY PCR, NASAL  LACTIC ACID, PLASMA  LACTIC ACID, PLASMA  MAGNESIUM  CORTISOL  TOPIRAMATE LEVEL  VALPROIC ACID LEVEL  PROCALCITONIN  PROTIME-INR  BLOOD GAS, ARTERIAL  T4, FREE  AMMONIA    EKG EKG Interpretation  Date/Time:  Saturday June 02 2022  10:54:07 EST Ventricular Rate:  44 PR Interval:  238 QRS Duration: 119 QT Interval:  587 QTC Calculation: 503 R Axis:   58 Text Interpretation: Bradycardia with irregular rate Prolonged PR interval  - likely 2nd degree heart block type 1 Confirmed by Octaviano Glow [phone]) on 06/02/2022 12:14:20 PM  Radiology CT ANGIO HEAD NECK W WO CM W PERF (CODE STROKE)  Result Date: 06/02/2022 CLINICAL DATA:  Neuro deficit, acute, stroke suspected pupils fixed, dilated, R gaze preference,  no movement to noxious stimuli bilat EXAM: CT ANGIOGRAPHY HEAD AND NECK CT PERFUSION BRAIN TECHNIQUE: Multidetector CT imaging of the head and neck was performed using the standard protocol during bolus administration of intravenous contrast. Multiplanar CT image reconstructions and MIPs were obtained to evaluate the vascular anatomy. Carotid stenosis measurements (when applicable) are obtained utilizing NASCET criteria, using the distal internal carotid diameter as the denominator. Multiphase CT imaging of the brain was performed following IV bolus contrast injection. Subsequent parametric perfusion maps were calculated using RAPID software. RADIATION DOSE REDUCTION: This exam was performed according to the departmental dose-optimization program which includes automated exposure control, adjustment of the mA and/or kV according to patient size and/or use of iterative reconstruction technique. CONTRAST:  163m OMNIPAQUE IOHEXOL 350 MG/ML SOLN COMPARISON:  Same day CT head.  CTA head/neck July 13, 2016. FINDINGS: CTA NECK FINDINGS Aortic arch: Great vessel origins are patent. Right carotid system: Atherosclerosis at the carotid bifurcation without greater than 50% stenosis. Left carotid system: Atherosclerosis at the carotid bifurcation without greater than 50% stenosis Vertebral arteries: Left dominant. Both vertebral arteries are patent without greater than 50% stenosis Skeleton: Severe multilevel degenerative change. Other neck:  No acute abnormality on limited assessment. Upper chest: No consolidation the visualized lung apices. Review of the MIP images confirms the above findings CTA HEAD FINDINGS Anterior circulation: Bilateral intracranial ICAs are patent with mild for age narrowing due to atherosclerosis. Bilateral MCAs and ACAs are patent without proximal hemodynamically significant stenosis. Posterior circulation: Bilateral intradural vertebral arteries, basilar artery and bilateral posterior cerebral arteries are patent without proximal hemodynamically significant stenosis. Venous sinuses: As permitted by contrast timing, patent. Small right transverse/sigmoid sinus, similar to the prior. Review of the MIP images confirms the above findings CT Brain Perfusion Findings: ASPECTS: 10 CBF (<30%) Volume: 09mPerfusion (Tmax>6.0s) volume: 18m318mismatch Volume: 18mL52mfarction Location:None identified. IMPRESSION: 1. No large vessel occlusion or proximal hemodynamically significant stenosis. 2. Small (5 mL) area of penumbra reported in the left posterior/inferior temporal region, possibly artifactual given streak artifact in this region. Findings discussed with Dr. StacQuinn Axe telephone at 1:24 p.m. Electronically Signed   By: FredMargaretha Sheffield.   On: 06/02/2022 13:32   CT HEAD CODE STROKE WO CONTRAST  Result Date: 06/02/2022 CLINICAL DATA:  Code stroke.  Neuro deficit, acute, stroke suspected EXAM: CT HEAD WITHOUT CONTRAST TECHNIQUE: Contiguous axial images were obtained from the base of the skull through the vertex without intravenous contrast. RADIATION DOSE REDUCTION: This exam was performed according to the departmental dose-optimization program which includes automated exposure control, adjustment of the mA and/or kV according to patient size and/or use of iterative reconstruction technique. COMPARISON:  CT head July 28, 2016. FINDINGS: Brain: No evidence of acute infarction, hemorrhage, hydrocephalus, extra-axial collection or  mass lesion/mass effect. Similar patchy white matter hypodensities, compatible with chronic microvascular ischemic disease. Vascular: Calcific atherosclerosis.  No hyperdense vessel. Skull: No acute fracture. Sinuses/Orbits: Clear sinuses.  No acute orbital findings. Other: No mastoid effusions. ASPECTS ([facility] Early CT Score) Total score (0-10 with 10 being normal): 10. IMPRESSION: 1. No evidence of acute intracranial abnormality. 2. ASPECTS is 10. Code stroke imaging results were communicated on 06/02/2022 at 1:10 pm to provider STACK via secure text paging. Electronically Signed   By: FredMargaretha Sheffield.   On: 06/02/2022 13:11   DG Chest Portable 1 View  Result Date: 06/02/2022 CLINICAL DATA:  Altered mental status.  Aspiration.  Infection. EXAM: PORTABLE CHEST 1 VIEW COMPARISON:  07/21/2019 FINDINGS: Extensive artifact overlies the chest. Heart size is normal. Mediastinal shadows appear normal. Poor inspiratory effort. Minimal patchy density at the lung bases could relate to the poor inspiration or could indicate mild basilar pneumonia. No lobar consolidation or collapse. No pulmonary edema or pleural effusion. IMPRESSION: Poor inspiratory effort. Minimal patchy density at the lung bases could relate to the poor inspiration or could indicate mild basilar pneumonia. Electronically Signed   By: Nelson Chimes M.D.   On: 06/02/2022 11:50    Procedures .Critical Care  Performed by: Wyvonnia Dusky, MD Authorized by: Wyvonnia Dusky, MD   Critical care provider statement:    Critical care time (minutes):  45   Critical care time was exclusive of:  Separately billable procedures and treating other patients   Critical care was necessary to treat or prevent imminent or life-threatening deterioration of the following conditions:  Circulatory failure and sepsis   Critical care was time spent personally by me on the following activities:  Ordering and performing treatments and  interventions, ordering and review of laboratory studies, ordering and review of radiographic studies, pulse oximetry, review of old charts, examination of patient and evaluation of patient's response to treatment Comments:     Bradycardia monitoring, sepsis management     Medications Ordered in ED Medications  piperacillin-tazobactam (ZOSYN) IVPB 3.375 g (3.375 g Intravenous New Bag/Given 06/02/22 1531)    Followed by  piperacillin-tazobactam (ZOSYN) IVPB 3.375 g (has no administration in time range)  docusate sodium (COLACE) capsule 100 mg (has no administration in time range)  polyethylene glycol (MIRALAX / GLYCOLAX) packet 17 g (has no administration in time range)  vancomycin (VANCOREADY) IVPB 1750 mg/350 mL (1,750 mg Intravenous New Bag/Given 06/02/22 1535)  Chlorhexidine Gluconate Cloth 2 % PADS 6 each (has no administration in time range)  sodium chloride 0.9 % bolus 1,000 mL (0 mLs Intravenous Stopped 06/02/22 1145)  iohexol (OMNIPAQUE) 350 MG/ML injection 100 mL (100 mLs Intravenous Contrast Given 06/02/22 1320)  lactated ringers bolus 1,000 mL (1,000 mLs Intravenous New Bag/Given 06/02/22 1517)    ED Course/ Medical Decision Making/ A&P Clinical Course as of 06/02/22 1537  Sat Jun 02, 2022  1102 Rectal temp 86.20F - will order external warming [MT]  1106 ECG per my interpretation shows 2nd degree heartblock, wencheback pattern, type 1, likely 2/2 hypothermia [MT]  1222 Icu consulted [MT]  1304 I reevaluated the patient and he was unresponsive in the room, pupils are now dilated, GCS 3.  In this setting the patient was activated as a code medical for stat CT, concerning for hemorrhage.  ICU team is present at the bedside and he is at CT now [MT]  1319 No ICH on CT scan. [MT]    Clinical Course User Index [MT] Joyanne Eddinger, Carola Rhine, MD                             Medical Decision Making Amount and/or Complexity of Data Reviewed Labs: ordered. Radiology:  ordered.  Risk Prescription drug management. Decision regarding hospitalization.   This patient presents to the ED with concern for altered mental status, fatigue, syncope or near syncope. This involves an extensive number of treatment options, and is a complaint that carries with it a high risk of complications and morbidity.  The differential diagnosis includes arrhythmia versus anemia or electrolyte derangement versus other  Patient is profoundly hypothermic on arrival with a rectal temp 86.50F.  This  may be due to thyroid dysfunction versus infection.  There is no report of significant environmental cold exposure.  Patient is being rewarmed at this time with warmed IV fluids and Bair hugger.  This hypothermia may be contributing to his bradycardia and lethargy.  Additional history obtained from EMS on the patient's arrival, patient's wife by phone  External records from outside source obtained and reviewed including most recent EKG in March 2021 with HR 63  I ordered and personally interpreted labs.  The pertinent results include: Chronic leukopenia white blood cell count 2.3.  UA without sign of infection.  I ordered imaging studies including x-ray of the chest   I independently visualized and interpreted imaging which showed poor respiratory effort but concern for potential atelectasis versus pneumonia I agree with the radiologist interpretation  The patient was maintained on a cardiac monitor.  I personally viewed and interpreted the cardiac monitored which showed an underlying rhythm of: Sinus bradycardia, HR 40's  Per my interpretation the patient's ECG shows Sinus bradycardia with concern for wenchebach 2nd degree heart block  I ordered medication including broad-spectrum antibiotics for potential sepsis.  Fluid bolus.  I have reviewed the patients home medicines and have made adjustments as needed  Test Considered: Low suspicion for stroke clinically.  I requested  consultation with the critical care,  and discussed lab and imaging findings as well as pertinent plan - they recommend: Critical care admission  After the interventions noted above, I reevaluated the patient and found that they have: worsened   Dispostion:  After consideration of the diagnostic results and the patients response to treatment, I feel that the patent would benefit from ICU admission.         Final Clinical Impression(s) / ED Diagnoses Final diagnoses:  Bradycardia  Hypothermia, initial encounter  Sepsis, due to unspecified organism, unspecified whether acute organ dysfunction present ([place])  Altered mental status, unspecified altered mental status type    Rx / DC Orders ED Discharge Orders     None         Wyvonnia Dusky, MD 06/02/22 1537

## 2022-06-03 ENCOUNTER — Encounter (HOSPITAL_COMMUNITY)
Admission: EM | Disposition: A | Discharge status: Skilled Nursing Facility | Payer: Self-pay | Source: Skilled Nursing Facility | Attending: Internal Medicine

## 2022-06-03 DIAGNOSIS — R4182 Altered mental status, unspecified: Secondary | ICD-10-CM

## 2022-06-03 DIAGNOSIS — I442 Atrioventricular block, complete: Secondary | ICD-10-CM

## 2022-06-03 DIAGNOSIS — T68XXXA Hypothermia, initial encounter: Secondary | ICD-10-CM

## 2022-06-03 DIAGNOSIS — R4189 Other symptoms and signs involving cognitive functions and awareness: Secondary | ICD-10-CM | POA: Diagnosis not present

## 2022-06-03 DIAGNOSIS — G9340 Encephalopathy, unspecified: Secondary | ICD-10-CM | POA: Diagnosis not present

## 2022-06-03 DIAGNOSIS — A419 Sepsis, unspecified organism: Secondary | ICD-10-CM | POA: Diagnosis not present

## 2022-06-03 DIAGNOSIS — T68XXXD Hypothermia, subsequent encounter: Secondary | ICD-10-CM | POA: Diagnosis not present

## 2022-06-03 DIAGNOSIS — R001 Bradycardia, unspecified: Secondary | ICD-10-CM | POA: Diagnosis not present

## 2022-06-03 HISTORY — PX: TEMPORARY PACEMAKER: CATH118268

## 2022-06-03 LAB — BASIC METABOLIC PANEL
Anion gap: 7 (ref 5–15)
BUN: 13 mg/dL (ref 8–23)
CO2: 24 mmol/L (ref 22–32)
Calcium: 8.7 mg/dL — ABNORMAL LOW (ref 8.9–10.3)
Chloride: 104 mmol/L (ref 98–111)
Creatinine, Ser: 0.79 mg/dL (ref 0.61–1.24)
GFR, Estimated: >60 mL/min (ref >60)
Glucose, Bld: 102 mg/dL — ABNORMAL HIGH (ref 70–99)
Potassium: 4.3 mmol/L (ref 3.5–5.1)
Sodium: 135 mmol/L (ref 135–145)

## 2022-06-03 LAB — CBC WITH DIFFERENTIAL/PLATELET
Abs Immature Granulocytes: 0.05 10*3/uL (ref 0.00–0.07)
Basophils Absolute: 0 10*3/uL (ref 0.0–0.1)
Basophils Relative: 0 %
Eosinophils Absolute: 0 10*3/uL (ref 0.0–0.5)
Eosinophils Relative: 0 %
HCT: 33.6 % — ABNORMAL LOW (ref 39.0–52.0)
Hemoglobin: 11.6 g/dL — ABNORMAL LOW (ref 13.0–17.0)
Immature Granulocytes: 1 %
Lymphocytes Relative: 7 %
Lymphs Abs: 0.4 10*3/uL — ABNORMAL LOW (ref 0.7–4.0)
MCH: 31.9 pg (ref 26.0–34.0)
MCHC: 34.5 g/dL (ref 30.0–36.0)
MCV: 92.3 fL (ref 80.0–100.0)
Monocytes Absolute: 0.5 10*3/uL (ref 0.1–1.0)
Monocytes Relative: 7 %
Neutro Abs: 5.3 10*3/uL (ref 1.7–7.7)
Neutrophils Relative %: 85 %
Platelets: 156 10*3/uL (ref 150–400)
RBC: 3.64 MIL/uL — ABNORMAL LOW (ref 4.22–5.81)
RDW: 12.6 % (ref 11.5–15.5)
WBC: 6.3 10*3/uL (ref 4.0–10.5)
nRBC: 0 % (ref 0.0–0.2)

## 2022-06-03 LAB — COMPREHENSIVE METABOLIC PANEL
ALT: 26 U/L (ref 0–44)
AST: 31 U/L (ref 15–41)
Albumin: 2.9 g/dL — ABNORMAL LOW (ref 3.5–5.0)
Alkaline Phosphatase: 60 U/L (ref 38–126)
Anion gap: 8 (ref 5–15)
BUN: 13 mg/dL (ref 8–23)
CO2: 24 mmol/L (ref 22–32)
Calcium: 8.8 mg/dL — ABNORMAL LOW (ref 8.9–10.3)
Chloride: 104 mmol/L (ref 98–111)
Creatinine, Ser: 0.85 mg/dL (ref 0.61–1.24)
GFR, Estimated: >60 mL/min (ref >60)
Glucose, Bld: 103 mg/dL — ABNORMAL HIGH (ref 70–99)
Potassium: 3.9 mmol/L (ref 3.5–5.1)
Sodium: 136 mmol/L (ref 135–145)
Total Bilirubin: 0.8 mg/dL (ref 0.3–1.2)
Total Protein: 5.7 g/dL — ABNORMAL LOW (ref 6.5–8.1)

## 2022-06-03 LAB — AMMONIA: Ammonia: 42 umol/L — ABNORMAL HIGH (ref 9–35)

## 2022-06-03 LAB — CREATININE, SERUM
Creatinine, Ser: 1.07 mg/dL (ref 0.61–1.24)
GFR, Estimated: >60 mL/min (ref >60)

## 2022-06-03 LAB — CBC
HCT: 33.2 % — ABNORMAL LOW (ref 39.0–52.0)
HCT: 35.1 % — ABNORMAL LOW (ref 39.0–52.0)
Hemoglobin: 11.1 g/dL — ABNORMAL LOW (ref 13.0–17.0)
Hemoglobin: 11.7 g/dL — ABNORMAL LOW (ref 13.0–17.0)
MCH: 31.4 pg (ref 26.0–34.0)
MCH: 32.4 pg (ref 26.0–34.0)
MCHC: 33.4 g/dL (ref 30.0–36.0)
MCHC: 33.3 g/dL (ref 30.0–36.0)
MCV: 94.1 fL (ref 80.0–100.0)
MCV: 97.2 fL (ref 80.0–100.0)
Platelets: 163 10*3/uL (ref 150–400)
Platelets: 149 10*3/uL — ABNORMAL LOW (ref 150–400)
RBC: 3.53 MIL/uL — ABNORMAL LOW (ref 4.22–5.81)
RBC: 3.61 MIL/uL — ABNORMAL LOW (ref 4.22–5.81)
RDW: 12.7 % (ref 11.5–15.5)
RDW: 13.1 % (ref 11.5–15.5)
WBC: 6.4 10*3/uL (ref 4.0–10.5)
WBC: 8.3 10*3/uL (ref 4.0–10.5)
nRBC: 0 % (ref 0.0–0.2)
nRBC: 0 % (ref 0.0–0.2)

## 2022-06-03 LAB — GLUCOSE, CAPILLARY: Glucose-Capillary: 128 mg/dL — ABNORMAL HIGH (ref 70–99)

## 2022-06-03 LAB — VALPROIC ACID LEVEL: Valproic Acid Lvl: 29 ug/mL — ABNORMAL LOW (ref 50.0–100.0)

## 2022-06-03 SURGERY — TEMPORARY PACEMAKER
Anesthesia: LOCAL

## 2022-06-03 MED ORDER — LIDOCAINE HCL (PF) 1 % IJ SOLN
INTRAMUSCULAR | Status: AC
  Filled 2022-06-03: qty 30

## 2022-06-03 MED ORDER — ENOXAPARIN SODIUM 40 MG/0.4ML IJ SOSY
40.0000 mg | PREFILLED_SYRINGE | INTRAMUSCULAR | Status: DC
  Administered 2022-06-04: 40 mg via SUBCUTANEOUS
  Filled 2022-06-03: qty 0.4

## 2022-06-03 MED ORDER — LIDOCAINE HCL (PF) 1 % IJ SOLN
INTRAMUSCULAR | Status: DC | PRN
  Administered 2022-06-03: 5 mL

## 2022-06-03 MED ORDER — HEPARIN (PORCINE) IN NACL 1000-0.9 UT/500ML-% IV SOLN
INTRAVENOUS | Status: DC | PRN
  Administered 2022-06-03: 500 mL

## 2022-06-03 MED ORDER — ONDANSETRON HCL 4 MG/2ML IJ SOLN: 4.0000 mg | Freq: Four times a day (QID) | INTRAMUSCULAR | Status: DC | PRN

## 2022-06-03 MED ORDER — LABETALOL HCL 5 MG/ML IV SOLN: 10.0000 mg | INTRAVENOUS | Status: AC | PRN

## 2022-06-03 MED ORDER — ACETAMINOPHEN 325 MG PO TABS
650.0000 mg | ORAL_TABLET | ORAL | Status: DC | PRN
  Administered 2022-06-03: 650 mg via ORAL
  Filled 2022-06-03: qty 2

## 2022-06-03 MED ORDER — SODIUM CHLORIDE 0.9 % IV SOLN: 250.0000 mL | INTRAVENOUS | Status: DC | PRN

## 2022-06-03 MED ORDER — HYDRALAZINE HCL 20 MG/ML IJ SOLN: 10.0000 mg | INTRAMUSCULAR | Status: AC | PRN

## 2022-06-03 MED ORDER — SODIUM CHLORIDE 0.9% FLUSH
3.0000 mL | INTRAVENOUS | Status: DC | PRN
  Administered 2022-06-07: 3 mL via INTRAVENOUS

## 2022-06-03 MED ORDER — HEPARIN (PORCINE) IN NACL 1000-0.9 UT/500ML-% IV SOLN
INTRAVENOUS | Status: AC
  Filled 2022-06-03: qty 500

## 2022-06-03 MED ORDER — DOPAMINE-DEXTROSE 3.2-5 MG/ML-% IV SOLN
5.0000 ug/kg/min | INTRAVENOUS | Status: DC
  Administered 2022-06-03: 5 ug/kg/min via INTRAVENOUS
  Filled 2022-06-03: qty 250

## 2022-06-03 MED ORDER — SODIUM CHLORIDE 0.9% FLUSH
3.0000 mL | Freq: Two times a day (BID) | INTRAVENOUS | Status: DC
  Administered 2022-06-04 – 2022-06-09 (×10): 3 mL via INTRAVENOUS

## 2022-06-03 MED ORDER — DIVALPROEX SODIUM ER 500 MG PO TB24
500.0000 mg | ORAL_TABLET | Freq: Every day | ORAL | Status: DC
  Administered 2022-06-03 – 2022-06-06 (×4): 500 mg via ORAL
  Filled 2022-06-03 (×6): qty 1

## 2022-06-03 SURGICAL SUPPLY — 6 items
CABLE ADAPT PACING TEMP 12FT (ADAPTER) IMPLANT
CATH S G BIP PACING (CATHETERS) IMPLANT
GLIDESHEATH SLEND SS 6F .021 (SHEATH) IMPLANT
KIT ENCORE 26 ADVANTAGE (KITS) IMPLANT
SHEATH PINNACLE 6F 10CM (SHEATH) IMPLANT
SHEATH PROBE COVER 6X72 (BAG) IMPLANT

## 2022-06-03 NOTE — Progress Notes (Signed)
Orthopedic Tech Progress Note Patient Details:  Kenneth Bowen Oct 03, 1930 [id number]  Ortho Devices Type of Ortho Device: Arm sling Ortho Device/Splint Location: rue Ortho Device/Splint Interventions: Ordered, Application, Adjustment  I applied the arm sling with the RN assist. Post Interventions Patient Tolerated: Well Instructions Provided: Care of device, Adjustment of device  Karolee Stamps 06/03/2022, 8:12 PM

## 2022-06-03 NOTE — Consult Note (Signed)
Cardiology Consult Note   Patient ID: Kenneth Bowen MRN: [id number]; DOB: Oct 11, 1930   Admission date: 06/02/2022  PCP:  Sueanne Margarita, [place] Providers Cardiologist:  None        Chief Complaint: Bradycardia   History of Present Illness:   [name] is a 89 y.o. male with a history of complex partial seizures, mild parkinsonism, prostate cancer s/p radiation who presented to the ED from an assisted living facility with a syncopal event and was found to be hypothermic with altered mental status. Cardiology is now consulted for bradycardia with concern for high degree AV block.   Patient was brought in by EMS and was extremely bradycardic requiring atropine injection x 3. In the ED, he was found to be hypothermic with rectal temp of 84F. EKG on arrival showed marked sinus bradycardia with a markedly prolonged first degree AV delay. Subsequent EKGs showed what appears to be Wenckebach phenomenon with extremely subtle PR prolongation followed by a nonconducted P wave, and still other EKGs and telemetry strips showed instances of two sequential nonconducted P waves. There have been several telemetry alarms for HR dropping into the 30s with recovery to more normal rates with 1:1 AV conduction with AV delay.    Past Medical History:  Diagnosis Date   Colon polyps    Diverticulosis    Prostate cancer [facility]) 2015   radiation finished Nov 2015   Seizures ([place]) 07/2016    Past Surgical History:  Procedure Laterality Date   colonscopy     INGUINAL HERNIA REPAIR     KNEE SURGERY     PROSTATE BIOPSY       Medications Prior to Admission: Prior to Admission medications   Medication Sig Start Date End Date Taking? Authorizing Provider  divalproex (DEPAKOTE ER) 500 MG 24 hr tablet Take 1 tablet (500 mg total) by mouth daily. 08/22/21  Yes McCue, Janett Billow, NP  ELIQUIS 2.5 MG TABS tablet TAKE 1 TABLET BY MOUTH TWICE A DAY 11/28/21  Yes Orson Slick, MD   Ferrous Sulfate (IRON PO) Take 1 tablet by mouth daily.   Yes [provider]  furosemide (LASIX) 40 MG tablet Take 40 mg by mouth daily.   Yes [provider]  ALPRAZolam Duanne Moron) 0.5 MG tablet Take 0.5 mg by mouth. 07/20/19   [provider]  ARTIFICIAL TEAR OP Place 1 drop into both eyes daily as needed. For dry eyes    [provider]  cholecalciferol (VITAMIN D) 1000 UNITS tablet Take 2,000 Units by mouth daily.     [provider]  desoximetasone (TOPICORT) 0.25 % cream Apply 1 application topically 2 (two) times daily as needed (rash).  06/17/19   [provider]  hydrocortisone 2.5 % cream Apply 1 application. topically daily as needed for other. Skin irritation 06/23/19   [provider]  mometasone (ELOCON) 0.1 % ointment Apply 1 application topically 2 (two) times daily as needed (rash).  06/23/19   [provider]  Multiple Vitamin (MULTIVITAMIN) tablet Take 1 tablet by mouth daily.    [provider]  omeprazole (PRILOSEC) 20 MG capsule Take 20 mg by mouth daily as needed (acid reflux).  12/29/18   [provider]  tamsulosin (FLOMAX) 0.4 MG CAPS capsule TAKE 1 CAPSULE (0.4 MG TOTAL) BY MOUTH DAILY AFTER SUPPER. 03/21/16   Tyler Pita, MD  topiramate (TOPAMAX) 50 MG tablet Take 1 tablet (50 mg total) by mouth at  bedtime. 08/22/21   Frann Rider, NP  traZODone (DESYREL) 50 MG tablet Take 25 mg by mouth at bedtime.  06/18/16   [provider]     Allergies:   No Known Allergies  Social History:   Social History   Socioeconomic History   Marital status: Married    Spouse name: Not on file   Number of children: Not on file   Years of education: Not on file   Highest education level: Not on file  Occupational History   Occupation: civil Chief Financial Officer  Tobacco Use   Smoking status: Never   Smokeless tobacco: Never  Substance and Sexual Activity   Alcohol use: Yes    Alcohol/week: 7.0  standard drinks of alcohol    Types: 7 Glasses of wine per week    Comment: 4 oz nightly   Drug use: No   Sexual activity: Yes  Other Topics Concern   Not on file  Social History Narrative   Lives with wife at [facility] in [place].  Uses cane.     Social Determinants of Health   Financial Resource Strain: Not on file  Food Insecurity: No Food Insecurity (06/02/2022)   Hunger Vital Sign    Worried About Running Out of Food in the Last Year: Never true    Ran Out of Food in the Last Year: Never true  Transportation Needs: No Transportation Needs (06/02/2022)   PRAPARE - Hydrologist (Medical): No    Lack of Transportation (Non-Medical): No  Physical Activity: Not on file  Stress: Not on file  Social Connections: Not on file  Intimate Partner Violence: Not At Risk (06/02/2022)   Humiliation, Afraid, Rape, and Kick questionnaire    Fear of Current or Ex-Partner: No    Emotionally Abused: No    Physically Abused: No    Sexually Abused: No    Family History:   The patient's family history includes Alcoholism in his father. There is no history of Cancer or Colon cancer.    ROS:  Please see the history of present illness.  All other ROS reviewed and negative.     Physical Exam/Data:   Vitals:   06/03/22 0200 06/03/22 0230 06/03/22 0300 06/03/22 0330  BP: 95/60 (!) 109/52 (!) 97/59 103/82  Pulse: 69 67 64 70  Resp: (!) 23 (!) 25 (!) 22 (!) 21  Temp: 98.1 F (36.7 C) 97.9 F (36.6 C) 97.7 F (36.5 C) (!) 97.3 F (36.3 C)  TempSrc:      SpO2: 94% 94% 95% 95%  Weight:      Height:        Intake/Output Summary (Last 24 hours) at 06/03/2022 0525 Last data filed at 06/03/2022 0000 Gross per 24 hour  Intake 2700.09 ml  Output 1751 ml  Net 949.09 ml      06/02/2022    3:00 PM 06/02/2022   10:57 AM 02/14/2022    2:45 PM  Last 3 Weights  Weight (lbs) 183 lb 6.8 oz 185 lb 185 lb 4.8 oz  Weight (kg) 83.2 kg 83.915 kg 84.052 kg      Body mass index is 25.58 kg/m.  General:  Well nourished, well developed, in no acute distress HEENT: normal Neck: no JVD Cardiac:  normal S1, S2; RRR; no m/r/g appreciated Lungs:  clear to auscultation anteriorly, no wheezing, rhonchi or rales  Abd: soft, nontender/nondistended, no hepatomegaly  Ext: trace pedal edema Skin: warm and dry  Neuro:  Grossly intact Psych:  Flat affect   Relevant CV Studies: TTE - 08/04/21   1. Left ventricular ejection fraction, by estimation, is 60 to 65%. The  left ventricle has normal function. The left ventricle has no regional  wall motion abnormalities. Left ventricular diastolic parameters are  indeterminate.   2. Right ventricular systolic function is normal. The right ventricular  size is mildly enlarged. Tricuspid regurgitation signal is inadequate for  assessing PA pressure.   3. The mitral valve is grossly normal. Trivial mitral valve  regurgitation.   4. The aortic valve is grossly normal. There is moderate calcification of  the aortic valve. Aortic valve regurgitation is not visualized.   5. The inferior vena cava is normal in size with greater than 50%  respiratory variability, suggesting right atrial pressure of 3 mmHg.   Laboratory Data:  High Sensitivity Troponin:   Recent Labs  Lab 06/02/22 1054 06/02/22 1250  TROPONINIHS 30* 35*      Chemistry Recent Labs  Lab 06/02/22 1250 06/02/22 1418 06/02/22 2234 06/03/22 0154  NA 136 138  --  135  K 4.5 3.5 4.4 4.3  CL 104  --   --  104  CO2 26  --   --  24  GLUCOSE 92  --   --  102*  BUN 16  --   --  13  CREATININE 0.64  --   --  0.79  CALCIUM 8.2*  --   --  8.7*  MG 1.8  --  1.6*  --   GFRNONAA >60  --   --  >60  ANIONGAP 6  --   --  7    No results for input(s): "PROT", "ALBUMIN", "AST", "ALT", "ALKPHOS", "BILITOT" in the last 168 hours. Lipids No results for input(s): "CHOL", "TRIG", "HDL", "LABVLDL", "LDLCALC", "CHOLHDL" in the last 168 hours. Hematology Recent  Labs  Lab 06/03/22 0154 06/03/22 0422  WBC 6.4 6.3  RBC 3.53* 3.64*  HGB 11.1* 11.6*  HCT 33.2* 33.6*  MCV 94.1 92.3  MCH 31.4 31.9  MCHC 33.4 34.5  RDW 12.7 12.6  PLT 163 156   Thyroid  Recent Labs  Lab 06/02/22 1054 06/02/22 1650  TSH 6.783*  --   FREET4  --  0.99   BNPNo results for input(s): "BNP", "PROBNP" in the last 168 hours.  DDimer No results for input(s): "DDIMER" in the last 168 hours.   Radiology/Studies:  EEG adult  Result Date: 06/02/2022 Lora Havens, MD     06/02/2022  3:44 PM Patient Name: Kenneth Bowen MRN: [id number] Epilepsy Attending: Lora Havens Referring Physician/Provider: Cristal Generous, NP Date: 04/03/2023 Duration: Patient history: 89 yo M PMH complex partial seizures who presented to ED 1/20 from assisted living after possible syncope or near syncope. EEG to evaluate for seizure. Level of alertness: Awake AEDs during EEG study: None Technical aspects: This EEG study was done with scalp electrodes positioned according to the 10-20 International system of electrode placement. Electrical activity was reviewed with band pass filter of 1-'70Hz'$ , sensitivity of 7 uV/mm, display speed of 73m/sec with a '60Hz'$  notched filter applied as appropriate. EEG data were recorded continuously and digitally stored.  Video monitoring was available and reviewed as appropriate. Description: The posterior dominant rhythm consists of 9 Hz activity of moderate voltage (25-35 uV) seen predominantly in posterior head regions, symmetric and reactive to eye opening and eye closing.  There is an excessive amount of 15 to 18 Hz beta activity  distributed symmetrically and diffusely.  Hyperventilation and photic stimulation were not performed.   ABNORMALITY - Excessive beta, generalized IMPRESSION: This study is within normal limits. The excessive beta activity seen in the background is most likely due to the effect of benzodiazepine and is a benign EEG pattern. No seizures or  epileptiform discharges were seen throughout the recording. A normal interictal EEG does not exclude the diagnosis of epilepsy. Priyanka Barbra Sarks   CT ANGIO HEAD NECK W WO CM W PERF (CODE STROKE)  Result Date: 06/02/2022 CLINICAL DATA:  Neuro deficit, acute, stroke suspected pupils fixed, dilated, R gaze preference, no movement to noxious stimuli bilat EXAM: CT ANGIOGRAPHY HEAD AND NECK CT PERFUSION BRAIN TECHNIQUE: Multidetector CT imaging of the head and neck was performed using the standard protocol during bolus administration of intravenous contrast. Multiplanar CT image reconstructions and MIPs were obtained to evaluate the vascular anatomy. Carotid stenosis measurements (when applicable) are obtained utilizing NASCET criteria, using the distal internal carotid diameter as the denominator. Multiphase CT imaging of the brain was performed following IV bolus contrast injection. Subsequent parametric perfusion maps were calculated using RAPID software. RADIATION DOSE REDUCTION: This exam was performed according to the departmental dose-optimization program which includes automated exposure control, adjustment of the mA and/or kV according to patient size and/or use of iterative reconstruction technique. CONTRAST:  19m OMNIPAQUE IOHEXOL 350 MG/ML SOLN COMPARISON:  Same day CT head.  CTA head/neck July 13, 2016. FINDINGS: CTA NECK FINDINGS Aortic arch: Great vessel origins are patent. Right carotid system: Atherosclerosis at the carotid bifurcation without greater than 50% stenosis. Left carotid system: Atherosclerosis at the carotid bifurcation without greater than 50% stenosis Vertebral arteries: Left dominant. Both vertebral arteries are patent without greater than 50% stenosis Skeleton: Severe multilevel degenerative change. Other neck: No acute abnormality on limited assessment. Upper chest: No consolidation the visualized lung apices. Review of the MIP images confirms the above findings CTA HEAD FINDINGS  Anterior circulation: Bilateral intracranial ICAs are patent with mild for age narrowing due to atherosclerosis. Bilateral MCAs and ACAs are patent without proximal hemodynamically significant stenosis. Posterior circulation: Bilateral intradural vertebral arteries, basilar artery and bilateral posterior cerebral arteries are patent without proximal hemodynamically significant stenosis. Venous sinuses: As permitted by contrast timing, patent. Small right transverse/sigmoid sinus, similar to the prior. Review of the MIP images confirms the above findings CT Brain Perfusion Findings: ASPECTS: 10 CBF (<30%) Volume: 068mPerfusion (Tmax>6.0s) volume: 80m74mismatch Volume: 80mL6mfarction Location:None identified. IMPRESSION: 1. No large vessel occlusion or proximal hemodynamically significant stenosis. 2. Small (5 mL) area of penumbra reported in the left posterior/inferior temporal region, possibly artifactual given streak artifact in this region. Findings discussed with Dr. StacQuinn Axe telephone at 1:24 p.m. Electronically Signed   By: FredMargaretha Sheffield.   On: 06/02/2022 13:32   CT HEAD CODE STROKE WO CONTRAST  Result Date: 06/02/2022 CLINICAL DATA:  Code stroke.  Neuro deficit, acute, stroke suspected EXAM: CT HEAD WITHOUT CONTRAST TECHNIQUE: Contiguous axial images were obtained from the base of the skull through the vertex without intravenous contrast. RADIATION DOSE REDUCTION: This exam was performed according to the departmental dose-optimization program which includes automated exposure control, adjustment of the mA and/or kV according to patient size and/or use of iterative reconstruction technique. COMPARISON:  CT head July 28, 2016. FINDINGS: Brain: No evidence of acute infarction, hemorrhage, hydrocephalus, extra-axial collection or mass lesion/mass effect. Similar patchy white matter hypodensities, compatible with chronic microvascular ischemic disease. Vascular: Calcific atherosclerosis.  No  hyperdense  vessel. Skull: No acute fracture. Sinuses/Orbits: Clear sinuses.  No acute orbital findings. Other: No mastoid effusions. ASPECTS [facility] Stroke Program Early CT Score) Total score (0-10 with 10 being normal): 10. IMPRESSION: 1. No evidence of acute intracranial abnormality. 2. ASPECTS is 10. Code stroke imaging results were communicated on 06/02/2022 at 1:10 pm to provider STACK via secure text paging. Electronically Signed   By: Margaretha Sheffield M.D.   On: 06/02/2022 13:11   DG Chest Portable 1 View  Result Date: 06/02/2022 CLINICAL DATA:  Altered mental status.  Aspiration.  Infection. EXAM: PORTABLE CHEST 1 VIEW COMPARISON:  07/21/2019 FINDINGS: Extensive artifact overlies the chest. Heart size is normal. Mediastinal shadows appear normal. Poor inspiratory effort. Minimal patchy density at the lung bases could relate to the poor inspiration or could indicate mild basilar pneumonia. No lobar consolidation or collapse. No pulmonary edema or pleural effusion. IMPRESSION: Poor inspiratory effort. Minimal patchy density at the lung bases could relate to the poor inspiration or could indicate mild basilar pneumonia. Electronically Signed   By: Nelson Chimes M.D.   On: 06/02/2022 11:50     Assessment and Plan:   [name] is a 89 y.o. male with a history of complex partial seizures, mild parkinsonism, prostate cancer s/p radiation who presented to the ED from an assisted living facility with a syncopal event and was found to be hypothermic with altered mental status. Cardiology is now consulted for bradycardia with concern for high degree AV block.   # High Degree AV block # Hypothermia # Parkinsonism :: Unclear etiology, but strong suspicion that patient's hypothermia is exacerbating AV nodal conduction in a patient with a known prolonged PR interval at baseline.Frequent drops to HR in the 30s with subsequent recovery.  - Continue warming measures and evaluation/treatment of infection per  guidance of the primary team - Continue monitoring on telemetry - Start dopamine at 26mg.kg/min for chronotropic support - Discuss case with EP in the AM; may require temporary pacing while the underlying culprit is further investigated   For questions or updates, please contact CEwingPlease consult [email or web address] for contact info under     Signed, FClois Dupes MD  06/03/2022 5:25 AM

## 2022-06-03 NOTE — Progress Notes (Signed)
   06/03/22 1800  Spiritual Encounters  Type of Visit Initial  Care provided to: Patient  Referral source Nurse (RN/NT/LPN)  Reason for visit Urgent spiritual support  OnCall Visit Yes  Spiritual Framework  Community/Connection Family  Interventions  Spiritual Care Interventions Made Reflective listening  Spiritual Care Plan  Spiritual Care Issues Still Outstanding No further spiritual care needs at this time (see row info)   [place] responded to code STEMI. Provided emotional support and active listening to pt. No follow up needed at this time.

## 2022-06-03 NOTE — Progress Notes (Addendum)
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  Initial findings concerning for hypothermia with a rectal temperature of 86 F and bradycardia s/p multiple doses of atropine.  During PCCM evaluation, patient was noted to have dilated and nonresponsive pupils, was unresponsive, and did not have a cough or gag reflex on assessment and a code stroke was activated at that time.  Initial CT imaging was negative for acute intracranial abnormality and patient's mental status had improved on neurology assessment.  Subjective: Patient is sitting up, feeding himself breakfast on evaluation this morning. He reports that he is at his baseline status though he feels he cannot get up and start walking since he has been in bed for prolonged period of time. Patient did have some choking on breakfast this morning, but states that he does this from time to time.  Advised bedside RN that if the patient has further choking episodes, to notify MD and make patient NPO for formal swallow evaluation.   Exam: Vitals:   06/03/22 0800 06/03/22 0900  BP: (!) 121/55 114/61  Pulse: (!) 59 (!) 52  Resp: 16 17  Temp: (!) 97.3 F (36.3 C) (!) 97.5 F (36.4 C)  SpO2: 95% 94%   Gen: Sitting up in bed, feeding himself breakfast Resp: non-labored breathing, on nasal cannula, SpO2 94% Abd: soft, nt, nondistended  Neuro: Mental Status: Awake, alert, oriented to self, place, situation, and time.  Initially patient states that he is 89 years old but quickly corrects himself to his correct age of 89 years old. Patient states that he fell slightly sideways/backwards while standing in front of the sink yesterday morning and fell over the commode. (States he has a tendency to fall backward with his  parkinsonism).  He was unable to get up on his own and there was a pool of blood due to his Eliquis which prompted activation of emergency services. Patient follows commands without difficulty. There is no aphasia or dysarthria noted. Cranial Nerves: PERRL, EOMI, VFF, face is symmetric resting and with movement, facial sensation intact and symmetric to light touch, hearing is intact to voice, tongue is midline.  Motor: Bilateral upper extremities elevate antigravity without vertical drift.  5 out of 5 strength throughout bilateral upper extremities. Bilateral lower extremities with some weakness.  He is able to minimally elevate bilateral lower extremities antigravity. Sensory: Intact and symmetric to light touch throughout Gait: Deferred for patient's safety  Pertinent Labs: CBC    Component Value Date/Time   WBC 6.3 06/03/2022 0422   RBC 3.64 (L) 06/03/2022 0422   HGB 11.6 (L) 06/03/2022 0422   HGB 11.2 (L) 02/14/2022 1416   HGB 12.2 (L) 02/10/2018 1352   HCT 33.6 (L) 06/03/2022 0422   HCT 37.1 (L) 02/10/2018 1352   PLT 156 06/03/2022 0422   PLT 293 02/14/2022 1416   PLT 267 02/10/2018 1352   MCV 92.3 06/03/2022 0422   MCV 102 (H) 02/10/2018 1352   MCH 31.9 06/03/2022 0422   MCHC 34.5 06/03/2022 0422   RDW 12.6 06/03/2022 0422   RDW 13.1 02/10/2018 1352   LYMPHSABS 0.4 (L) 06/03/2022 0422   LYMPHSABS 1.3 02/10/2018 1352   MONOABS 0.5 06/03/2022 0422   EOSABS 0.0 06/03/2022 0422   EOSABS 0.1 02/10/2018 1352   BASOSABS 0.0 06/03/2022 0422  BASOSABS 0.0 02/10/2018 1352   CMP     Component Value Date/Time   NA 136 06/03/2022 0422   NA 138 02/10/2018 1352   K 3.9 06/03/2022 0422   CL 104 06/03/2022 0422   CO2 24 06/03/2022 0422   GLUCOSE 103 (H) 06/03/2022 0422   BUN 13 06/03/2022 0422   BUN 15 02/10/2018 1352   CREATININE 0.85 06/03/2022 0422   CREATININE 1.08 02/14/2022 1416   CALCIUM 8.8 (L) 06/03/2022 0422   PROT 5.7 (L) 06/03/2022 0422   PROT 6.4 02/10/2018 1352    ALBUMIN 2.9 (L) 06/03/2022 0422   ALBUMIN 4.0 02/10/2018 1352   AST 31 06/03/2022 0422   AST 19 02/14/2022 1416   ALT 26 06/03/2022 0422   ALT 17 02/14/2022 1416   ALKPHOS 60 06/03/2022 0422   BILITOT 0.8 06/03/2022 0422   BILITOT 0.6 02/14/2022 1416   GFRNONAA >60 06/03/2022 0422   GFRNONAA >60 02/14/2022 1416   GFRAA >60 07/21/2019 0950   RVP negative Blood culture pending Ammonia 43  Lactic Acid, Venous    Component Value Date/Time   LATICACIDVEN 0.6 06/02/2022 1348   Imaging Reviewed: CT-scan of the brain 06/02/22: 1. No evidence of acute intracranial abnormality. 2. ASPECTS is 10.   CT angio head and neck + CT perfusion 06/02/22: 1. No large vessel occlusion or proximal hemodynamically significant stenosis. 2. Small (5 mL) area of penumbra reported in the left posterior/inferior temporal region, possibly artifactual given streak artifact in this region  EEG 1/20:  "This study is within normal limits. The excessive beta activity seen in the background is most likely due to the effect of benzodiazepine and is a benign EEG pattern. No seizures or epileptiform discharges were seen throughout the recording.   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  There is conflicting history, patient states that he fell backwards over the commode in a pool of blood due to his Eliquis and could not get up. Patient's wife states that she found patient beside the bed laying on the floor and he could not get up.  Initial workup in the ED revealed patient with bradycardia, hypotension, and hypothermia with a rectal temperature of 86 F.  Patient's condition progressed to unresponsiveness with physical exam findings concerning for dilated unresponsive  pupils and negative for cough and gag reflex and code stroke was activated.  CT imaging is negative for acute intracranial abnormality and vessel imaging is negative for LVO or hemodynamically significant stenosis.  Cardiology evaluated patient with concerns for high degree AV block felt to be related to patient's hypothermia.  Patient was placed on dopamine for chronotropic support.  Etiology of patient's presentation was felt likely to be related to patient's presentation with significant hypothermia and bradycardia.  EEG is negative for seizure activity.  Follow-up examination on 1/21 reveals patient at his baseline status without neurologic deficit.  Low suspicion for neurologic etiology of patient's presentation at this time.  Recommendations: - As patient is at his baseline with improvement of hypothermia and support for bradycardia, will discontinue MRI order - Can resume home Eliquis if no contraindication and no procedure planned - Primary team follow up on VPA/Topiramate levels - Avoid hypotension - Continue management of co-morbid conditions per primary team as you are  -  Neurology to sign off, please re-engage if additional neurologic concerns arise  Anibal Henderson, AGACNP-BC [facility] [phone]   Attending Neurohospitalist Addendum Patient seen and examined with APP/Resident. Agree with the history and physical as documented above. Agree with the plan as documented, which I helped formulate. I have edited the note above to reflect my full findings and recommendations. I have independently reviewed the chart, obtained history, review of systems and examined the patient.I have personally reviewed pertinent head/neck/spine imaging (CT/MRI). Please feel free to call with any questions.  -- Su Monks, MD [facility] [phone]  If 7pm- 7am, please page neurology on call as listed in [place].

## 2022-06-03 NOTE — Progress Notes (Signed)
2330- Additional peripheral IV inserted by this RN at pt's right posterior forearm; flushed appropriately with blood return noted.   [id number]- New PIV used only to administer Depacon infusion  0130- Following infusion completion, patient's right arm noted to be acutely larger, appearing edematous, and displaying pink discoloration. E-Link notified and Pharmacy called by this RN in regards to concern for possible medication reaction/recommendations. Per Pharmacy, advised to apply cold compress and elevate arm; Per Pharmacy, known adverse effects of Depacon do not correlate with appearance of patient's affected arm, suggesting possible issues with peripheral IV. Pharmacy also confirmed that patient has been taking PO Depacon at home without known reactions. Cold compress applied to affected area, affected arm elevated above heart level with pillows, and skin marked with marker. Discoloration does not appear to follow the path of the PIV vein up patient's arm.   0200- This RN spoke with IV Team, requesting IV Team RN assess PIV for patency. Per IV Team, IV Team nurses cannot assess (non-ultrasound guided) peripheral Ivs on the floor, and a second 2H RN must perform an assessment on the IV instead. Also per IV Team, if IV assessments by primary and second RN determine line to be patent and thus medication reaction is further suspected, Pharmacy can order "labs" to investigate this. This RN confirmed that this RN correctly assessed patient's IV, including ability to flush, palpate flush through vein above insertion site, and positive for blood return. IV Team RN confirmed that this is correct for performing PIV assessment. Mitzi Hansen, 2H RN and Antony Haste, [place] RN separately assessed PIV in question for patency and confirmed patency. This RN spoke with Pharmacy in regards to blood work that may be completed in order to further investigate issue, per phone call with IV Team (see orders).   0400- Swelling to right arm  significantly reduced, discoloration present but noticeably lessened. Will continue to keep elevated above heart and assess for changes, including at marked border sites.   0415- Cardiology at patient bedside with this RN  [phone]- Patient heart rate decreased to 20s with this RN and Cardiology in room. Patient remains connected to [place]. EKG printed and handed to cardiologist. Plan for Dopamine infusion.  0520- Cardiology paged by this RN to inform that patient continues to experience bradyarrhythmias and is now taking more time to increase rate back to normal rate, as is now with heart rate remaining in 50s. Per Cardiology, they are aware and will continue to closely monitor.

## 2022-06-03 NOTE — Interval H&P Note (Signed)
History and Physical Interval Note:  06/03/2022 6:41 PM  Kenneth Bowen  has presented today for surgery, with the diagnosis of bradycardia.  The various methods of treatment have been discussed with the patient and family. After consideration of risks, benefits and other options for treatment, the patient has consented to  Procedure(s): TEMPORARY PACEMAKER (N/A) as a surgical intervention.  The patient's history has been reviewed, patient examined, no change in status, stable for surgery.  I have reviewed the patient's chart and labs.  Questions were answered to the patient's satisfaction.     Marisabel Macpherson

## 2022-06-03 NOTE — H&P (View-Only) (Signed)
   Cardiology/Shock Team  I was outside the room when patient developed recurrent sinus arrest with asystole and lost pulse and consciousness.   CPR initiated immediately. Patient with spontaneous recover of sinus node function with HRs in the 40. CCM at bedside as well. Dopamine increased and given adenosine.   Transcutaneous pacing pads placed with limited capture.   I d/w with Dr. Lovena Le in EP and plan for emergent TVP.   I called his wife and explained the indications/risks. Agrees to proceed. Cath lab called in.   Critical care time 50 mins  Glori Bickers, MD  6:35 PM

## 2022-06-03 NOTE — Progress Notes (Signed)
Orthopedic technician at patient bedside with this RN. Arm sling applied to patient's right arm and patient educated on importance of right arm immobilization; teach-back method utilized to confirm patient's understanding.

## 2022-06-03 NOTE — Progress Notes (Signed)
Rounding Note    Patient Name: Kenneth Bowen Date of Encounter: 06/03/2022  [place] Cardiologist: None   Subjective   No chest pain or sob. "I am hungry."  Inpatient Medications    Scheduled Meds:  Chlorhexidine Gluconate Cloth  6 each Topical Daily   leptospermum manuka honey  1 Application Topical Daily   topiramate  50 mg Oral QHS   Continuous Infusions:  DOPamine 5 mcg/kg/min (06/03/22 0900)   piperacillin-tazobactam (ZOSYN)  IV 3.375 g (06/03/22 0900)   valproate sodium 55 mL/hr at 06/03/22 0900   vancomycin Stopped (06/02/22 1738)   PRN Meds: docusate sodium, mouth rinse, polyethylene glycol   Vital Signs    Vitals:   06/03/22 0600 06/03/22 0630 06/03/22 0800 06/03/22 0900  BP: (!) 83/65 (!) 103/44 (!) 121/55 114/61  Pulse: 65 (!) 52 (!) 59 (!) 52  Resp: (!) 24 (!) '22 16 17  '$ Temp: (!) 97.2 F (36.2 C) (!) 97.3 F (36.3 C) (!) 97.3 F (36.3 C) (!) 97.5 F (36.4 C)  TempSrc:   Rectal   SpO2: 92% 94% 95% 94%  Weight:      Height:        Intake/Output Summary (Last 24 hours) at 06/03/2022 1030 Last data filed at 06/03/2022 0900 Gross per 24 hour  Intake 2830.68 ml  Output 2351 ml  Net 479.68 ml      06/02/2022    3:00 PM 06/02/2022   10:57 AM 02/14/2022    2:45 PM  Last 3 Weights  Weight (lbs) 183 lb 6.8 oz 185 lb 185 lb 4.8 oz  Weight (kg) 83.2 kg 83.915 kg 84.052 kg      Telemetry    Sinus brady with sinus pauses - Personally Reviewed  ECG    Sinus brady - Personally Reviewed  Physical Exam   GEN: No acute distress.   Neck: No JVD Cardiac: RRR, no murmurs, rubs, or gallops.  Respiratory: Clear to auscultation bilaterally. GI: Soft, nontender, non-distended  MS: No edema; No deformity. Neuro:  Nonfocal  Psych: Normal affect   Labs    High Sensitivity Troponin:   Recent Labs  Lab 06/02/22 1054 06/02/22 1250  TROPONINIHS 30* 35*     Chemistry Recent Labs  Lab 06/02/22 1250 06/02/22 1418 06/02/22 2234  06/03/22 0154 06/03/22 0422  NA 136 138  --  135 136  K 4.5 3.5 4.4 4.3 3.9  CL 104  --   --  104 104  CO2 26  --   --  24 24  GLUCOSE 92  --   --  102* 103*  BUN 16  --   --  13 13  CREATININE 0.64  --   --  0.79 0.85  CALCIUM 8.2*  --   --  8.7* 8.8*  MG 1.8  --  1.6*  --   --   PROT  --   --   --   --  5.7*  ALBUMIN  --   --   --   --  2.9*  AST  --   --   --   --  31  ALT  --   --   --   --  26  ALKPHOS  --   --   --   --  60  BILITOT  --   --   --   --  0.8  GFRNONAA >60  --   --  >60 >60  ANIONGAP 6  --   --  7 8    Lipids No results for input(s): "CHOL", "TRIG", "HDL", "LABVLDL", "LDLCALC", "CHOLHDL" in the last 168 hours.  Hematology Recent Labs  Lab 06/02/22 1054 06/02/22 1418 06/03/22 0154 06/03/22 0422  WBC 2.3*  --  6.4 6.3  RBC 3.38*  --  3.53* 3.64*  HGB 10.9* 11.6* 11.1* 11.6*  HCT 33.1* 34.0* 33.2* 33.6*  MCV 97.9  --  94.1 92.3  MCH 32.2  --  31.4 31.9  MCHC 32.9  --  33.4 34.5  RDW 12.8  --  12.7 12.6  PLT 130*  --  163 156   Thyroid  Recent Labs  Lab 06/02/22 1054 06/02/22 1650  TSH 6.783*  --   FREET4  --  0.99    BNPNo results for input(s): "BNP", "PROBNP" in the last 168 hours.  DDimer No results for input(s): "DDIMER" in the last 168 hours.   Radiology    EEG adult  Result Date: 06/02/2022 Lora Havens, MD     06/03/2022  7:56 AM Patient Name: Kenneth Bowen MRN: [id number] Epilepsy Attending: Lora Havens Referring Physician/Provider: Cristal Generous, NP Date: 04/03/2023 Duration: 22.42 mins Patient history: 89 yo M PMH complex partial seizures who presented to ED 1/20 from assisted living after possible syncope or near syncope. EEG to evaluate for seizure. Level of alertness: Awake AEDs during EEG study: None Technical aspects: This EEG study was done with scalp electrodes positioned according to the 10-20 International system of electrode placement. Electrical activity was reviewed with band pass filter of 1-'70Hz'$ , sensitivity  of 7 uV/mm, display speed of 82m/sec with a '60Hz'$  notched filter applied as appropriate. EEG data were recorded continuously and digitally stored.  Video monitoring was available and reviewed as appropriate. Description: The posterior dominant rhythm consists of 9 Hz activity of moderate voltage (25-35 uV) seen predominantly in posterior head regions, symmetric and reactive to eye opening and eye closing.  There is an excessive amount of 15 to 18 Hz beta activity distributed symmetrically and diffusely.  Hyperventilation and photic stimulation were not performed.   ABNORMALITY - Excessive beta, generalized IMPRESSION: This study is within normal limits. The excessive beta activity seen in the background is most likely due to the effect of benzodiazepine and is a benign EEG pattern. No seizures or epileptiform discharges were seen throughout the recording. A normal interictal EEG does not exclude the diagnosis of epilepsy. Priyanka OBarbra Sarks  CT ANGIO HEAD NECK W WO CM W PERF (CODE STROKE)  Result Date: 06/02/2022 CLINICAL DATA:  Neuro deficit, acute, stroke suspected pupils fixed, dilated, R gaze preference, no movement to noxious stimuli bilat EXAM: CT ANGIOGRAPHY HEAD AND NECK CT PERFUSION BRAIN TECHNIQUE: Multidetector CT imaging of the head and neck was performed using the standard protocol during bolus administration of intravenous contrast. Multiplanar CT image reconstructions and MIPs were obtained to evaluate the vascular anatomy. Carotid stenosis measurements (when applicable) are obtained utilizing NASCET criteria, using the distal internal carotid diameter as the denominator. Multiphase CT imaging of the brain was performed following IV bolus contrast injection. Subsequent parametric perfusion maps were calculated using RAPID software. RADIATION DOSE REDUCTION: This exam was performed according to the departmental dose-optimization program which includes automated exposure control, adjustment of the mA  and/or kV according to patient size and/or use of iterative reconstruction technique. CONTRAST:  1082mOMNIPAQUE IOHEXOL 350 MG/ML SOLN COMPARISON:  Same day CT head.  CTA head/neck July 13, 2016. FINDINGS: CTA NECK FINDINGS Aortic arch: [place]  vessel origins are patent. Right carotid system: Atherosclerosis at the carotid bifurcation without greater than 50% stenosis. Left carotid system: Atherosclerosis at the carotid bifurcation without greater than 50% stenosis Vertebral arteries: Left dominant. Both vertebral arteries are patent without greater than 50% stenosis Skeleton: Severe multilevel degenerative change. Other neck: No acute abnormality on limited assessment. Upper chest: No consolidation the visualized lung apices. Review of the MIP images confirms the above findings CTA HEAD FINDINGS Anterior circulation: Bilateral intracranial ICAs are patent with mild for age narrowing due to atherosclerosis. Bilateral MCAs and ACAs are patent without proximal hemodynamically significant stenosis. Posterior circulation: Bilateral intradural vertebral arteries, basilar artery and bilateral posterior cerebral arteries are patent without proximal hemodynamically significant stenosis. Venous sinuses: As permitted by contrast timing, patent. Small right transverse/sigmoid sinus, similar to the prior. Review of the MIP images confirms the above findings CT Brain Perfusion Findings: ASPECTS: 10 CBF (<30%) Volume: 63m Perfusion (Tmax>6.0s) volume: 580mMismatch Volume: 68m50mnfarction Location:None identified. IMPRESSION: 1. No large vessel occlusion or proximal hemodynamically significant stenosis. 2. Small (5 mL) area of penumbra reported in the left posterior/inferior temporal region, possibly artifactual given streak artifact in this region. Findings discussed with Dr. StaQuinn Axea telephone at 1:24 p.m. Electronically Signed   By: FreMargaretha SheffieldD.   On: 06/02/2022 13:32   CT HEAD CODE STROKE WO CONTRAST  Result Date:  06/02/2022 CLINICAL DATA:  Code stroke.  Neuro deficit, acute, stroke suspected EXAM: CT HEAD WITHOUT CONTRAST TECHNIQUE: Contiguous axial images were obtained from the base of the skull through the vertex without intravenous contrast. RADIATION DOSE REDUCTION: This exam was performed according to the departmental dose-optimization program which includes automated exposure control, adjustment of the mA and/or kV according to patient size and/or use of iterative reconstruction technique. COMPARISON:  CT head July 28, 2016. FINDINGS: Brain: No evidence of acute infarction, hemorrhage, hydrocephalus, extra-axial collection or mass lesion/mass effect. Similar patchy white matter hypodensities, compatible with chronic microvascular ischemic disease. Vascular: Calcific atherosclerosis.  No hyperdense vessel. Skull: No acute fracture. Sinuses/Orbits: Clear sinuses.  No acute orbital findings. Other: No mastoid effusions. ASPECTS ([facility] Early CT Score) Total score (0-10 with 10 being normal): 10. IMPRESSION: 1. No evidence of acute intracranial abnormality. 2. ASPECTS is 10. Code stroke imaging results were communicated on 06/02/2022 at 1:10 pm to provider STACK via secure text paging. Electronically Signed   By: FreMargaretha SheffieldD.   On: 06/02/2022 13:11   DG Chest Portable 1 View  Result Date: 06/02/2022 CLINICAL DATA:  Altered mental status.  Aspiration.  Infection. EXAM: PORTABLE CHEST 1 VIEW COMPARISON:  07/21/2019 FINDINGS: Extensive artifact overlies the chest. Heart size is normal. Mediastinal shadows appear normal. Poor inspiratory effort. Minimal patchy density at the lung bases could relate to the poor inspiration or could indicate mild basilar pneumonia. No lobar consolidation or collapse. No pulmonary edema or pleural effusion. IMPRESSION: Poor inspiratory effort. Minimal patchy density at the lung bases could relate to the poor inspiration or could indicate mild basilar pneumonia.  Electronically Signed   By: MarNelson ChimesD.   On: 06/02/2022 11:50    Cardiac Studies   none  Patient Profile     [age]o. male admitted cold with bradycardia though he denies being outside.  Assessment & Plan    Sinus node dysfunction/heart block - his HR is improved though he is still having some drops into the 30's. He is no longer cold.He may ultimately require pacing. Hypothermia - other  than exposure to the outside elements, the only reason that I am aware of that causes his degree of hypothermia is sepsis. He is on anti-biotics. Continue.     For questions or updates, please contact [place] Please consult [email or web address] for contact info under   Signed, Cristopher Peru, MD  06/03/2022, 10:30 AM

## 2022-06-03 NOTE — Progress Notes (Signed)
   Cardiology/Shock Team  I was outside the room when patient developed recurrent sinus arrest with asystole and lost pulse and consciousness.   CPR initiated immediately. Patient with spontaneous recover of sinus node function with HRs in the 40. CCM at bedside as well. Dopamine increased and given adenosine.   Transcutaneous pacing pads placed with limited capture.   I d/w with Dr. Lovena Le in EP and plan for emergent TVP.   I called his wife and explained the indications/risks. Agrees to proceed. Cath lab called in.   Critical care time 50 mins  Glori Bickers, MD  6:35 PM

## 2022-06-03 NOTE — Progress Notes (Signed)
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 On EMS arrival he was very bradycardic, requiring atropine x3. Noted to be hypothermic in ED with a rectal temp 86, as well as confused, HR 40s. Initial labs with a WBC 2.3.  Started on abx, bair hugger, warmed fluids w concern for sepsis + symptomatic bradycardia r/t hypothermia.  PCCM consulted in this setting    In ED he was unresponsive on my arrival. Pupils are dilated and fixed. ?surgical appearance of L, but no confirmed history of this at time of my exam.  Code stroke initiated    Pertinent  Medical History  Complex partial seizures RLS DVT on eliquis  Significant Hospital Events: Including procedures, antibiotic start and stop dates in addition to other pertinent events   1/20 ED with AMS,?syncope vs near syncope, hypothermia bradycardia. Code stroke   Interim History / Subjective:  Patient became severely bradycardic with heart rate in 30s overnight, cardiology was called, recommend starting dopamine  While on dopamine this afternoon patient went into complete heart block and became unresponsive, chest compression was done but immediately he started waking up with improvement in heart rate  Objective   Blood pressure (!) 111/55, pulse (!) 56, temperature 98.4 F (36.9 C), resp. rate 14, height '5\' 11"'$  (1.803 m), weight 83.2 kg, SpO2 94 %.        Intake/Output Summary (Last 24 hours) at 06/03/2022 1525 Last data filed at 06/03/2022 1400 Gross per 24 hour  Intake 2214.05 ml  Output 2401 ml  Net -186.95 ml   Filed Weights   06/02/22 1057 06/02/22 1500  Weight: 83.9 kg 83.2 kg     Examination: Physical exam: General: Acute on chronically ill-appearing male, lying on the bed HEENT: [place]/AT, eyes anicteric.  moist mucus membranes Neuro: Opens eyes with vocal stimuli, following simple commands Chest: Coarse breath sounds, no wheezes or rhonchi Heart: Bradycardic Abdomen: Soft, nontender, nondistended, bowel sounds present Skin: No rash   Resolved Hospital Problem list     Assessment & Plan:  Severe symptomatic bradycardia due to high-grade AV block Patient continued to have severe symptomatic bradycardia Overnight right he had an episode with heart rate in 73s, cardiology was consulted, recommended starting dopamine This afternoon he went into asystole for few seconds, came right back after initiation of CPR Cardiology was called again for temporary pacemaker placement Continue dopamine and increase it to 20 mics  Hypothermia with concern of sepsis Right lower lobe pneumonia, likely aspiration Patient presented with core temperature of 108 F He was leukopenic X-ray chest suggestive of right lower lobe pneumonia Continue IV Unasyn MRSA screen is negative, vancomycin was stopped Follow-up respiratory culture  Acute encephalopathy in the setting of severe symptomatic bradycardia Complex partial seizure CT head, CTA head and neck was negative for acute stroke Avoid sedation Resume valproic acid and topiramate EEG was negative for acute seizure  Unprovoked DVT Holding Eliquis for now for possible DVT Will resume anticoagulation by tomorrow   Best Practice (right click and "Reselect all SmartList Selections" daily)   Diet/type: Regular diet DVT prophylaxis: SCD GI prophylaxis: N/A Lines: N/A Foley:  N/A Code Status:  full code    EM MD d/w wife, full code, full scope of care at this time and understands possibility of intubation need but keep updated as clinical status unfolds (ie unless emergent call before intubation if possible) Last date of  multidisciplinary goals of care discussion [pending]  Labs   CBC: Recent Labs  Lab 06/02/22 1054 06/02/22 1418 06/03/22 0154 06/03/22 0422  WBC 2.3*  --  6.4 6.3  NEUTROABS  --   --   --  5.3  HGB 10.9* 11.6* 11.1* 11.6*  HCT 33.1* 34.0* 33.2* 33.6*  MCV 97.9  --  94.1 92.3  PLT 130*  --  163 456    Basic Metabolic Panel: Recent Labs  Lab 06/02/22 1250 06/02/22 1418 06/02/22 2234 06/03/22 0154 06/03/22 0422  NA 136 138  --  135 136  K 4.5 3.5 4.4 4.3 3.9  CL 104  --   --  104 104  CO2 26  --   --  24 24  GLUCOSE 92  --   --  102* 103*  BUN 16  --   --  13 13  CREATININE 0.64  --   --  0.79 0.85  CALCIUM 8.2*  --   --  8.7* 8.8*  MG 1.8  --  1.6*  --   --    GFR: Estimated Creatinine Clearance: 62.8 mL/min (by C-G formula based on SCr of 0.85 mg/dL). Recent Labs  Lab 06/02/22 1054 06/02/22 1131 06/02/22 1348 06/02/22 1650 06/03/22 0154 06/03/22 0422  PROCALCITON  --   --   --  <0.10  --   --   WBC 2.3*  --   --   --  6.4 6.3  LATICACIDVEN  --  0.8 0.6  --   --   --     Liver Function Tests: Recent Labs  Lab 06/03/22 0422  AST 31  ALT 26  ALKPHOS 60  BILITOT 0.8  PROT 5.7*  ALBUMIN 2.9*   No results for input(s): "LIPASE", "AMYLASE" in the last 168 hours. Recent Labs  Lab 06/02/22 1650 06/03/22 0422  AMMONIA 43* 42*    ABG    Component Value Date/Time   PHART 7.351 06/02/2022 1418   PCO2ART 41.5 06/02/2022 1418   PO2ART 45 (L) 06/02/2022 1418   HCO3 24.5 06/02/2022 1418   TCO2 26 06/02/2022 1418   ACIDBASEDEF 3.0 (H) 06/02/2022 1418   O2SAT 89 06/02/2022 1418     Coagulation Profile: Recent Labs  Lab 06/02/22 1650  INR 1.1    Cardiac Enzymes: No results for input(s): "CKTOTAL", "CKMB", "CKMBINDEX", "TROPONINI" in the last 168 hours.  HbA1C: Hgb A1c MFr Bld  Date/Time Value Ref Range Status  07/13/2016 02:31 AM 5.8 (H) 4.8 - 5.6 % Final    Comment:    (NOTE)         Pre-diabetes: 5.7 - 6.4         Diabetes: >6.4          Glycemic control for adults with diabetes: <7.0     CBG: No results for input(s): "GLUCAP" in the last 168 hours.   This patient is critically ill with multiple organ system failure which requires frequent high complexity decision making, assessment, support, evaluation, and titration of therapies. This was completed through the application of advanced monitoring technologies and extensive interpretation of multiple databases.  During this encounter critical care time was devoted to patient care services described in this note for 39 minutes.  Jacky Kindle, MD [facility] See Amion for pager If no response to pager, please call [phone] until 7pm After 7pm, Please call E-link [phone]

## 2022-06-03 NOTE — Progress Notes (Signed)
An USGPIV (ultrasound guided PIV) has been placed for short-term vasopressor infusion. A correctly placed ivWatch must be used when administering Vasopressors. Should this treatment be needed beyond 72 hours, central line access should be obtained.  It will be the responsibility of the bedside nurse to follow best practice to prevent extravasations.   ?

## 2022-06-03 NOTE — Progress Notes (Signed)
1000 Pt. experiencing significant bradycardia (20-50 bpm) accompanied by 3-6 second sinus pauses. Pt. currently connected to zoll for monitoring. ECG from 1/21 AM shows AV block. Paged on call cardiologist for guidance; cardiologist at bedside. This RN instructed to continue to monitor at this time with no new interventions. This RN also instructed to allow patient to eat. CCM provider updated on plan of care at this time.

## 2022-06-03 NOTE — Progress Notes (Signed)
Lock on code cart in patient's room removed by previous shift, NO plastic seals broken in any drawers; all supplies in and on cart unused. Spoke with [place] and 2H Pharmacist, plan to replace code cart and NOT charge [facility] for unused unlocked cart.

## 2022-06-04 ENCOUNTER — Encounter (HOSPITAL_COMMUNITY): Payer: Self-pay | Admitting: Internal Medicine

## 2022-06-04 ENCOUNTER — Inpatient Hospital Stay (HOSPITAL_COMMUNITY)
Admission: EM | Disposition: A | Discharge status: Skilled Nursing Facility | Payer: Self-pay | Source: Skilled Nursing Facility | Attending: Internal Medicine

## 2022-06-04 ENCOUNTER — Inpatient Hospital Stay (HOSPITAL_COMMUNITY): Payer: Medicare Other

## 2022-06-04 DIAGNOSIS — I442 Atrioventricular block, complete: Secondary | ICD-10-CM

## 2022-06-04 DIAGNOSIS — G9340 Encephalopathy, unspecified: Secondary | ICD-10-CM | POA: Diagnosis not present

## 2022-06-04 DIAGNOSIS — T68XXXA Hypothermia, initial encounter: Secondary | ICD-10-CM | POA: Diagnosis not present

## 2022-06-04 HISTORY — PX: PACEMAKER IMPLANT: EP1218

## 2022-06-04 LAB — MAGNESIUM: Magnesium: 1.9 mg/dL (ref 1.7–2.4)

## 2022-06-04 LAB — BASIC METABOLIC PANEL
Anion gap: 8 (ref 5–15)
BUN: 17 mg/dL (ref 8–23)
CO2: 23 mmol/L (ref 22–32)
Calcium: 8.7 mg/dL — ABNORMAL LOW (ref 8.9–10.3)
Chloride: 105 mmol/L (ref 98–111)
Creatinine, Ser: 0.93 mg/dL (ref 0.61–1.24)
GFR, Estimated: >60 mL/min (ref >60)
Glucose, Bld: 109 mg/dL — ABNORMAL HIGH (ref 70–99)
Potassium: 3.9 mmol/L (ref 3.5–5.1)
Sodium: 136 mmol/L (ref 135–145)

## 2022-06-04 LAB — SURGICAL PCR SCREEN
MRSA, PCR: NEGATIVE
Staphylococcus aureus: POSITIVE — AB

## 2022-06-04 LAB — TOPIRAMATE LEVEL: Topiramate Lvl: 2 ug/mL (ref 2.0–25.0)

## 2022-06-04 SURGERY — PACEMAKER IMPLANT

## 2022-06-04 MED ORDER — SODIUM CHLORIDE 0.9% FLUSH
3.0000 mL | Freq: Two times a day (BID) | INTRAVENOUS | Status: DC
  Administered 2022-06-04 – 2022-06-09 (×9): 3 mL via INTRAVENOUS

## 2022-06-04 MED ORDER — SODIUM CHLORIDE 0.9 % IV SOLN: INTRAVENOUS | Status: DC

## 2022-06-04 MED ORDER — FENTANYL CITRATE (PF) 100 MCG/2ML IJ SOLN
INTRAMUSCULAR | Status: AC
  Filled 2022-06-04: qty 2

## 2022-06-04 MED ORDER — MIDAZOLAM HCL 5 MG/5ML IJ SOLN
INTRAMUSCULAR | Status: AC
  Filled 2022-06-04: qty 5

## 2022-06-04 MED ORDER — ONDANSETRON HCL 4 MG/2ML IJ SOLN: 4.0000 mg | Freq: Four times a day (QID) | INTRAMUSCULAR | Status: DC | PRN

## 2022-06-04 MED ORDER — LIDOCAINE HCL 1 % IJ SOLN
INTRAMUSCULAR | Status: AC
  Filled 2022-06-04: qty 60

## 2022-06-04 MED ORDER — HEPARIN (PORCINE) IN NACL 1000-0.9 UT/500ML-% IV SOLN
INTRAVENOUS | Status: DC | PRN
  Administered 2022-06-04: 500 mL

## 2022-06-04 MED ORDER — MUPIROCIN 2 % EX OINT
1.0000 | TOPICAL_OINTMENT | Freq: Two times a day (BID) | CUTANEOUS | Status: DC
  Administered 2022-06-04 – 2022-06-09 (×9): 1 via NASAL
  Filled 2022-06-04 (×2): qty 22

## 2022-06-04 MED ORDER — HEPARIN (PORCINE) IN NACL 1000-0.9 UT/500ML-% IV SOLN
INTRAVENOUS | Status: AC
  Filled 2022-06-04: qty 500

## 2022-06-04 MED ORDER — CHLORHEXIDINE GLUCONATE 4 % EX LIQD
60.0000 mL | Freq: Once | CUTANEOUS | Status: AC
  Administered 2022-06-04: 4 via TOPICAL

## 2022-06-04 MED ORDER — DOCUSATE SODIUM 100 MG PO CAPS
100.0000 mg | ORAL_CAPSULE | Freq: Two times a day (BID) | ORAL | Status: DC
  Administered 2022-06-04 – 2022-06-09 (×7): 100 mg via ORAL
  Filled 2022-06-04 (×7): qty 1

## 2022-06-04 MED ORDER — POLYETHYLENE GLYCOL 3350 17 G PO PACK
17.0000 g | PACK | Freq: Every day | ORAL | Status: DC
  Administered 2022-06-06 – 2022-06-08 (×3): 17 g via ORAL
  Filled 2022-06-04 (×3): qty 1

## 2022-06-04 MED ORDER — GERHARDT'S BUTT CREAM
TOPICAL_CREAM | Freq: Two times a day (BID) | CUTANEOUS | Status: DC | PRN
  Filled 2022-06-04: qty 1

## 2022-06-04 MED ORDER — CEFAZOLIN SODIUM-DEXTROSE 2-4 GM/100ML-% IV SOLN
INTRAVENOUS | Status: AC
  Filled 2022-06-04: qty 100

## 2022-06-04 MED ORDER — ACETAMINOPHEN 325 MG PO TABS
325.0000 mg | ORAL_TABLET | ORAL | Status: DC | PRN
  Administered 2022-06-05 – 2022-06-06 (×2): 650 mg via ORAL
  Filled 2022-06-04 (×2): qty 2

## 2022-06-04 MED ORDER — FENTANYL CITRATE (PF) 100 MCG/2ML IJ SOLN
INTRAMUSCULAR | Status: DC | PRN
  Administered 2022-06-04: 12.5 ug via INTRAVENOUS

## 2022-06-04 MED ORDER — CHLORHEXIDINE GLUCONATE CLOTH 2 % EX PADS
6.0000 | MEDICATED_PAD | Freq: Every day | CUTANEOUS | Status: AC
  Administered 2022-06-05 – 2022-06-09 (×5): 6 via TOPICAL

## 2022-06-04 MED ORDER — SODIUM CHLORIDE 0.9 % IV SOLN
INTRAVENOUS | Status: AC
  Filled 2022-06-04: qty 2

## 2022-06-04 MED ORDER — CHLORHEXIDINE GLUCONATE 4 % EX LIQD
CUTANEOUS | Status: AC
  Filled 2022-06-04: qty 15

## 2022-06-04 MED ORDER — SODIUM CHLORIDE 0.9 % IV SOLN: 250.0000 mL | INTRAVENOUS | Status: DC

## 2022-06-04 MED ORDER — CEFAZOLIN SODIUM-DEXTROSE 1-4 GM/50ML-% IV SOLN
1.0000 g | Freq: Three times a day (TID) | INTRAVENOUS | Status: AC
  Administered 2022-06-04 – 2022-06-05 (×3): 1 g via INTRAVENOUS
  Filled 2022-06-04 (×5): qty 50

## 2022-06-04 MED ORDER — SODIUM CHLORIDE 0.9% FLUSH: 3.0000 mL | INTRAVENOUS | Status: DC | PRN

## 2022-06-04 MED ORDER — CEFAZOLIN SODIUM-DEXTROSE 2-4 GM/100ML-% IV SOLN
2.0000 g | INTRAVENOUS | Status: AC
  Administered 2022-06-04: 2 g via INTRAVENOUS

## 2022-06-04 MED ORDER — LIDOCAINE HCL (PF) 1 % IJ SOLN
INTRAMUSCULAR | Status: DC | PRN
  Administered 2022-06-04: 60 mL

## 2022-06-04 MED ORDER — SODIUM CHLORIDE 0.9 % IV SOLN
80.0000 mg | INTRAVENOUS | Status: AC
  Administered 2022-06-04: 80 mg

## 2022-06-04 MED ORDER — CHLORHEXIDINE GLUCONATE 4 % EX LIQD: 60.0000 mL | Freq: Once | CUTANEOUS | Status: DC

## 2022-06-04 MED ORDER — MIDAZOLAM HCL 5 MG/5ML IJ SOLN
INTRAMUSCULAR | Status: DC | PRN
  Administered 2022-06-04: 1 mg via INTRAVENOUS

## 2022-06-04 SURGICAL SUPPLY — 14 items
CABLE SURGICAL S-101-97-12 (CABLE) ×2 IMPLANT
CATH CPS LOCATOR 3D MED (CATHETERS) IMPLANT
HELIX LOCKING TOOL (MISCELLANEOUS) ×1
KIT MICROPUNCTURE NIT STIFF (SHEATH) IMPLANT
LEAD ULTIPACE 52 LPA1231/52 (Lead) IMPLANT
LEAD ULTIPACE 65 LPA1231/65 (Lead) IMPLANT
PACEMAKER ASSURITY DR-RF (Pacemaker) IMPLANT
PAD DEFIB RADIO PHYSIO CONN (PAD) ×2 IMPLANT
SHEATH 7FR PRELUDE SNAP 13 (SHEATH) IMPLANT
SHEATH 9FR PRELUDE SNAP 13 (SHEATH) IMPLANT
SLITTER AGILIS HISPRO (INSTRUMENTS) IMPLANT
TOOL HELIX LOCKING (MISCELLANEOUS) IMPLANT
TRAY PACEMAKER INSERTION (PACKS) ×2 IMPLANT
WIRE HI TORQ VERSACORE-J 145CM (WIRE) IMPLANT

## 2022-06-04 NOTE — Progress Notes (Addendum)
Rounding Note    Patient Name: Kenneth Bowen Date of Encounter: 06/04/2022  [place] Cardiologist: None  Subjective   Hungry, denies CP, SOB  Inpatient Medications    Scheduled Meds:  Chlorhexidine Gluconate Cloth  6 each Topical Daily   divalproex  500 mg Oral Daily   docusate sodium  100 mg Oral BID   enoxaparin (LOVENOX) injection  40 mg Subcutaneous Q24H   leptospermum manuka honey  1 Application Topical Daily   polyethylene glycol  17 g Oral Daily   sodium chloride flush  3 mL Intravenous Q12H   topiramate  50 mg Oral QHS   Continuous Infusions:  sodium chloride     piperacillin-tazobactam (ZOSYN)  IV 12.5 mL/hr at 06/04/22 0900   PRN Meds: sodium chloride, acetaminophen, docusate sodium, Gerhardt's butt cream, ondansetron (ZOFRAN) IV, mouth rinse, polyethylene glycol, sodium chloride flush   Vital Signs    Vitals:   06/04/22 0730 06/04/22 0800 06/04/22 0830 06/04/22 0900  BP: 137/85 114/60 (!) 152/65 (!) 150/63  Pulse: (!) 50 (!) 52 (!) 54 (!) 50  Resp: '17 16 15 14  '$ Temp: 98.2 F (36.8 C) 98.1 F (36.7 C) 97.7 F (36.5 C) 97.7 F (36.5 C)  TempSrc:      SpO2: 98% 98% 97% 97%  Weight:      Height:        Intake/Output Summary (Last 24 hours) at 06/04/2022 0943 Last data filed at 06/04/2022 0900 Gross per 24 hour  Intake 638.27 ml  Output 800 ml  Net -161.73 ml      06/04/2022    5:00 AM 06/02/2022    3:00 PM 06/02/2022   10:57 AM  Last 3 Weights  Weight (lbs) 181 lb 14.1 oz 183 lb 6.8 oz 185 lb  Weight (kg) 82.5 kg 83.2 kg 83.915 kg      Telemetry    Intermittent pacing appears to be Mobitz one with long 1st degree when he is conducting - Personally Reviewed  ECG    No new EKGs - Personally Reviewed  Physical Exam   GEN: No acute distress.   Neck: No JVD Cardiac: RRR, no murmurs, rubs, or gallops.  Respiratory: CTA b/l. GI: Soft, nontender, non-distended  MS: No edema; No deformity. Neuro:  Nonfocal  Psych: Normal  affect   Labs    High Sensitivity Troponin:   Recent Labs  Lab 06/02/22 1054 06/02/22 1250  TROPONINIHS 30* 35*     Chemistry Recent Labs  Lab 06/02/22 1250 06/02/22 1418 06/02/22 2234 06/03/22 0154 06/03/22 0422 06/03/22 2108 06/04/22 0440  NA 136   < >  --  135 136  --  136  K 4.5   < > 4.4 4.3 3.9  --  3.9  CL 104  --   --  104 104  --  105  CO2 26  --   --  24 24  --  23  GLUCOSE 92  --   --  102* 103*  --  109*  BUN 16  --   --  13 13  --  17  CREATININE 0.64  --   --  0.79 0.85 1.07 0.93  CALCIUM 8.2*  --   --  8.7* 8.8*  --  8.7*  MG 1.8  --  1.6*  --   --   --  1.9  PROT  --   --   --   --  5.7*  --   --  ALBUMIN  --   --   --   --  2.9*  --   --   AST  --   --   --   --  31  --   --   ALT  --   --   --   --  26  --   --   ALKPHOS  --   --   --   --  60  --   --   BILITOT  --   --   --   --  0.8  --   --   GFRNONAA >60  --   --  >60 >60 >60 >60  ANIONGAP 6  --   --  7 8  --  8   < > = values in this interval not displayed.    Lipids No results for input(s): "CHOL", "TRIG", "HDL", "LABVLDL", "LDLCALC", "CHOLHDL" in the last 168 hours.  Hematology Recent Labs  Lab 06/03/22 0154 06/03/22 0422 06/03/22 2108  WBC 6.4 6.3 8.3  RBC 3.53* 3.64* 3.61*  HGB 11.1* 11.6* 11.7*  HCT 33.2* 33.6* 35.1*  MCV 94.1 92.3 97.2  MCH 31.4 31.9 32.4  MCHC 33.4 34.5 33.3  RDW 12.7 12.6 13.1  PLT 163 156 149*   Thyroid  Recent Labs  Lab 06/02/22 1054 06/02/22 1650  TSH 6.783*  --   FREET4  --  0.99    BNPNo results for input(s): "BNP", "PROBNP" in the last 168 hours.  DDimer No results for input(s): "DDIMER" in the last 168 hours.   Radiology    DG CHEST PORT 1 VIEW Result Date: 06/04/2022 CLINICAL DATA:  Acute on chronic respiratory failure EXAM: PORTABLE CHEST 1 VIEW COMPARISON:  None Available. FINDINGS: Transvenous pacing wire with tip projecting over the ventricles. No pneumothorax. No pulmonary edema. LEFT basilar atelectasis. IMPRESSION: 1. Transvenous  pacing wire in place. 2. LEFT basilar atelectasis. Electronically Signed   By: Suzy Bouchard M.D.   On: 06/04/2022 09:15   EEG adult Result Date: 06/02/2022 Lora Havens, MD     06/03/2022  7:56 AM Patient Name: Kenneth Bowen MRN: [id number] Epilepsy Attending: Lora Havens Referring Physician/Provider: Cristal Generous, NP Date: 04/03/2023 Duration: 22.42 mins Patient history: 89 yo M PMH complex partial seizures who presented to ED 1/20 from assisted living after possible syncope or near syncope. EEG to evaluate for seizure. Level of alertness: Awake AEDs during EEG study: None Technical aspects: This EEG study was done with scalp electrodes positioned according to the 10-20 International system of electrode placement. Electrical activity was reviewed with band pass filter of 1-'70Hz'$ , sensitivity of 7 uV/mm, display speed of 72m/sec with a '60Hz'$  notched filter applied as appropriate. EEG data were recorded continuously and digitally stored.  Video monitoring was available and reviewed as appropriate. Description: The posterior dominant rhythm consists of 9 Hz activity of moderate voltage (25-35 uV) seen predominantly in posterior head regions, symmetric and reactive to eye opening and eye closing.  There is an excessive amount of 15 to 18 Hz beta activity distributed symmetrically and diffusely.  Hyperventilation and photic stimulation were not performed.   ABNORMALITY - Excessive beta, generalized IMPRESSION: This study is within normal limits. The excessive beta activity seen in the background is most likely due to the effect of benzodiazepine and is a benign EEG pattern. No seizures or epileptiform discharges were seen throughout the recording. A normal interictal EEG does not exclude the diagnosis of epilepsy. Priyanka  Barbra Sarks   CT ANGIO HEAD NECK W WO CM W PERF (CODE STROKE) Result Date: 06/02/2022 CLINICAL DATA:  Neuro deficit, acute, stroke suspected pupils fixed, dilated, R gaze  preference, no movement to noxious stimuli bilat EXAM: CT ANGIOGRAPHY HEAD AND NECK CT PERFUSION BRAIN TECHNIQUE: Multidetector CT imaging of the head and neck was performed using the standard protocol during bolus administration of intravenous contrast. Multiplanar CT image reconstructions and MIPs were obtained to evaluate the vascular anatomy. Carotid stenosis measurements (when applicable) are obtained utilizing NASCET criteria, using the distal internal carotid diameter as the denominator. Multiphase CT imaging of the brain was performed following IV bolus contrast injection. Subsequent parametric perfusion maps were calculated using RAPID software. RADIATION DOSE REDUCTION: This exam was performed according to the departmental dose-optimization program which includes automated exposure control, adjustment of the mA and/or kV according to patient size and/or use of iterative reconstruction technique. CONTRAST:  161m OMNIPAQUE IOHEXOL 350 MG/ML SOLN COMPARISON:  Same day CT head.  CTA head/neck July 13, 2016. FINDINGS: CTA NECK FINDINGS Aortic arch: Great vessel origins are patent. Right carotid system: Atherosclerosis at the carotid bifurcation without greater than 50% stenosis. Left carotid system: Atherosclerosis at the carotid bifurcation without greater than 50% stenosis Vertebral arteries: Left dominant. Both vertebral arteries are patent without greater than 50% stenosis Skeleton: Severe multilevel degenerative change. Other neck: No acute abnormality on limited assessment. Upper chest: No consolidation the visualized lung apices. Review of the MIP images confirms the above findings CTA HEAD FINDINGS Anterior circulation: Bilateral intracranial ICAs are patent with mild for age narrowing due to atherosclerosis. Bilateral MCAs and ACAs are patent without proximal hemodynamically significant stenosis. Posterior circulation: Bilateral intradural vertebral arteries, basilar artery and bilateral posterior  cerebral arteries are patent without proximal hemodynamically significant stenosis. Venous sinuses: As permitted by contrast timing, patent. Small right transverse/sigmoid sinus, similar to the prior. Review of the MIP images confirms the above findings CT Brain Perfusion Findings: ASPECTS: 10 CBF (<30%) Volume: 019mPerfusion (Tmax>6.0s) volume: 46m72mismatch Volume: 46mL20mfarction Location:None identified. IMPRESSION: 1. No large vessel occlusion or proximal hemodynamically significant stenosis. 2. Small (5 mL) area of penumbra reported in the left posterior/inferior temporal region, possibly artifactual given streak artifact in this region. Findings discussed with Dr. StacQuinn Axe telephone at 1:24 p.m. Electronically Signed   By: FredMargaretha Sheffield.   On: 06/02/2022 13:32   CT HEAD CODE STROKE WO CONTRAST Result Date: 06/02/2022 CLINICAL DATA:  Code stroke.  Neuro deficit, acute, stroke suspected EXAM: CT HEAD WITHOUT CONTRAST TECHNIQUE: Contiguous axial images were obtained from the base of the skull through the vertex without intravenous contrast. RADIATION DOSE REDUCTION: This exam was performed according to the departmental dose-optimization program which includes automated exposure control, adjustment of the mA and/or kV according to patient size and/or use of iterative reconstruction technique. COMPARISON:  CT head July 28, 2016. FINDINGS: Brain: No evidence of acute infarction, hemorrhage, hydrocephalus, extra-axial collection or mass lesion/mass effect. Similar patchy white matter hypodensities, compatible with chronic microvascular ischemic disease. Vascular: Calcific atherosclerosis.  No hyperdense vessel. Skull: No acute fracture. Sinuses/Orbits: Clear sinuses.  No acute orbital findings. Other: No mastoid effusions. ASPECTS ([facility] Early CT Score) Total score (0-10 with 10 being normal): 10. IMPRESSION: 1. No evidence of acute intracranial abnormality. 2. ASPECTS is 10. Code stroke  imaging results were communicated on 06/02/2022 at 1:10 pm to provider STACK via secure text paging. Electronically Signed   By: FredJamesetta So  On: 06/02/2022 13:11   DG Chest Portable 1 View Result Date: 06/02/2022 CLINICAL DATA:  Altered mental status.  Aspiration.  Infection. EXAM: PORTABLE CHEST 1 VIEW COMPARISON:  07/21/2019 FINDINGS: Extensive artifact overlies the chest. Heart size is normal. Mediastinal shadows appear normal. Poor inspiratory effort. Minimal patchy density at the lung bases could relate to the poor inspiration or could indicate mild basilar pneumonia. No lobar consolidation or collapse. No pulmonary edema or pleural effusion. IMPRESSION: Poor inspiratory effort. Minimal patchy density at the lung bases could relate to the poor inspiration or could indicate mild basilar pneumonia. Electronically Signed   By: Nelson Chimes M.D.   On: 06/02/2022 11:50    Cardiac Studies   08/04/21: TTE 1. Left ventricular ejection fraction, by estimation, is 60 to 65%. The  left ventricle has normal function. The left ventricle has no regional  wall motion abnormalities. Left ventricular diastolic parameters are  indeterminate.   2. Right ventricular systolic function is normal. The right ventricular  size is mildly enlarged. Tricuspid regurgitation signal is inadequate for  assessing PA pressure.   3. The mitral valve is grossly normal. Trivial mitral valve  regurgitation.   4. The aortic valve is grossly normal. There is moderate calcification of  the aortic valve. Aortic valve regurgitation is not visualized.   5. The inferior vena cava is normal in size with greater than 50%  respiratory variability, suggesting right atrial pressure of 3 mmHg.   Comparison(s): No prior Echocardiogram.   Patient Profile     89 y.o. male w/PMHx of seizure d/o, DVT (on eliquis at home), RLS, prostate Ca admitted 06/02/22 after a syncopal event found bradycardic, given atropine, also noted  hypothermic with concerns of possible sepsis, acute encephalopathy  Code stroke called with development of becoming unresponsive with fixed pupils Neuro consulted CT imaging is negative for acute intracranial abnormality and vessel imaging is negative for LVO or hemodynamically significant stenosis. Examination fluctuates with patient responsiveness, command following, but patient's pupils have remained dilated and nonresponsive throughout their consult/exam Planned for MRI, EEG (was normal) ?if 2/2 sepsis Wound care RN for b/l elbow wounds b/l LE wounds as well >> s/o same day  Started on dopamine for his bradycardia since his admission  EP consulted yesterday remained bradycardic but improved, unclear etiology of his hypothermia, ? sepsis Later in the day developed asystole w/LOC and emergent tamp wire placed  He tells me he has had a couple falls 2/2 to a parkinson-like problem, but never syncope before now  Assessment & Plan    Syncope Symptomatic bradycardia, sinus arrest Emergent temp wire placed yesterday  No traditional nodal blocking agents at home/here (topamax might, though <1%,  we will not stop his anti seizure regime)   He will need PPM He is NPO for now, though doubt will be done today Will try to get him done this afternoon   Hypothermia PNA MSSA screen negative and Abx >> zosyn No leukocytosis Lactc acid 0.6 Procal <0.10 D/w CCM MD, doubts infected planned to stop abx today  5. DVT by history No eliquis here SCD's Plan to resume [place] probably in a couple days post pacemaker once pocket stability is assessed   For questions or updates, please contact [place] Please consult [email or web address] for contact info under     Signed, Baldwin Jamaica, PA-C  06/04/2022, 9:43 AM    EP Attending  Patient seen and examined. Agree with above. The patient has had 15 second episode of  asystole due to CHB. No evidence of infection. He will undergo insertion  of a DDD PPM and removal of the temp pacemaker.  Carleene Overlie Taylor,MD

## 2022-06-04 NOTE — Progress Notes (Signed)
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 On EMS arrival he was very bradycardic, requiring atropine x3. Noted to be hypothermic in ED with a rectal temp 86, as well as confused, HR 40s. Initial labs with a WBC 2.3.  Started on abx, bair hugger, warmed fluids w concern for sepsis + symptomatic bradycardia r/t hypothermia.  PCCM consulted in this setting    In ED he was unresponsive on my arrival. Pupils are dilated and fixed. ?surgical appearance of L, but no confirmed history of this at time of my exam.  Code stroke initiated    Pertinent  Medical History  Complex partial seizures RLS DVT on eliquis  Significant Hospital Events: Including procedures, antibiotic start and stop dates in addition to other pertinent events   1/20 ED with AMS,?syncope vs near syncope, hypothermia bradycardia. Code stroke   Interim History / Subjective:  Patient underwent TVP last night Remains on dopamine at 5 mics Denies any complaint  Objective   Blood pressure (!) 157/62, pulse (!) 54, temperature 98.4 F (36.9 C), resp. rate 18, height '5\' 11"'$  (1.803 m), weight 82.5 kg, SpO2 97 %.        Intake/Output Summary (Last 24 hours) at 06/04/2022 0835 Last data filed at 06/04/2022 0700 Gross per 24 hour  Intake 630.01 ml  Output 800 ml  Net -169.99 ml   Filed Weights   06/02/22 1057 06/02/22 1500 06/04/22 0500  Weight: 83.9 kg 83.2 kg 82.5 kg    Examination: Physical exam: General: Elderly male, lying on the bed HEENT: [place]/AT, eyes anicteric.  moist mucus membranes Neuro: Awake, following commands, moving all 4 extremities Chest: Coarse breath  sounds, no wheezes or rhonchi Heart: Paced rhythm, no murmurs or gallops Abdomen: Soft, nontender, nondistended, bowel sounds present Skin: No rash   Resolved Hospital Problem list     Assessment & Plan:  Severe symptomatic bradycardia due to high-grade AV block Patient had bradycardia arrest x 3 now Underwent TVP last night, currently paced rhythm at 60 bpm Will stop dopamine EP is consulted, probably patient will go for PPM today He is asymptomatic now  Hypothermia likely due to cold exposure Sepsis was ruled out After patient pneumonitis Patient presented with core temperature of 52 F He was leukopenic Now his temperature is back to normal, WBC improved Stop antibiotics Cultures have been negative  Acute encephalopathy in the setting of severe symptomatic bradycardia Complex partial seizure CT head, CTA head and neck was negative for acute stroke Avoid sedation Continue valproic acid and topiramate EEG was negative for acute seizure  Unprovoked DVT Holding Eliquis for now for PPM Will resume anticoagulation once cleared by EP   Best Practice (right click and "Reselect all SmartList Selections" daily)   Diet/type: Regular diet DVT prophylaxis: SCD GI prophylaxis: N/A Lines: N/A Foley:  N/A Code Status:  full code    1/22: Patient's wife was updated, she decided to keep him full code.  She is in agreement to proceed with PPM  Labs   CBC: Recent Labs  Lab 06/02/22 1054 06/02/22 1418 06/03/22 0154 06/03/22 0422 06/03/22 2108  WBC 2.3*  --  6.4 6.3 8.3  NEUTROABS  --   --   --  5.3  --   HGB 10.9* 11.6* 11.1* 11.6* 11.7*  HCT 33.1* 34.0* 33.2* 33.6* 35.1*  MCV 97.9  --  94.1 92.3 97.2  PLT 130*  --  163 156 149*    Basic Metabolic Panel: Recent Labs  Lab 06/02/22 1250 06/02/22 1418 06/02/22 2234 06/03/22 0154 06/03/22 0422 06/03/22 2108 06/04/22 0440  NA 136 138  --  135 136  --  136  K 4.5 3.5 4.4 4.3 3.9  --  3.9  CL 104  --   --  104  104  --  105  CO2 26  --   --  24 24  --  23  GLUCOSE 92  --   --  102* 103*  --  109*  BUN 16  --   --  13 13  --  17  CREATININE 0.64  --   --  0.79 0.85 1.07 0.93  CALCIUM 8.2*  --   --  8.7* 8.8*  --  8.7*  MG 1.8  --  1.6*  --   --   --  1.9   GFR: Estimated Creatinine Clearance: 57.4 mL/min (by C-G formula based on SCr of 0.93 mg/dL). Recent Labs  Lab 06/02/22 1054 06/02/22 1131 06/02/22 1348 06/02/22 1650 06/03/22 0154 06/03/22 0422 06/03/22 2108  PROCALCITON  --   --   --  <0.10  --   --   --   WBC 2.3*  --   --   --  6.4 6.3 8.3  LATICACIDVEN  --  0.8 0.6  --   --   --   --     Liver Function Tests: Recent Labs  Lab 06/03/22 0422  AST 31  ALT 26  ALKPHOS 60  BILITOT 0.8  PROT 5.7*  ALBUMIN 2.9*   No results for input(s): "LIPASE", "AMYLASE" in the last 168 hours. Recent Labs  Lab 06/02/22 1650 06/03/22 0422  AMMONIA 43* 42*    ABG    Component Value Date/Time   PHART 7.351 06/02/2022 1418   PCO2ART 41.5 06/02/2022 1418   PO2ART 45 (L) 06/02/2022 1418   HCO3 24.5 06/02/2022 1418   TCO2 26 06/02/2022 1418   ACIDBASEDEF 3.0 (H) 06/02/2022 1418   O2SAT 89 06/02/2022 1418     Coagulation Profile: Recent Labs  Lab 06/02/22 1650  INR 1.1    Cardiac Enzymes: No results for input(s): "CKTOTAL", "CKMB", "CKMBINDEX", "TROPONINI" in the last 168 hours.  HbA1C: Hgb A1c MFr Bld  Date/Time Value Ref Range Status  07/13/2016 02:31 AM 5.8 (H) 4.8 - 5.6 % Final    Comment:    (NOTE)         Pre-diabetes: 5.7 - 6.4         Diabetes: >6.4         Glycemic control for adults with diabetes: <7.0     CBG: Recent Labs  Lab 06/03/22 1952  GLUCAP 128*     This patient is critically ill with multiple organ system failure which requires frequent high complexity decision making, assessment, support, evaluation, and titration of therapies. This was completed through the application of advanced monitoring technologies and extensive interpretation of  multiple databases.  During this encounter critical care time was devoted to patient care services described in this note for 37 minutes.    Jacky Kindle, MD [place] [facility] See Amion for pager If no response  to pager, please call [phone] until 7pm After 7pm, Please call E-link [phone]

## 2022-06-04 NOTE — Progress Notes (Signed)
Pt left for CV lab

## 2022-06-04 NOTE — Progress Notes (Signed)
Rounding Note    Patient Name: Kenneth Bowen Date of Encounter: 06/04/2022  [place] Cardiologist: None   Subjective   No complaints.   Inpatient Medications    Scheduled Meds:  Chlorhexidine Gluconate Cloth  6 each Topical Daily   divalproex  500 mg Oral Daily   enoxaparin (LOVENOX) injection  40 mg Subcutaneous Q24H   leptospermum manuka honey  1 Application Topical Daily   sodium chloride flush  3 mL Intravenous Q12H   topiramate  50 mg Oral QHS   Continuous Infusions:  sodium chloride     DOPamine 5 mcg/kg/min (06/04/22 0700)   piperacillin-tazobactam (ZOSYN)  IV Stopped (06/04/22 0418)   PRN Meds: sodium chloride, acetaminophen, docusate sodium, ondansetron (ZOFRAN) IV, mouth rinse, polyethylene glycol, sodium chloride flush   Vital Signs    Vitals:   06/04/22 0400 06/04/22 0500 06/04/22 0600 06/04/22 0700  BP: 122/72 129/82 (!) 149/66 (!) 157/62  Pulse: (!) 58 (!) 54 (!) 57 (!) 54  Resp: '19 18 17 18  '$ Temp: 99.1 F (37.3 C) 99 F (37.2 C) 98.8 F (37.1 C) 98.4 F (36.9 C)  TempSrc: Rectal     SpO2: 95% 96% 96% 97%  Weight:  82.5 kg    Height:        Intake/Output Summary (Last 24 hours) at 06/04/2022 0807 Last data filed at 06/04/2022 0700 Gross per 24 hour  Intake 630.01 ml  Output 800 ml  Net -169.99 ml      06/04/2022    5:00 AM 06/02/2022    3:00 PM 06/02/2022   10:57 AM  Last 3 Weights  Weight (lbs) 181 lb 14.1 oz 183 lb 6.8 oz 185 lb  Weight (kg) 82.5 kg 83.2 kg 83.915 kg      Telemetry    V paced rate of 50 bpm - Personally Reviewed  ECG    No AM tracing - Personally Reviewed  Physical Exam   GEN: No acute distress.   Neck: No JVD Cardiac: RRR, no murmurs, rubs, or gallops.  Respiratory: Clear to auscultation bilaterally. GI: Soft, nontender, non-distended  MS: Trace bilateral LE edema Neuro:  Nonfocal  Psych: Normal affect   Labs    High Sensitivity Troponin:   Recent Labs  Lab 06/02/22 1054  06/02/22 1250  TROPONINIHS 30* 35*     Chemistry Recent Labs  Lab 06/02/22 1250 06/02/22 1418 06/02/22 2234 06/03/22 0154 06/03/22 0422 06/03/22 2108 06/04/22 0440  NA 136   < >  --  135 136  --  136  K 4.5   < > 4.4 4.3 3.9  --  3.9  CL 104  --   --  104 104  --  105  CO2 26  --   --  24 24  --  23  GLUCOSE 92  --   --  102* 103*  --  109*  BUN 16  --   --  13 13  --  17  CREATININE 0.64  --   --  0.79 0.85 1.07 0.93  CALCIUM 8.2*  --   --  8.7* 8.8*  --  8.7*  MG 1.8  --  1.6*  --   --   --  1.9  PROT  --   --   --   --  5.7*  --   --   ALBUMIN  --   --   --   --  2.9*  --   --  AST  --   --   --   --  31  --   --   ALT  --   --   --   --  26  --   --   ALKPHOS  --   --   --   --  60  --   --   BILITOT  --   --   --   --  0.8  --   --   GFRNONAA >60  --   --  >60 >60 >60 >60  ANIONGAP 6  --   --  7 8  --  8   < > = values in this interval not displayed.    Lipids No results for input(s): "CHOL", "TRIG", "HDL", "LABVLDL", "LDLCALC", "CHOLHDL" in the last 168 hours.  Hematology Recent Labs  Lab 06/03/22 0154 06/03/22 0422 06/03/22 2108  WBC 6.4 6.3 8.3  RBC 3.53* 3.64* 3.61*  HGB 11.1* 11.6* 11.7*  HCT 33.2* 33.6* 35.1*  MCV 94.1 92.3 97.2  MCH 31.4 31.9 32.4  MCHC 33.4 34.5 33.3  RDW 12.7 12.6 13.1  PLT 163 156 149*   Thyroid  Recent Labs  Lab 06/02/22 1054 06/02/22 1650  TSH 6.783*  --   FREET4  --  0.99    BNPNo results for input(s): "BNP", "PROBNP" in the last 168 hours.  DDimer No results for input(s): "DDIMER" in the last 168 hours.   Radiology    CARDIAC CATHETERIZATION  Result Date: 06/03/2022 Successful TVP. PPM tomorrow per EP. Glori Bickers, MD 7:38 PM  EEG adult  Result Date: 06/02/2022 Lora Havens, MD     06/03/2022  7:56 AM Patient Name: Kenneth Bowen MRN: [id number] Epilepsy Attending: Lora Havens Referring Physician/Provider: Cristal Generous, NP Date: 04/03/2023 Duration: 22.42 mins Patient history: 89 yo M PMH  complex partial seizures who presented to ED 1/20 from assisted living after possible syncope or near syncope. EEG to evaluate for seizure. Level of alertness: Awake AEDs during EEG study: None Technical aspects: This EEG study was done with scalp electrodes positioned according to the 10-20 International system of electrode placement. Electrical activity was reviewed with band pass filter of 1-'70Hz'$ , sensitivity of 7 uV/mm, display speed of 18m/sec with a '60Hz'$  notched filter applied as appropriate. EEG data were recorded continuously and digitally stored.  Video monitoring was available and reviewed as appropriate. Description: The posterior dominant rhythm consists of 9 Hz activity of moderate voltage (25-35 uV) seen predominantly in posterior head regions, symmetric and reactive to eye opening and eye closing.  There is an excessive amount of 15 to 18 Hz beta activity distributed symmetrically and diffusely.  Hyperventilation and photic stimulation were not performed.   ABNORMALITY - Excessive beta, generalized IMPRESSION: This study is within normal limits. The excessive beta activity seen in the background is most likely due to the effect of benzodiazepine and is a benign EEG pattern. No seizures or epileptiform discharges were seen throughout the recording. A normal interictal EEG does not exclude the diagnosis of epilepsy. Priyanka OBarbra Sarks  CT ANGIO HEAD NECK W WO CM W PERF (CODE STROKE)  Result Date: 06/02/2022 CLINICAL DATA:  Neuro deficit, acute, stroke suspected pupils fixed, dilated, R gaze preference, no movement to noxious stimuli bilat EXAM: CT ANGIOGRAPHY HEAD AND NECK CT PERFUSION BRAIN TECHNIQUE: Multidetector CT imaging of the head and neck was performed using the standard protocol during bolus administration of intravenous contrast. Multiplanar CT image  reconstructions and MIPs were obtained to evaluate the vascular anatomy. Carotid stenosis measurements (when applicable) are obtained  utilizing NASCET criteria, using the distal internal carotid diameter as the denominator. Multiphase CT imaging of the brain was performed following IV bolus contrast injection. Subsequent parametric perfusion maps were calculated using RAPID software. RADIATION DOSE REDUCTION: This exam was performed according to the departmental dose-optimization program which includes automated exposure control, adjustment of the mA and/or kV according to patient size and/or use of iterative reconstruction technique. CONTRAST:  1732m OMNIPAQUE IOHEXOL 350 MG/ML SOLN COMPARISON:  Same day CT head.  CTA head/neck July 13, 2016. FINDINGS: CTA NECK FINDINGS Aortic arch: Great vessel origins are patent. Right carotid system: Atherosclerosis at the carotid bifurcation without greater than 50% stenosis. Left carotid system: Atherosclerosis at the carotid bifurcation without greater than 50% stenosis Vertebral arteries: Left dominant. Both vertebral arteries are patent without greater than 50% stenosis Skeleton: Severe multilevel degenerative change. Other neck: No acute abnormality on limited assessment. Upper chest: No consolidation the visualized lung apices. Review of the MIP images confirms the above findings CTA HEAD FINDINGS Anterior circulation: Bilateral intracranial ICAs are patent with mild for age narrowing due to atherosclerosis. Bilateral MCAs and ACAs are patent without proximal hemodynamically significant stenosis. Posterior circulation: Bilateral intradural vertebral arteries, basilar artery and bilateral posterior cerebral arteries are patent without proximal hemodynamically significant stenosis. Venous sinuses: As permitted by contrast timing, patent. Small right transverse/sigmoid sinus, similar to the prior. Review of the MIP images confirms the above findings CT Brain Perfusion Findings: ASPECTS: 10 CBF (<30%) Volume: 063mPerfusion (Tmax>6.0s) volume: 32m80mismatch Volume: 32mL232mfarction Location:None identified.  IMPRESSION: 1. No large vessel occlusion or proximal hemodynamically significant stenosis. 2. Small (5 mL) area of penumbra reported in the left posterior/inferior temporal region, possibly artifactual given streak artifact in this region. Findings discussed with Dr. StacQuinn Axe telephone at 1:24 p.m. Electronically Signed   By: FredMargaretha Sheffield.   On: 06/02/2022 13:32   CT HEAD CODE STROKE WO CONTRAST  Result Date: 06/02/2022 CLINICAL DATA:  Code stroke.  Neuro deficit, acute, stroke suspected EXAM: CT HEAD WITHOUT CONTRAST TECHNIQUE: Contiguous axial images were obtained from the base of the skull through the vertex without intravenous contrast. RADIATION DOSE REDUCTION: This exam was performed according to the departmental dose-optimization program which includes automated exposure control, adjustment of the mA and/or kV according to patient size and/or use of iterative reconstruction technique. COMPARISON:  CT head July 28, 2016. FINDINGS: Brain: No evidence of acute infarction, hemorrhage, hydrocephalus, extra-axial collection or mass lesion/mass effect. Similar patchy white matter hypodensities, compatible with chronic microvascular ischemic disease. Vascular: Calcific atherosclerosis.  No hyperdense vessel. Skull: No acute fracture. Sinuses/Orbits: Clear sinuses.  No acute orbital findings. Other: No mastoid effusions. ASPECTS ([facility] Early CT Score) Total score (0-10 with 10 being normal): 10. IMPRESSION: 1. No evidence of acute intracranial abnormality. 2. ASPECTS is 10. Code stroke imaging results were communicated on 06/02/2022 at 1:10 pm to provider STACK via secure text paging. Electronically Signed   By: FredMargaretha Sheffield.   On: 06/02/2022 13:11   DG Chest Portable 1 View  Result Date: 06/02/2022 CLINICAL DATA:  Altered mental status.  Aspiration.  Infection. EXAM: PORTABLE CHEST 1 VIEW COMPARISON:  07/21/2019 FINDINGS: Extensive artifact overlies the chest. Heart size is  normal. Mediastinal shadows appear normal. Poor inspiratory effort. Minimal patchy density at the lung bases could relate to the poor inspiration or could indicate mild basilar pneumonia.  No lobar consolidation or collapse. No pulmonary edema or pleural effusion. IMPRESSION: Poor inspiratory effort. Minimal patchy density at the lung bases could relate to the poor inspiration or could indicate mild basilar pneumonia. Electronically Signed   By: Nelson Chimes M.D.   On: 06/02/2022 11:50    Cardiac Studies     Patient Profile     89 y.o. male with history of seizures, prior DVT on Eliquis, prostate cancer admitted from assisted living after syncopal episode. He was found to be hypothermic and bradycardic. He was found to have a right lung infiltrate. He was seen on 06/03/22 by our EP team who recommended observation given sepsis. On the evening of 06/03/22 he had sinus arrest with asystole and was taken for emergent transvenous pacemaker placement.   Assessment & Plan    Sinus arrest/Asystole in the setting of possible sepsis: Transvenous pacing wire is in place. Given possible sepsis, will not plan permanent pacemaker placement today. Will leave TVP wire in place today. EP team aware. He can eat today.   For questions or updates, please contact [place] Please consult [email or web address] for contact info under     Signed, Lauree Chandler, MD  06/04/2022, 8:07 AM

## 2022-06-04 NOTE — Care Management (Signed)
  Transition of Care [facility]) Screening Note   Patient Details  Name: TAIYO KOZMA Date of Birth: 07/10/1930   Transition of Care [facility]) CM/SW Contact:    Bethena Roys, RN Phone Number: 06/04/2022, 4:47 PM    Transition of Care Department [facility]) has reviewed the patient and no TOC needs have been identified at this time. TOC will continue to monitor patient advancement through interdisciplinary progression rounds. If new patient transition needs arise, please place a TOC consult.

## 2022-06-05 ENCOUNTER — Encounter (HOSPITAL_COMMUNITY): Payer: Self-pay | Admitting: Internal Medicine

## 2022-06-05 ENCOUNTER — Inpatient Hospital Stay (HOSPITAL_COMMUNITY): Payer: Medicare Other

## 2022-06-05 DIAGNOSIS — R5381 Other malaise: Secondary | ICD-10-CM

## 2022-06-05 DIAGNOSIS — J9601 Acute respiratory failure with hypoxia: Secondary | ICD-10-CM

## 2022-06-05 DIAGNOSIS — I442 Atrioventricular block, complete: Secondary | ICD-10-CM | POA: Diagnosis not present

## 2022-06-05 LAB — CBC WITH DIFFERENTIAL/PLATELET
Abs Immature Granulocytes: 0.11 10*3/uL — ABNORMAL HIGH (ref 0.00–0.07)
Basophils Absolute: 0.1 10*3/uL (ref 0.0–0.1)
Basophils Relative: 1 %
Eosinophils Absolute: 0 10*3/uL (ref 0.0–0.5)
Eosinophils Relative: 1 %
HCT: 32.1 % — ABNORMAL LOW (ref 39.0–52.0)
Hemoglobin: 11 g/dL — ABNORMAL LOW (ref 13.0–17.0)
Immature Granulocytes: 1 %
Lymphocytes Relative: 8 %
Lymphs Abs: 0.7 10*3/uL (ref 0.7–4.0)
MCH: 32.6 pg (ref 26.0–34.0)
MCHC: 34.3 g/dL (ref 30.0–36.0)
MCV: 95.3 fL (ref 80.0–100.0)
Monocytes Absolute: 1 10*3/uL (ref 0.1–1.0)
Monocytes Relative: 12 %
Neutro Abs: 6.8 10*3/uL (ref 1.7–7.7)
Neutrophils Relative %: 77 %
Platelets: 147 10*3/uL — ABNORMAL LOW (ref 150–400)
RBC: 3.37 MIL/uL — ABNORMAL LOW (ref 4.22–5.81)
RDW: 13.2 % (ref 11.5–15.5)
WBC: 8.8 10*3/uL (ref 4.0–10.5)
nRBC: 0 % (ref 0.0–0.2)

## 2022-06-05 LAB — BASIC METABOLIC PANEL
Anion gap: 7 (ref 5–15)
BUN: 18 mg/dL (ref 8–23)
CO2: 26 mmol/L (ref 22–32)
Calcium: 8.6 mg/dL — ABNORMAL LOW (ref 8.9–10.3)
Chloride: 106 mmol/L (ref 98–111)
Creatinine, Ser: 0.86 mg/dL (ref 0.61–1.24)
GFR, Estimated: >60 mL/min (ref >60)
Glucose, Bld: 82 mg/dL (ref 70–99)
Potassium: 3.5 mmol/L (ref 3.5–5.1)
Sodium: 139 mmol/L (ref 135–145)

## 2022-06-05 LAB — MAGNESIUM: Magnesium: 1.7 mg/dL (ref 1.7–2.4)

## 2022-06-05 MED ORDER — MAGNESIUM SULFATE 2 GM/50ML IV SOLN
2.0000 g | Freq: Once | INTRAVENOUS | Status: AC
  Administered 2022-06-05: 2 g via INTRAVENOUS
  Filled 2022-06-05: qty 50

## 2022-06-05 MED ORDER — POTASSIUM CHLORIDE CRYS ER 20 MEQ PO TBCR
40.0000 meq | EXTENDED_RELEASE_TABLET | Freq: Once | ORAL | Status: AC
  Administered 2022-06-05: 40 meq via ORAL
  Filled 2022-06-05: qty 2

## 2022-06-05 MED FILL — Lidocaine HCl Local Inj 1%: INTRAMUSCULAR | Qty: 60 | Status: AC

## 2022-06-05 NOTE — Progress Notes (Signed)
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 On EMS arrival he was very bradycardic, requiring atropine x3. Noted to be hypothermic in ED with a rectal temp 86, as well as confused, HR 40s. Initial labs with a WBC 2.3.  Started on abx, bair hugger, warmed fluids w concern for sepsis + symptomatic bradycardia r/t hypothermia.  PCCM consulted in this setting    In ED he was unresponsive on my arrival. Pupils are dilated and fixed. ?surgical appearance of L, but no confirmed history of this at time of my exam.  Code stroke initiated    Pertinent  Medical History  Complex partial seizures RLS DVT on eliquis   Significant Hospital Events: Including procedures, antibiotic start and stop dates in addition to other pertinent events   1/20 ED with AMS,?syncope vs near syncope, hypothermia bradycardia. Code stroke  1/21 bradycardic arrest with ROSC <1 minute after cpr initiated; PPM placed 1/22 remains on 5 mcs dopamine 1/23 off dopamine  Interim History / Subjective:  Off dopamine this am Bp stable Rate paced 60s Patient with no complaints  Objective   Blood pressure (!) 106/52, pulse 65, temperature 97.9 F (36.6 C), temperature source Oral, resp. rate 17, height '5\' 11"'$  (1.803 m), weight 82.5 kg, SpO2 93 %.        Intake/Output Summary (Last 24 hours) at 06/05/2022 0902 Last data filed at 06/05/2022 0800 Gross per 24 hour  Intake 308.53 ml  Output 1020 ml  Net -711.47 ml    Filed Weights   06/02/22 1057 06/02/22 1500 06/04/22 0500  Weight: 83.9 kg 83.2 kg 82.5 kg    Examination: General:  elderly male in NAD HEENT: MM pink/moist;  [place] in place Neuro: Aox3; MAE CV: s1s2, paced 60s, no m/r/g PULM:  dim clear BS bilaterally;  2L GI: soft, bsx4 active  Extremities: warm/dry, no edema  Skin: no rashes or lesions    Resolved Hospital Problem list     Assessment & Plan:  Severe symptomatic bradycardia due to high-grade AV block -Patient had bradycardia arrest x 3 now -PPM placed 1/21 Plan: -EP following -weaned off dopamine -PPM in place -telemetry monitoring -K/mag repleted this am; trend electrolytes and replete as needed  Hypothermia likely due to cold exposure Sepsis was ruled out After patient pneumonitis -Patient presented with core temperature of 86 F; WBC 2.3 Plan: -WBC/fever wnl; trend -on ancef for surgical ppx -cultures negative  Hypokalemia Hypomagnesemia Plan: -K/mag repleted this am -trend bmp/mag; replete electrolytes as needed  Acute encephalopathy in the setting of severe symptomatic bradycardia Complex partial seizure -CT head, CTA head and neck was negative for acute stroke -EEG negative for acute seizure Plan: -improved -limit sedating meds -continue valproic acid and topirmate  Unprovoked DVT Plan: -Per EP can resume eliquis 1/28 -cont scd's   Best Practice (right click and "Reselect all SmartList Selections" daily)   Diet/type: Regular diet DVT prophylaxis: SCD GI prophylaxis: N/A Lines: N/A Foley:  N/A Code Status:  full code    1/23 updated patient at bedside  Labs   CBC: Recent Labs  Lab 06/02/22 1054 06/02/22 1418 06/03/22 0154 06/03/22 0422 06/03/22  2108 06/05/22 0502  WBC 2.3*  --  6.4 6.3 8.3 8.8  NEUTROABS  --   --   --  5.3  --  6.8  HGB 10.9* 11.6* 11.1* 11.6* 11.7* 11.0*  HCT 33.1* 34.0* 33.2* 33.6* 35.1* 32.1*  MCV 97.9  --  94.1 92.3 97.2 95.3  PLT 130*  --  163 156 149* 147*     Basic Metabolic Panel: Recent Labs  Lab 06/02/22 1250 06/02/22 1418 06/02/22 2234 06/03/22 0154 06/03/22 0422 06/03/22 2108 06/04/22 0440  06/05/22 0502  NA 136 138  --  135 136  --  136 139  K 4.5 3.5 4.4 4.3 3.9  --  3.9 3.5  CL 104  --   --  104 104  --  105 106  CO2 26  --   --  24 24  --  23 26  GLUCOSE 92  --   --  102* 103*  --  109* 82  BUN 16  --   --  13 13  --  17 18  CREATININE 0.64  --   --  0.79 0.85 1.07 0.93 0.86  CALCIUM 8.2*  --   --  8.7* 8.8*  --  8.7* 8.6*  MG 1.8  --  1.6*  --   --   --  1.9 1.7    GFR: Estimated Creatinine Clearance: 62 mL/min (by C-G formula based on SCr of 0.86 mg/dL). Recent Labs  Lab 06/02/22 1131 06/02/22 1348 06/02/22 1650 06/03/22 0154 06/03/22 0422 06/03/22 2108 06/05/22 0502  PROCALCITON  --   --  <0.10  --   --   --   --   WBC  --   --   --  6.4 6.3 8.3 8.8  LATICACIDVEN 0.8 0.6  --   --   --   --   --      Liver Function Tests: Recent Labs  Lab 06/03/22 0422  AST 31  ALT 26  ALKPHOS 60  BILITOT 0.8  PROT 5.7*  ALBUMIN 2.9*    No results for input(s): "LIPASE", "AMYLASE" in the last 168 hours. Recent Labs  Lab 06/02/22 1650 06/03/22 0422  AMMONIA 43* 42*     ABG    Component Value Date/Time   PHART 7.351 06/02/2022 1418   PCO2ART 41.5 06/02/2022 1418   PO2ART 45 (L) 06/02/2022 1418   HCO3 24.5 06/02/2022 1418   TCO2 26 06/02/2022 1418   ACIDBASEDEF 3.0 (H) 06/02/2022 1418   O2SAT 89 06/02/2022 1418     Coagulation Profile: Recent Labs  Lab 06/02/22 1650  INR 1.1     Cardiac Enzymes: No results for input(s): "CKTOTAL", "CKMB", "CKMBINDEX", "TROPONINI" in the last 168 hours.  HbA1C: Hgb A1c MFr Bld  Date/Time Value Ref Range Status  07/13/2016 02:31 AM 5.8 (H) 4.8 - 5.6 % Final    Comment:    (NOTE)         Pre-diabetes: 5.7 - 6.4         Diabetes: >6.4         Glycemic control for adults with diabetes: <7.0     CBG: Recent Labs  Lab 06/03/22 1952  GLUCAP [place], PA-C [facility] 06/05/2022, 9:22 AM  Please see [email or web address] for pager details.  From 7A-7P if no response, please  call [phone]. After hours, please call [facility] [phone].

## 2022-06-05 NOTE — Discharge Instructions (Signed)
     Supplemental Discharge Instructions for  Pacemaker/Defibrillator Patients   Activity No heavy lifting or vigorous activity with your left/right arm for 6 to 8 weeks.  Do not raise your left/right arm above your head for one week.  Gradually raise your affected arm as drawn below.              06/09/22                   06/10/22                      06/11/22                 06/12/22 __  NO DRIVING for   1 week  ; you may begin driving on  6/96/78   .  WOUND CARE Keep the wound area clean and dry.  Do not get this area wet , no showers for one week; you may shower on  06/12/22  . The tape/steri-strips on your wound will fall off; do not pull them off.  No bandage is needed on the site.  DO  NOT apply any creams, oils, or ointments to the wound area. If you notice any drainage or discharge from the wound, any swelling or bruising at the site, or you develop a fever > 101? F after you are discharged home, call the office at once.  Special Instructions You are still able to use cellular telephones; use the ear opposite the side where you have your pacemaker/defibrillator.  Avoid carrying your cellular phone near your device. When traveling through airports, show security personnel your identification card to avoid being screened in the metal detectors.  Ask the security personnel to use the hand wand. Avoid arc welding equipment, MRI testing (magnetic resonance imaging), TENS units (transcutaneous nerve stimulators).  Call the office for questions about other devices. Avoid electrical appliances that are in poor condition or are not properly grounded. Microwave ovens are safe to be near or to operate.

## 2022-06-05 NOTE — Progress Notes (Signed)
[facility] ADULT ICU REPLACEMENT PROTOCOL   The patient does apply for the [facility] Adult ICU Electrolyte Replacment Protocol based on the criteria listed below:   1.Exclusion criteria: TCTS, ECMO, Dialysis, and Myasthenia Gravis patients 2. Is GFR >/= 30 ml/min? Yes.    Patient's GFR today is >60 3. Is SCr </= 2? Yes.   Patient's SCr is 0.86 mg/dL 4. Did SCr increase >/= 0.5 in 24 hours? No. 5.[name] weight >40kg  Yes.   6. Abnormal electrolyte(s): potassium 3.5, mag 1.7  7. Electrolytes replaced per protocol 8.  Call MD STAT for K+ </= 2.5, Phos </= 1, or Mag </= 1 Physician:  Theador Hawthorne 06/05/2022 6:41 AM

## 2022-06-05 NOTE — Progress Notes (Addendum)
Rounding Note    Patient Name: Kenneth Bowen Date of Encounter: 06/05/2022  [facility] Cardiologist: None  Subjective   No CP, denies SOB, no site pain  Inpatient Medications    Scheduled Meds:  Chlorhexidine Gluconate Cloth  6 each Topical Q0600   divalproex  500 mg Oral Daily   docusate sodium  100 mg Oral BID   leptospermum manuka honey  1 Application Topical Daily   mupirocin ointment  1 Application Nasal BID   polyethylene glycol  17 g Oral Daily   sodium chloride flush  3 mL Intravenous Q12H   sodium chloride flush  3 mL Intravenous Q12H   topiramate  50 mg Oral QHS   Continuous Infusions:  sodium chloride      ceFAZolin (ANCEF) IV Stopped (06/05/22 0615)   PRN Meds: sodium chloride, acetaminophen, docusate sodium, Gerhardt's butt cream, ondansetron (ZOFRAN) IV, mouth rinse, polyethylene glycol, sodium chloride flush   Vital Signs    Vitals:   06/05/22 0600 06/05/22 0700 06/05/22 0800 06/05/22 0807  BP: 132/79 114/82 (!) 106/52   Pulse: 86 68 65   Resp: (!) '24 16 17   '$ Temp:    97.9 F (36.6 C)  TempSrc:    Oral  SpO2: 95% 95% 93%   Weight:      Height:        Intake/Output Summary (Last 24 hours) at 06/05/2022 0847 Last data filed at 06/05/2022 0800 Gross per 24 hour  Intake 317.68 ml  Output 1020 ml  Net -702.32 ml      06/04/2022    5:00 AM 06/02/2022    3:00 PM 06/02/2022   10:57 AM  Last 3 Weights  Weight (lbs) 181 lb 14.1 oz 183 lb 6.8 oz 185 lb  Weight (kg) 82.5 kg 83.2 kg 83.915 kg      Telemetry    SR/ Vpacing - Personally Reviewed  ECG    SR, V paced, blocked PACs - Personally Reviewed  Physical Exam   GEN: No acute distress.   Neck: No JVD Cardiac: RRR, no murmurs, rubs, or gallops.  Respiratory: CTA b/l. GI: Soft, nontender, non-distended  MS: No edema; No deformity. Neuro:  Nonfocal  Psych: Normal affect   Site is stable, dressing CDI, no hematoma  Labs    High Sensitivity Troponin:   Recent Labs   Lab 06/02/22 1054 06/02/22 1250  TROPONINIHS 30* 35*     Chemistry Recent Labs  Lab 06/02/22 2234 06/03/22 0154 06/03/22 0422 06/03/22 2108 06/04/22 0440 06/05/22 0502  NA  --    < > 136  --  136 139  K 4.4   < > 3.9  --  3.9 3.5  CL  --    < > 104  --  105 106  CO2  --    < > 24  --  23 26  GLUCOSE  --    < > 103*  --  109* 82  BUN  --    < > 13  --  17 18  CREATININE  --    < > 0.85 1.07 0.93 0.86  CALCIUM  --    < > 8.8*  --  8.7* 8.6*  MG 1.6*  --   --   --  1.9 1.7  PROT  --   --  5.7*  --   --   --   ALBUMIN  --   --  2.9*  --   --   --  AST  --   --  31  --   --   --   ALT  --   --  26  --   --   --   ALKPHOS  --   --  60  --   --   --   BILITOT  --   --  0.8  --   --   --   GFRNONAA  --    < > >60 >60 >60 >60  ANIONGAP  --    < > 8  --  8 7   < > = values in this interval not displayed.    Lipids No results for input(s): "CHOL", "TRIG", "HDL", "LABVLDL", "LDLCALC", "CHOLHDL" in the last 168 hours.  Hematology Recent Labs  Lab 06/03/22 0422 06/03/22 2108 06/05/22 0502  WBC 6.3 8.3 8.8  RBC 3.64* 3.61* 3.37*  HGB 11.6* 11.7* 11.0*  HCT 33.6* 35.1* 32.1*  MCV 92.3 97.2 95.3  MCH 31.9 32.4 32.6  MCHC 34.5 33.3 34.3  RDW 12.6 13.1 13.2  PLT 156 149* 147*   Thyroid  Recent Labs  Lab 06/02/22 1054 06/02/22 1650  TSH 6.783*  --   FREET4  --  0.99    BNPNo results for input(s): "BNP", "PROBNP" in the last 168 hours.  DDimer No results for input(s): "DDIMER" in the last 168 hours.   Radiology    05/06/23: CXR IMPRESSION: Dual lead pacemaker placed from a left subclavian approach. No pneumothorax.  Cardiac Studies   08/04/21: TTE 1. Left ventricular ejection fraction, by estimation, is 60 to 65%. The  left ventricle has normal function. The left ventricle has no regional  wall motion abnormalities. Left ventricular diastolic parameters are  indeterminate.   2. Right ventricular systolic function is normal. The right ventricular  size is  mildly enlarged. Tricuspid regurgitation signal is inadequate for  assessing PA pressure.   3. The mitral valve is grossly normal. Trivial mitral valve  regurgitation.   4. The aortic valve is grossly normal. There is moderate calcification of  the aortic valve. Aortic valve regurgitation is not visualized.   5. The inferior vena cava is normal in size with greater than 50%  respiratory variability, suggesting right atrial pressure of 3 mmHg.   Comparison(s): No prior Echocardiogram.   Patient Profile     89 y.o. male w/PMHx of seizure d/o, DVT (on eliquis at home), RLS, prostate Ca admitted 06/02/22 after a syncopal event found bradycardic, given atropine, also noted hypothermic with concerns of possible sepsis, acute encephalopathy  Code stroke called with development of becoming unresponsive with fixed pupils Neuro consulted CT imaging is negative for acute intracranial abnormality and vessel imaging is negative for LVO or hemodynamically significant stenosis. Examination fluctuates with patient responsiveness, command following, but patient's pupils have remained dilated and nonresponsive throughout their consult/exam Planned for MRI, EEG (was normal) ?if 2/2 sepsis Wound care RN for b/l elbow wounds b/l LE wounds as well >> s/o same day  Started on dopamine for his bradycardia since his admission  EP consulted yesterday remained bradycardic but improved, unclear etiology of his hypothermia, ? sepsis Later in the day developed asystole w/LOC and emergent tamp wire placed  He tells me he has had a couple falls 2/2 to a parkinson-like problem, but never syncope before now  Assessment & Plan    Syncope Symptomatic bradycardia, sinus arrest  S/p PPM implant yesterday Site is stable Device check this AM with stable measurements CXR this  AM with no PTX Activity restrictions discussed  OK to mobilize following LUE restrictions OK to transfer out of ICU Dr. Lovena Le has seen the  patient this AM Anticipate home tomorrow from our perspective  Hypothermia PNA MSSA screen negative and Abx >> zosyn No leukocytosis Lactc acid 0.6 Procal <0.10 D/w CCM MD yesterday, not felt to be infected/septic  5. DVT by history No eliquis here SCD's Plan to resume [place] SUNDAY 06/10/22   For questions or updates, please contact Jahmire Ruffins [place] Please consult [email or web address] for contact info under     Signed, Baldwin Jamaica, PA-C  06/05/2022, 8:47 AM    EP Attending  Patient seen and examined. Agree with above. The patient is doing well after DDD PM insertion with a left bundle area lead. Interrogation under my direction demonstrates normal DDD PM function. CXR looks good with no PTX. He can be discharged from EP perspective with usual followup.   Carleene Overlie Ardel Jagger,MD

## 2022-06-05 NOTE — Evaluation (Signed)
Physical Therapy Evaluation Patient Details Name: Kenneth Bowen MRN: [id number] DOB: 1930-11-01 Today's Date: 06/05/2022  History of Present Illness  89 yo male presents to [facility] on 1/20 from ALF with fall due to syncopal episode, bradycardia, encephalopathy. Period of unresponsiveness with bradycardia in ED, dopamine initiated, period of asystole with return of HR after CPR compressions initiated. Temporary pacer on 1/21, s/p PPM 1/22. PMH includes complex partial seizures, prostate cancer, RLS, DVT.  Clinical Impression   Pt presents with generalized weakness, decreased knowledge and application of pacemaker precautions, impaired balance with history of falls in posterior direction, shuffling gait,  and decreased activity tolerance. Pt to benefit from acute PT to address deficits. Pt ambulated hallway distance with use of rollator and close guard to light physical assist. Pt states he is able to get increased support at ALF, would recommend this (see below). PT to progress mobility as tolerated, and will continue to follow acutely.         Recommendations for follow up therapy are one component of a multi-disciplinary discharge planning process, led by the attending physician.  Recommendations may be updated based on patient status, additional functional criteria and insurance authorization.  Follow Up Recommendations Home health PT (and increased support from aides for mobility and ADLs, per pt he is able to hire help)      Assistance Recommended at Discharge Frequent or constant Supervision/Assistance  Patient can return home with the following  A little help with walking and/or transfers;A little help with bathing/dressing/bathroom;Assistance with cooking/housework;Direct supervision/assist for medications management;Help with stairs or ramp for entrance;Direct supervision/assist for financial management;Assist for transportation    Equipment Recommendations None recommended by PT   Recommendations for Other Services       Functional Status Assessment Patient has had a recent decline in their functional status and demonstrates the ability to make significant improvements in function in a reasonable and predictable amount of time.     Precautions / Restrictions Precautions Precautions: Fall Restrictions LUE Weight Bearing:  (pacemaker restrictions on LUE - handout administered)      Mobility  Bed Mobility               General bed mobility comments: up in chair    Transfers Overall transfer level: Needs assistance Equipment used: Rollator (4 wheels) Transfers: Sit to/from Stand Sit to Stand: Min assist           General transfer comment: assist for power up, rise, steadying. max cues for correct L hand placement to prevent pushing/pulling with LUE. STS x3, from chair each time. Pt with posterior bias requiring PT intervention to correct    Ambulation/Gait Ambulation/Gait assistance: Min guard Gait Distance (Feet): 80 Feet Assistive device: Rollator (4 wheels) Gait Pattern/deviations: Step-through pattern, Decreased stride length, Trunk flexed Gait velocity: decr     General Gait Details: cues for upright posture, placement close to rollator. DOE 2/4, SPO2 96% post-gait on RA. HR max observed 93 bpm  Stairs            Wheelchair Mobility    Modified Rankin (Stroke Patients Only)       Balance Overall balance assessment: Needs assistance, History of Falls Sitting-balance support: Feet supported, No upper extremity supported Sitting balance-Leahy Scale: Fair   Postural control: Posterior lean Standing balance support: Bilateral upper extremity supported, During functional activity, Reliant on assistive device for balance Standing balance-Leahy Scale: Poor  Pertinent Vitals/Pain Pain Assessment Pain Assessment: Faces Faces Pain Scale: No hurt Pain Intervention(s): Monitored during  session    Home Living Family/patient expects to be discharged to:: Assisted living                 Home Equipment: Rollator (4 wheels);Other (comment);Shower seat;Grab bars - tub/shower;Grab bars - toilet      Prior Function Prior Level of Function : Needs assist;History of Falls (last six months)             Mobility Comments: pt reports using a 3 wheeled rollator for mobility, states he has "parkinson's features" with no formal diagnosis of parkinson's. Pt reports a history of falling backwards       Hand Dominance   Dominant Hand: Right    Extremity/Trunk Assessment   Upper Extremity Assessment Upper Extremity Assessment: Defer to OT evaluation    Lower Extremity Assessment Lower Extremity Assessment: Generalized weakness    Cervical / Trunk Assessment Cervical / Trunk Assessment: Kyphotic  Communication   Communication: No difficulties  Cognition Arousal/Alertness: Awake/alert Behavior During Therapy: WFL for tasks assessed/performed, Flat affect Overall Cognitive Status: No family/caregiver present to determine baseline cognitive functioning Area of Impairment: Attention, Following commands, Memory, Safety/judgement, Problem solving                     Memory: Decreased recall of precautions Following Commands: Follows one step commands with increased time Safety/Judgement: Decreased awareness of deficits, Decreased awareness of safety   Problem Solving: Difficulty sequencing, Requires tactile cues, Requires verbal cues General Comments: A&Ox3 (time not tested), but periods of off-the-wall comments for instance "will you tie my shoes? they need to be tied" when pt is only wearing socks. Pt requiring frequent cues for safety with LUE s/p pacemaker        General Comments      Exercises     Assessment/Plan    PT Assessment Patient needs continued PT services  PT Problem List Decreased strength;Decreased mobility;Decreased activity  tolerance;Decreased balance;Decreased knowledge of use of DME;Decreased safety awareness;Cardiopulmonary status limiting activity       PT Treatment Interventions DME instruction;Therapeutic activities;Gait training;Therapeutic exercise;Patient/family education;Balance training;Stair training;Functional mobility training;Neuromuscular re-education    PT Goals (Current goals can be found in the Care Plan section)  Acute Rehab PT Goals PT Goal Formulation: With patient Time For Goal Achievement: 06/19/22 Potential to Achieve Goals: Good    Frequency Min 3X/week     Co-evaluation               AM-PAC PT "6 Clicks" Mobility  Outcome Measure Help needed turning from your back to your side while in a flat bed without using bedrails?: A Little Help needed moving from lying on your back to sitting on the side of a flat bed without using bedrails?: A Little Help needed moving to and from a bed to a chair (including a wheelchair)?: A Little Help needed standing up from a chair using your arms (e.g., wheelchair or bedside chair)?: A Little Help needed to walk in hospital room?: A Little Help needed climbing 3-5 steps with a railing? : A Little 6 Click Score: 18    End of Session Equipment Utilized During Treatment: Gait belt Activity Tolerance: Patient tolerated treatment well Patient left: in chair;with call bell/phone within reach;with chair alarm set;with nursing/sitter in room Nurse Communication: Mobility status PT Visit Diagnosis: Other abnormalities of gait and mobility (R26.89);History of falling (Z91.81);Difficulty in walking, not elsewhere classified (R26.2)  Time: 1333-1410 (10 minutes not included in chargeable time while PT went to get rollator) PT Time Calculation (min) (ACUTE ONLY): 37 min   Charges:   PT Evaluation $PT Eval Low Complexity: 1 Low PT Treatments $Therapeutic Activity: 8-22 mins        Stacie Glaze, PT DPT Acute Rehabilitation Services Pager  [phone]  Office [phone]   Louis Matte 06/05/2022, 4:34 PM

## 2022-06-06 DIAGNOSIS — I442 Atrioventricular block, complete: Secondary | ICD-10-CM | POA: Diagnosis not present

## 2022-06-06 DIAGNOSIS — R5381 Other malaise: Secondary | ICD-10-CM | POA: Diagnosis not present

## 2022-06-06 DIAGNOSIS — R001 Bradycardia, unspecified: Secondary | ICD-10-CM | POA: Diagnosis not present

## 2022-06-06 DIAGNOSIS — G9340 Encephalopathy, unspecified: Secondary | ICD-10-CM | POA: Diagnosis not present

## 2022-06-06 LAB — CBC
HCT: 33.2 % — ABNORMAL LOW (ref 39.0–52.0)
Hemoglobin: 11.3 g/dL — ABNORMAL LOW (ref 13.0–17.0)
MCH: 32.2 pg (ref 26.0–34.0)
MCHC: 34 g/dL (ref 30.0–36.0)
MCV: 94.6 fL (ref 80.0–100.0)
Platelets: 143 10*3/uL — ABNORMAL LOW (ref 150–400)
RBC: 3.51 MIL/uL — ABNORMAL LOW (ref 4.22–5.81)
RDW: 13.1 % (ref 11.5–15.5)
WBC: 9.6 10*3/uL (ref 4.0–10.5)
nRBC: 0 % (ref 0.0–0.2)

## 2022-06-06 LAB — BASIC METABOLIC PANEL
Anion gap: 9 (ref 5–15)
BUN: 18 mg/dL (ref 8–23)
CO2: 25 mmol/L (ref 22–32)
Calcium: 8.6 mg/dL — ABNORMAL LOW (ref 8.9–10.3)
Chloride: 104 mmol/L (ref 98–111)
Creatinine, Ser: 0.83 mg/dL (ref 0.61–1.24)
GFR, Estimated: >60 mL/min (ref >60)
Glucose, Bld: 100 mg/dL — ABNORMAL HIGH (ref 70–99)
Potassium: 3.8 mmol/L (ref 3.5–5.1)
Sodium: 138 mmol/L (ref 135–145)

## 2022-06-06 LAB — MAGNESIUM: Magnesium: 2 mg/dL (ref 1.7–2.4)

## 2022-06-06 MED ORDER — HALOPERIDOL LACTATE 5 MG/ML IJ SOLN
1.0000 mg | Freq: Four times a day (QID) | INTRAMUSCULAR | Status: DC | PRN
  Administered 2022-06-06: 1 mg via INTRAVENOUS
  Filled 2022-06-06: qty 1

## 2022-06-06 NOTE — Evaluation (Signed)
Occupational Therapy Evaluation Patient Details Name: Kenneth Bowen MRN: [id number] DOB: [birth year] Today's Date: 06/06/2022   History of Present Illness 89 yo male presents to [facility] on 1/20 from ALF with fall due to syncopal episode, bradycardia, encephalopathy. Period of unresponsiveness with bradycardia in ED, dopamine initiated, period of asystole with return of HR after CPR compressions initiated. Temporary pacer on 1/21, s/p PPM 1/22. PMH includes complex partial seizures, prostate cancer, RLS, DVT.   Clinical Impression   Patient with significant change in presentation from PT evaluation on 06/05/22. Patient found with legs hanging over the bed, asking to be assisted to the dining hall, disoriented and minimally agitated, was not able to reorient, resistive to movement when attempting OOB movement with NT there to assist, set up in chair position with NT present to promote self feeding. Patient is currently oriented to self, month, and year only. Patient requiring mod A for all ADLs, and mod A of 2 to attempt any OOB movement. Transfers deferred due to patient safety due to increased resistive movements and inability to follow commands. Care team notified of change of recommendation to SNF due to current level of function. OT will continue to follow.      Recommendations for follow up therapy are one component of a multi-disciplinary discharge planning process, led by the attending physician.  Recommendations may be updated based on patient status, additional functional criteria and insurance authorization.   Follow Up Recommendations  Skilled nursing-short term rehab (<3 hours/day)     Assistance Recommended at Discharge Frequent or constant Supervision/Assistance  Patient can return home with the following Two people to help with walking and/or transfers;A lot of help with bathing/dressing/bathroom;Assistance with cooking/housework;Direct supervision/assist for medications  management;Direct supervision/assist for financial management;Assist for transportation;Help with stairs or ramp for entrance;Assistance with feeding    Functional Status Assessment  Patient has had a recent decline in their functional status and demonstrates the ability to make significant improvements in function in a reasonable and predictable amount of time.  Equipment Recommendations  Other (comment) (Defer to next venue)    Recommendations for Other Services       Precautions / Restrictions Precautions Precautions: Fall Precaution Comments: Pacemaker precautions LUE Restrictions Weight Bearing Restrictions: Yes LUE Weight Bearing:  (pacemaker restrictions on LUE - handout administered) LUE Partial Weight Bearing Percentage or Pounds: <5 lbs      Mobility Bed Mobility Overal bed mobility: Needs Assistance Bed Mobility: Rolling Rolling: Mod assist, +2 for physical assistance, +2 for safety/equipment         General bed mobility comments: patient with both legs over the EOB, requiring significant assist from NT and OT to reposition in bed, resistive to all movement    Transfers                   General transfer comment: deferred due to safety      Balance Overall balance assessment: Needs assistance, History of Falls                                         ADL either performed or assessed with clinical judgement   ADL Overall ADL's : Needs assistance/impaired Eating/Feeding: Minimal assistance;Bed level   Grooming: Minimal assistance;Sitting   Upper Body Bathing: Moderate assistance;Bed level   Lower Body Bathing: Maximal assistance;Total assistance;Bed level   Upper Body Dressing : Minimal assistance;Bed level  Lower Body Dressing: Maximal assistance;Total assistance;Bed level               Functional mobility during ADLs: Moderate assistance;+2 for physical assistance;+2 for safety/equipment General ADL Comments: Patient  with significant change from PT eval yesterday, needing increased assist for all aspects of care, unable to follow any commands, resistive to assist     Vision Baseline Vision/History: 1 Wears glasses Ability to See in Adequate Light: 0 Adequate Patient Visual Report: No change from baseline (patient unable to answer)       Perception     Praxis      Pertinent Vitals/Pain Pain Assessment Pain Assessment: Faces Faces Pain Scale: Hurts a little bit Pain Location: generalized with movement, unable to state where Pain Descriptors / Indicators: Discomfort, Grimacing, Guarding Pain Intervention(s): Limited activity within patient's tolerance, Repositioned, Monitored during session     Hand Dominance Right   Extremity/Trunk Assessment Upper Extremity Assessment Upper Extremity Assessment: Generalized weakness;LUE deficits/detail LUE Deficits / Details: attempted to rehearse LUE pacemaker precautions to no avail, frequently pushing and pulling with LUE   Lower Extremity Assessment Lower Extremity Assessment: Defer to PT evaluation   Cervical / Trunk Assessment Cervical / Trunk Assessment: Kyphotic   Communication Communication Communication: Expressive difficulties (disoriented)   Cognition Arousal/Alertness: Awake/alert Behavior During Therapy: Anxious, Impulsive, Agitated, Restless Overall Cognitive Status: No family/caregiver present to determine baseline cognitive functioning Area of Impairment: Orientation, Attention, Memory, Following commands, Safety/judgement, Awareness, Problem solving                 Orientation Level: Situation, Place Current Attention Level: Focused Memory: Decreased recall of precautions, Decreased short-term memory Following Commands: Follows one step commands inconsistently Safety/Judgement: Decreased awareness of safety, Decreased awareness of deficits Awareness: Anticipatory Problem Solving: Slow processing, Decreased initiation,  Difficulty sequencing, Requires tactile cues, Requires verbal cues General Comments: Found with legs hanging over the bed, asking to be assisted to the dining hall, disoriented and minimally agitated, was not able to reorient, resistive to movement when attempting OOB movement with NT there to assist, set up in chair position with NT present to promote self feeding.     General Comments  HR 76-79 BPM throughout    Exercises     Shoulder Instructions      Home Living Family/patient expects to be discharged to:: Assisted living                             Home Equipment: Rollator (4 wheels);Other (comment);Shower seat;Grab bars - tub/shower;Grab bars - toilet          Prior Functioning/Environment Prior Level of Function : Needs assist;History of Falls (last six months)             Mobility Comments: pt reports using a 3 wheeled rollator for mobility, states he has "parkinson's features" with no formal diagnosis of parkinson's. Pt reports a history of falling backwards ADLs Comments: Information pulled from PT evaluation, patient disoriented and minimally resistive/combative with mits on, found with legs hanging out of the bed, per chart review patient is able to complete ADLs however wife has reported recent decline over the last month        OT Problem List: Decreased activity tolerance;Decreased range of motion;Decreased strength;Impaired balance (sitting and/or standing);Decreased coordination;Decreased cognition;Decreased safety awareness;Decreased knowledge of use of DME or AE;Decreased knowledge of precautions      OT Treatment/Interventions: Therapeutic exercise;Self-care/ADL training;Energy conservation;DME and/or AE instruction;Manual therapy;Cognitive  remediation/compensation;Patient/family education;Balance training;Therapeutic activities    OT Goals(Current goals can be found in the care plan section) Acute Rehab OT Goals Patient Stated Goal: patient  unable OT Goal Formulation: Patient unable to participate in goal setting Time For Goal Achievement: 06/20/22 Potential to Achieve Goals: Fair ADL Goals Pt Will Perform Lower Body Bathing: with mod assist;sitting/lateral leans;sit to/from stand Pt Will Perform Lower Body Dressing: with mod assist;sit to/from stand;sitting/lateral leans Pt Will Transfer to Toilet: with mod assist;ambulating Pt Will Perform Toileting - Clothing Manipulation and hygiene: with mod assist;sitting/lateral leans;sit to/from stand Additional ADL Goal #1: Patient will be able to follow 1-2 step commands consistently in order to participate in ADL activities safely.  OT Frequency: Min 2X/week    Co-evaluation              AM-PAC OT "6 Clicks" Daily Activity     Outcome Measure Help from another person eating meals?: A Little Help from another person taking care of personal grooming?: A Little Help from another person toileting, which includes using toliet, bedpan, or urinal?: A Lot Help from another person bathing (including washing, rinsing, drying)?: A Lot Help from another person to put on and taking off regular upper body clothing?: A Little Help from another person to put on and taking off regular lower body clothing?: A Lot 6 Click Score: 15   End of Session Nurse Communication: Mobility status;Other (comment) (recommendation downgraded to SNF)  Activity Tolerance: Treatment limited secondary to agitation Patient left: in bed;with call bell/phone within reach (chair position with NT present)  OT Visit Diagnosis: Unsteadiness on feet (R26.81);Other abnormalities of gait and mobility (R26.89);Repeated falls (R29.6);Muscle weakness (generalized) (M62.81);History of falling (Z91.81);Other symptoms and signs involving cognitive function;Adult, failure to thrive (R62.7)                Time: 0370-4888 OT Time Calculation (min): 17 min Charges:  OT General Charges $OT Visit: 1 Visit OT Evaluation $OT  Eval Moderate Complexity: 1 Mod  [place]. Jakwon Gayton, OTR/L Acute Rehabilitation Services [phone]   Ascencion Dike 06/06/2022, 9:06 AM

## 2022-06-06 NOTE — Progress Notes (Signed)
Physical Therapy Treatment Patient Details Name: Kenneth Bowen MRN: [id number] DOB: [birth year] Today's Date: 06/06/2022   History of Present Illness 89 yo male presents to [facility] on 1/20 from ALF with fall due to syncopal episode, bradycardia, encephalopathy. Period of unresponsiveness with bradycardia in ED, dopamine initiated, period of asystole with return of HR after CPR compressions initiated. Temporary pacer on 1/21, s/p PPM 1/22. PMH includes complex partial seizures, prostate cancer, RLS, DVT.    PT Comments    Pt with increased confusion and restlessness in comparison to evaluation on 06/05/22; therefore, he is also requiring increased assist with all aspects of mobility. Pt requiring mod-max assist for bed mobility and transfers to standing. Due to heavy retropulsion, was unable to achieve anterior weight shift to take steps. In light of regression, updated d/c plan to SNF.     Recommendations for follow up therapy are one component of a multi-disciplinary discharge planning process, led by the attending physician.  Recommendations may be updated based on patient status, additional functional criteria and insurance authorization.  Follow Up Recommendations  Skilled nursing-short term rehab (<3 hours/day) Can patient physically be transported by private vehicle: No   Assistance Recommended at Discharge Frequent or constant Supervision/Assistance  Patient can return home with the following Assistance with cooking/housework;Direct supervision/assist for medications management;Help with stairs or ramp for entrance;Direct supervision/assist for financial management;Assist for transportation;Two people to help with walking and/or transfers;A lot of help with bathing/dressing/bathroom   Equipment Recommendations  Other (comment) (defer)    Recommendations for Other Services       Precautions / Restrictions Precautions Precautions: Fall;ICD/Pacemaker Precaution Comments: Pacemaker  precautions LUE Restrictions Weight Bearing Restrictions: Yes LUE Weight Bearing:  (pacemaker restrictions on LUE - handout administered) LUE Partial Weight Bearing Percentage or Pounds: <5 lbs     Mobility  Bed Mobility Overal bed mobility: Needs Assistance Bed Mobility: Supine to Sit, Sit to Supine     Supine to sit: Mod assist Sit to supine: Mod assist   General bed mobility comments: Pt initiating with significantly increased time, ultimately requiring heavy modA to bring trunk to upright and use of bed pad to scoot hips anteriorly. Assist for BLE's back into bed. +2 to boost up    Transfers Overall transfer level: Needs assistance Equipment used: Rollator (4 wheels) Transfers: Sit to/from Stand Sit to Stand: Mod assist, Max assist           General transfer comment: Heavy mod-maxA to stand from edge of bed, pt with retropulsion and anterior foot slide. Unable to weight shift to take steps    Ambulation/Gait                   Stairs             Wheelchair Mobility    Modified Rankin (Stroke Patients Only)       Balance Overall balance assessment: Needs assistance, History of Falls Sitting-balance support: Feet supported Sitting balance-Leahy Scale: Poor Sitting balance - Comments: posterior lean, requiring close min guard-minA Postural control: Posterior lean Standing balance support: Bilateral upper extremity supported Standing balance-Leahy Scale: Zero Standing balance comment: Bracing back onto bed                            Cognition Arousal/Alertness: Awake/alert Behavior During Therapy: Anxious, Impulsive, Restless Overall Cognitive Status: No family/caregiver present to determine baseline cognitive functioning Area of Impairment: Attention, Memory, Following commands, Safety/judgement, Awareness, Problem solving,  Orientation                 Orientation Level: Situation, Place Current Attention Level:  Focused Memory: Decreased recall of precautions, Decreased short-term memory Following Commands: Follows one step commands inconsistently Safety/Judgement: Decreased awareness of safety, Decreased awareness of deficits Awareness: Anticipatory Problem Solving: Slow processing, Decreased initiation, Difficulty sequencing, Requires tactile cues, Requires verbal cues General Comments: Pt perseverating on their being an issue; referring to PT as "Danielle," increased time to initiate all tasks        Exercises      General Comments General comments (skin integrity, edema, etc.): HR 76-79 BPM throughout      Pertinent Vitals/Pain Pain Assessment Pain Assessment: Faces Faces Pain Scale: Hurts a little bit Pain Location: generalized with movement, unable to state where Pain Descriptors / Indicators: Discomfort, Grimacing, Guarding Pain Intervention(s): Monitored during session    Home Living Family/patient expects to be discharged to:: Assisted living                 Home Equipment: Rollator (4 wheels);Other (comment);Shower seat;Grab bars - tub/shower;Grab bars - toilet      Prior Function            PT Goals (current goals can now be found in the care plan section) Acute Rehab PT Goals Potential to Achieve Goals: Fair Progress towards PT goals: Not progressing toward goals - comment    Frequency    Min 3X/week      PT Plan Discharge plan needs to be updated    Co-evaluation              AM-PAC PT "6 Clicks" Mobility   Outcome Measure  Help needed turning from your back to your side while in a flat bed without using bedrails?: A Lot Help needed moving from lying on your back to sitting on the side of a flat bed without using bedrails?: A Lot Help needed moving to and from a bed to a chair (including a wheelchair)?: A Lot Help needed standing up from a chair using your arms (e.g., wheelchair or bedside chair)?: A Lot Help needed to walk in hospital  room?: Total Help needed climbing 3-5 steps with a railing? : Total 6 Click Score: 10    End of Session Equipment Utilized During Treatment: Gait belt Activity Tolerance: Other (comment) (limited due to impaired cognition) Patient left: in bed;with call bell/phone within reach;with bed alarm set;with restraints reapplied Nurse Communication: Mobility status PT Visit Diagnosis: Other abnormalities of gait and mobility (R26.89);History of falling (Z91.81);Difficulty in walking, not elsewhere classified (R26.2)     Time: 1021-1050 PT Time Calculation (min) (ACUTE ONLY): 29 min  Charges:  $Therapeutic Activity: 23-37 mins                     Wyona Almas, PT, DPT Acute Rehabilitation Services Office [phone]    Deno Etienne 06/06/2022, 10:58 AM

## 2022-06-06 NOTE — Progress Notes (Signed)
PROGRESS NOTE    Kenneth Bowen  ZYS:[id number] DOB: 11-14-1930 DOA: 06/02/2022 PCP: Sueanne Margarita, DO   Brief Narrative:  89 year old male with history of unprovoked DVT, prostate cancer, seizure presented from assisted living after possible syncope or near syncope.  On EMS arrival, he was very bradycardic requiring atropine; also noted to be hypothermic in the ED with rectal temperature of 86 and was confused.  Heart rate was in the 40s.  He was started on antibiotics, bair hugger, warmed IV fluids and admitted to ICU under PCCM service.  Code stroke was called because of patient becoming unresponsive with fixed pupils; neurology was consulted.  CT of the brain did not show any acute intracranial abnormity and CTA was negative for LVO or hemodynamically significant stenosis.  EEG was normal.  He subsequently had bradycardic arrest requiring CPR.  He was placed on dopamine drip.  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  He was transferred to [facility] service from 06/06/2022 onwards.  Assessment & Plan:   Complete heart block/symptomatic bradycardia with possible syncope/near syncope -Status post bradycardic arrest x 3 -Status post permanent pacemaker placement on 06/04/2022.  Off dopamine. -EP signed off on 06/05/2022 and recommended outpatient EP follow-up. -Transferred to [facility] service from 06/06/2022 onwards  Hypothermia -Resolved.  Sepsis has been ruled out.  Currently off of antibiotics  Acute encephalopathy in the setting of severe symptomatic bradycardia History of complex partial seizures -Possibly from hypothermia and bradycardia -CT head, CTA head and neck were negative for acute intracranial abnormity.  EEG negative for acute seizure. -Continue divalproex and Topamax.  Neurology has signed off.  Outpatient follow-up with neurology.  Thrombocytopenia -No signs of bleeding.  Monitor intermittently.  History of unprovoked DVT -Can resume Eliquis from 06/10/2022  onwards  Physical deconditioning -Extremely deconditioned.  Might need SNF placement.  Will ask PT/OT to reevaluate.  Anemia of chronic disease -From chronic illnesses.  Hemoglobin stable.  Monitor intermittently  Goals of care -Palliative care consultation for goals of care discussion    DVT prophylaxis: SCDs Code Status: Full Family Communication: None at bedside Disposition Plan: Status is: Inpatient Remains inpatient appropriate because: Of severity of illness.    Consultants: PCCM/neurology/cardiology/EP  Procedures: As above  Antimicrobials:  Anti-infectives (From admission, onward)    Start     Dose/Rate Route Frequency Ordered Stop   06/04/22 2200  ceFAZolin (ANCEF) IVPB 1 g/50 mL premix        1 g 100 mL/hr over 30 Minutes Intravenous Every 8 hours 06/04/22 1642 06/06/22 0936   06/04/22 1145  gentamicin (GARAMYCIN) 80 mg in sodium chloride 0.9 % 500 mL irrigation        80 mg Irrigation On call 06/04/22 1049 06/04/22 1528   06/04/22 1145  ceFAZolin (ANCEF) IVPB 2g/100 mL premix        2 g 200 mL/hr over 30 Minutes Intravenous On call 06/04/22 1049 06/04/22 1517   06/03/22 0000  piperacillin-tazobactam (ZOSYN) IVPB 3.375 g  Status:  Discontinued       See Hyperspace for full Linked Orders Report.   3.375 g 12.5 mL/hr over 240 Minutes Intravenous Every 8 hours 06/02/22 1232 06/04/22 1427   06/02/22 1511  vancomycin (VANCOREADY) IVPB 1750 mg/350 mL  Status:  Discontinued        1,750 mg 175 mL/hr over 120 Minutes Intravenous Every 24 hours 06/02/22 1512 06/03/22 1454   06/02/22 1245  piperacillin-tazobactam (ZOSYN) IVPB 3.375 g       See Hyperspace for  full Linked Orders Report.   3.375 g 100 mL/hr over 30 Minutes Intravenous  Once 06/02/22 1232 06/02/22 1601   06/02/22 1245  vancomycin (VANCOREADY) IVPB 1750 mg/350 mL  Status:  Discontinued        1,750 mg 175 mL/hr over 120 Minutes Intravenous  Once 06/02/22 1232 06/02/22 1512         Subjective: Patient seen and examined at bedside.  Feels weak.  No fever, vomiting, chest pain or worsening shortness of breath reported.  Objective: Vitals:   06/06/22 0311 06/06/22 0312 06/06/22 0707 06/06/22 1111  BP:  138/78 (!) 137/52 122/69  Pulse:  72 (!) 109 60  Resp: '19 19 20 18  '$ Temp:  98.2 F (36.8 C) (!) 97.5 F (36.4 C) 98.1 F (36.7 C)  TempSrc:  Oral Oral Oral  SpO2:  97%  91%  Weight:  83.6 kg    Height:        Intake/Output Summary (Last 24 hours) at 06/06/2022 1313 Last data filed at 06/06/2022 1218 Gross per 24 hour  Intake 300 ml  Output 400 ml  Net -100 ml   Filed Weights   06/02/22 1500 06/04/22 0500 06/06/22 0312  Weight: 83.2 kg 82.5 kg 83.6 kg    Examination:  General exam: Appears calm and comfortable.  Chronically ill and deconditioned.  Elderly male lying in bed.  Currently on room air. Respiratory system: Bilateral decreased breath sounds at bases with some scattered crackles Cardiovascular system: S1 & S2 heard, Rate controlled currently Gastrointestinal system: Abdomen is nondistended, soft and nontender. Normal bowel sounds heard. Extremities: No cyanosis, clubbing, edema  Central nervous system: Alert and oriented.  Slow to respond.  Poor historian.  No focal neurological deficits. Moving extremities Skin: No rashes, lesions or ulcers Psychiatry: Flat affect.  Not agitated.    Data Reviewed: I have personally reviewed following labs and imaging studies  CBC: Recent Labs  Lab 06/03/22 0154 06/03/22 0422 06/03/22 2108 06/05/22 0502 06/06/22 0025  WBC 6.4 6.3 8.3 8.8 9.6  NEUTROABS  --  5.3  --  6.8  --   HGB 11.1* 11.6* 11.7* 11.0* 11.3*  HCT 33.2* 33.6* 35.1* 32.1* 33.2*  MCV 94.1 92.3 97.2 95.3 94.6  PLT 163 156 149* 147* 259*   Basic Metabolic Panel: Recent Labs  Lab 06/02/22 1250 06/02/22 1418 06/02/22 2234 06/03/22 0154 06/03/22 0422 06/03/22 2108 06/04/22 0440 06/05/22 0502 06/06/22 0025  NA 136   < >   --  135 136  --  136 139 138  K 4.5   < > 4.4 4.3 3.9  --  3.9 3.5 3.8  CL 104  --   --  104 104  --  105 106 104  CO2 26  --   --  24 24  --  '23 26 25  '$ GLUCOSE 92  --   --  102* 103*  --  109* 82 100*  BUN 16  --   --  13 13  --  '17 18 18  '$ CREATININE 0.64  --   --  0.79 0.85 1.07 0.93 0.86 0.83  CALCIUM 8.2*  --   --  8.7* 8.8*  --  8.7* 8.6* 8.6*  MG 1.8  --  1.6*  --   --   --  1.9 1.7 2.0   < > = values in this interval not displayed.   GFR: Estimated Creatinine Clearance: 64.3 mL/min (by C-G formula based on SCr of 0.83 mg/dL). Liver Function  Tests: Recent Labs  Lab 06/03/22 0422  AST 31  ALT 26  ALKPHOS 60  BILITOT 0.8  PROT 5.7*  ALBUMIN 2.9*   No results for input(s): "LIPASE", "AMYLASE" in the last 168 hours. Recent Labs  Lab 06/02/22 1650 06/03/22 0422  AMMONIA 43* 42*   Coagulation Profile: Recent Labs  Lab 06/02/22 1650  INR 1.1   Cardiac Enzymes: No results for input(s): "CKTOTAL", "CKMB", "CKMBINDEX", "TROPONINI" in the last 168 hours. BNP (last 3 results) No results for input(s): "PROBNP" in the last 8760 hours. HbA1C: No results for input(s): "HGBA1C" in the last 72 hours. CBG: Recent Labs  Lab 06/03/22 1952  GLUCAP 128*   Lipid Profile: No results for input(s): "CHOL", "HDL", "LDLCALC", "TRIG", "CHOLHDL", "LDLDIRECT" in the last 72 hours. Thyroid Function Tests: No results for input(s): "TSH", "T4TOTAL", "FREET4", "T3FREE", "THYROIDAB" in the last 72 hours. Anemia Panel: No results for input(s): "VITAMINB12", "FOLATE", "FERRITIN", "TIBC", "IRON", "RETICCTPCT" in the last 72 hours. Sepsis Labs: Recent Labs  Lab 06/02/22 1131 06/02/22 1348 06/02/22 1650  PROCALCITON  --   --  <0.10  LATICACIDVEN 0.8 0.6  --     Recent Results (from the past 240 hour(s))  Resp panel by RT-PCR (RSV, Flu A&B, Covid) Anterior Nasal Swab     Status: None   Collection Time: 06/02/22 11:03 AM   Specimen: Anterior Nasal Swab  Result Value Ref Range Status    SARS Coronavirus 2 by RT PCR NEGATIVE NEGATIVE Final    Comment: (NOTE) SARS-CoV-2 target nucleic acids are NOT DETECTED.  The SARS-CoV-2 RNA is generally detectable in upper respiratory specimens during the acute phase of infection. The lowest concentration of SARS-CoV-2 viral copies this assay can detect is 138 copies/mL. A negative result does not preclude SARS-Cov-2 infection and should not be used as the sole basis for treatment or other patient management decisions. A negative result may occur with  improper specimen collection/handling, submission of specimen other than nasopharyngeal swab, presence of viral mutation(s) within the areas targeted by this assay, and inadequate number of viral copies(<138 copies/mL). A negative result must be combined with clinical observations, patient history, and epidemiological information. The expected result is Negative.  Fact Sheet for Patients:  EntrepreneurPulse.com.au  Fact Sheet for Healthcare Providers:  IncredibleEmployment.be  This test is no t yet approved or cleared by the [place] FDA and  has been authorized for detection and/or diagnosis of SARS-CoV-2 by FDA under an Emergency Use Authorization (EUA). This EUA will remain  in effect (meaning this test can be used) for the duration of the COVID-19 declaration under Section 564(b)(1) of the Act, 21 U.S.C.section 360bbb-3(b)(1), unless the authorization is terminated  or revoked sooner.       Influenza A by PCR NEGATIVE NEGATIVE Final   Influenza B by PCR NEGATIVE NEGATIVE Final    Comment: (NOTE) The Xpert Xpress SARS-CoV-2/FLU/RSV plus assay is intended as an aid in the diagnosis of influenza from Nasopharyngeal swab specimens and should not be used as a sole basis for treatment. Nasal washings and aspirates are unacceptable for Xpert Xpress SARS-CoV-2/FLU/RSV testing.  Fact Sheet for  Patients: EntrepreneurPulse.com.au  Fact Sheet for Healthcare Providers: IncredibleEmployment.be  This test is not yet approved or cleared by the [place] FDA and has been authorized for detection and/or diagnosis of SARS-CoV-2 by FDA under an Emergency Use Authorization (EUA). This EUA will remain in effect (meaning this test can be used) for the duration of the COVID-19 declaration under Section 564(b)(1)  of the Act, 21 U.S.C. section 360bbb-3(b)(1), unless the authorization is terminated or revoked.     Resp Syncytial Virus by PCR NEGATIVE NEGATIVE Final    Comment: (NOTE) Fact Sheet for Patients: EntrepreneurPulse.com.au  Fact Sheet for Healthcare Providers: IncredibleEmployment.be  This test is not yet approved or cleared by the [place] FDA and has been authorized for detection and/or diagnosis of SARS-CoV-2 by FDA under an Emergency Use Authorization (EUA). This EUA will remain in effect (meaning this test can be used) for the duration of the COVID-19 declaration under Section 564(b)(1) of the Act, 21 U.S.C. section 360bbb-3(b)(1), unless the authorization is terminated or revoked.  Performed at [facility], [place] [address] [place], [place] [address]   Blood culture (routine x 2)     Status: None (Preliminary result)   Collection Time: 06/02/22 11:31 AM   Specimen: BLOOD RIGHT ARM  Result Value Ref Range Status   Specimen Description BLOOD RIGHT ARM  Final   Special Requests   Final    BOTTLES DRAWN AEROBIC AND ANAEROBIC Blood Culture adequate volume   Culture   Final    NO GROWTH 4 DAYS Performed at [facility], [place] [address] [place], [place] [address]    Report Status PENDING  Incomplete  Blood culture (routine x 2)     Status: None (Preliminary result)   Collection Time: 06/02/22 11:59 AM   Specimen: BLOOD RIGHT HAND  Result Value Ref Range Status   Specimen  Description BLOOD RIGHT HAND  Final   Special Requests   Final    BOTTLES DRAWN AEROBIC AND ANAEROBIC Blood Culture results may not be optimal due to an inadequate volume of blood received in culture bottles   Culture   Final    NO GROWTH 4 DAYS Performed at [facility], [place] [address], [place], [place] [address]    Report Status PENDING  Incomplete  MRSA Next Gen by PCR, Nasal     Status: None   Collection Time: 06/02/22  2:51 PM   Specimen: Nasal Mucosa; Nasal Swab  Result Value Ref Range Status   MRSA by PCR Next Gen NOT DETECTED NOT DETECTED Final    Comment: (NOTE) The GeneXpert MRSA Assay (FDA approved for NASAL specimens only), is one component of a comprehensive MRSA colonization surveillance program. It is not intended to diagnose MRSA infection nor to guide or monitor treatment for MRSA infections. Test performance is not FDA approved in patients less than 71 years old. Performed at [facility], [place] [address] [place], [place] [address]   Surgical PCR screen     Status: Abnormal   Collection Time: 06/04/22 10:52 AM   Specimen: Nasal Mucosa; Nasal Swab  Result Value Ref Range Status   MRSA, PCR NEGATIVE NEGATIVE Final   Staphylococcus aureus POSITIVE (A) NEGATIVE Final    Comment: (NOTE) The Xpert SA Assay (FDA approved for NASAL specimens in patients 62 years of age and older), is one component of a comprehensive surveillance program. It is not intended to diagnose infection nor to guide or monitor treatment. Performed at [facility], [place] [address], [place], [place] [address]          Radiology Studies: DG Chest Port 1 View  Result Date: 06/05/2022 CLINICAL DATA:  Pacemaker placement EXAM: PORTABLE CHEST 1 VIEW COMPARISON:  06/04/2022 FINDINGS: Dual lead pacemaker placed from a left subclavian approach. Leads in the region the right atrium and right ventricle.  No pneumothorax. Mild patchy atelectasis at the lung bases persists. Right  internal jugular venous access sheath remains. IMPRESSION: Dual lead pacemaker placed from a left subclavian approach. No pneumothorax. Electronically Signed   By: Nelson Chimes M.D.   On: 06/05/2022 08:21   EP PPM/ICD IMPLANT  Result Date: 06/04/2022 CONCLUSIONS:  1. Successful implantation of a [employer] Jude dual-chamber pacemaker for symptomatic bradycardia due to complete heart block  2. No early apparent complications.       Cristopher Peru, MD 06/04/2022 8:51 PM       Scheduled Meds:  Chlorhexidine Gluconate Cloth  6 each Topical Q0600   divalproex  500 mg Oral Daily   docusate sodium  100 mg Oral BID   leptospermum manuka honey  1 Application Topical Daily   mupirocin ointment  1 Application Nasal BID   polyethylene glycol  17 g Oral Daily   sodium chloride flush  3 mL Intravenous Q12H   sodium chloride flush  3 mL Intravenous Q12H   topiramate  50 mg Oral QHS   Continuous Infusions:  sodium chloride            Aline August, MD [facility] 06/06/2022, 1:13 PM

## 2022-06-06 NOTE — TOC Initial Note (Signed)
Transition of Care [facility]) - Initial/Assessment Note    Patient Details  Name: Kenneth Bowen MRN: [id number] Date of Birth: [birth year]  Transition of Care [facility]) CM/SW Contact:    Bjorn Pippin, LCSW Phone Number: 06/06/2022, 12:59 PM  Clinical Narrative:                 CSW spoke with pt wife via phone regarding SNF recommendation. Wife agreed that pt needs to go to SNF and he would be willing to go before returning back to ALF. She reports they are from [facility]. CSW completed fl2 and faxed out. CSW with provide pt family with bed offers tomorrow. TOC will continue to follow.   Expected Discharge Plan: [place] Barriers to Discharge: Continued Medical Work up   Patient Goals and CMS Choice            Expected Discharge Plan and Services       Living arrangements for the past 2 months: [place]                                      Prior Living Arrangements/Services Living arrangements for the past 2 months: [place] Lives with:: Spouse          Need for Family Participation in Patient Care: Yes (Comment) Care giver support system in place?: Yes (comment) Current home services: DME, Homehealth aide Criminal Activity/Legal Involvement Pertinent to Current Situation/Hospitalization: No - Comment as needed  Activities of Daily Living Home Assistive Devices/Equipment: Grab bars in shower ADL Screening (condition at time of admission) Patient's cognitive ability adequate to safely complete daily activities?: Yes Is the patient deaf or have difficulty hearing?: Yes Does the patient have difficulty seeing, even when wearing glasses/contacts?: No Does the patient have difficulty concentrating, remembering, or making decisions?: No Patient able to express need for assistance with ADLs?: Yes Does the patient have difficulty dressing or bathing?: No Independently performs ADLs?: Yes (appropriate for developmental  age) Does the patient have difficulty walking or climbing stairs?: Yes Weakness of Legs: None Weakness of Arms/Hands: None  Permission Sought/Granted      Share Information with NAME: [name]     Permission granted to share info w Relationship: Wife  Permission granted to share info w Contact Information: [phone]  Emotional Assessment         Alcohol / Substance Use: Not Applicable Psych Involvement: No (comment)  Admission diagnosis:  Symptomatic bradycardia [R00.1] Patient Active Problem List   Diagnosis Date Noted   Acute respiratory failure with hypoxia ([place]) 06/05/2022   Physical deconditioning 06/05/2022   Heart block AV complete ([place]) 06/04/2022   Altered mental status 06/03/2022   Bradycardia 06/02/2022   Encephalopathy acute 06/02/2022   Sepsis ([place]) 06/02/2022   Suspected stroke patient last known to be well 2 to 3 hours ago 06/02/2022   Hypothermia 06/02/2022   Therapeutic drug monitoring 06/10/2017   Partial symptomatic epilepsy with complex partial seizures, not intractable, without status epilepticus ([place]) 12/05/2016   Syncope 07/29/2016   Unresponsiveness 07/28/2016   BPH (benign prostatic hyperplasia) 07/28/2016   Dyslipidemia 07/28/2016   Anxiety    History of prostate cancer    TIA (transient ischemic attack) 07/12/2016   PCP:  Sueanne Margarita, DO Pharmacy:   [id number] [place], [place] [id number]: [phone]: [phone]  Social Determinants of Health (SDOH) Social History: SDOH Screenings   Food Insecurity: No Food Insecurity (06/02/2022)  Housing: Low Risk  (06/02/2022)  Transportation Needs: No Transportation Needs (06/02/2022)  Utilities: Not At Risk (06/02/2022)  Tobacco Use: Low Risk  (06/05/2022)   SDOH Interventions:     Readmission Risk Interventions     No data to display         Beckey Rutter, MSW, LCSWA, LCASA Transitions of Care   Clinical Social Worker I

## 2022-06-06 NOTE — NC FL2 (Signed)
[place] LEVEL OF CARE FORM     IDENTIFICATION  Patient Name: Kenneth Bowen Birthdate: [birth year] Sex: male Admission Date (Current Location): 06/02/2022  [facility] and [place] Number:  Herbalist and Address:         Provider Number: [id number]  Attending Physician Name and Address:  Aline August, MD  Relative Name and Phone Number:  Joseph Bias (wife) [phone]    Current Level of Care: Hospital Recommended Level of Care: [place] Prior Approval Number:    Date Approved/Denied:   PASRR Number: [id number]  Discharge Plan: SNF    Current Diagnoses: Patient Active Problem List   Diagnosis Date Noted   Acute respiratory failure with hypoxia ([place]) 06/05/2022   Physical deconditioning 06/05/2022   Heart block AV complete ([place]) 06/04/2022   Altered mental status 06/03/2022   Bradycardia 06/02/2022   Encephalopathy acute 06/02/2022   Sepsis ([place]) 06/02/2022   Suspected stroke patient last known to be well 2 to 3 hours ago 06/02/2022   Hypothermia 06/02/2022   Therapeutic drug monitoring 06/10/2017   Partial symptomatic epilepsy with complex partial seizures, not intractable, without status epilepticus ([place]) 12/05/2016   Syncope 07/29/2016   Unresponsiveness 07/28/2016   BPH (benign prostatic hyperplasia) 07/28/2016   Dyslipidemia 07/28/2016   Anxiety    History of prostate cancer    TIA (transient ischemic attack) 07/12/2016    Orientation RESPIRATION BLADDER Height & Weight     Self, Situation  Normal Incontinent, External catheter Weight: 184 lb 4.9 oz (83.6 kg) Height:  '5\' 11"'$  (180.3 cm)  BEHAVIORAL SYMPTOMS/MOOD NEUROLOGICAL BOWEL NUTRITION STATUS      Continent Diet (See dc summary)  AMBULATORY STATUS COMMUNICATION OF NEEDS Skin   Extensive Assist Verbally Normal                       Personal Care Assistance Level of Assistance  Bathing, Feeding, Dressing Bathing Assistance: Maximum  assistance Feeding assistance: Limited assistance Dressing Assistance: Maximum assistance     Functional Limitations Info  Sight, Hearing, Speech Sight Info: Adequate Hearing Info: Adequate Speech Info: Adequate    SPECIAL CARE FACTORS FREQUENCY  PT (By licensed PT), OT (By licensed OT)     PT Frequency: 5xweek OT Frequency: 5xweek            Contractures Contractures Info: Not present    Additional Factors Info  Code Status Code Status Info: Full             Current Medications (06/06/2022):  This is the current hospital active medication list Current Facility-Administered Medications  Medication Dose Route Frequency Provider Last Rate Last Admin   0.9 %  sodium chloride infusion  250 mL Intravenous PRN Evans Lance, MD       acetaminophen (TYLENOL) tablet 325-650 mg  325-650 mg Oral Q4H PRN Evans Lance, MD   650 mg at 06/05/22 0302   Chlorhexidine Gluconate Cloth 2 % PADS 6 each  6 each Topical Q0600 Jacky Kindle, MD   6 each at 06/06/22 0619   divalproex (DEPAKOTE ER) 24 hr tablet 500 mg  500 mg Oral Daily Evans Lance, MD   500 mg at 06/05/22 2000   docusate sodium (COLACE) capsule 100 mg  100 mg Oral BID PRN Evans Lance, MD       docusate sodium (COLACE) capsule 100 mg  100 mg Oral BID Evans Lance, MD   100 mg at 06/06/22  1021   Gerhardt's butt cream   Topical BID PRN Evans Lance, MD   Given at 06/05/22 0400   leptospermum manuka honey (MEDIHONEY) paste 1 Application  1 Application Topical Daily Evans Lance, MD   1 Application at 68/12/75 1026   mupirocin ointment (BACTROBAN) 2 % 1 Application  1 Application Nasal BID Jacky Kindle, MD   1 Application at 17/00/17 1024   ondansetron (ZOFRAN) injection 4 mg  4 mg Intravenous Q6H PRN Evans Lance, MD       Oral care mouth rinse  15 mL Mouth Rinse PRN Evans Lance, MD       polyethylene glycol (MIRALAX / GLYCOLAX) packet 17 g  17 g Oral Daily PRN Evans Lance, MD       polyethylene  glycol (MIRALAX / GLYCOLAX) packet 17 g  17 g Oral Daily Evans Lance, MD   17 g at 06/06/22 1022   sodium chloride flush (NS) 0.9 % injection 3 mL  3 mL Intravenous Q12H Evans Lance, MD   3 mL at 06/06/22 1026   sodium chloride flush (NS) 0.9 % injection 3 mL  3 mL Intravenous PRN Evans Lance, MD       sodium chloride flush (NS) 0.9 % injection 3 mL  3 mL Intravenous Q12H Evans Lance, MD   3 mL at 06/06/22 1026   topiramate (TOPAMAX) tablet 50 mg  50 mg Oral QHS Evans Lance, MD   50 mg at 06/05/22 2120     Discharge Medications: Please see discharge summary for a list of discharge medications.  Relevant Imaging Results:  Relevant Lab Results:   Additional Information SSN: [id number]  Beckey Rutter, MSW, LCSWA, LCASA Transitions of Care  Clinical Social Worker I

## 2022-06-07 ENCOUNTER — Inpatient Hospital Stay (HOSPITAL_COMMUNITY): Payer: Medicare Other

## 2022-06-07 DIAGNOSIS — R001 Bradycardia, unspecified: Secondary | ICD-10-CM | POA: Diagnosis not present

## 2022-06-07 DIAGNOSIS — R569 Unspecified convulsions: Secondary | ICD-10-CM

## 2022-06-07 DIAGNOSIS — Z515 Encounter for palliative care: Secondary | ICD-10-CM

## 2022-06-07 DIAGNOSIS — R4182 Altered mental status, unspecified: Secondary | ICD-10-CM | POA: Diagnosis not present

## 2022-06-07 DIAGNOSIS — Z7189 Other specified counseling: Secondary | ICD-10-CM

## 2022-06-07 DIAGNOSIS — Z66 Do not resuscitate: Secondary | ICD-10-CM

## 2022-06-07 DIAGNOSIS — G9340 Encephalopathy, unspecified: Secondary | ICD-10-CM | POA: Diagnosis not present

## 2022-06-07 LAB — BASIC METABOLIC PANEL
Anion gap: 8 (ref 5–15)
BUN: 19 mg/dL (ref 8–23)
CO2: 23 mmol/L (ref 22–32)
Calcium: 8.7 mg/dL — ABNORMAL LOW (ref 8.9–10.3)
Chloride: 107 mmol/L (ref 98–111)
Creatinine, Ser: 1.03 mg/dL (ref 0.61–1.24)
GFR, Estimated: >60 mL/min (ref >60)
Glucose, Bld: 86 mg/dL (ref 70–99)
Potassium: 4 mmol/L (ref 3.5–5.1)
Sodium: 138 mmol/L (ref 135–145)

## 2022-06-07 LAB — CULTURE, BLOOD (ROUTINE X 2)
Culture: NO GROWTH
Culture: NO GROWTH
Special Requests: ADEQUATE

## 2022-06-07 LAB — GLUCOSE, CAPILLARY: Glucose-Capillary: 113 mg/dL — ABNORMAL HIGH (ref 70–99)

## 2022-06-07 LAB — VALPROIC ACID LEVEL: Valproic Acid Lvl: 26 ug/mL — ABNORMAL LOW (ref 50.0–100.0)

## 2022-06-07 MED ORDER — DIVALPROEX SODIUM ER 500 MG PO TB24
750.0000 mg | ORAL_TABLET | Freq: Every day | ORAL | Status: DC
  Administered 2022-06-07 – 2022-06-08 (×2): 750 mg via ORAL
  Filled 2022-06-07 (×3): qty 1

## 2022-06-07 MED ORDER — LORAZEPAM 2 MG/ML IJ SOLN: 1.0000 mg | INTRAMUSCULAR | Status: DC | PRN

## 2022-06-07 MED ORDER — FOLIC ACID 1 MG PO TABS
1.0000 mg | ORAL_TABLET | Freq: Every day | ORAL | Status: DC
  Administered 2022-06-08 – 2022-06-09 (×2): 1 mg via ORAL
  Filled 2022-06-07 (×3): qty 1

## 2022-06-07 MED ORDER — VALPROATE SODIUM 100 MG/ML IV SOLN
1254.0000 mg | Freq: Once | INTRAVENOUS | Status: AC
  Administered 2022-06-07: 1254 mg via INTRAVENOUS
  Filled 2022-06-07: qty 12.54

## 2022-06-07 NOTE — Consult Note (Signed)
Consultation Note Date: 06/07/2022   Patient Name: Kenneth Bowen  DOB: [birth year]  MRN: [id number]  Age / Sex: 89 y.o., male  PCP: Sueanne Margarita, DO Referring Physician: Aline August, MD  Reason for Consultation: Establishing goals of care  HPI/Patient Profile: 89 y.o. male  with past medical history of unprovoked DVT, prostate cancer s/p radiation, RLS, parkinsonism, and seizure admitted on 06/02/2022 with syncope/near syncope. Found to be hypothermic and bradycardic. Code stroke was called because of patient becoming unresponsive with fixed pupils; neurology was consulted. CT of the brain did not show any acute intracranial abnormity and CTA was negative for LVO or hemodynamically significant stenosis. EEG was normal. He subsequently had bradycardic arrest requiring CPR. He was placed on dopamine drip. 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 PMT consulted to discuss [place].  Clinical Assessment and Goals of Care: I have reviewed medical records including EPIC notes, labs and imaging, received report from RN, assessed the patient and then spoke with patient's spouse [name] and son Timmothy Sours to discuss diagnosis prognosis, [place], EOL wishes, disposition and options.  I introduced Palliative Medicine as specialized medical care for people living with serious illness. It focuses on providing relief from the symptoms and stress of a serious illness. The goal is to improve quality of life for both the patient and the family.  [name] shares prior to admission patient was ambulatory.  She tells me he used a walker for his balance.  She tells me he participated in exercise classes at their independent living facility.  She tells me he had a great appetite.  She tells me for about the past 6 months he has had some episodes of unresponsiveness.  She also shares that patient had a really good friend who passed away that has affected him greatly  recently.   We discussed patient's current illness and what it means in the larger context of patient's on-going co-morbidities.  Natural disease trajectory and expectations at EOL were discussed.  We discussed patient's heart block and need for pacemaker.  We discussed his ongoing altered mental status and concern of seizures.  We discussed pending EEG today.  I attempted to elicit values and goals of care important to the patient.    [name] shares that she believes Timmothy Sours previously expressed that he would want to DNR.  We discussed CPR multiple times and [name] confirms he would not want CPR son Timmothy Sours agrees.  They also both agree he would not want mechanical ventilation.  CODE STATUS changed to DNR/DNI.  [name] expresses that she is still hopeful patient can recover enough to go to rehab and regain function.  We discussed allowing time for outcomes.  We discussed continuing medical workup and treating anything reversible.  Family agrees.  Discussed with family the importance of continued conversation with family and the medical providers regarding overall plan of care and treatment options, ensuring decisions are within the context of the patient's values and GOCs.    Questions and concerns were addressed. The family was encouraged to call with questions or concerns.    Primary Decision Maker NEXT OF KIN -spouse [name]    SUMMARY OF RECOMMENDATIONS   -CODE STATUS changed to DNR/DNI, confirmed with spouse [name] and son Timmothy Sours -Otherwise continue current scope of care and medical workup -Family hopeful patient can discharge to rehab -PMT will follow  Code Status/Advance Care Planning: DNR      Primary Diagnoses: Present on Admission:  Bradycardia   I have reviewed the medical record,  interviewed the patient and family, and examined the patient. The following aspects are pertinent.  Past Medical History:  Diagnosis Date   Colon polyps    Diverticulosis    Prostate cancer [facility]) 2015    radiation finished Nov 2015   Seizures ([name]) 07/2016   Social History   Socioeconomic History   Marital status: Married    Spouse name: Not on file   Number of children: Not on file   Years of education: Not on file   Highest education level: Not on file  Occupational History   Occupation: civil Chief Financial Officer  Tobacco Use   Smoking status: Never   Smokeless tobacco: Never  Substance and Sexual Activity   Alcohol use: Yes    Alcohol/week: 7.0 standard drinks of alcohol    Types: 7 Glasses of wine per week    Comment: 4 oz nightly   Drug use: No   Sexual activity: Yes  Other Topics Concern   Not on file  Social History Narrative   Lives with wife at [facility] in [place].  Uses cane.     Social Determinants of Health   Financial Resource Strain: Not on file  Food Insecurity: No Food Insecurity (06/02/2022)   Hunger Vital Sign    Worried About Running Out of Food in the Last Year: Never true    Ran Out of Food in the Last Year: Never true  Transportation Needs: No Transportation Needs (06/02/2022)   PRAPARE - Hydrologist (Medical): No    Lack of Transportation (Non-Medical): No  Physical Activity: Not on file  Stress: Not on file  Social Connections: Not on file   Family History  Problem Relation Age of Onset   Alcoholism Father    Cancer Neg Hx    Colon cancer Neg Hx    Scheduled Meds:  Chlorhexidine Gluconate Cloth  6 each Topical Q0600   divalproex  750 mg Oral Daily   docusate sodium  100 mg Oral BID   folic acid  1 mg Oral Daily   leptospermum manuka honey  1 Application Topical Daily   mupirocin ointment  1 Application Nasal BID   polyethylene glycol  17 g Oral Daily   sodium chloride flush  3 mL Intravenous Q12H   sodium chloride flush  3 mL Intravenous Q12H   topiramate  50 mg Oral QHS   Continuous Infusions:  sodium chloride     PRN Meds:.sodium chloride, acetaminophen, docusate sodium, Gerhardt's butt cream,  haloperidol lactate, LORazepam, ondansetron (ZOFRAN) IV, mouth rinse, polyethylene glycol, sodium chloride flush No Known Allergies Review of Systems  Unable to perform ROS: Patient unresponsive    Physical Exam Constitutional:      General: He is not in acute distress.    Appearance: He is ill-appearing.     Comments: Does not respond to voice or gentle touch Per nurse has been responding with some one word answers throughout the day  Pulmonary:     Effort: Pulmonary effort is normal.  Skin:    General: Skin is warm and dry.     Vital Signs: BP (!) 121/59 (BP Location: Right Arm)   Pulse (!) 59   Temp 98.1 F (36.7 C) (Oral)   Resp 18   Ht '5\' 11"'$  (1.803 m)   Wt 83.6 kg   SpO2 97%   BMI 25.71 kg/m  Pain Scale: 0-10 POSS *See Group Information*: 1-Acceptable,Awake and alert Pain Score: 0-No pain   SpO2:  SpO2: 97 % O2 Device:SpO2: 97 % O2 Flow Rate: .O2 Flow Rate (L/min): 2 L/min  IO: Intake/output summary:  Intake/Output Summary (Last 24 hours) at 06/07/2022 1556 Last data filed at 06/07/2022 1528 Gross per 24 hour  Intake 240 ml  Output 1300 ml  Net -1060 ml    LBM: Last BM Date : 06/07/22 Baseline Weight: Weight: 83.9 kg Most recent weight: Weight: 83.6 kg       *Please note that this is a verbal dictation therefore any spelling or grammatical errors are due to the "[place]" system interpretation.  Juel Burrow, DNP, AGNP-C Palliative Medicine Team [phone] Pager: [phone]

## 2022-06-07 NOTE — TOC Progression Note (Signed)
Transition of Care [facility]) - Progression Note    Patient Details  Name: Kenneth Bowen MRN: [id number] Date of Birth: [birth year]  Transition of Care [facility]) CM/SW Contact  Jinger Neighbors, [place] Phone Number: 06/07/2022, 1:50 PM  Clinical Narrative:    CSW met with pt and his spouse at bedside to present bed offers. [name] spouse would like time to review and ask for CSW to f/u to discuss SNF of choice.    Expected Discharge Plan: [place] Barriers to Discharge: Continued Medical Work up  Expected Discharge Plan and [place] arrangements for the past 2 months: [place]                                       Social Determinants of Health (SDOH) Interventions SDOH Screenings   Food Insecurity: No Food Insecurity (06/02/2022)  Housing: Low Risk  (06/02/2022)  Transportation Needs: No Transportation Needs (06/02/2022)  Utilities: Not At Risk (06/02/2022)  Tobacco Use: Low Risk  (06/05/2022)    Readmission Risk Interventions     No data to display

## 2022-06-07 NOTE — Progress Notes (Signed)
EEG complete - results pending 

## 2022-06-07 NOTE — Progress Notes (Addendum)
Neurology Progress Note  Brief HPI: 89 year old male with PMHx of prostate cancer s/p radiation in 2015, RLS, complex partial seizures on VPA, Topamax, nonprovoked DVT on Eliquis, and Parkinsonism who presented to the ED from ALF 06/02/2022 for evaluation of syncope/near syncope.  Initial findings concerning for hypothermia with a rectal temperature of 86 F and bradycardia s/p multiple doses of atropine.  During PCCM evaluation on 1/20, patient was noted to have dilated and unreactive pupils, was unresponsive, and did not have a cough or gag reflex - a code stroke was activated at that time.  Initial CT imaging was negative for acute intracranial abnormality and patient's mental status rapidly improved as documented in the neurology assessment on 1/20.  Subjective: Today during lunch, patient had an episode where he had stereotyped chewing movements of his mouth with likely no food in it, then became unresponsive to voice, touch or sternal rub, eyes rolled back into his head, shaking of the right arm (observed by family) and O2 sats decreased to 88 on room air.  Rapid response was called by nursing and nasotracheal suctioning was performed by respiratory therapy.  After this episode, patient was noted to be lethargic and disoriented to place, time and situation.  Exam: Vitals:   06/07/22 1219 06/07/22 1220  BP: (!) 152/71   Pulse: 67 64  Resp: (!) 27 (!) 25  Temp:    SpO2: 94% 93%   Gen: Sitting up in bed, interacting with family Resp: non-labored breathing, on nasal cannula  Neuro: Mental Status: Alert and oriented to self, states he is in [facility] but may have been reading this from examiner's badge, able to state correct month and year.  Able to correctly state his age and when his next birthday will be.  States he has not had a seizure in several years.  He is able to follow single step and two-step commands and can state his age and interacts appropriately with family members. Cranial  Nerves: PERRL, EOMI, face is symmetric resting and with movement, facial sensation intact and symmetric to light touch, hearing is intact to voice, tongue is midline.  Motor: Good antigravity strength in all 4 extremities Sensory: Intact and symmetric to light touch throughout Gait: Deferred for patient's safety  Pertinent Labs: CBC    Component Value Date/Time   WBC 9.6 06/06/2022 0025   RBC 3.51 (L) 06/06/2022 0025   HGB 11.3 (L) 06/06/2022 0025   HGB 11.2 (L) 02/14/2022 1416   HGB 12.2 (L) 02/10/2018 1352   HCT 33.2 (L) 06/06/2022 0025   HCT 37.1 (L) 02/10/2018 1352   PLT 143 (L) 06/06/2022 0025   PLT 293 02/14/2022 1416   PLT 267 02/10/2018 1352   MCV 94.6 06/06/2022 0025   MCV 102 (H) 02/10/2018 1352   MCH 32.2 06/06/2022 0025   MCHC 34.0 06/06/2022 0025   RDW 13.1 06/06/2022 0025   RDW 13.1 02/10/2018 1352   LYMPHSABS 0.7 06/05/2022 0502   LYMPHSABS 1.3 02/10/2018 1352   MONOABS 1.0 06/05/2022 0502   EOSABS 0.0 06/05/2022 0502   EOSABS 0.1 02/10/2018 1352   BASOSABS 0.1 06/05/2022 0502   BASOSABS 0.0 02/10/2018 1352   CMP     Component Value Date/Time   NA 138 06/07/2022 0029   NA 138 02/10/2018 1352   K 4.0 06/07/2022 0029   CL 107 06/07/2022 0029   CO2 23 06/07/2022 0029   GLUCOSE 86 06/07/2022 0029   BUN 19 06/07/2022 0029   BUN 15 02/10/2018 1352  CREATININE 1.03 06/07/2022 0029   CREATININE 1.08 02/14/2022 1416   CALCIUM 8.7 (L) 06/07/2022 0029   PROT 5.7 (L) 06/03/2022 0422   PROT 6.4 02/10/2018 1352   ALBUMIN 2.9 (L) 06/03/2022 0422   ALBUMIN 4.0 02/10/2018 1352   AST 31 06/03/2022 0422   AST 19 02/14/2022 1416   ALT 26 06/03/2022 0422   ALT 17 02/14/2022 1416   ALKPHOS 60 06/03/2022 0422   BILITOT 0.8 06/03/2022 0422   BILITOT 0.6 02/14/2022 1416   GFRNONAA >60 06/07/2022 0029   GFRNONAA >60 02/14/2022 1416   GFRAA >60 07/21/2019 0950   RVP negative Blood culture no growth for 5 days Ammonia 43 Depakote level 29  Lactic Acid, Venous     Component Value Date/Time   LATICACIDVEN 0.6 06/02/2022 1348   Imaging Reviewed: CT-scan of the brain 06/02/22: 1. No evidence of acute intracranial abnormality. 2. ASPECTS is 10.   CT angio head and neck + CT perfusion 06/02/22: 1. No large vessel occlusion or proximal hemodynamically significant stenosis. 2. Small (5 mL) area of penumbra reported in the left posterior/inferior temporal region, possibly artifactual given streak artifact in this region  EEG 1/20:  "This study is within normal limits. The excessive beta activity seen in the background is most likely due to the effect of benzodiazepine and is a benign EEG pattern. No seizures or epileptiform discharges were seen throughout the recording. A normal interictal EEG does not exclude the diagnosis of epilepsy."  Assessment: 89 year old male with PMHx of complex partial seizures on valproic acid and Topamax, who presented to the hospital on 1/20 with AMS. Imaging was negative for stroke or hemorrhage and EEG was WNL. Follow-up examination on 1/21 revealed patient to be at his baseline status without neurologic deficit. Today, patient had a seizure-like episode with stereotyped chewing movements, decreased responsiveness and shaking of the right arm.  Patient appeared to be lethargic and confused after this episode. Neurology was called to re-evaluate.  - Upon examination, he appears closer to his baseline, is alert and oriented to person place and time.   - As valproic acid level was 29 on 1/21, will obtain repeat valproic acid level, bolus with valproic acid 15 mg/kg and increase daily valproic acid ER dose to 750 mg QD.   - Reasonable to obtain repeat EEG.  Recommendations: -Routine EEG -Repeat valproic acid level today and recheck in 3 days -IV valproic acid bolus of 15 mg/kg -Increase daily valproic acid dose to 750 mg -Continue Topamax at the current dose -Inpatient seizure precautions  Addendum: - EEG: Intermittent slow,  generalized. This study is suggestive of mild diffuse encephalopathy, nonspecific to etiology. No seizures or epileptiform discharges were seen throughout the recording. - Repeat VPA level drawn at 1310 is low at 26.  - Next VPA level on 1/28 (ordered)   Electronically signed: Dr. Kerney Elbe

## 2022-06-07 NOTE — Procedures (Signed)
Patient Name: Kenneth Bowen  MRN: [id number]  Epilepsy Attending: Lora Havens  Referring Physician/Provider: Aline August, MD  Date: 06/07/2022 Duration: 23.08 mins  Patient history: 89yo M with ams. EEG to evaluate for seizure  Level of alertness: Awake  AEDs during EEG study: VPA, TPM  Technical aspects: This EEG study was done with scalp electrodes positioned according to the 10-20 International system of electrode placement. Electrical activity was reviewed with band pass filter of 1-'70Hz'$ , sensitivity of 7 uV/mm, display speed of 16m/sec with a '60Hz'$  notched filter applied as appropriate. EEG data were recorded continuously and digitally stored.  Video monitoring was available and reviewed as appropriate.  Description: The posterior dominant rhythm consists of 9 Hz activity of moderate voltage (25-35 uV) seen predominantly in posterior head regions, symmetric and reactive to eye opening and eye closing. EEG showed intermittent generalized 3 to 6 Hz theta-delta slowing. Hyperventilation and photic stimulation were not performed.     ABNORMALITY - Intermittent slow, generalized  IMPRESSION: This study is suggestive of mild diffuse encephalopathy, nonspecific etiology. No seizures or epileptiform discharges were seen throughout the recording.  Hawraa Stambaugh OBarbra Sarks

## 2022-06-07 NOTE — Significant Event (Signed)
Rapid Response Event Note   Reason for Call :  Decreased LOC  Staff was at bedside assisting patient with his lunch tray. Staff stepped out for a few minutes and when they returned he would not respond to them. His eyes were noted to be rolling up and back.   Initial Focused Assessment:  Pt lying in bed, alert. Oriented to person. Movements are purposeful and equal.   He is noted to have upper airway congestion. Respiratory therapist at bedside and performed NTS. Pt rhonchus throughout lung fields. He remains on 2LNC, SpO2 93%. Breathing is regular, unlabored. His is tachypneic. RR 27 following NTS. He has a strong cough.   VS: T 97.4F, BP 152/71, HR 60, RR 31, SpO2 93% on 2LNC CBG: 113  Interventions:  -CBG -Oral suction set-up  Plan of Care:  -Seizure precautions -Consider bedside swallow evaluation -Concern for aspiration, consider CXR if pt develops increased work of breathing and/or increased supplemental oxygen needs  Call rapid response for additional needs  Event Summary:  MD Notified: Dr. Starla Link Call Time: 1207 Arrival Time: 8110 End Time: [place], RN

## 2022-06-07 NOTE — Progress Notes (Signed)
PROGRESS NOTE    Kenneth Bowen  GHW:[id number] DOB: [birth year] DOA: 06/02/2022 PCP: Sueanne Margarita, DO   Brief Narrative:  89 year old male with history of unprovoked DVT, prostate cancer, seizure presented from assisted living after possible syncope or near syncope.  On EMS arrival, he was very bradycardic requiring atropine; also noted to be hypothermic in the ED with rectal temperature of 86 and was confused.  Heart rate was in the 40s.  He was started on antibiotics, bair hugger, warmed IV fluids and admitted to ICU under PCCM service.  Code stroke was called because of patient becoming unresponsive with fixed pupils; neurology was consulted.  CT of the brain did not show any acute intracranial abnormity and CTA was negative for LVO or hemodynamically significant stenosis.  EEG was normal.  He subsequently had bradycardic arrest requiring CPR.  He was placed on dopamine drip.  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  He was transferred to [facility] service from 06/06/2022 onwards.  Assessment & Plan:   Complete heart block/symptomatic bradycardia with possible syncope/near syncope -Status post bradycardic arrest x 3 -Status post permanent pacemaker placement on 06/04/2022.  Off dopamine. -EP signed off on 06/05/2022 and recommended outpatient EP follow-up. -Transferred to [facility] service from 06/06/2022 onwards  Hypothermia -Resolved.  Sepsis has been ruled out.  Currently off of antibiotics  Acute encephalopathy in the setting of severe symptomatic bradycardia History of complex partial seizures Intermittent agitation -Possibly from hypothermia and bradycardia -CT head, CTA head and neck were negative for acute intracranial abnormity.  EEG negative for acute seizure. -Continue divalproex and Topamax.  Neurology has signed off.  Outpatient follow-up with neurology. -Has had issues with intermittent agitation and had required a dose of Haldol on 06/06/2022.  Thrombocytopenia -No  signs of bleeding.  Monitor intermittently.  History of unprovoked DVT -Can resume Eliquis from 06/10/2022 onwards  Physical deconditioning -Extremely deconditioned.  Will need SNF placement.  TOC following.  Anemia of chronic disease -From chronic illnesses.  Hemoglobin stable.  Monitor intermittently  Goals of care -Palliative care consultation pending.    DVT prophylaxis: SCDs Code Status: Full Family Communication: None at bedside Disposition Plan: Status is: Inpatient Remains inpatient appropriate because: Of severity of illness.  Need for SNF placement.    Consultants: PCCM/neurology/cardiology/EP  Procedures: As above  Antimicrobials:  Anti-infectives (From admission, onward)    Start     Dose/Rate Route Frequency Ordered Stop   06/04/22 2200  ceFAZolin (ANCEF) IVPB 1 g/50 mL premix        1 g 100 mL/hr over 30 Minutes Intravenous Every 8 hours 06/04/22 1642 06/06/22 0936   06/04/22 1145  gentamicin (GARAMYCIN) 80 mg in sodium chloride 0.9 % 500 mL irrigation        80 mg Irrigation On call 06/04/22 1049 06/04/22 1528   06/04/22 1145  ceFAZolin (ANCEF) IVPB 2g/100 mL premix        2 g 200 mL/hr over 30 Minutes Intravenous On call 06/04/22 1049 06/04/22 1517   06/03/22 0000  piperacillin-tazobactam (ZOSYN) IVPB 3.375 g  Status:  Discontinued       See Hyperspace for full Linked Orders Report.   3.375 g 12.5 mL/hr over 240 Minutes Intravenous Every 8 hours 06/02/22 1232 06/04/22 1427   06/02/22 1511  vancomycin (VANCOREADY) IVPB 1750 mg/350 mL  Status:  Discontinued        1,750 mg 175 mL/hr over 120 Minutes Intravenous Every 24 hours 06/02/22 1512 06/03/22 1454   06/02/22 1245  piperacillin-tazobactam (ZOSYN) IVPB 3.375 g       See Hyperspace for full Linked Orders Report.   3.375 g 100 mL/hr over 30 Minutes Intravenous  Once 06/02/22 1232 06/02/22 1601   06/02/22 1245  vancomycin (VANCOREADY) IVPB 1750 mg/350 mL  Status:  Discontinued        1,750 mg 175  mL/hr over 120 Minutes Intravenous  Once 06/02/22 1232 06/02/22 1512        Subjective: Patient seen and examined at bedside.  Poor historian.  Slow to respond.  Nursing staff reports intermittent agitation yesterday requiring Haldol.  No fever, seizures or vomiting reported.  Objective: Vitals:   06/06/22 1111 06/06/22 2222 06/07/22 0334 06/07/22 0710  BP: 122/69 124/68 (!) 131/50 (!) 149/84  Pulse: 60 87 65 97  Resp: '18 20 20 '$ (!) 21  Temp: 98.1 F (36.7 C) 99.2 F (37.3 C) 97.8 F (36.6 C) 98.1 F (36.7 C)  TempSrc: Oral Oral Oral Oral  SpO2: 91% 92% 91% 94%  Weight:      Height:        Intake/Output Summary (Last 24 hours) at 06/07/2022 0719 Last data filed at 06/07/2022 0600 Gross per 24 hour  Intake 360 ml  Output 1000 ml  Net -640 ml    Filed Weights   06/02/22 1500 06/04/22 0500 06/06/22 0312  Weight: 83.2 kg 82.5 kg 83.6 kg    Examination:  General: On room air.  No distress.  Slow to respond.  Poor historian.  Wakes up only slightly.  Has wrist restraints.  Currently not agitated. respiratory: Decreased breath sounds at bases bilaterally with some crackles with intermittent tachypnea CVS: Currently rate controlled; S1-S2 heard  abdominal: Soft, nontender, slightly distended, no organomegaly; bowel sounds are heard  extremities: Trace lower extremity edema; no clubbing.       Data Reviewed: I have personally reviewed following labs and imaging studies  CBC: Recent Labs  Lab 06/03/22 0154 06/03/22 0422 06/03/22 2108 06/05/22 0502 06/06/22 0025  WBC 6.4 6.3 8.3 8.8 9.6  NEUTROABS  --  5.3  --  6.8  --   HGB 11.1* 11.6* 11.7* 11.0* 11.3*  HCT 33.2* 33.6* 35.1* 32.1* 33.2*  MCV 94.1 92.3 97.2 95.3 94.6  PLT 163 156 149* 147* 143*    Basic Metabolic Panel: Recent Labs  Lab 06/02/22 1250 06/02/22 1418 06/02/22 2234 06/03/22 0154 06/03/22 0422 06/03/22 2108 06/04/22 0440 06/05/22 0502 06/06/22 0025 06/07/22 0029  NA 136   < >  --    < >  136  --  136 139 138 138  K 4.5   < > 4.4   < > 3.9  --  3.9 3.5 3.8 4.0  CL 104  --   --    < > 104  --  105 106 104 107  CO2 26  --   --    < > 24  --  '23 26 25 23  '$ GLUCOSE 92  --   --    < > 103*  --  109* 82 100* 86  BUN 16  --   --    < > 13  --  '17 18 18 19  '$ CREATININE 0.64  --   --    < > 0.85 1.07 0.93 0.86 0.83 1.03  CALCIUM 8.2*  --   --    < > 8.8*  --  8.7* 8.6* 8.6* 8.7*  MG 1.8  --  1.6*  --   --   --  1.9 1.7 2.0  --    < > = values in this interval not displayed.    GFR: Estimated Creatinine Clearance: 51.8 mL/min (by C-G formula based on SCr of 1.03 mg/dL). Liver Function Tests: Recent Labs  Lab 06/03/22 0422  AST 31  ALT 26  ALKPHOS 60  BILITOT 0.8  PROT 5.7*  ALBUMIN 2.9*    No results for input(s): "LIPASE", "AMYLASE" in the last 168 hours. Recent Labs  Lab 06/02/22 1650 06/03/22 0422  AMMONIA 43* 42*    Coagulation Profile: Recent Labs  Lab 06/02/22 1650  INR 1.1    Cardiac Enzymes: No results for input(s): "CKTOTAL", "CKMB", "CKMBINDEX", "TROPONINI" in the last 168 hours. BNP (last 3 results) No results for input(s): "PROBNP" in the last 8760 hours. HbA1C: No results for input(s): "HGBA1C" in the last 72 hours. CBG: Recent Labs  Lab 06/03/22 1952  GLUCAP 128*    Lipid Profile: No results for input(s): "CHOL", "HDL", "LDLCALC", "TRIG", "CHOLHDL", "LDLDIRECT" in the last 72 hours. Thyroid Function Tests: No results for input(s): "TSH", "T4TOTAL", "FREET4", "T3FREE", "THYROIDAB" in the last 72 hours. Anemia Panel: No results for input(s): "VITAMINB12", "FOLATE", "FERRITIN", "TIBC", "IRON", "RETICCTPCT" in the last 72 hours. Sepsis Labs: Recent Labs  Lab 06/02/22 1131 06/02/22 1348 06/02/22 1650  PROCALCITON  --   --  <0.10  LATICACIDVEN 0.8 0.6  --      Recent Results (from the past 240 hour(s))  Resp panel by RT-PCR (RSV, Flu A&B, Covid) Anterior Nasal Swab     Status: None   Collection Time: 06/02/22 11:03 AM   Specimen:  Anterior Nasal Swab  Result Value Ref Range Status   SARS Coronavirus 2 by RT PCR NEGATIVE NEGATIVE Final    Comment: (NOTE) SARS-CoV-2 target nucleic acids are NOT DETECTED.  The SARS-CoV-2 RNA is generally detectable in upper respiratory specimens during the acute phase of infection. The lowest concentration of SARS-CoV-2 viral copies this assay can detect is 138 copies/mL. A negative result does not preclude SARS-Cov-2 infection and should not be used as the sole basis for treatment or other patient management decisions. A negative result may occur with  improper specimen collection/handling, submission of specimen other than nasopharyngeal swab, presence of viral mutation(s) within the areas targeted by this assay, and inadequate number of viral copies(<138 copies/mL). A negative result must be combined with clinical observations, patient history, and epidemiological information. The expected result is Negative.  Fact Sheet for Patients:  EntrepreneurPulse.com.au  Fact Sheet for Healthcare Providers:  IncredibleEmployment.be  This test is no t yet approved or cleared by the [place] FDA and  has been authorized for detection and/or diagnosis of SARS-CoV-2 by FDA under an Emergency Use Authorization (EUA). This EUA will remain  in effect (meaning this test can be used) for the duration of the COVID-19 declaration under Section 564(b)(1) of the Act, 21 U.S.C.section 360bbb-3(b)(1), unless the authorization is terminated  or revoked sooner.       Influenza A by PCR NEGATIVE NEGATIVE Final   Influenza B by PCR NEGATIVE NEGATIVE Final    Comment: (NOTE) The Xpert Xpress SARS-CoV-2/FLU/RSV plus assay is intended as an aid in the diagnosis of influenza from Nasopharyngeal swab specimens and should not be used as a sole basis for treatment. Nasal washings and aspirates are unacceptable for Xpert Xpress SARS-CoV-2/FLU/RSV testing.  Fact  Sheet for Patients: EntrepreneurPulse.com.au  Fact Sheet for Healthcare Providers: IncredibleEmployment.be  This test is not yet approved or cleared by the [place]  States FDA and has been authorized for detection and/or diagnosis of SARS-CoV-2 by FDA under an Emergency Use Authorization (EUA). This EUA will remain in effect (meaning this test can be used) for the duration of the COVID-19 declaration under Section 564(b)(1) of the Act, 21 U.S.C. section 360bbb-3(b)(1), unless the authorization is terminated or revoked.     Resp Syncytial Virus by PCR NEGATIVE NEGATIVE Final    Comment: (NOTE) Fact Sheet for Patients: EntrepreneurPulse.com.au  Fact Sheet for Healthcare Providers: IncredibleEmployment.be  This test is not yet approved or cleared by the [place] FDA and has been authorized for detection and/or diagnosis of SARS-CoV-2 by FDA under an Emergency Use Authorization (EUA). This EUA will remain in effect (meaning this test can be used) for the duration of the COVID-19 declaration under Section 564(b)(1) of the Act, 21 U.S.C. section 360bbb-3(b)(1), unless the authorization is terminated or revoked.  Performed at [facility], [place] [address], [place], [place] [address]   Blood culture (routine x 2)     Status: None   Collection Time: 06/02/22 11:31 AM   Specimen: BLOOD RIGHT ARM  Result Value Ref Range Status   Specimen Description BLOOD RIGHT ARM  Final   Special Requests   Final    BOTTLES DRAWN AEROBIC AND ANAEROBIC Blood Culture adequate volume   Culture   Final    NO GROWTH 5 DAYS Performed at [facility], [place] [address], [place], [place] [address]    Report Status 06/07/2022 FINAL  Final  Blood culture (routine x 2)     Status: None   Collection Time: 06/02/22 11:59 AM   Specimen: BLOOD RIGHT HAND  Result Value Ref Range Status   Specimen Description BLOOD RIGHT HAND   Final   Special Requests   Final    BOTTLES DRAWN AEROBIC AND ANAEROBIC Blood Culture results may not be optimal due to an inadequate volume of blood received in culture bottles   Culture   Final    NO GROWTH 5 DAYS Performed at [facility], [place] [address] [place], [place] [address]    Report Status 06/07/2022 FINAL  Final  MRSA Next Gen by PCR, Nasal     Status: None   Collection Time: 06/02/22  2:51 PM   Specimen: Nasal Mucosa; Nasal Swab  Result Value Ref Range Status   MRSA by PCR Next Gen NOT DETECTED NOT DETECTED Final    Comment: (NOTE) The GeneXpert MRSA Assay (FDA approved for NASAL specimens only), is one component of a comprehensive MRSA colonization surveillance program. It is not intended to diagnose MRSA infection nor to guide or monitor treatment for MRSA infections. Test performance is not FDA approved in patients less than 11 years old. Performed at [facility], [place] [address], [place], [place] [address]   Surgical PCR screen     Status: Abnormal   Collection Time: 06/04/22 10:52 AM   Specimen: Nasal Mucosa; Nasal Swab  Result Value Ref Range Status   MRSA, PCR NEGATIVE NEGATIVE Final   Staphylococcus aureus POSITIVE (A) NEGATIVE Final    Comment: (NOTE) The Xpert SA Assay (FDA approved for NASAL specimens in patients 57 years of age and older), is one component of a comprehensive surveillance program. It is not intended to diagnose infection nor to guide or monitor treatment. Performed at [facility], [place] [address] [place], [place] [address]          Radiology Studies: No results  found.      Scheduled Meds:  Chlorhexidine Gluconate Cloth  6 each Topical Q0600   divalproex  500 mg Oral Daily   docusate sodium  100 mg Oral BID   leptospermum manuka honey  1 Application Topical Daily   mupirocin ointment  1 Application Nasal BID   polyethylene glycol  17 g Oral Daily   sodium chloride flush  3 mL Intravenous Q12H   sodium  chloride flush  3 mL Intravenous Q12H   topiramate  50 mg Oral QHS   Continuous Infusions:  sodium chloride            Aline August, MD [facility] 06/07/2022, 7:19 AM

## 2022-06-08 DIAGNOSIS — G9340 Encephalopathy, unspecified: Secondary | ICD-10-CM | POA: Diagnosis not present

## 2022-06-08 DIAGNOSIS — Z515 Encounter for palliative care: Secondary | ICD-10-CM | POA: Diagnosis not present

## 2022-06-08 DIAGNOSIS — R001 Bradycardia, unspecified: Secondary | ICD-10-CM | POA: Diagnosis not present

## 2022-06-08 DIAGNOSIS — R5381 Other malaise: Secondary | ICD-10-CM | POA: Diagnosis not present

## 2022-06-08 LAB — COMPREHENSIVE METABOLIC PANEL
ALT: 24 U/L (ref 0–44)
AST: 35 U/L (ref 15–41)
Albumin: 2.6 g/dL — ABNORMAL LOW (ref 3.5–5.0)
Alkaline Phosphatase: 51 U/L (ref 38–126)
Anion gap: 8 (ref 5–15)
BUN: 20 mg/dL (ref 8–23)
CO2: 25 mmol/L (ref 22–32)
Calcium: 8.5 mg/dL — ABNORMAL LOW (ref 8.9–10.3)
Chloride: 108 mmol/L (ref 98–111)
Creatinine, Ser: 0.86 mg/dL (ref 0.61–1.24)
GFR, Estimated: >60 mL/min (ref >60)
Glucose, Bld: 91 mg/dL (ref 70–99)
Potassium: 3.5 mmol/L (ref 3.5–5.1)
Sodium: 141 mmol/L (ref 135–145)
Total Bilirubin: 0.8 mg/dL (ref 0.3–1.2)
Total Protein: 5.8 g/dL — ABNORMAL LOW (ref 6.5–8.1)

## 2022-06-08 LAB — MAGNESIUM: Magnesium: 1.9 mg/dL (ref 1.7–2.4)

## 2022-06-08 LAB — VITAMIN B12: Vitamin B-12: 513 pg/mL (ref 180–914)

## 2022-06-08 LAB — AMMONIA: Ammonia: 69 umol/L — ABNORMAL HIGH (ref 9–35)

## 2022-06-08 MED ORDER — LACTULOSE 10 GM/15ML PO SOLN
10.0000 g | Freq: Two times a day (BID) | ORAL | Status: DC
  Administered 2022-06-08 – 2022-06-09 (×3): 10 g via ORAL
  Filled 2022-06-08 (×3): qty 15

## 2022-06-08 NOTE — Progress Notes (Addendum)
Palliative Medicine Inpatient Follow Up Note HPI: 89 y.o. male  with past medical history of unprovoked DVT, prostate cancer s/p radiation, RLS, parkinsonism, and seizure admitted on 06/02/2022 with syncope/near syncope. Found to be hypothermic and bradycardic. Code stroke was called because of patient becoming unresponsive with fixed pupils; neurology was consulted. CT of the brain did not show any acute intracranial abnormity and CTA was negative for LVO or hemodynamically significant stenosis. EEG was normal. He subsequently had bradycardic arrest requiring CPR. He was placed on dopamine drip. 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 PMT consulted to discuss [place].   Today's Discussion 06/08/2022  *Please note that this is a verbal dictation therefore any spelling or grammatical errors are due to the "[place] One" system interpretation.  Chart reviewed inclusive of vital signs, progress notes, laboratory results, and diagnostic images.   I met with Kenneth Bowen at bedside this morning. He is resting though does open his eyes and respond when spoken to. He denies pain, dyspnea, and nausea.  I called patients spouse, Kenneth Bowen this morning. I was able to introduce myself and my role as a Palliative care provider. Discussed his present diease processes in the setting of heart block and pacemaker placement. Kenneth Bowen shares concerns as they relate to ongoing seizures.   Created space and opportunity for patients spouse to explore thoughts feelings and fears regarding current medical situation. Reviewed patients ongoing confusion and how this can persist in the setting of a complicated hospitalization. She shares that his heart is straightened out and now they need to deal with his mind.  Reviewed patients ongoing confusion and how this can persist in the setting of a complicated hospitalization. Discussed delirium and how this can be and persist as an ongoing complication which can take months to  resolve and possibly leave patients at a new baseline of cognitive functioning.   Confirmed DNAR/DNI and desire for continued medical care.   Questions and concerns addressed/Palliative Support Provided.  __________________________ Addendum:  Patients wife called to discuss patients mobility and options for placement. We discussed per TOC note review the idea of him transitioning to skilled nursing which she is in agreement with.   Objective Assessment: Vital Signs Vitals:   06/08/22 0500 06/08/22 0808  BP: (!) 150/84 125/77  Pulse: 60 60  Resp: 20 20  Temp: 98.3 F (36.8 C) 98.2 F (36.8 C)  SpO2: 98% 97%    Intake/Output Summary (Last 24 hours) at 06/08/2022 1013 Last data filed at 06/08/2022 9357 Gross per 24 hour  Intake 163.09 ml  Output 950 ml  Net -786.91 ml    Last Weight  Most recent update: 06/08/2022  5:03 AM    Weight  80.5 kg (177 lb 7.5 oz)            Gen:  Frail elderly Caucasian M in NAD HEENT: moist mucous membranes CV: Regular rate and rhythm  PULM: On 2LPM [place], breathing is even and nonlabored ABD: soft/nontender  EXT: Ecchymosis on BUE Neuro: Somnolent  SUMMARY OF RECOMMENDATIONS   DNAR/DNI  Continue current scope of medical care  Continue to allow time for outcomes  Patients wife is hopeful patient can transition to rehabilitation once medically optimized  Ongoing PMT support  Billing based on MDM: High ______________________________________________________________________________________ [place] Team Team Cell Phone: [phone] Please utilize secure chat with additional questions, if there is no response within 30 minutes please call the above phone number  Palliative Medicine Team providers are  available by phone from 7am to 7pm daily and can be reached through the team cell phone.  Should this patient require assistance outside of these hours, please call the patient's attending  physician.

## 2022-06-08 NOTE — Progress Notes (Signed)
Physical Therapy Treatment Patient Details Name: Kenneth Bowen MRN: [id number] DOB: 1930/07/04 Today's Date: 06/08/2022   History of Present Illness 89 yo male presents to [facility] on 1/20 from ALF with fall due to syncopal episode, bradycardia, encephalopathy. Period of unresponsiveness with bradycardia in ED, dopamine initiated, period of asystole with return of HR after CPR compressions initiated. Temporary pacer on 1/21, s/p PPM 1/22. PMH includes complex partial seizures, prostate cancer, RLS, DVT.    PT Comments    Pt is demonstrating gradual progress in reducing his posterior lean with standing mobility. Pt coordinated with his family to bring shoes for next session as he believes he could do better with shoes. While pt still demonstrates strong retropulsion upon transferring to stand, needing modA-maxA for sit to stand transfers, he was able to reduce his posterior lean to progress to only needing as little as minA for static standing balance after faded assistance and cues were provided for greater than ~1 min in duration. Pt was able to progress to taking a few shuffling steps to transfer to the chair using his RW and modA today. Current recommendations remain appropriate. Will continue to follow acutely.     Recommendations for follow up therapy are one component of a multi-disciplinary discharge planning process, led by the attending physician.  Recommendations may be updated based on patient status, additional functional criteria and insurance authorization.  Follow Up Recommendations  Skilled nursing-short term rehab (<3 hours/day) Can patient physically be transported by private vehicle: No   Assistance Recommended at Discharge Frequent or constant Supervision/Assistance  Patient can return home with the following Assistance with cooking/housework;Direct supervision/assist for medications management;Help with stairs or ramp for entrance;Direct supervision/assist for financial  management;Assist for transportation;Two people to help with walking and/or transfers;A lot of help with bathing/dressing/bathroom   Equipment Recommendations  Other (comment) (defer)    Recommendations for Other Services       Precautions / Restrictions Precautions Precautions: Fall;ICD/Pacemaker Precaution Comments: Pacemaker precautions LUE Restrictions Weight Bearing Restrictions: Yes LUE Weight Bearing:  (pacemaker restrictions on LUE - handout administered) LUE Partial Weight Bearing Percentage or Pounds: <5 lbs     Mobility  Bed Mobility Overal bed mobility: Needs Assistance Bed Mobility: Supine to Sit     Supine to sit: Max assist, HOB elevated     General bed mobility comments: Pt needing assistance to bring legs off bed and ascend trunk, using bed pad to assist in scooting hips to EOB while cuing pt to avoid pushing through L UE due to pacemaker precautions.    Transfers Overall transfer level: Needs assistance Equipment used: Rolling walker (2 wheels), 1 person hand held assist Transfers: Sit to/from Stand, Bed to chair/wheelchair/BSC Sit to Stand: Mod assist, Max assist   Step pivot transfers: Mod assist       General transfer comment: Pt transferred to stand 1x from EOB and 1x from recliner holding onto therapist anterior to him with his R UE and then 1x from EOB using the RW. Bil knees blocked with each transfer to stand to prevent feet from sliding anteriorly due to his strong retropulsion, varying from needing mod-maxA depending on the extent of his lean. Once pt reduced his posterior lean (after > ~1-1.5 min) in static standing with assistance faded, he was able to progress to taking small shuffling steps to transfer to L to chair from bed using the RW, modA for stability    Ambulation/Gait  General Gait Details: Took a few shuffling steps from bed to chair with a RW and Public house manager     Modified Rankin (Stroke Patients Only)       Balance Overall balance assessment: Needs assistance, History of Falls Sitting-balance support: Feet supported, No upper extremity supported Sitting balance-Leahy Scale: Fair Sitting balance - Comments: Able to sit statically with min guard for safety Postural control: Posterior lean Standing balance support: Bilateral upper extremity supported, During functional activity Standing balance-Leahy Scale: Poor Standing balance comment: Strong posterior lean with transferring to stand, needing >~1-1.5 min to correct with faded assistance, progressing from maxA for static standing balance to minA with intermittent modA.                            Cognition Arousal/Alertness: Awake/alert Behavior During Therapy: Anxious Overall Cognitive Status: No family/caregiver present to determine baseline cognitive functioning Area of Impairment: Attention, Memory, Following commands, Safety/judgement, Awareness, Problem solving                   Current Attention Level: Sustained Memory: Decreased recall of precautions, Decreased short-term memory Following Commands: Follows one step commands with increased time, Follows one step commands consistently, Follows multi-step commands inconsistently Safety/Judgement: Decreased awareness of safety, Decreased awareness of deficits Awareness: Emergent Problem Solving: Slow processing, Decreased initiation, Difficulty sequencing, Requires tactile cues, Requires verbal cues General Comments: Pt inisisting he needs shoes to walk, believing he could not stand due to not having them present. Pt called wife and requested them to be brought to the hospital for next session. Pt evenutally agreeable to attempt with a change in socks. Pt appears to be very anxious of falling, with a strong retropulsion and needing increased time to correct it (> ~1-1.5 min each time). Needs continual reminders to maintain  his precautions.        Exercises Other Exercises Other Exercises: pt performing several LAQs, seated marching, seated SLR, and seated ankle pumps to review various exercises he can do in supine or sitting    General Comments General comments (skin integrity, edema, etc.): encouraged lower extremity AROM      Pertinent Vitals/Pain Pain Assessment Pain Assessment: Faces Faces Pain Scale: Hurts a little bit Pain Location: back Pain Descriptors / Indicators: Discomfort, Grimacing, Guarding Pain Intervention(s): Limited activity within patient's tolerance, Monitored during session, Repositioned    Home Living                          Prior Function            PT Goals (current goals can now be found in the care plan section) Acute Rehab PT Goals Patient Stated Goal: to walk with shoes PT Goal Formulation: With patient Time For Goal Achievement: 06/19/22 Potential to Achieve Goals: Fair Progress towards PT goals: Progressing toward goals    Frequency    Min 3X/week      PT Plan Current plan remains appropriate    Co-evaluation              AM-PAC PT "6 Clicks" Mobility   Outcome Measure  Help needed turning from your back to your side while in a flat bed without using bedrails?: A Lot Help needed moving from lying on your back to sitting on the side of a flat bed without  using bedrails?: A Lot Help needed moving to and from a bed to a chair (including a wheelchair)?: A Lot Help needed standing up from a chair using your arms (e.g., wheelchair or bedside chair)?: A Lot Help needed to walk in hospital room?: Total Help needed climbing 3-5 steps with a railing? : Total 6 Click Score: 10    End of Session Equipment Utilized During Treatment: Gait belt Activity Tolerance: Patient tolerated treatment well Patient left: with call bell/phone within reach;in chair;with chair alarm set Nurse Communication: Mobility status (NT) PT Visit Diagnosis: Other  abnormalities of gait and mobility (R26.89);History of falling (Z91.81);Difficulty in walking, not elsewhere classified (R26.2);Unsteadiness on feet (R26.81);Muscle weakness (generalized) (M62.81)     Time: 1157-2620 PT Time Calculation (min) (ACUTE ONLY): 36 min  Charges:  $Therapeutic Activity: 8-22 mins $Neuromuscular Re-education: 8-22 mins                     Moishe Spice, PT, DPT Acute Rehabilitation Services  Office: [phone]    Orvan Falconer 06/08/2022, 1:38 PM

## 2022-06-08 NOTE — Progress Notes (Signed)
PROGRESS NOTE    Kenneth Bowen  HGD:[id number] DOB: [birth year] DOA: 06/02/2022 PCP: Sueanne Margarita, DO   Brief Narrative:  89 year old male with history of unprovoked DVT, prostate cancer, seizure presented from assisted living after possible syncope or near syncope.  On EMS arrival, he was very bradycardic requiring atropine; also noted to be hypothermic in the ED with rectal temperature of 86 and was confused.  Heart rate was in the 40s.  He was started on antibiotics, bair hugger, warmed IV fluids and admitted to ICU under PCCM service.  Code stroke was called because of patient becoming unresponsive with fixed pupils; neurology was consulted.  CT of the brain did not show any acute intracranial abnormity and CTA was negative for LVO or hemodynamically significant stenosis.  EEG was normal.  He subsequently had bradycardic arrest requiring CPR.  He was placed on dopamine drip.  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  He was transferred to [facility] service from 06/06/2022 onwards.  Assessment & Plan:   Complete heart block/symptomatic bradycardia with possible syncope/near syncope -Status post bradycardic arrest x 3 -Status post permanent pacemaker placement on 06/04/2022.  Off dopamine. -EP signed off on 06/05/2022 and recommended outpatient EP follow-up. -Transferred to [facility] service from 06/06/2022 onwards  Hypothermia -Resolved.  Sepsis has been ruled out.  Currently off of antibiotics  Acute encephalopathy in the setting of severe symptomatic bradycardia History of complex partial seizures Intermittent agitation -CT head, CTA head and neck were negative for acute intracranial abnormity.  EEG negative for acute seizure. -Continue divalproex and Topamax.  Patient had an episode of possible seizure on 06/07/2022: Neurology was reconsulted.  EEG negative for active seizures.   -Has had issues with intermittent agitation and had required a dose of Haldol on 06/06/2022. -more awake  this morning  Thrombocytopenia -No signs of bleeding.  Monitor intermittently.  History of unprovoked DVT -Can resume Eliquis from 06/10/2022 onwards  Physical deconditioning -Extremely deconditioned.  Will need SNF placement.  TOC following.  Anemia of chronic disease -From chronic illnesses.  Hemoglobin stable.  Monitor intermittently  Goals of care -Palliative care consultation appreciated.  CODE STATUS changed to DNR   DVT prophylaxis: SCDs Code Status: DNR Family Communication: None at bedside Disposition Plan: Status is: Inpatient Remains inpatient appropriate because: Of severity of illness.  Need for SNF placement.    Consultants: PCCM/neurology/cardiology/EP  Procedures: As above  Antimicrobials:  Anti-infectives (From admission, onward)    Start     Dose/Rate Route Frequency Ordered Stop   06/04/22 2200  ceFAZolin (ANCEF) IVPB 1 g/50 mL premix        1 g 100 mL/hr over 30 Minutes Intravenous Every 8 hours 06/04/22 1642 06/06/22 0936   06/04/22 1145  gentamicin (GARAMYCIN) 80 mg in sodium chloride 0.9 % 500 mL irrigation        80 mg Irrigation On call 06/04/22 1049 06/04/22 1528   06/04/22 1145  ceFAZolin (ANCEF) IVPB 2g/100 mL premix        2 g 200 mL/hr over 30 Minutes Intravenous On call 06/04/22 1049 06/04/22 1517   06/03/22 0000  piperacillin-tazobactam (ZOSYN) IVPB 3.375 g  Status:  Discontinued       See Hyperspace for full Linked Orders Report.   3.375 g 12.5 mL/hr over 240 Minutes Intravenous Every 8 hours 06/02/22 1232 06/04/22 1427   06/02/22 1511  vancomycin (VANCOREADY) IVPB 1750 mg/350 mL  Status:  Discontinued        1,750 mg 175 mL/hr  over 120 Minutes Intravenous Every 24 hours 06/02/22 1512 06/03/22 1454   06/02/22 1245  piperacillin-tazobactam (ZOSYN) IVPB 3.375 g       See Hyperspace for full Linked Orders Report.   3.375 g 100 mL/hr over 30 Minutes Intravenous  Once 06/02/22 1232 06/02/22 1601   06/02/22 1245  vancomycin (VANCOREADY)  IVPB 1750 mg/350 mL  Status:  Discontinued        1,750 mg 175 mL/hr over 120 Minutes Intravenous  Once 06/02/22 1232 06/02/22 1512        Subjective: Patient seen and examined at bedside.  More awake this morning and answers some questions appropriately although still slow to respond.  Patient had an episode of seizure yesterday afternoon as per nursing staff.  No fever, vomiting, worsening shortness of breath reported.  Objective: Vitals:   06/07/22 1528 06/07/22 1942 06/07/22 2306 06/08/22 0500  BP: (!) 121/59 (!) 149/79 130/65 (!) 150/84  Pulse: (!) 59 61 60 60  Resp: '18 20 17 20  '$ Temp: 98.1 F (36.7 C) 98.5 F (36.9 C) 98 F (36.7 C) 98.3 F (36.8 C)  TempSrc: Oral Oral Oral Oral  SpO2: 97% 96% 99% 98%  Weight:    80.5 kg  Height:        Intake/Output Summary (Last 24 hours) at 06/08/2022 0717 Last data filed at 06/08/2022 0503 Gross per 24 hour  Intake 183.09 ml  Output 1150 ml  Net -966.91 ml    Filed Weights   06/04/22 0500 06/06/22 0312 06/08/22 0500  Weight: 82.5 kg 83.6 kg 80.5 kg    Examination:  General: Currently on room air.  No acute distress.  poor historian.  Chronically ill and deconditioned looking ENT/neck: No thyromegaly.  JVD is not elevated  respiratory: Bilateral decreased breath sounds at bases with scattered crackles; no wheezing.  Intermittently tachypneic CVS: S1-S2 heard, rate controlled mostly Abdominal: Soft, nontender, distended mildly; no organomegaly, normal bowel sounds are heard Extremities: Mild lower extremity edema; no cyanosis  CNS: More awake this morning and answers some questions appropriately although still slow to respond.  No focal neurologic deficit.  Moves extremities Lymph: No obvious palpable lymphadenopathy Skin: No obvious petechiae/rashes psych: Extremely flat affect.  Not agitated currently. musculoskeletal: No obvious joint swelling/deformity      Data Reviewed: I have personally reviewed following labs  and imaging studies  CBC: Recent Labs  Lab 06/03/22 0154 06/03/22 0422 06/03/22 2108 06/05/22 0502 06/06/22 0025  WBC 6.4 6.3 8.3 8.8 9.6  NEUTROABS  --  5.3  --  6.8  --   HGB 11.1* 11.6* 11.7* 11.0* 11.3*  HCT 33.2* 33.6* 35.1* 32.1* 33.2*  MCV 94.1 92.3 97.2 95.3 94.6  PLT 163 156 149* 147* 143*    Basic Metabolic Panel: Recent Labs  Lab 06/02/22 2234 06/03/22 0154 06/04/22 0440 06/05/22 0502 06/06/22 0025 06/07/22 0029 06/08/22 0037  NA  --    < > 136 139 138 138 141  K 4.4   < > 3.9 3.5 3.8 4.0 3.5  CL  --    < > 105 106 104 107 108  CO2  --    < > '23 26 25 23 25  '$ GLUCOSE  --    < > 109* 82 100* 86 91  BUN  --    < > '17 18 18 19 20  '$ CREATININE  --    < > 0.93 0.86 0.83 1.03 0.86  CALCIUM  --    < > 8.7* 8.6*  8.6* 8.7* 8.5*  MG 1.6*  --  1.9 1.7 2.0  --  1.9   < > = values in this interval not displayed.    GFR: Estimated Creatinine Clearance: 62 mL/min (by C-G formula based on SCr of 0.86 mg/dL). Liver Function Tests: Recent Labs  Lab 06/03/22 0422 06/08/22 0037  AST 31 35  ALT 26 24  ALKPHOS 60 51  BILITOT 0.8 0.8  PROT 5.7* 5.8*  ALBUMIN 2.9* 2.6*    No results for input(s): "LIPASE", "AMYLASE" in the last 168 hours. Recent Labs  Lab 06/02/22 1650 06/03/22 0422 06/08/22 0037  AMMONIA 43* 42* 69*    Coagulation Profile: Recent Labs  Lab 06/02/22 1650  INR 1.1    Cardiac Enzymes: No results for input(s): "CKTOTAL", "CKMB", "CKMBINDEX", "TROPONINI" in the last 168 hours. BNP (last 3 results) No results for input(s): "PROBNP" in the last 8760 hours. HbA1C: No results for input(s): "HGBA1C" in the last 72 hours. CBG: Recent Labs  Lab 06/03/22 1952 06/07/22 1211  GLUCAP 128* 113*    Lipid Profile: No results for input(s): "CHOL", "HDL", "LDLCALC", "TRIG", "CHOLHDL", "LDLDIRECT" in the last 72 hours. Thyroid Function Tests: No results for input(s): "TSH", "T4TOTAL", "FREET4", "T3FREE", "THYROIDAB" in the last 72 hours. Anemia  Panel: Recent Labs    06/08/22 0037  VITAMINB12 513   Sepsis Labs: Recent Labs  Lab 06/02/22 1131 06/02/22 1348 06/02/22 1650  PROCALCITON  --   --  <0.10  LATICACIDVEN 0.8 0.6  --      Recent Results (from the past 240 hour(s))  Resp panel by RT-PCR (RSV, Flu A&B, Covid) Anterior Nasal Swab     Status: None   Collection Time: 06/02/22 11:03 AM   Specimen: Anterior Nasal Swab  Result Value Ref Range Status   SARS Coronavirus 2 by RT PCR NEGATIVE NEGATIVE Final    Comment: (NOTE) SARS-CoV-2 target nucleic acids are NOT DETECTED.  The SARS-CoV-2 RNA is generally detectable in upper respiratory specimens during the acute phase of infection. The lowest concentration of SARS-CoV-2 viral copies this assay can detect is 138 copies/mL. A negative result does not preclude SARS-Cov-2 infection and should not be used as the sole basis for treatment or other patient management decisions. A negative result may occur with  improper specimen collection/handling, submission of specimen other than nasopharyngeal swab, presence of viral mutation(s) within the areas targeted by this assay, and inadequate number of viral copies(<138 copies/mL). A negative result must be combined with clinical observations, patient history, and epidemiological information. The expected result is Negative.  Fact Sheet for Patients:  EntrepreneurPulse.com.au  Fact Sheet for Healthcare Providers:  IncredibleEmployment.be  This test is no t yet approved or cleared by the [place] FDA and  has been authorized for detection and/or diagnosis of SARS-CoV-2 by FDA under an Emergency Use Authorization (EUA). This EUA will remain  in effect (meaning this test can be used) for the duration of the COVID-19 declaration under Section 564(b)(1) of the Act, 21 U.S.C.section 360bbb-3(b)(1), unless the authorization is terminated  or revoked sooner.       Influenza A by PCR  NEGATIVE NEGATIVE Final   Influenza B by PCR NEGATIVE NEGATIVE Final    Comment: (NOTE) The Xpert Xpress SARS-CoV-2/FLU/RSV plus assay is intended as an aid in the diagnosis of influenza from Nasopharyngeal swab specimens and should not be used as a sole basis for treatment. Nasal washings and aspirates are unacceptable for Xpert Xpress SARS-CoV-2/FLU/RSV testing.  Fact Sheet for  Patients: EntrepreneurPulse.com.au  Fact Sheet for Healthcare Providers: IncredibleEmployment.be  This test is not yet approved or cleared by the [place] FDA and has been authorized for detection and/or diagnosis of SARS-CoV-2 by FDA under an Emergency Use Authorization (EUA). This EUA will remain in effect (meaning this test can be used) for the duration of the COVID-19 declaration under Section 564(b)(1) of the Act, 21 U.S.C. section 360bbb-3(b)(1), unless the authorization is terminated or revoked.     Resp Syncytial Virus by PCR NEGATIVE NEGATIVE Final    Comment: (NOTE) Fact Sheet for Patients: EntrepreneurPulse.com.au  Fact Sheet for Healthcare Providers: IncredibleEmployment.be  This test is not yet approved or cleared by the [place] FDA and has been authorized for detection and/or diagnosis of SARS-CoV-2 by FDA under an Emergency Use Authorization (EUA). This EUA will remain in effect (meaning this test can be used) for the duration of the COVID-19 declaration under Section 564(b)(1) of the Act, 21 U.S.C. section 360bbb-3(b)(1), unless the authorization is terminated or revoked.  Performed at [facility], [place] [address], [place], [place] [address]   Blood culture (routine x 2)     Status: None   Collection Time: 06/02/22 11:31 AM   Specimen: BLOOD RIGHT ARM  Result Value Ref Range Status   Specimen Description BLOOD RIGHT ARM  Final   Special Requests   Final    BOTTLES DRAWN AEROBIC AND ANAEROBIC  Blood Culture adequate volume   Culture   Final    NO GROWTH 5 DAYS Performed at [facility], [place] [address], [place], [place] [address]    Report Status 06/07/2022 FINAL  Final  Blood culture (routine x 2)     Status: None   Collection Time: 06/02/22 11:59 AM   Specimen: BLOOD RIGHT HAND  Result Value Ref Range Status   Specimen Description BLOOD RIGHT HAND  Final   Special Requests   Final    BOTTLES DRAWN AEROBIC AND ANAEROBIC Blood Culture results may not be optimal due to an inadequate volume of blood received in culture bottles   Culture   Final    NO GROWTH 5 DAYS Performed at [facility], [place] [address], [place], [place] [address]    Report Status 06/07/2022 FINAL  Final  MRSA Next Gen by PCR, Nasal     Status: None   Collection Time: 06/02/22  2:51 PM   Specimen: Nasal Mucosa; Nasal Swab  Result Value Ref Range Status   MRSA by PCR Next Gen NOT DETECTED NOT DETECTED Final    Comment: (NOTE) The GeneXpert MRSA Assay (FDA approved for NASAL specimens only), is one component of a comprehensive MRSA colonization surveillance program. It is not intended to diagnose MRSA infection nor to guide or monitor treatment for MRSA infections. Test performance is not FDA approved in patients less than 66 years old. Performed at [facility], [place] [address] [place], [place] [address]   Surgical PCR screen     Status: Abnormal   Collection Time: 06/04/22 10:52 AM   Specimen: Nasal Mucosa; Nasal Swab  Result Value Ref Range Status   MRSA, PCR NEGATIVE NEGATIVE Final   Staphylococcus aureus POSITIVE (A) NEGATIVE Final    Comment: (NOTE) The Xpert SA Assay (FDA approved for NASAL specimens in patients 29 years of age and older), is one component of a comprehensive surveillance program. It is not intended to diagnose infection nor to guide or monitor treatment. Performed at [facility], [address], [place], [place] [address]          Radiology  Studies: EEG adult  Result Date: 20-Jun-2022 Lora Havens, MD     2022/06/20  5:10 PM Patient Name: CORT DRAGOO MRN: [id number] Epilepsy Attending: Lora Havens Referring Physician/Provider: Aline August, MD Date: 2022-06-20 Duration: 23.08 mins Patient history: 89yo M with ams. EEG to evaluate for seizure Level of alertness: Awake AEDs during EEG study: VPA, TPM Technical aspects: This EEG study was done with scalp electrodes positioned according to the 10-20 International system of electrode placement. Electrical activity was reviewed with band pass filter of 1-'70Hz'$ , sensitivity of 7 uV/mm, display speed of 30m/sec with a '60Hz'$  notched filter applied as appropriate. EEG data were recorded continuously and digitally stored.  Video monitoring was available and reviewed as appropriate. Description: The posterior dominant rhythm consists of 9 Hz activity of moderate voltage (25-35 uV) seen predominantly in posterior head regions, symmetric and reactive to eye opening and eye closing. EEG showed intermittent generalized 3 to 6 Hz theta-delta slowing. Hyperventilation and photic stimulation were not performed.   ABNORMALITY - Intermittent slow, generalized IMPRESSION: This study is suggestive of mild diffuse encephalopathy, nonspecific etiology. No seizures or epileptiform discharges were seen throughout the recording. Priyanka OBarbra Sarks       Scheduled Meds:  Chlorhexidine Gluconate Cloth  6 each Topical Q0600   divalproex  750 mg Oral Daily   docusate sodium  100 mg Oral BID   folic acid  1 mg Oral Daily   leptospermum manuka honey  1 Application Topical Daily   mupirocin ointment  1 Application Nasal BID   polyethylene glycol  17 g Oral Daily   sodium chloride flush  3 mL Intravenous Q12H   sodium chloride flush  3 mL Intravenous Q12H   topiramate  50 mg Oral QHS   Continuous Infusions:  sodium chloride            KAline August MD [facility] 06/08/2022, 7:17 AM

## 2022-06-08 NOTE — Evaluation (Signed)
Clinical/Bedside Swallow Evaluation Patient Details  Name: Kenneth Bowen MRN: [id number] Date of Birth: [birth year]  Today's Date: 06/08/2022 Time:        Past Medical History:  Past Medical History:  Diagnosis Date   Colon polyps    Diverticulosis    Prostate cancer [facility]) 2015   radiation finished Nov 2015   Seizures ([place]) 07/2016   Past Surgical History:  Past Surgical History:  Procedure Laterality Date   colonscopy     INGUINAL HERNIA REPAIR     KNEE SURGERY     PACEMAKER IMPLANT N/A 06/04/2022   Procedure: PACEMAKER IMPLANT;  Surgeon: Evans Lance, MD;  Location: [place] [facility];  Service: Cardiovascular;  Laterality: N/A;   PROSTATE BIOPSY     TEMPORARY PACEMAKER N/A 06/03/2022   Procedure: TEMPORARY PACEMAKER;  Surgeon: Jolaine Artist, MD;  Location: [place] [facility];  Service: Cardiovascular;  Laterality: N/A;   HPI:  89 yo male presents to [facility] on 1/20 from ALF with fall due to syncopal episode, bradycardia, encephalopathy. Period of unresponsiveness with bradycardia in ED, dopamine initiated, period of asystole with return of HR after CPR compressions initiated. Temporary pacer on 1/21, s/p PPM 1/22. Pt had a seizure-like episode 1/25, after which he was lethargic and confused. MBS completed in 2014 as part of w/u for cough. Oropharyngeal swallow was [facility], although it was noted that he had suspected osteophytes (C5-6, C6-7) and slow transit of barium tablet through esophagus. PMH includes complex partial seizures, prostate cancer, RLS, DVT.    Assessment / Plan / Recommendation  Clinical Impression  [name] mentation is improved today per nurse, although pt reports that he does not recall any events from previous date and he has some forgetfulness noted within swallow eval today. Pt did have an immediate cough x1 with thin liquids, otherwise noted to have a delayed throat clear x1 but no other signs concerning for dysphagia. Note that prior swallow testing as part  of work up for coughing revealed normal swallow function in spite of presence of throat clearing. Given significant improvements noted by RN in mentation today, as well as report of no overt difficulties when he had breakfast with staff this morning, will leave on current diet. SLP will plan to follow up given risk factors. SLP Visit Diagnosis: Dysphagia, unspecified (R13.10)    Aspiration Risk  Mild aspiration risk    Diet Recommendation Regular;Thin liquid   Liquid Administration via: Cup;Straw Medication Administration: Whole meds with liquid Supervision: Patient able to self feed;Intermittent supervision to cue for compensatory strategies Compensations: Slow rate;Small sips/bites;Minimize environmental distractions Postural Changes: Seated upright at 90 degrees    Other  Recommendations Oral Care Recommendations: Oral care BID    Recommendations for follow up therapy are one component of a multi-disciplinary discharge planning process, led by the attending physician.  Recommendations may be updated based on patient status, additional functional criteria and insurance authorization.  Follow up Recommendations Skilled nursing-short term rehab (<3 hours/day)      Assistance Recommended at Discharge    Functional Status Assessment Patient has had a recent decline in their functional status and demonstrates the ability to make significant improvements in function in a reasonable and predictable amount of time.  Frequency and Duration min 2x/week  2 weeks       Prognosis        Swallow Study   General HPI: 89 yo male presents to [facility] on 1/20 from ALF with fall due to syncopal episode,  bradycardia, encephalopathy. Period of unresponsiveness with bradycardia in ED, dopamine initiated, period of asystole with return of HR after CPR compressions initiated. Temporary pacer on 1/21, s/p PPM 1/22. Pt had a seizure-like episode 1/25, after which he was lethargic and confused. MBS completed in  2014 as part of w/u for cough. Oropharyngeal swallow was [facility], although it was noted that he had suspected osteophytes (C5-6, C6-7) and slow transit of barium tablet through esophagus. PMH includes complex partial seizures, prostate cancer, RLS, DVT. Type of Study: Bedside Swallow Evaluation Previous Swallow Assessment: see HPI Diet Prior to this Study: Regular;Thin liquids Temperature Spikes Noted: No Respiratory Status: Nasal cannula History of Recent Intubation: No Behavior/Cognition: Alert;Cooperative;Pleasant mood;Requires cueing Oral Cavity Assessment: Within Functional Limits Oral Care Completed by SLP: No Oral Cavity - Dentition: Adequate natural dentition Vision: Functional for self-feeding Self-Feeding Abilities: Able to feed self Patient Positioning: Upright in bed Baseline Vocal Quality: Normal Volitional Cough: Strong Volitional Swallow: Able to elicit    Oral/Motor/Sensory Function Overall Oral Motor/Sensory Function: Within functional limits   Ice Chips Ice chips: Not tested   Thin Liquid Thin Liquid: Impaired Presentation: Cup;Self Fed;Straw Pharyngeal  Phase Impairments: Cough - Immediate;Throat Clearing - Delayed    Nectar Thick Nectar Thick Liquid: Not tested   Honey Thick Honey Thick Liquid: Not tested   Puree Puree: Within functional limits Presentation: Self Fed;Spoon   Solid     Solid: Within functional limits Presentation: Self Fed      Osie Bond., M.A. [place] Office [phone]  Secure chat preferred  06/08/2022,10:58 AM

## 2022-06-08 NOTE — TOC Progression Note (Signed)
Transition of Care [facility]) - Progression Note    Patient Details  Name: Kenneth Bowen MRN: [id number] Date of Birth: [birth year]  Transition of Care [facility]) CM/SW [place], LCSW Phone Number: 06/08/2022, 1:07 PM  Clinical Narrative:    [facility] contacted CSW in regards to pt seizures. SNF wanted to know if pt seizures are under control and if pt would to tolerate STR.   CSW contacted RN to learn more of pt status. RN informed CSW that pt has not had a seizure since yesterday and has been doing well medically today. RN notified CSW that MD states pt can dc tomorrow if he is seizure free and cleared by nephrology. CSW communicated this information with [facility] SN[facility] and they did extend a bed offer to pt.   CSW called wife to inform her that [facility] would like to offer a bed to pt. Wife accepted bed offer and would like for pt to dc to SNF for STR. She mentioned that [facility] was one of their main preferences. CSW will start insurance auth. TOC will continue to follow.    Expected Discharge Plan: [place] Barriers to Discharge: Continued Medical Work up  Expected Discharge Plan and [place] arrangements for the past 2 months: [place]                                       Social Determinants of Health (SDOH) Interventions SDOH Screenings   Food Insecurity: No Food Insecurity (06/02/2022)  Housing: Low Risk  (06/02/2022)  Transportation Needs: No Transportation Needs (06/02/2022)  Utilities: Not At Risk (06/02/2022)  Tobacco Use: Low Risk  (06/05/2022)    Readmission Risk Interventions     No data to display         Beckey Rutter, MSW, LCSWA, LCASA Transitions of Care  Clinical Social Worker I

## 2022-06-08 NOTE — Progress Notes (Addendum)
Mobility Specialist Progress Note    06/08/22 1139  Mobility  Activity Stood at bedside  Level of Assistance +2 (takes two people) Personal assistant)  Assistive Device Front wheel walker  Activity Response Tolerated fair  Mobility Referral Yes  $Mobility charge 1 Mobility   Pre-Mobility: 64 HR, 129/72 (91) BP, 94% SpO2 Post-Mobility: 62 HR, 94% SpO2  Pt received in bed and agreeable. Pt modA+1 for bed mobility. Pt modA+2 to stand. Pt unable to fully extend hips and had posterior lean. Returned to sitting EOB. Attempted x1 more STS and pt with significant posterior lean c/o back pain. Returned to supine. Left with call bell in reach and bed alarm on.  Hildred Alamin Mobility Specialist  Please Contact via SecureChat or Rehab Office at [phone]

## 2022-06-08 NOTE — Progress Notes (Signed)
Mobility Specialist Progress Note    06/08/22 1617  Mobility  Activity Transferred from chair to bed  Level of Assistance +2 (takes two people) Personal assistant)  Assistive Device Front wheel walker  Distance Ambulated (ft) 4 ft  Activity Response Tolerated well  Mobility Referral Yes  $Mobility charge 1 Mobility   Pt received and agreeable. No complaints. Pt modA+2 to stand and modA+1 for pivotal and lateral steps to [facility]. Left with call bell in reach.   Hildred Alamin Mobility Specialist  Please Psychologist, sport and exercise or Rehab Office at [phone]

## 2022-06-09 DIAGNOSIS — Z515 Encounter for palliative care: Secondary | ICD-10-CM | POA: Diagnosis not present

## 2022-06-09 DIAGNOSIS — T68XXXA Hypothermia, initial encounter: Secondary | ICD-10-CM | POA: Diagnosis not present

## 2022-06-09 DIAGNOSIS — R001 Bradycardia, unspecified: Secondary | ICD-10-CM | POA: Diagnosis not present

## 2022-06-09 DIAGNOSIS — G9340 Encephalopathy, unspecified: Secondary | ICD-10-CM | POA: Diagnosis not present

## 2022-06-09 DIAGNOSIS — I442 Atrioventricular block, complete: Secondary | ICD-10-CM | POA: Diagnosis not present

## 2022-06-09 MED ORDER — FOLIC ACID 1 MG PO TABS: 1.0000 mg | ORAL_TABLET | Freq: Every day | ORAL | 0 refills | Status: DC

## 2022-06-09 MED ORDER — DIVALPROEX SODIUM ER 250 MG PO TB24: 750.0000 mg | ORAL_TABLET | Freq: Every day | ORAL | 0 refills | Status: DC

## 2022-06-09 MED ORDER — LACTULOSE 10 GM/15ML PO SOLN: 10.0000 g | Freq: Two times a day (BID) | ORAL | 0 refills | Status: DC

## 2022-06-09 MED ORDER — APIXABAN 2.5 MG PO TABS: 2.5000 mg | ORAL_TABLET | Freq: Two times a day (BID) | ORAL | Status: DC

## 2022-06-09 NOTE — TOC Transition Note (Signed)
Transition of Care [facility]) - CM/SW Discharge Note   Patient Details  Name: Kenneth Bowen MRN: [id number] Date of Birth: December 23, 1930  Transition of Care [facility]) CM/SW Contact:  [address], [place] Phone Number: 06/09/2022, 12:18 PM   Clinical Narrative:     Patient will DC to: [facility] Anticipated DC date: 06/09/22 Family notified: Arlyss Queen Transport by: Corey Harold   Per MD patient ready for DC to [facility]. RN to call report prior to discharge [phone]. RN, patient, patient's family, and facility notified of DC. Discharge Summary and FL2 sent to facility. DC packet on chart. Ambulance transport requested for patient.   CSW will sign off for now as social work intervention is no longer needed. Please consult [place] again if new needs arise.    Final next level of care: [place] Barriers to Discharge: Continued Medical Work up   Patient Goals and CMS Choice      Discharge Placement                         Discharge Plan and Services Additional resources added to the After Visit Summary for                                       Social Determinants of Health (SDOH) Interventions SDOH Screenings   Food Insecurity: No Food Insecurity (06/02/2022)  Housing: Low Risk  (06/02/2022)  Transportation Needs: No Transportation Needs (06/02/2022)  Utilities: Not At Risk (06/02/2022)  Tobacco Use: Low Risk  (06/05/2022)     Readmission Risk Interventions     No data to display

## 2022-06-09 NOTE — Discharge Summary (Signed)
Physician Discharge Summary  Kenneth Bowen INO:[id number] DOB: Mar 06, 1931 DOA: 06/02/2022  PCP: Sueanne Margarita, DO  Admit date: 06/02/2022 Discharge date: 06/09/2022  Admitted From: ALF Disposition: SNF  Recommendations for Outpatient Follow-up:  Follow up with SNF provider at earliest convenience Outpatient follow-up with neurology and cardiology Recommend outpatient follow-up with palliative care if condition where to worsen Follow up in ED if symptoms worsen or new appear   Home Health: No Equipment/Devices: Permanent pacemaker placement on 06/04/2022  Discharge Condition: Guarded CODE STATUS: DNR  diet recommendation: Heart healthy  Brief/Interim Summary: 89 year old male with history of unprovoked DVT, prostate cancer, seizure presented from assisted living after possible syncope or near syncope. On EMS arrival, he was very bradycardic requiring atropine; also noted to be hypothermic in the ED with rectal temperature of 86 and was confused. Heart rate was in the 40s. He was started on antibiotics, bair hugger, warmed IV fluids and admitted to ICU under PCCM service. Code stroke was called because of patient becoming unresponsive with fixed pupils; neurology was consulted. CT of the brain did not show any acute intracranial abnormity and CTA was negative for LVO or hemodynamically significant stenosis. EEG was normal. He subsequently had bradycardic arrest requiring CPR. He was placed on dopamine drip. 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 He was transferred to [facility] service from 06/06/2022 onwards.  Subsequently, he had an episode of seizure on 06/07/2022: Neurology was consulted.  EEG negative for active seizures.  Depakote dose was increased by neurology.  Subsequently no more seizures and neurology recommends to follow Depakote level on 06/10/2022 which can be done at [facility].  PT recommended SNF placement.  He will be discharged to SNF once bed is available.  Palliative care  has evaluated the patient and CODE STATUS has been changed to DNR.  Discharge Diagnoses:   Complete heart block/symptomatic bradycardia with possible syncope/near syncope -Status post bradycardic arrest x 3 -Status post permanent pacemaker placement on 06/04/2022.  Off dopamine. -EP signed off on 06/05/2022 and recommended outpatient EP follow-up. -Transferred to [facility] service from 06/06/2022 onwards   Hypothermia -Resolved.  Sepsis has been ruled out.  Currently off of antibiotics   Acute encephalopathy in the setting of severe symptomatic bradycardia History of complex partial seizures Intermittent agitation -CT head, CTA head and neck were negative for acute intracranial abnormity.  EEG negative for acute seizure. -Currently on divalproex and Topamax.  Patient had an episode of possible seizure on 06/07/2022: Neurology was reconsulted.  EEG negative for active seizures.   -Depakote dose was increased by neurology.  Subsequently no more seizures and mental status has remained stable.  Neurology recommends to follow Depakote level on 06/10/2022 which can be done at [facility].  Outpatient follow-up with neurology. -Also had slightly elevated pneumonia for which he has been started on lactulose which will be continued on discharge   Thrombocytopenia -Mild.  No signs of bleeding.  Can be followed up as an outpatient.  History of unprovoked DVT -Can resume Eliquis from 06/10/2022 onwards   Physical deconditioning -Extremely deconditioned.  Will need SNF placement.     Anemia of chronic disease -From chronic illnesses.  Hemoglobin stable.  Monitor intermittently as an outpatient   Goals of care -Palliative care consultation appreciated.  CODE STATUS changed to DNR   Discharge Instructions  Discharge Instructions     Ambulatory referral to Cardiac Electrophysiology   Complete by: As directed    Ambulatory referral to Neurology   Complete by: As directed  An appointment is requested in  approximately: 2 weeks   Diet - low sodium heart healthy   Complete by: As directed    Discharge wound care:   Complete by: As directed    Wound care to bilateral posterior elbows, right lateral lower LE and left lateral foot full thickness wounds:  Cleanse with NS, pat dry. Apply a thin layer of MediHoney to wounds, top with ingle layer of dry gauze, top/secure with silicone foam. Change daily.   Increase activity slowly   Complete by: As directed       Allergies as of 06/09/2022   No Known Allergies      Medication List     STOP taking these medications    acetaminophen 500 MG tablet Commonly known as: TYLENOL   ALPRAZolam 0.5 MG tablet Commonly known as: XANAX   traZODone 50 MG tablet Commonly known as: DESYREL       TAKE these medications    apixaban 2.5 MG Tabs tablet Commonly known as: Eliquis Take 1 tablet (2.5 mg total) by mouth 2 (two) times daily. Resume from 06/10/22 Start taking on: June 10, 2022 What changed: additional instructions   ARTIFICIAL TEAR OP Place 1 drop into both eyes daily as needed (dry eye).   cholecalciferol 1000 units tablet Commonly known as: VITAMIN D Take 2,000 Units by mouth daily.   desoximetasone 0.25 % cream Commonly known as: TOPICORT Apply 1 Application topically at bedtime.   divalproex 250 MG 24 hr tablet Commonly known as: DEPAKOTE ER Take 3 tablets (750 mg total) by mouth daily. What changed:  medication strength how much to take   folic acid 1 MG tablet Commonly known as: FOLVITE Take 1 tablet (1 mg total) by mouth daily. Start taking on: June 10, 2022   furosemide 40 MG tablet Commonly known as: LASIX Take 40 mg by mouth daily.   hydrocortisone 2.5 % cream Apply 1 Application topically 2 (two) times daily as needed for other (Skin irritation).   IRON PO Take 1 tablet by mouth daily.   lactulose 10 GM/15ML solution Commonly known as: CHRONULAC Take 15 mLs (10 g total) by mouth 2 (two) times  daily.   mometasone 0.1 % ointment Commonly known as: ELOCON Apply 1 application topically 2 (two) times daily as needed (rash).   multivitamin tablet Take 1 tablet by mouth daily.   omeprazole 20 MG capsule Commonly known as: PRILOSEC Take 20 mg by mouth daily.   tamsulosin 0.4 MG Caps capsule Commonly known as: FLOMAX TAKE 1 CAPSULE (0.4 MG TOTAL) BY MOUTH DAILY AFTER SUPPER. What changed: when to take this   topiramate 50 MG tablet Commonly known as: TOPAMAX Take 1 tablet (50 mg total) by mouth at bedtime.               Discharge Care Instructions  (From admission, onward)           Start     Ordered   06/09/22 0000  Discharge wound care:       Comments: Wound care to bilateral posterior elbows, right lateral lower LE and left lateral foot full thickness wounds:  Cleanse with NS, pat dry. Apply a thin layer of MediHoney to wounds, top with ingle layer of dry gauze, top/secure with silicone foam. Change daily.   06/09/22 1002            No Known Allergies  Consultations: PCCM/neurology/cardiology/EP/palliative care   Procedures/Studies: EEG adult  Result Date: 24-Jun-2022 Lora Havens,  MD     06/07/2022  5:10 PM Patient Name: Kenneth Bowen MRN: [id number] Epilepsy Attending: Lora Havens Referring Physician/Provider: Aline August, MD Date: 06/07/2022 Duration: 23.08 mins Patient history: 89yo M with ams. EEG to evaluate for seizure Level of alertness: Awake AEDs during EEG study: VPA, TPM Technical aspects: This EEG study was done with scalp electrodes positioned according to the 10-20 International system of electrode placement. Electrical activity was reviewed with band pass filter of 1-'70Hz'$ , sensitivity of 7 uV/mm, display speed of 57m/sec with a '60Hz'$  notched filter applied as appropriate. EEG data were recorded continuously and digitally stored.  Video monitoring was available and reviewed as appropriate. Description: The posterior dominant  rhythm consists of 9 Hz activity of moderate voltage (25-35 uV) seen predominantly in posterior head regions, symmetric and reactive to eye opening and eye closing. EEG showed intermittent generalized 3 to 6 Hz theta-delta slowing. Hyperventilation and photic stimulation were not performed.   ABNORMALITY - Intermittent slow, generalized IMPRESSION: This study is suggestive of mild diffuse encephalopathy, nonspecific etiology. No seizures or epileptiform discharges were seen throughout the recording. PLora Havens  DG Chest Port 1 View  Result Date: 06/05/2022 CLINICAL DATA:  Pacemaker placement EXAM: PORTABLE CHEST 1 VIEW COMPARISON:  06/04/2022 FINDINGS: Dual lead pacemaker placed from a left subclavian approach. Leads in the region the right atrium and right ventricle. No pneumothorax. Mild patchy atelectasis at the lung bases persists. Right internal jugular venous access sheath remains. IMPRESSION: Dual lead pacemaker placed from a left subclavian approach. No pneumothorax. Electronically Signed   By: MNelson ChimesM.D.   On: 06/05/2022 08:21   EP PPM/ICD IMPLANT  Result Date: 06/04/2022 CONCLUSIONS:  1. Successful implantation of a [employer] dual-chamber pacemaker for symptomatic bradycardia due to complete heart block  2. No early apparent complications.       GCristopher Peru MD 06/04/2022 8:51 PM  DG CHEST PORT 1 VIEW  Result Date: 06/04/2022 CLINICAL DATA:  Acute on chronic respiratory failure EXAM: PORTABLE CHEST 1 VIEW COMPARISON:  None Available. FINDINGS: Transvenous pacing wire with tip projecting over the ventricles. No pneumothorax. No pulmonary edema. LEFT basilar atelectasis. IMPRESSION: 1. Transvenous pacing wire in place. 2. LEFT basilar atelectasis. Electronically Signed   By: SSuzy BouchardM.D.   On: 06/04/2022 09:15   CARDIAC CATHETERIZATION  Result Date: 06/03/2022 Successful TVP. PPM tomorrow per EP. DGlori Bickers MD 7:38 PM  EEG adult  Result Date:  06/02/2022 YLora Havens MD     06/03/2022  7:56 AM Patient Name: DDILLON LIVERMOREMRN: [id number] Attending: PLora HavensReferring Physician/Provider: BCristal Generous NP Date: 04/03/2023 Duration: 22.42 mins Patient history: 89yo M PMH complex partial seizures who presented to ED 1/20 from assisted living after possible syncope or near syncope. EEG to evaluate for seizure. Level of alertness: Awake AEDs during EEG study: None Technical aspects: This EEG study was done with scalp electrodes positioned according to the 10-20 International system of electrode placement. Electrical activity was reviewed with band pass filter of 1-'70Hz'$ , sensitivity of 7 uV/mm, display speed of 383msec with a '60Hz'$  notched filter applied as appropriate. EEG data were recorded continuously and digitally stored.  Video monitoring was available and reviewed as appropriate. Description: The posterior dominant rhythm consists of 9 Hz activity of moderate voltage (25-35 uV) seen predominantly in posterior head regions, symmetric and reactive to eye opening and eye closing.  There is an excessive amount of 15 to 18 Hz  beta activity distributed symmetrically and diffusely.  Hyperventilation and photic stimulation were not performed.   ABNORMALITY - Excessive beta, generalized IMPRESSION: This study is within normal limits. The excessive beta activity seen in the background is most likely due to the effect of benzodiazepine and is a benign EEG pattern. No seizures or epileptiform discharges were seen throughout the recording. A normal interictal EEG does not exclude the diagnosis of epilepsy. Priyanka Barbra Sarks   CT ANGIO HEAD NECK W WO CM W PERF (CODE STROKE)  Result Date: 06/02/2022 CLINICAL DATA:  Neuro deficit, acute, stroke suspected pupils fixed, dilated, R gaze preference, no movement to noxious stimuli bilat EXAM: CT ANGIOGRAPHY HEAD AND NECK CT PERFUSION BRAIN TECHNIQUE: Multidetector CT imaging of the head and neck  was performed using the standard protocol during bolus administration of intravenous contrast. Multiplanar CT image reconstructions and MIPs were obtained to evaluate the vascular anatomy. Carotid stenosis measurements (when applicable) are obtained utilizing NASCET criteria, using the distal internal carotid diameter as the denominator. Multiphase CT imaging of the brain was performed following IV bolus contrast injection. Subsequent parametric perfusion maps were calculated using RAPID software. RADIATION DOSE REDUCTION: This exam was performed according to the departmental dose-optimization program which includes automated exposure control, adjustment of the mA and/or kV according to patient size and/or use of iterative reconstruction technique. CONTRAST:  123m OMNIPAQUE IOHEXOL 350 MG/ML SOLN COMPARISON:  Same day CT head.  CTA head/neck July 13, 2016. FINDINGS: CTA NECK FINDINGS Aortic arch: Great vessel origins are patent. Right carotid system: Atherosclerosis at the carotid bifurcation without greater than 50% stenosis. Left carotid system: Atherosclerosis at the carotid bifurcation without greater than 50% stenosis Vertebral arteries: Left dominant. Both vertebral arteries are patent without greater than 50% stenosis Skeleton: Severe multilevel degenerative change. Other neck: No acute abnormality on limited assessment. Upper chest: No consolidation the visualized lung apices. Review of the MIP images confirms the above findings CTA HEAD FINDINGS Anterior circulation: Bilateral intracranial ICAs are patent with mild for age narrowing due to atherosclerosis. Bilateral MCAs and ACAs are patent without proximal hemodynamically significant stenosis. Posterior circulation: Bilateral intradural vertebral arteries, basilar artery and bilateral posterior cerebral arteries are patent without proximal hemodynamically significant stenosis. Venous sinuses: As permitted by contrast timing, patent. Small right  transverse/sigmoid sinus, similar to the prior. Review of the MIP images confirms the above findings CT Brain Perfusion Findings: ASPECTS: 10 CBF (<30%) Volume: 024mPerfusion (Tmax>6.0s) volume: 34m734mismatch Volume: 34mL334mfarction Location:None identified. IMPRESSION: 1. No large vessel occlusion or proximal hemodynamically significant stenosis. 2. Small (5 mL) area of penumbra reported in the left posterior/inferior temporal region, possibly artifactual given streak artifact in this region. Findings discussed with Dr. StacQuinn Axe telephone at 1:24 p.m. Electronically Signed   By: FredMargaretha Sheffield.   On: 06/02/2022 13:32   CT HEAD CODE STROKE WO CONTRAST  Result Date: 06/02/2022 CLINICAL DATA:  Code stroke.  Neuro deficit, acute, stroke suspected EXAM: CT HEAD WITHOUT CONTRAST TECHNIQUE: Contiguous axial images were obtained from the base of the skull through the vertex without intravenous contrast. RADIATION DOSE REDUCTION: This exam was performed according to the departmental dose-optimization program which includes automated exposure control, adjustment of the mA and/or kV according to patient size and/or use of iterative reconstruction technique. COMPARISON:  CT head July 28, 2016. FINDINGS: Brain: No evidence of acute infarction, hemorrhage, hydrocephalus, extra-axial collection or mass lesion/mass effect. Similar patchy white matter hypodensities, compatible with chronic microvascular ischemic disease. Vascular: Calcific atherosclerosis.  No hyperdense vessel. Skull: No acute fracture. Sinuses/Orbits: Clear sinuses.  No acute orbital findings. Other: No mastoid effusions. ASPECTS [facility] Stroke Program Early CT Score) Total score (0-10 with 10 being normal): 10. IMPRESSION: 1. No evidence of acute intracranial abnormality. 2. ASPECTS is 10. Code stroke imaging results were communicated on 06/02/2022 at 1:10 pm to provider STACK via secure text paging. Electronically Signed   By: Margaretha Sheffield M.D.    On: 06/02/2022 13:11   DG Chest Portable 1 View  Result Date: 06/02/2022 CLINICAL DATA:  Altered mental status.  Aspiration.  Infection. EXAM: PORTABLE CHEST 1 VIEW COMPARISON:  07/21/2019 FINDINGS: Extensive artifact overlies the chest. Heart size is normal. Mediastinal shadows appear normal. Poor inspiratory effort. Minimal patchy density at the lung bases could relate to the poor inspiration or could indicate mild basilar pneumonia. No lobar consolidation or collapse. No pulmonary edema or pleural effusion. IMPRESSION: Poor inspiratory effort. Minimal patchy density at the lung bases could relate to the poor inspiration or could indicate mild basilar pneumonia. Electronically Signed   By: Nelson Chimes M.D.   On: 06/02/2022 11:50      Subjective: Patient seen and examined at bedside.  No fever, vomiting, seizures or abdominal pain reported.  Discharge Exam: Vitals:   06/09/22 0537 06/09/22 0734  BP:  (!) 162/70  Pulse: (!) 59 72  Resp: (!) 22 20  Temp:  97.9 F (36.6 C)  SpO2: 94% 96%    General: Pt is alert, awake, not in acute distress.  Elderly male lying in bed.  On room air.  Slow to respond but answers questions appropriately. Cardiovascular: rate controlled currently, S1/S2 + Respiratory: bilateral decreased breath sounds at bases with some scattered crackles Abdominal: Soft, NT, ND, bowel sounds + Extremities: Trace lower extremity edema present; no cyanosis    The results of significant diagnostics from this hospitalization (including imaging, microbiology, ancillary and laboratory) are listed below for reference.     Microbiology: Recent Results (from the past 240 hour(s))  Resp panel by RT-PCR (RSV, Flu A&B, Covid) Anterior Nasal Swab     Status: None   Collection Time: 06/02/22 11:03 AM   Specimen: Anterior Nasal Swab  Result Value Ref Range Status   SARS Coronavirus 2 by RT PCR NEGATIVE NEGATIVE Final    Comment: (NOTE) SARS-CoV-2 target nucleic acids are NOT  DETECTED.  The SARS-CoV-2 RNA is generally detectable in upper respiratory specimens during the acute phase of infection. The lowest concentration of SARS-CoV-2 viral copies this assay can detect is 138 copies/mL. A negative result does not preclude SARS-Cov-2 infection and should not be used as the sole basis for treatment or other patient management decisions. A negative result may occur with  improper specimen collection/handling, submission of specimen other than nasopharyngeal swab, presence of viral mutation(s) within the areas targeted by this assay, and inadequate number of viral copies(<138 copies/mL). A negative result must be combined with clinical observations, patient history, and epidemiological information. The expected result is Negative.  Fact Sheet for Patients:  EntrepreneurPulse.com.au  Fact Sheet for Healthcare Providers:  IncredibleEmployment.be  This test is no t yet approved or cleared by the [place] FDA and  has been authorized for detection and/or diagnosis of SARS-CoV-2 by FDA under an Emergency Use Authorization (EUA). This EUA will remain  in effect (meaning this test can be used) for the duration of the COVID-19 declaration under Section 564(b)(1) of the Act, 21 U.S.C.section 360bbb-3(b)(1), unless the authorization is terminated  or  revoked sooner.       Influenza A by PCR NEGATIVE NEGATIVE Final   Influenza B by PCR NEGATIVE NEGATIVE Final    Comment: (NOTE) The Xpert Xpress SARS-CoV-2/FLU/RSV plus assay is intended as an aid in the diagnosis of influenza from Nasopharyngeal swab specimens and should not be used as a sole basis for treatment. Nasal washings and aspirates are unacceptable for Xpert Xpress SARS-CoV-2/FLU/RSV testing.  Fact Sheet for Patients: EntrepreneurPulse.com.au  Fact Sheet for Healthcare Providers: IncredibleEmployment.be  This test is not yet  approved or cleared by the [place] FDA and has been authorized for detection and/or diagnosis of SARS-CoV-2 by FDA under an Emergency Use Authorization (EUA). This EUA will remain in effect (meaning this test can be used) for the duration of the COVID-19 declaration under Section 564(b)(1) of the Act, 21 U.S.C. section 360bbb-3(b)(1), unless the authorization is terminated or revoked.     Resp Syncytial Virus by PCR NEGATIVE NEGATIVE Final    Comment: (NOTE) Fact Sheet for Patients: EntrepreneurPulse.com.au  Fact Sheet for Healthcare Providers: IncredibleEmployment.be  This test is not yet approved or cleared by the [place] FDA and has been authorized for detection and/or diagnosis of SARS-CoV-2 by FDA under an Emergency Use Authorization (EUA). This EUA will remain in effect (meaning this test can be used) for the duration of the COVID-19 declaration under Section 564(b)(1) of the Act, 21 U.S.C. section 360bbb-3(b)(1), unless the authorization is terminated or revoked.  Performed at [facility], [place] [address], [place], [place] [address]   Blood culture (routine x 2)     Status: None   Collection Time: 06/02/22 11:31 AM   Specimen: BLOOD RIGHT ARM  Result Value Ref Range Status   Specimen Description BLOOD RIGHT ARM  Final   Special Requests   Final    BOTTLES DRAWN AEROBIC AND ANAEROBIC Blood Culture adequate volume   Culture   Final    NO GROWTH 5 DAYS Performed at [facility], [place] [address] [place], [place] [address]    Report Status 06/07/2022 FINAL  Final  Blood culture (routine x 2)     Status: None   Collection Time: 06/02/22 11:59 AM   Specimen: BLOOD RIGHT HAND  Result Value Ref Range Status   Specimen Description BLOOD RIGHT HAND  Final   Special Requests   Final    BOTTLES DRAWN AEROBIC AND ANAEROBIC Blood Culture results may not be optimal due to an inadequate volume of blood received in culture  bottles   Culture   Final    NO GROWTH 5 DAYS Performed at [facility], [place] [address] [place], [place] [address]    Report Status 06/07/2022 FINAL  Final  MRSA Next Gen by PCR, Nasal     Status: None   Collection Time: 06/02/22  2:51 PM   Specimen: Nasal Mucosa; Nasal Swab  Result Value Ref Range Status   MRSA by PCR Next Gen NOT DETECTED NOT DETECTED Final    Comment: (NOTE) The GeneXpert MRSA Assay (FDA approved for NASAL specimens only), is one component of a comprehensive MRSA colonization surveillance program. It is not intended to diagnose MRSA infection nor to guide or monitor treatment for MRSA infections. Test performance is not FDA approved in patients less than 61 years old. Performed at [facility], [place] [address], [place],  [address]   Surgical PCR screen     Status: Abnormal   Collection Time: 06/04/22 10:52 AM  Specimen: Nasal Mucosa; Nasal Swab  Result Value Ref Range Status   MRSA, PCR NEGATIVE NEGATIVE Final   Staphylococcus aureus POSITIVE (A) NEGATIVE Final    Comment: (NOTE) The Xpert SA Assay (FDA approved for NASAL specimens in patients 21 years of age and older), is one component of a comprehensive surveillance program. It is not intended to diagnose infection nor to guide or monitor treatment. Performed at [facility], [place] [address] [place], [place] [address]      Labs: BNP (last 3 results) No results for input(s): "BNP" in the last 8760 hours. Basic Metabolic Panel: Recent Labs  Lab 06/02/22 2234 06/03/22 0154 06/04/22 0440 06/05/22 0502 06/06/22 0025 06/07/22 0029 06/08/22 0037  NA  --    < > 136 139 138 138 141  K 4.4   < > 3.9 3.5 3.8 4.0 3.5  CL  --    < > 105 106 104 107 108  CO2  --    < > '23 26 25 23 25  '$ GLUCOSE  --    < > 109* 82 100* 86 91  BUN  --    < > '17 18 18 19 20  '$ CREATININE  --    < > 0.93 0.86 0.83 1.03 0.86  CALCIUM  --    < > 8.7* 8.6* 8.6* 8.7* 8.5*  MG 1.6*  --  1.9 1.7 2.0  --   1.9   < > = values in this interval not displayed.   Liver Function Tests: Recent Labs  Lab 06/03/22 0422 06/08/22 0037  AST 31 35  ALT 26 24  ALKPHOS 60 51  BILITOT 0.8 0.8  PROT 5.7* 5.8*  ALBUMIN 2.9* 2.6*   No results for input(s): "LIPASE", "AMYLASE" in the last 168 hours. Recent Labs  Lab 06/02/22 1650 06/03/22 0422 06/08/22 0037  AMMONIA 43* 42* 69*   CBC: Recent Labs  Lab 06/03/22 0154 06/03/22 0422 06/03/22 2108 06/05/22 0502 06/06/22 0025  WBC 6.4 6.3 8.3 8.8 9.6  NEUTROABS  --  5.3  --  6.8  --   HGB 11.1* 11.6* 11.7* 11.0* 11.3*  HCT 33.2* 33.6* 35.1* 32.1* 33.2*  MCV 94.1 92.3 97.2 95.3 94.6  PLT 163 156 149* 147* 143*   Cardiac Enzymes: No results for input(s): "CKTOTAL", "CKMB", "CKMBINDEX", "TROPONINI" in the last 168 hours. BNP: Invalid input(s): "POCBNP" CBG: Recent Labs  Lab 06/03/22 1952 06/07/22 1211  GLUCAP 128* 113*   D-Dimer No results for input(s): "DDIMER" in the last 72 hours. Hgb A1c No results for input(s): "HGBA1C" in the last 72 hours. Lipid Profile No results for input(s): "CHOL", "HDL", "LDLCALC", "TRIG", "CHOLHDL", "LDLDIRECT" in the last 72 hours. Thyroid function studies No results for input(s): "TSH", "T4TOTAL", "T3FREE", "THYROIDAB" in the last 72 hours.  Invalid input(s): "FREET3" Anemia work up Recent Labs    06/08/22 0037  VITAMINB12 513   Urinalysis    Component Value Date/Time   COLORURINE STRAW (A) 06/02/2022 [place] 06/02/2022 1131   LABSPEC 1.009 06/02/2022 1131   PHURINE 7.0 06/02/2022 [place] 06/02/2022 [place] 06/02/2022 [place] 06/02/2022 1131   KETONESUR NEGATIVE 06/02/2022 1131   PROTEINUR NEGATIVE 06/02/2022 1131   NITRITE NEGATIVE 06/02/2022 1131   LEUKOCYTESUR NEGATIVE 06/02/2022 1131   Sepsis Labs Recent Labs  Lab 06/03/22 0422 06/03/22 2108 06/05/22 0502 06/06/22 0025  WBC 6.3 8.3 8.8 9.6    Microbiology Recent  Results (from the past 240 hour(s))  Resp panel by RT-PCR (RSV, Flu A&B, Covid) Anterior Nasal Swab     Status: None   Collection Time: 06/02/22 11:03 AM   Specimen: Anterior Nasal Swab  Result Value Ref Range Status   SARS Coronavirus 2 by RT PCR NEGATIVE NEGATIVE Final    Comment: (NOTE) SARS-CoV-2 target nucleic acids are NOT DETECTED.  The SARS-CoV-2 RNA is generally detectable in upper respiratory specimens during the acute phase of infection. The lowest concentration of SARS-CoV-2 viral copies this assay can detect is 138 copies/mL. A negative result does not preclude SARS-Cov-2 infection and should not be used as the sole basis for treatment or other patient management decisions. A negative result may occur with  improper specimen collection/handling, submission of specimen other than nasopharyngeal swab, presence of viral mutation(s) within the areas targeted by this assay, and inadequate number of viral copies(<138 copies/mL). A negative result must be combined with clinical observations, patient history, and epidemiological information. The expected result is Negative.  Fact Sheet for Patients:  EntrepreneurPulse.com.au  Fact Sheet for Healthcare Providers:  IncredibleEmployment.be  This test is no t yet approved or cleared by the [place] FDA and  has been authorized for detection and/or diagnosis of SARS-CoV-2 by FDA under an Emergency Use Authorization (EUA). This EUA will remain  in effect (meaning this test can be used) for the duration of the COVID-19 declaration under Section 564(b)(1) of the Act, 21 U.S.C.section 360bbb-3(b)(1), unless the authorization is terminated  or revoked sooner.       Influenza A by PCR NEGATIVE NEGATIVE Final   Influenza B by PCR NEGATIVE NEGATIVE Final    Comment: (NOTE) The Xpert Xpress SARS-CoV-2/FLU/RSV plus assay is intended as an aid in the diagnosis of  influenza from Nasopharyngeal swab specimens and should not be used as a sole basis for treatment. Nasal washings and aspirates are unacceptable for Xpert Xpress SARS-CoV-2/FLU/RSV testing.  Fact Sheet for Patients: EntrepreneurPulse.com.au  Fact Sheet for Healthcare Providers: IncredibleEmployment.be  This test is not yet approved or cleared by the [place] FDA and has been authorized for detection and/or diagnosis of SARS-CoV-2 by FDA under an Emergency Use Authorization (EUA). This EUA will remain in effect (meaning this test can be used) for the duration of the COVID-19 declaration under Section 564(b)(1) of the Act, 21 U.S.C. section 360bbb-3(b)(1), unless the authorization is terminated or revoked.     Resp Syncytial Virus by PCR NEGATIVE NEGATIVE Final    Comment: (NOTE) Fact Sheet for Patients: EntrepreneurPulse.com.au  Fact Sheet for Healthcare Providers: IncredibleEmployment.be  This test is not yet approved or cleared by the [place] FDA and has been authorized for detection and/or diagnosis of SARS-CoV-2 by FDA under an Emergency Use Authorization (EUA). This EUA will remain in effect (meaning this test can be used) for the duration of the COVID-19 declaration under Section 564(b)(1) of the Act, 21 U.S.C. section 360bbb-3(b)(1), unless the authorization is terminated or revoked.  Performed at [facility], [place] [address], [place], [place] [address]   Blood culture (routine x 2)     Status: None   Collection Time: 06/02/22 11:31 AM   Specimen: BLOOD RIGHT ARM  Result Value Ref Range Status   Specimen Description BLOOD RIGHT ARM  Final   Special Requests   Final    BOTTLES DRAWN AEROBIC AND ANAEROBIC Blood Culture adequate volume   Culture   Final    NO GROWTH 5 DAYS Performed at [facility], [place] [address], [place], [place] [address]    Report Status 06/07/2022  FINAL  Final  Blood culture (routine x 2)     Status: None   Collection Time: 06/02/22 11:59 AM   Specimen: BLOOD RIGHT HAND  Result Value Ref Range Status   Specimen Description BLOOD RIGHT HAND  Final   Special Requests   Final    BOTTLES DRAWN AEROBIC AND ANAEROBIC Blood Culture results may not be optimal due to an inadequate volume of blood received in culture bottles   Culture   Final    NO GROWTH 5 DAYS Performed at [facility], [place] [address] [place], [place] [address]    Report Status 06/07/2022 FINAL  Final  MRSA Next Gen by PCR, Nasal     Status: None   Collection Time: 06/02/22  2:51 PM   Specimen: Nasal Mucosa; Nasal Swab  Result Value Ref Range Status   MRSA by PCR Next Gen NOT DETECTED NOT DETECTED Final    Comment: (NOTE) The GeneXpert MRSA Assay (FDA approved for NASAL specimens only), is one component of a comprehensive MRSA colonization surveillance program. It is not intended to diagnose MRSA infection nor to guide or monitor treatment for MRSA infections. Test performance is not FDA approved in patients less than 4 years old. Performed at [facility], [place] [address], [place], [place] [address]   Surgical PCR screen     Status: Abnormal   Collection Time: 06/04/22 10:52 AM   Specimen: Nasal Mucosa; Nasal Swab  Result Value Ref Range Status   MRSA, PCR NEGATIVE NEGATIVE Final   Staphylococcus aureus POSITIVE (A) NEGATIVE Final    Comment: (NOTE) The Xpert SA Assay (FDA approved for NASAL specimens in patients 36 years of age and older), is one component of a comprehensive surveillance program. It is not intended to diagnose infection nor to guide or monitor treatment. Performed at [facility], [place] [address] [place],  [address]      Time coordinating discharge: 35 minutes  SIGNED:   Aline August, MD  [facility] 06/09/2022, 10:02 AM

## 2022-06-09 NOTE — TOC Progression Note (Signed)
Transition of Care [facility]) - Progression Note    Patient Details  Name: Kenneth Bowen MRN: [id number] Date of Birth: 1930-10-30  Transition of Care [facility]) CM/SW [place], LCSW Phone Number: 06/09/2022, 1:02 PM  Clinical Narrative:    [facility] informed CSW that pt can dc to SNF today. [facility] communicated with CSW that pt room number is 403B and number for report is [phone]. TOC will continue to assist as needed.    Expected Discharge Plan: [place] Barriers to Discharge: Continued Medical Work up  Expected Discharge Plan and [place] arrangements for the past 2 months: [place] Expected Discharge Date: 06/09/22                                     Social Determinants of Health (SDOH) Interventions SDOH Screenings   Food Insecurity: No Food Insecurity (06/02/2022)  Housing: Low Risk  (06/02/2022)  Transportation Needs: No Transportation Needs (06/02/2022)  Utilities: Not At Risk (06/02/2022)  Tobacco Use: Low Risk  (06/05/2022)    Readmission Risk Interventions     No data to display         Beckey Rutter, MSW, LCSWA, LCASA Transitions of Care  Clinical Social Worker I

## 2022-06-09 NOTE — TOC Progression Note (Signed)
Transition of Care [facility]) - Progression Note    Patient Details  Name: Kenneth Bowen MRN: [id number] Date of Birth: 25-Jul-1930  Transition of Care [facility]) CM/SW [place], [place] Phone Number: 06/09/2022, 10:46 AM  Clinical Narrative:     CSW spoke with [facility], auth approved for [facility] [id number], auth id [id number], starting 06/09/22-06/12/22. CSW waiting to hear back from [facility] on what time pt can admit.   Expected Discharge Plan: [place] Barriers to Discharge: Continued Medical Work up  Expected Discharge Plan and [place] arrangements for the past 2 months: [place] Expected Discharge Date: 06/09/22                                     Social Determinants of Health (SDOH) Interventions SDOH Screenings   Food Insecurity: No Food Insecurity (06/02/2022)  Housing: Low Risk  (06/02/2022)  Transportation Needs: No Transportation Needs (06/02/2022)  Utilities: Not At Risk (06/02/2022)  Tobacco Use: Low Risk  (06/05/2022)    Readmission Risk Interventions     No data to display

## 2022-06-09 NOTE — Progress Notes (Addendum)
   Palliative Medicine Inpatient Follow Up Note HPI: 89 y.o. male  with past medical history of unprovoked DVT, prostate cancer s/p radiation, RLS, parkinsonism, and seizure admitted on 06/02/2022 with syncope/near syncope. Found to be hypothermic and bradycardic. Code stroke was called because of patient becoming unresponsive with fixed pupils; neurology was consulted. CT of the brain did not show any acute intracranial abnormity and CTA was negative for LVO or hemodynamically significant stenosis. EEG was normal. He subsequently had bradycardic arrest requiring CPR. He was placed on dopamine drip. 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 PMT consulted to discuss [place].   Today's Discussion 06/09/2022  *Please note that this is a verbal dictation therefore any spelling or grammatical errors are due to the "[employer] One" system interpretation.  Chart reviewed inclusive of vital signs, progress notes, laboratory results, and diagnostic images.   I met with Kenneth Bowen at bedside this morning. He is resting comfortably and not noted to be in any distress at this time. He shares that he was able to walk yesterday.   Kenneth Bowen has had no new seizures per conversation with patients RN, Tamra.  Plan for transition to [facility] once medically optimized and a bed becomes available.   I have called patients wife, Kenneth Bowen this morning and provided an update.   Objective Assessment: Vital Signs Vitals:   06/09/22 0537 06/09/22 0734  BP:  (!) 162/70  Pulse: (!) 59 72  Resp: (!) 22 20  Temp:  97.9 F (36.6 C)  SpO2: 94% 96%    Intake/Output Summary (Last 24 hours) at 06/09/2022 4010 Last data filed at 06/08/2022 2725 Gross per 24 hour  Intake 50 ml  Output 300 ml  Net -250 ml    Last Weight  Most recent update: 06/09/2022  5:38 AM    Weight  82.3 kg (181 lb 7 oz)            Gen:  Frail elderly Caucasian M in NAD HEENT: moist mucous membranes CV: Regular rate and rhythm  PULM: On  RA, breathing is even and nonlabored ABD: soft/nontender  EXT: Ecchymosis on BUE Neuro: Awake and alert  SUMMARY OF RECOMMENDATIONS   DNAR/DNI  Continue current scope of medical care  Continue to allow time for outcomes  Patients wife is hopeful patient can transition to rehabilitation once medically optimized this will likely occur in the oncoming days  Ongoing PMT support  Billing based on MDM: Moderate ______________________________________________________________________________________ [place] Team Team Cell Phone: [phone] Please utilize secure chat with additional questions, if there is no response within 30 minutes please call the above phone number  Palliative Medicine Team providers are available by phone from 7am to 7pm daily and can be reached through the team cell phone.  Should this patient require assistance outside of these hours, please call the patient's attending physician.

## 2022-06-15 ENCOUNTER — Non-Acute Institutional Stay: Payer: Self-pay | Admitting: Family Medicine

## 2022-06-15 ENCOUNTER — Telehealth: Payer: Self-pay | Admitting: Family Medicine

## 2022-06-15 ENCOUNTER — Encounter: Payer: Self-pay | Admitting: Family Medicine

## 2022-06-15 VITALS — BP 100/50 | HR 71 | Temp 98.6°F | Resp 18

## 2022-06-15 DIAGNOSIS — Z9189 Other specified personal risk factors, not elsewhere classified: Secondary | ICD-10-CM

## 2022-06-15 DIAGNOSIS — G40209 Localization-related (focal) (partial) symptomatic epilepsy and epileptic syndromes with complex partial seizures, not intractable, without status epilepticus: Secondary | ICD-10-CM

## 2022-06-15 DIAGNOSIS — R5381 Other malaise: Secondary | ICD-10-CM

## 2022-06-15 DIAGNOSIS — Z515 Encounter for palliative care: Secondary | ICD-10-CM | POA: Insufficient documentation

## 2022-06-15 NOTE — Telephone Encounter (Signed)
TCT follow up with wife on Palliative Referral and attempt to address MOST but got voice mail.  Left message introducing self and providing call back phone number.  Damaris Hippo FNP-C

## 2022-06-15 NOTE — Progress Notes (Signed)
[facility] Palliative Care Consult Note Telephone: [phone]  Fax: [phone]   Date of encounter: 06/15/22 10:41 AM PATIENT NAME: Kenneth Bowen [address] [place] [address]   [phone] (home)  DOB: 12/17/1930 MRN: [id number] PRIMARY CARE PROVIDER:    Lorn Junes  [place] [address] [phone]  REFERRING PROVIDER:   Sueanne Margarita, [address] [place],  [place] [address] [phone]  RESPONSIBLE PARTY:    Contact Information     Name Relation Home Work Mobile   [place] Spouse [phone]     Majestic, Brister   [phone]   Esdras, Delair   [phone]   Joseeduardo, Brix   [phone]        I met face to face with patient and family in [place]. Palliative Care was asked to follow this patient by consultation request of  Sueanne Margarita, DO to address advance care planning and complex medical decision making. This is the initial skilled nursing visit.          ASSESSMENT, SYMPTOM MANAGEMENT AND PLAN / RECOMMENDATIONS:   At risk for aspiration Educated pt to pay attention if there were particular foods/consistencies that were problematic to let me know. Agree with continued work with ST for cognitive assessment and safe swallow strategies.  2.  Physical deconditioning Continue work with PT and OT to improve functional independence and decrease fall risk. Recommend sampling orthostatic Bps to ensure pt is not orthostatic on standing.  3.  Partial symptomatic epilepsy with complex partial seizures, not intractable Continue Depakote and Topiramax as prescribed by Neurology. Recommend follow up CBC in 2 weeks to ensure stability of WBC/PLT on Depakote. Continue lactulose daily to help with ammonia. Repeat ammonia if increased confusion. Avoid alcohol at present time due to recent seizures.  4.  Palliative Care Encounter Address goals  of care with wife. Was just made DNR. Per advance directive he does not want extraordinary measures and these can be withheld under the conditions stated below Advised pt that with PPM, this would have to be turned off at end of life   Follow up Palliative Care Visit: Palliative care will continue to follow for complex medical decision making, advance care planning, and clarification of goals. Return 2-3 weeks or prn.    This visit was coded based on medical decision making (MDM).  PPS: 60%  HOSPICE ELIGIBILITY/DIAGNOSIS: TBD  Chief Complaint:  [facility] received a referral to follow up with patient for chronic disease management in setting of recent cardiorespiratory arrest due to complete heart block with PPM placement.  Palliative is also following to assist with advance directive and defining/refining goals of care.   HISTORY OF PRESENT ILLNESS:  Kenneth Bowen is a 89 y.o. year old male with hx of prostate cancer who recently in October 2023 had an unprovoked DVT RLE, started on Apixaban and renally dosed.  He was noted to be hypothermic when EMS arrived for a syncopal episode and he required atropine. He was admitted and had bradycardic arrest x 3, requiring CPR, dopamine and PPM placement on 06/04/22 for complete heart block.  His code status was changed during the admission to DNR.  He also became unresponsive with fixed pupils and a code stroke was initiated. Pt with no acute intracranial abnormality on CT Head and negative for LVO or hemodynamically significant stenosis.  Was later noted to have a seizure and was started on Depakote and  Topamax by Neurology.  Subsequently had another complex partial seizure and dose of Depakote was increased.  He was noted to have mildly elevated ammonia level and was started on Lactulose  He also had mild thrombocytopenia with no active bleeding. He was living with his spouse in [place] before hospitalization and  was sent from the hospital to [facility] for rehabilitation.  He also has hx of BPH, dyslipidemia and acute hypoxic respiratory failure. Speech therapy did bedside swallow evaluation and although pt cleared throat and coughed with thin liquids but had no overt difficulties when alert with regular diet and thin liquids so ST left him on this diet and recommended ST at the facility. Denies CP, SOB, DOE, lightheadedness, nausea/vomiting.  Denies dysuria or LUTS.  Had large BM x 2 last night.  Denies blood in stool.  Reports that he and his wife both have a revocable trust designating each other as spokesperson followed by their sons and this addressed no extraordinary measures.  Denies falls.  Walks with walker and unsteady gait.  Worked with PT using a walker, followed by a WC. Reports he does have some coughing with intake but states eating too fast and needing to drink with intake. Facility staff indicates he needs some help with bathing and dressing hard to reach areas due to his balance. He is continent of bowel and bladder.  History obtained from review of EMR, discussion with facility staff and/or Mr. Millirons.   CBC    Component Value Date/Time   WBC 9.6 06/06/2022 0025   RBC 3.51 (L) 06/06/2022 0025   HGB 11.3 (L) 06/06/2022 0025   HGB 11.2 (L) 02/14/2022 1416   HGB 12.2 (L) 02/10/2018 1352   HCT 33.2 (L) 06/06/2022 0025   HCT 37.1 (L) 02/10/2018 1352   PLT 143 (L) 06/06/2022 0025   PLT 293 02/14/2022 1416   PLT 267 02/10/2018 1352   MCV 94.6 06/06/2022 0025   MCV 102 (H) 02/10/2018 1352   MCH 32.2 06/06/2022 0025   MCHC 34.0 06/06/2022 0025   RDW 13.1 06/06/2022 0025   RDW 13.1 02/10/2018 1352   LYMPHSABS 0.7 06/05/2022 0502   LYMPHSABS 1.3 02/10/2018 1352   MONOABS 1.0 06/05/2022 0502   EOSABS 0.0 06/05/2022 0502   EOSABS 0.1 02/10/2018 1352   BASOSABS 0.1 06/05/2022 0502   BASOSABS 0.0 02/10/2018 1352      Latest Ref Rng & Units 06/08/2022   12:37 AM 06/07/2022   12:29 AM  06/06/2022   12:25 AM  CMP  Glucose 70 - 99 mg/dL 91  86  100   BUN 8 - 23 mg/dL '20  19  18   '$ Creatinine 0.61 - 1.24 mg/dL 0.86  1.03  0.83   Sodium 135 - 145 mmol/L 141  138  138   Potassium 3.5 - 5.1 mmol/L 3.5  4.0  3.8   Chloride 98 - 111 mmol/L 108  107  104   CO2 22 - 32 mmol/L '25  23  25   '$ Calcium 8.9 - 10.3 mg/dL 8.5  8.7  8.6   Total Protein 6.5 - 8.1 g/dL 5.8     Total Bilirubin 0.3 - 1.2 mg/dL 0.8     Alkaline Phos 38 - 126 U/L 51     AST 15 - 41 U/L 35     ALT 0 - 44 U/L 24          Latest Ref Rng & Units 06/08/2022   12:37 AM 06/03/2022  4:22 AM 02/14/2022    2:16 PM  Hepatic Function  Total Protein 6.5 - 8.1 g/dL 5.8  5.7  6.2   Albumin 3.5 - 5.0 g/dL 2.6  2.9  3.3   AST 15 - 41 U/L 35  31  19   ALT 0 - 44 U/L '24  26  17   '$ Alk Phosphatase 38 - 126 U/L 51  60  47   Total Bilirubin 0.3 - 1.2 mg/dL 0.8  0.8  0.6    Next appt: 06/20/2022 at 11:20 AM in Cardiology ([facility] Device 1)   0 Result Notes       Component Ref Range & Units 7 d ago 12 d ago 13 d ago  Ammonia 9 - 35 umol/L 69 High  42 High  CM 43 High  CM    06/02/22 CTA head and neck w/wo contrast: FINDINGS: CTA NECK FINDINGS   Aortic arch: Great vessel origins are patent.   Right carotid system: Atherosclerosis at the carotid bifurcation without greater than 50% stenosis.   Left carotid system: Atherosclerosis at the carotid bifurcation without greater than 50% stenosis   Vertebral arteries: Left dominant. Both vertebral arteries are patent without greater than 50% stenosis   Skeleton: Severe multilevel degenerative change.   Other neck: No acute abnormality on limited assessment.   Upper chest: No consolidation the visualized lung apices.   Review of the MIP images confirms the above findings   CTA HEAD FINDINGS   Anterior circulation: Bilateral intracranial ICAs are patent with mild for age narrowing due to atherosclerosis. Bilateral MCAs and ACAs are patent without proximal  hemodynamically significant stenosis.   Posterior circulation: Bilateral intradural vertebral arteries, basilar artery and bilateral posterior cerebral arteries are patent without proximal hemodynamically significant stenosis.   Venous sinuses: As permitted by contrast timing, patent. Small right transverse/sigmoid sinus, similar to the prior.   Review of the MIP images confirms the above findings   CT Brain Perfusion Findings:   ASPECTS: 10   CBF (<30%) Volume: 9m   Perfusion (Tmax>6.0s) volume: 527m  Mismatch Volume: 45m61m Infarction Location:None identified.   IMPRESSION: 1. No large vessel occlusion or proximal hemodynamically significant stenosis. 2. Small (5 mL) area of penumbra reported in the left posterior/inferior temporal region, possibly artifactual given streak artifact in this region.  08/04/21 2d Echo: LV EF 60-65%, normal size and no regional wall motion abnormalities.  Indeterminate diastolic function. Mildly enlarged RV, tricuspid regurg not significant enough to estimate RVSP. Mild mitral regurg, no other valve disease.  I reviewed EMR for available labs, medications, imaging, studies and related documents.  No new records since last visit/Records reviewed and summarized above.   ROS General: NAD ENMT: endorses coughing with some intake Cardiovascular: denies chest pain, denies DOE Pulmonary: denies SOB Abdomen: endorses good appetite and resolving constipation, endorses continence of bowel GU: denies dysuria, endorses continence of urine MSK:  reports generalized weakness, no falls reported except for recent syncopal episode that led to hospitalization Skin: scattered bruising and skin tears  Neurological: denies pain, denies insomnia Psych: Endorses positive mood Heme/lymph/immuno: endorses bruises, denies abnormal bleeding  Physical Exam: Current and past weights: 177 lbs 7.5 oz on 06/08/22 Constitutional: NAD General: frail, WN, WD ENMT:  intact hearing, oral mucous membranes moist, dentition intact CV: S1S2, RRR without MRG, no LE edema but extremely dry skin of Bilateral lower legs Pulmonary: CTAB, no increased work of breathing, no cough, room air Abdomen:normo-active BS + 4 quadrants, soft  and non tender GU: deferred MSK: no sarcopenia, moves all extremities, ambulatory with unsteady balance/gait and walker Skin: warm and dry, multiple skin tears of varying sizes to bilateral upper arms, widespread scattered ecchymoses of varying sizes and stages of healing on extremities and trunk.  PPM incision site just inferior to left clavicle, intact to dried blood and steristrips without erythema or drainage. Neuro:  noted generalized LE weakness, /poor standing balance, no cognitive impairment Psych: non-anxious affect, A and O x 3 Hem/lymph/immuno: Widespread bruising as noted above  CURRENT PROBLEM LIST:  Patient Active Problem List   Diagnosis Date Noted   Acute respiratory failure with hypoxia ([place]) 06/05/2022   Physical deconditioning 06/05/2022   Heart block AV complete ([place]) 06/04/2022   Altered mental status 06/03/2022   Bradycardia 06/02/2022   Encephalopathy acute 06/02/2022   Sepsis ([place]) 06/02/2022   Suspected stroke patient last known to be well 2 to 3 hours ago 06/02/2022   Hypothermia 06/02/2022   Therapeutic drug monitoring 06/10/2017   Partial symptomatic epilepsy with complex partial seizures, not intractable, without status epilepticus ([place]) 12/05/2016   Syncope 07/29/2016   Unresponsiveness 07/28/2016   BPH (benign prostatic hyperplasia) 07/28/2016   Dyslipidemia 07/28/2016   Anxiety    History of prostate cancer    TIA (transient ischemic attack) 07/12/2016   PAST MEDICAL HISTORY:  Active Ambulatory Problems    Diagnosis Date Noted   TIA (transient ischemic attack) 07/12/2016   Anxiety    History of prostate cancer    Unresponsiveness 07/28/2016   BPH (benign prostatic hyperplasia) 07/28/2016    Dyslipidemia 07/28/2016   Syncope 07/29/2016   Partial symptomatic epilepsy with complex partial seizures, not intractable, without status epilepticus ([place]) 12/05/2016   Therapeutic drug monitoring 06/10/2017   Bradycardia 06/02/2022   Encephalopathy acute 06/02/2022   Sepsis ([place]) 06/02/2022   Suspected stroke patient last known to be well 2 to 3 hours ago 06/02/2022   Hypothermia 06/02/2022   Altered mental status 06/03/2022   Heart block AV complete ([place]) 06/04/2022   Acute respiratory failure with hypoxia ([place]) 06/05/2022   Physical deconditioning 06/05/2022   Resolved Ambulatory Problems    Diagnosis Date Noted   No Resolved Ambulatory Problems   Past Medical History:  Diagnosis Date   Colon polyps    Diverticulosis    Prostate cancer ([place]) 2015   Seizures ([place]) 07/2016   SOCIAL HX:  Social History   Tobacco Use   Smoking status: Never   Smokeless tobacco: Never  Substance Use Topics   Alcohol use: Yes    Alcohol/week: 7.0 standard drinks of alcohol    Types: 7 Glasses of wine per week    Comment: 4 oz nightly   FAMILY HX:  Family History  Problem Relation Age of Onset   Alcoholism Father    Cancer Neg Hx    Colon cancer Neg Hx        Preferred Pharmacy: ALLERGIES: No Known Allergies   PERTINENT MEDICATIONS:  Outpatient Encounter Medications as of 06/15/2022  Medication Sig   apixaban (ELIQUIS) 2.5 MG TABS tablet Take 1 tablet (2.5 mg total) by mouth 2 (two) times daily. Resume from 06/10/22   ARTIFICIAL TEAR OP Place 1 drop into both eyes daily as needed (dry eye).   cholecalciferol (VITAMIN D) 1000 UNITS tablet Take 2,000 Units by mouth daily.    desoximetasone (TOPICORT) 0.25 % cream Apply 1 Application topically at bedtime.   divalproex (DEPAKOTE ER) 250 MG 24 hr tablet Take 3 tablets (  750 mg total) by mouth daily.   Ferrous Sulfate (IRON PO) Take 1 tablet by mouth daily.   folic acid (FOLVITE) 1 MG tablet Take 1 tablet (1 mg total) by mouth daily.    furosemide (LASIX) 40 MG tablet Take 40 mg by mouth daily.   hydrocortisone 2.5 % cream Apply 1 Application topically 2 (two) times daily as needed for other (Skin irritation).   lactulose (CHRONULAC) 10 GM/15ML solution Take 15 mLs (10 g total) by mouth 2 (two) times daily.   mometasone (ELOCON) 0.1 % ointment Apply 1 application topically 2 (two) times daily as needed (rash).    Multiple Vitamin (MULTIVITAMIN) tablet Take 1 tablet by mouth daily.   omeprazole (PRILOSEC) 20 MG capsule Take 20 mg by mouth daily.   tamsulosin (FLOMAX) 0.4 MG CAPS capsule TAKE 1 CAPSULE (0.4 MG TOTAL) BY MOUTH DAILY AFTER SUPPER. (Patient taking differently: Take 0.4 mg by mouth daily.)   topiramate (TOPAMAX) 50 MG tablet Take 1 tablet (50 mg total) by mouth at bedtime.   No facility-administered encounter medications on file as of 06/15/2022.     -------------------------------------------------------- Advance Care Planning/Goals of Care: Goals include to maximize quality of life and symptom management. Patient gave his permission to discuss.Our advance care planning conversation included a discussion about:    The value and importance of advance care planning-has an advance directive and a revocable living trust Exploration of goals of care in the event of a sudden injury or illness  Identification  of a healthcare agent-wife [name] made pt DNR/DNI Review of an advance directive document-DNR and Declaration of Desire for natural death If my attending physician determines I lack capacity to make or communicate health care decisions then it applies.  Life prolonging treatment can be withheld or withdrawn:  if I have an incurable or irreversible condition which will result in death in a short period of time; if I become unconscious and my health care providers determine to a high degree of medical certainty I will not regain consciousness or if I suffer from advanced dementia or other condition which results in  substantial loss of cognitive ability and health care provider determine with a high degree of medical certainty the loss is irreversible. In these situations he has directed health care providers to withhold or withdraw life-prolonging measures including artificial nutrition and hydration.  Expressed wish to be kept comfortable, as pain free as possible with maintenance of his dignity even if this hastens his death. Signed, witnessed and notarized on 05/17/2009. Decision not to resuscitate or to de-escalate disease focused treatments due to poor prognosis. CODE STATUS: DNR    Thank you for the opportunity to participate in the care of Mr. Trompeter.  The palliative care team will continue to follow. Please call our office at [phone] if we can be of additional assistance.   Marijo Conception, FNP-C  COVID-19 PATIENT SCREENING TOOL Asked and negative response unless otherwise noted:  Have you had symptoms of covid, tested positive or been in contact with someone with symptoms/positive test in the past 5-10 days?  unknown

## 2022-06-20 ENCOUNTER — Ambulatory Visit: Payer: No Typology Code available for payment source

## 2022-06-20 DIAGNOSIS — S51811A Laceration without foreign body of right forearm, initial encounter: Secondary | ICD-10-CM

## 2022-06-20 DIAGNOSIS — S61411A Laceration without foreign body of right hand, initial encounter: Secondary | ICD-10-CM | POA: Diagnosis not present

## 2022-06-21 DIAGNOSIS — Z86718 Personal history of other venous thrombosis and embolism: Secondary | ICD-10-CM

## 2022-06-21 DIAGNOSIS — Z950 Presence of cardiac pacemaker: Secondary | ICD-10-CM | POA: Diagnosis not present

## 2022-06-21 DIAGNOSIS — G40209 Localization-related (focal) (partial) symptomatic epilepsy and epileptic syndromes with complex partial seizures, not intractable, without status epilepticus: Secondary | ICD-10-CM | POA: Diagnosis not present

## 2022-06-21 DIAGNOSIS — R55 Syncope and collapse: Secondary | ICD-10-CM | POA: Diagnosis not present

## 2022-06-29 DIAGNOSIS — R2689 Other abnormalities of gait and mobility: Secondary | ICD-10-CM | POA: Diagnosis not present

## 2022-06-29 DIAGNOSIS — Z9181 History of falling: Secondary | ICD-10-CM | POA: Diagnosis not present

## 2022-06-29 DIAGNOSIS — M6281 Muscle weakness (generalized): Secondary | ICD-10-CM | POA: Diagnosis not present

## 2022-07-04 ENCOUNTER — Telehealth: Payer: Self-pay

## 2022-07-04 NOTE — Telephone Encounter (Signed)
The patient just got home from Rehab today. He is scheduled for a wound check this Friday at 12:30 pm. The nurse is going to remove his bandage at that appointment. His monitor did update 07/04/2022.

## 2022-07-06 ENCOUNTER — Ambulatory Visit: Payer: Medicare Other | Attending: Cardiology

## 2022-07-06 DIAGNOSIS — I442 Atrioventricular block, complete: Secondary | ICD-10-CM | POA: Diagnosis not present

## 2022-07-06 LAB — CUP PACEART INCLINIC DEVICE CHECK
Battery Remaining Longevity: 60 mo
Battery Voltage: 2.99 V
Brady Statistic RA Percent Paced: 53 %
Brady Statistic RV Percent Paced: 96 %
Date Time Interrogation Session: 20240223141820
Implantable Lead Connection Status: 753985
Implantable Lead Connection Status: 753985
Implantable Lead Implant Date: 20240122
Implantable Lead Implant Date: 20240122
Implantable Lead Location: 753859
Implantable Lead Location: 753860
Implantable Pulse Generator Implant Date: 20240122
Lead Channel Impedance Value: 425 Ohm
Lead Channel Impedance Value: 487.5 Ohm
Lead Channel Pacing Threshold Amplitude: 1 V
Lead Channel Pacing Threshold Amplitude: 1 V
Lead Channel Pacing Threshold Amplitude: 1 V
Lead Channel Pacing Threshold Amplitude: 1 V
Lead Channel Pacing Threshold Pulse Width: 0.5 ms
Lead Channel Pacing Threshold Pulse Width: 0.5 ms
Lead Channel Pacing Threshold Pulse Width: 0.5 ms
Lead Channel Pacing Threshold Pulse Width: 0.5 ms
Lead Channel Sensing Intrinsic Amplitude: 12 mV
Lead Channel Sensing Intrinsic Amplitude: 5 mV
Lead Channel Setting Pacing Amplitude: 3.5 V
Lead Channel Setting Pacing Amplitude: 3.5 V
Lead Channel Setting Pacing Pulse Width: 0.5 ms
Lead Channel Setting Sensing Sensitivity: 2 mV
Pulse Gen Model: 2272
Pulse Gen Serial Number: 5681397

## 2022-07-06 NOTE — Progress Notes (Signed)
Wound check appointment. Steri-strips removed. Wound without redness or edema. Incision edges approximated, wound well healed. Normal device function. Thresholds, sensing, and impedances consistent with implant measurements. Device programmed at 3.5V/auto capture programmed on for extra safety margin until 3 month visit. Histogram distribution appropriate for patient and level of activity. Afib burden <1%-on Eliquis. Patient educated about wound care, arm mobility, lifting restrictions. ROV in 3 months with implanting physician.  Pt was given a remote monitor at the hospital, but this did not get sent with Pt when he was discharged to SNF.    [facility] will mail another monitor to [name] home.  Will ensure Pt is able to use monitor-instruction given in office.

## 2022-07-06 NOTE — Patient Instructions (Addendum)
After Your Pacemaker   Monitor your pacemaker site for redness, swelling, and drainage. Call the device clinic at [phone] if you experience these symptoms or fever/chills.  Your incision was closed with Steri-strips or staples:  You may shower 7 days after your procedure and wash your incision with soap and water. Avoid lotions, ointments, or perfumes over your incision until it is well-healed.  You may use a hot tub or a pool after your wound check appointment if the incision is completely closed.  Do not lift, push or pull greater than 10 pounds with the affected arm until 6 weeks after your procedure. There are no other restrictions in arm movement after your wound check appointment. (2 more weeks)  You may drive, unless driving has been restricted by your healthcare providers.  Remote monitoring is used to monitor your pacemaker from home. This monitoring is scheduled every 91 days by our office. It allows [place] to keep an eye on the functioning of your device to ensure it is working properly. You will routinely see your Electrophysiologist annually (more often if necessary).

## 2022-07-19 ENCOUNTER — Ambulatory Visit: Payer: No Typology Code available for payment source

## 2022-07-26 ENCOUNTER — Telehealth: Payer: Self-pay

## 2022-07-26 NOTE — Telephone Encounter (Signed)
Transmission received 07/23/2022.

## 2022-07-26 NOTE — Telephone Encounter (Signed)
-----   Message from Damian Leavell, RN sent at 07/06/2022  2:22 PM EST ----- Regarding: remote monitor I saw this Pt in device clinic today.  [name] told me he did receive a box in the hospital, but when they discharged him to SNF it somehow didn't go with him.  He is now living at [facility] said she mailed him a box and a toggle doo hicky.  I just want to make sure we get him squared away.  Don't let me forget to check back in a week or so.  Roselyn Bering

## 2022-08-02 ENCOUNTER — Emergency Department (HOSPITAL_COMMUNITY): Payer: Medicare Other

## 2022-08-02 ENCOUNTER — Other Ambulatory Visit: Payer: Self-pay

## 2022-08-02 ENCOUNTER — Observation Stay (HOSPITAL_COMMUNITY): Payer: Medicare Other

## 2022-08-02 ENCOUNTER — Encounter (HOSPITAL_COMMUNITY): Payer: Self-pay

## 2022-08-02 ENCOUNTER — Observation Stay (HOSPITAL_COMMUNITY)
Admission: EM | Admit: 2022-08-02 | Discharge: 2022-08-07 | Disposition: A | Discharge status: Skilled Nursing Facility | Payer: Medicare Other | Attending: Internal Medicine | Admitting: Internal Medicine

## 2022-08-02 DIAGNOSIS — N401 Enlarged prostate with lower urinary tract symptoms: Secondary | ICD-10-CM | POA: Insufficient documentation

## 2022-08-02 DIAGNOSIS — I4891 Unspecified atrial fibrillation: Secondary | ICD-10-CM | POA: Diagnosis not present

## 2022-08-02 DIAGNOSIS — Y92019 Unspecified place in single-family (private) house as the place of occurrence of the external cause: Secondary | ICD-10-CM | POA: Insufficient documentation

## 2022-08-02 DIAGNOSIS — S22089A Unspecified fracture of T11-T12 vertebra, initial encounter for closed fracture: Secondary | ICD-10-CM | POA: Diagnosis not present

## 2022-08-02 DIAGNOSIS — D61818 Other pancytopenia: Secondary | ICD-10-CM | POA: Diagnosis not present

## 2022-08-02 DIAGNOSIS — S22080A Wedge compression fracture of T11-T12 vertebra, initial encounter for closed fracture: Secondary | ICD-10-CM | POA: Diagnosis present

## 2022-08-02 DIAGNOSIS — Z79899 Other long term (current) drug therapy: Secondary | ICD-10-CM | POA: Insufficient documentation

## 2022-08-02 DIAGNOSIS — Z950 Presence of cardiac pacemaker: Secondary | ICD-10-CM | POA: Insufficient documentation

## 2022-08-02 DIAGNOSIS — G20C Parkinsonism, unspecified: Secondary | ICD-10-CM | POA: Diagnosis not present

## 2022-08-02 DIAGNOSIS — Z7901 Long term (current) use of anticoagulants: Secondary | ICD-10-CM | POA: Insufficient documentation

## 2022-08-02 DIAGNOSIS — W1811XA Fall from or off toilet without subsequent striking against object, initial encounter: Secondary | ICD-10-CM | POA: Insufficient documentation

## 2022-08-02 DIAGNOSIS — R569 Unspecified convulsions: Secondary | ICD-10-CM

## 2022-08-02 DIAGNOSIS — Z8546 Personal history of malignant neoplasm of prostate: Secondary | ICD-10-CM | POA: Insufficient documentation

## 2022-08-02 DIAGNOSIS — W19XXXA Unspecified fall, initial encounter: Secondary | ICD-10-CM

## 2022-08-02 DIAGNOSIS — Z86718 Personal history of other venous thrombosis and embolism: Secondary | ICD-10-CM | POA: Diagnosis not present

## 2022-08-02 DIAGNOSIS — G40909 Epilepsy, unspecified, not intractable, without status epilepticus: Secondary | ICD-10-CM

## 2022-08-02 DIAGNOSIS — N400 Enlarged prostate without lower urinary tract symptoms: Secondary | ICD-10-CM

## 2022-08-02 DIAGNOSIS — R7401 Elevation of levels of liver transaminase levels: Secondary | ICD-10-CM

## 2022-08-02 LAB — I-STAT CHEM 8, ED
BUN: 15 mg/dL (ref 8–23)
Calcium, Ion: 1.27 mmol/L (ref 1.15–1.40)
Chloride: 96 mmol/L — ABNORMAL LOW (ref 98–111)
Creatinine, Ser: 0.6 mg/dL — ABNORMAL LOW (ref 0.61–1.24)
Glucose, Bld: 103 mg/dL — ABNORMAL HIGH (ref 70–99)
HCT: 34 % — ABNORMAL LOW (ref 39.0–52.0)
Hemoglobin: 11.6 g/dL — ABNORMAL LOW (ref 13.0–17.0)
Potassium: 4 mmol/L (ref 3.5–5.1)
Sodium: 133 mmol/L — ABNORMAL LOW (ref 135–145)
TCO2: 27 mmol/L (ref 22–32)

## 2022-08-02 LAB — COMPREHENSIVE METABOLIC PANEL
ALT: 18 U/L (ref 0–44)
AST: 42 U/L — ABNORMAL HIGH (ref 15–41)
Albumin: 2.9 g/dL — ABNORMAL LOW (ref 3.5–5.0)
Alkaline Phosphatase: 80 U/L (ref 38–126)
Anion gap: 7 (ref 5–15)
BUN: 13 mg/dL (ref 8–23)
CO2: 25 mmol/L (ref 22–32)
Calcium: 8.9 mg/dL (ref 8.9–10.3)
Chloride: 98 mmol/L (ref 98–111)
Creatinine, Ser: 0.58 mg/dL — ABNORMAL LOW (ref 0.61–1.24)
GFR, Estimated: >60 mL/min (ref >60)
Glucose, Bld: 104 mg/dL — ABNORMAL HIGH (ref 70–99)
Potassium: 4.2 mmol/L (ref 3.5–5.1)
Sodium: 130 mmol/L — ABNORMAL LOW (ref 135–145)
Total Bilirubin: 0.9 mg/dL (ref 0.3–1.2)
Total Protein: 6.1 g/dL — ABNORMAL LOW (ref 6.5–8.1)

## 2022-08-02 LAB — URINALYSIS, ROUTINE W REFLEX MICROSCOPIC
Bacteria, UA: NONE SEEN
Bilirubin Urine: NEGATIVE
Glucose, UA: NEGATIVE mg/dL
Ketones, ur: NEGATIVE mg/dL
Leukocytes,Ua: NEGATIVE
Nitrite: NEGATIVE
Protein, ur: NEGATIVE mg/dL
Specific Gravity, Urine: 1.013 (ref 1.005–1.030)
pH: 8 (ref 5.0–8.0)

## 2022-08-02 LAB — CBC WITH DIFFERENTIAL/PLATELET
Abs Immature Granulocytes: 0.06 10*3/uL (ref 0.00–0.07)
Basophils Absolute: 0 10*3/uL (ref 0.0–0.1)
Basophils Relative: 1 %
Eosinophils Absolute: 0 10*3/uL (ref 0.0–0.5)
Eosinophils Relative: 1 %
HCT: 32.7 % — ABNORMAL LOW (ref 39.0–52.0)
Hemoglobin: 10.9 g/dL — ABNORMAL LOW (ref 13.0–17.0)
Immature Granulocytes: 2 %
Lymphocytes Relative: 16 %
Lymphs Abs: 0.6 10*3/uL — ABNORMAL LOW (ref 0.7–4.0)
MCH: 32.2 pg (ref 26.0–34.0)
MCHC: 33.3 g/dL (ref 30.0–36.0)
MCV: 96.7 fL (ref 80.0–100.0)
Monocytes Absolute: 0.4 10*3/uL (ref 0.1–1.0)
Monocytes Relative: 11 %
Neutro Abs: 2.6 10*3/uL (ref 1.7–7.7)
Neutrophils Relative %: 69 %
Platelets: 141 10*3/uL — ABNORMAL LOW (ref 150–400)
RBC: 3.38 MIL/uL — ABNORMAL LOW (ref 4.22–5.81)
RDW: 14 % (ref 11.5–15.5)
WBC: 3.8 10*3/uL — ABNORMAL LOW (ref 4.0–10.5)
nRBC: 0 % (ref 0.0–0.2)

## 2022-08-02 LAB — VALPROIC ACID LEVEL: Valproic Acid Lvl: 40 ug/mL — ABNORMAL LOW (ref 50.0–100.0)

## 2022-08-02 MED ORDER — TAMSULOSIN HCL 0.4 MG PO CAPS
0.4000 mg | ORAL_CAPSULE | Freq: Every day | ORAL | Status: DC
  Administered 2022-08-02 – 2022-08-07 (×6): 0.4 mg via ORAL
  Filled 2022-08-02 (×7): qty 1

## 2022-08-02 MED ORDER — ALBUTEROL SULFATE (2.5 MG/3ML) 0.083% IN NEBU: 2.5000 mg | INHALATION_SOLUTION | Freq: Four times a day (QID) | RESPIRATORY_TRACT | Status: DC | PRN

## 2022-08-02 MED ORDER — ONDANSETRON HCL 4 MG/2ML IJ SOLN: 4.0000 mg | Freq: Four times a day (QID) | INTRAMUSCULAR | Status: DC | PRN

## 2022-08-02 MED ORDER — DIVALPROEX SODIUM ER 500 MG PO TB24
750.0000 mg | ORAL_TABLET | Freq: Every day | ORAL | Status: DC
  Administered 2022-08-02 – 2022-08-07 (×6): 750 mg via ORAL
  Filled 2022-08-02 (×7): qty 1

## 2022-08-02 MED ORDER — SODIUM CHLORIDE 0.9% FLUSH
3.0000 mL | Freq: Two times a day (BID) | INTRAVENOUS | Status: DC
  Administered 2022-08-02 – 2022-08-06 (×9): 3 mL via INTRAVENOUS

## 2022-08-02 MED ORDER — ACETAMINOPHEN 325 MG PO TABS
650.0000 mg | ORAL_TABLET | Freq: Four times a day (QID) | ORAL | Status: DC | PRN
  Administered 2022-08-03 – 2022-08-06 (×4): 650 mg via ORAL
  Filled 2022-08-02 (×5): qty 2

## 2022-08-02 MED ORDER — ONDANSETRON HCL 4 MG PO TABS: 4.0000 mg | ORAL_TABLET | Freq: Four times a day (QID) | ORAL | Status: DC | PRN

## 2022-08-02 MED ORDER — IOHEXOL 350 MG/ML SOLN
75.0000 mL | Freq: Once | INTRAVENOUS | Status: AC | PRN
  Administered 2022-08-02: 75 mL via INTRAVENOUS

## 2022-08-02 MED ORDER — ACETAMINOPHEN 650 MG RE SUPP: 650.0000 mg | Freq: Four times a day (QID) | RECTAL | Status: DC | PRN

## 2022-08-02 NOTE — Consult Note (Signed)
   Providing Compassionate, Quality Care - Together  Neurosurgery Consult  Referring physician: ED MD Reason for referral: T12 fracture  Chief Complaint: Fall  History of Present Illness: This is an 89 year old male, who had a mechanical fall slipping while going to the bathroom.  He does take Eliquis for history of DVT, also has a history of Parkinson's and seizure disorder.  He states he has chronic low back pain and this has been worsening and has a history of a chronic T12 fracture since 2017.  He denies any bowel or bladder changes.  Denies any groin numbness or focal lower extremity weakness.  He does complain of generalized weakness.  CT of the lumbar spine revealed acute T12 compression fracture and L1 compression fracture without significant malalignment or posterior element involvement.   Medications: I have reviewed the patient's current medications. Allergies: No Known Allergies  History reviewed. No pertinent family history. Social History:  has no history on file for tobacco use, alcohol use, and drug use.  ROS:   Physical Exam:  Vital signs in last 24 hours: Temp:  [98 F (36.7 C)-98.3 F (36.8 C)] 98 F (36.7 C) (07/25 1814) Pulse Rate:  [58-128] 65 (07/26 0746) Resp:  [11-18] 14 (07/26 0217) BP: (138-182)/(65-125) 153/88 (07/26 0700) SpO2:  [91 %-98 %] 96 % (07/26 0746) PE:  Awake alert oriented x 2 PERRLA Cranial nerves II through XII intact Bilateral upper extremities 5/5 Bilateral lower extremities 4+/5 throughout Sensory intact to light touch Abdomen soft Speech fluent and appropriate   Impression/Assessment:  89 year old male with  T12 and L1 acute fractures  Plan:  -CT and MRI reviewed.  Recommend TLSO bracing whenever out of bed -Okay for PT/OT with TLSO brace -No acute neurosurgical intervention.  Can follow-up as an outpatient in 2 to 4 weeks  Thank you for allowing me to participate in this patient's care.  Please do not hesitate to call  with questions or concerns.   Elwin Sleight, [facility] Cell: [phone]

## 2022-08-02 NOTE — ED Provider Notes (Signed)
[facility] Provider Note   CSN: [id number] Arrival date & time: 08/02/22  0415     History  Chief Complaint  Patient presents with   Fall    Pt on blood thinner eliquis.     Kenneth Bowen is a 89 y.o. male.  The history is provided by the patient.  Fall  Kenneth Bowen is a 89 y.o. male who presents to the Emergency Department complaining of fall.  He presents to the emergency department by EMS for evaluation of injuries following falls.  Patient states that on Wednesday morning around 930 he had been standing for a long time and began to feel weak and fell forward, striking his head.  He was assisted up and able to go about the day.  Early this morning he woke to urinate and he let himself to the floor and landed on his bottom.  He was unable to get back up at that time and complains of worsening of his chronic low back pain.  He does report feeling weak.  No reports of recent illnesses.  He does have a pacemaker for complete heart block.  History of Parkinson's.  He takes Eliquis for history of DVT.  He has a history of seizure disorder.  He lives at home with his wife.  He states that he typically falls once or twice a year.  He states that it has been a long time since his last fall.     Home Medications Prior to Admission medications   Medication Sig Start Date End Date Taking? Authorizing Provider  mupirocin ointment (BACTROBAN) 2 % APPLY SPARINGLY TO AFFECTED AREA TWICE A DAY 07/09/22  Yes [provider]  traZODone (DESYREL) 50 MG tablet  12/13/20  Yes [provider]  ALPRAZolam (XANAX) 0.5 MG tablet Take by mouth.    [provider]  apixaban (ELIQUIS) 2.5 MG TABS tablet Take 1 tablet (2.5 mg total) by mouth 2 (two) times daily. Resume from 06/10/22 06/10/22   Aline August, MD  ARTIFICIAL TEAR OP Place 1 drop into both eyes daily as needed (dry eye).    [provider]  cholecalciferol  (VITAMIN D) 1000 UNITS tablet Take 2,000 Units by mouth daily.     [provider]  desoximetasone (TOPICORT) 0.25 % cream Apply 1 Application topically at bedtime. 06/17/19   [provider]  divalproex (DEPAKOTE ER) 250 MG 24 hr tablet Take 3 tablets (750 mg total) by mouth daily. 06/09/22   Aline August, MD  Ferrous Sulfate (IRON PO) Take 1 tablet by mouth daily.    [provider]  folic acid (FOLVITE) 1 MG tablet Take 1 tablet (1 mg total) by mouth daily. 06/10/22   Aline August, MD  furosemide (LASIX) 40 MG tablet Take 40 mg by mouth daily.    [provider]  hydrocortisone 2.5 % cream Apply 1 Application topically 2 (two) times daily as needed for other (Skin irritation). 06/23/19   [provider]  lactulose (CHRONULAC) 10 GM/15ML solution Take 15 mLs (10 g total) by mouth 2 (two) times daily. 06/09/22   Aline August, MD  mometasone (ELOCON) 0.1 % ointment Apply 1 application topically 2 (two) times daily as needed (rash).  06/23/19   [provider]  Multiple Vitamin (MULTIVITAMIN) tablet Take 1 tablet by mouth daily.    [provider]  omeprazole (PRILOSEC) 20 MG capsule Take 20 mg by mouth daily. 12/29/18   [provider]  tamsulosin (FLOMAX) 0.4 MG CAPS capsule TAKE 1 CAPSULE (0.4 MG TOTAL) BY MOUTH DAILY AFTER SUPPER. Patient taking differently: Take 0.4 mg by mouth daily. 03/21/16   Tyler Pita, MD  topiramate (TOPAMAX) 50 MG tablet Take 1 tablet (50 mg total) by mouth at bedtime. 08/22/21   Frann Rider, NP      Allergies    Patient has no known allergies.    Review of Systems   Review of Systems  All other systems reviewed and are negative.   Physical Exam Updated Vital Signs BP (!) 147/88   Pulse (!) 59   Temp 97.6 F (36.4 C)   Resp 10   Ht 5\' 10"  (1.778 m)   Wt 81.2 kg   SpO2 94%   BMI 25.68 kg/m  Physical Exam Vitals and nursing note reviewed.  Constitutional:      Appearance: He is  well-developed.  HENT:     Head: Normocephalic.     Comments: Ecchymosis to the left face and cheek. Cardiovascular:     Rate and Rhythm: Normal rate and regular rhythm.     Heart sounds: No murmur heard. Pulmonary:     Effort: Pulmonary effort is normal. No respiratory distress.     Breath sounds: Normal breath sounds.  Abdominal:     Palpations: Abdomen is soft.     Tenderness: There is no abdominal tenderness. There is no guarding or rebound.  Musculoskeletal:     Comments: There is edema to bilateral lower extremities with erythema consistent with chronic venous stasis changes.  Tender to palpation over the midline lower back.  Skin:    General: Skin is warm and dry.  Neurological:     Mental Status: He is alert and oriented to person, place, and time.     Comments: Slow to answer questions.  Global weakness, greatest in bilateral lower extremities compared to upper extremities.  He is able to lift both legs off the stretcher.  Psychiatric:        Behavior: Behavior normal.     ED Results / Procedures / Treatments   Labs (all labs ordered are listed, but only abnormal results are displayed) Labs Reviewed  COMPREHENSIVE METABOLIC PANEL - Abnormal; Notable for the following components:      Result Value   Sodium 130 (*)    Glucose, Bld 104 (*)    Creatinine, Ser 0.58 (*)    Total Protein 6.1 (*)    Albumin 2.9 (*)    AST 42 (*)    All other components within normal limits  CBC WITH DIFFERENTIAL/PLATELET - Abnormal; Notable for the following components:   WBC 3.8 (*)    RBC 3.38 (*)    Hemoglobin 10.9 (*)    HCT 32.7 (*)    Platelets 141 (*)    Lymphs Abs 0.6 (*)    All other components within normal limits  I-STAT CHEM 8, ED - Abnormal; Notable for the following components:   Sodium 133 (*)    Chloride 96 (*)    Creatinine, Ser 0.60 (*)    Glucose, Bld 103 (*)    Hemoglobin 11.6 (*)    HCT 34.0 (*)    All other components within normal limits  URINALYSIS,  ROUTINE W REFLEX MICROSCOPIC    EKG None  Radiology CT CHEST ABDOMEN PELVIS W CONTRAST  Addendum Date: 08/02/2022   ADDENDUM REPORT: 08/02/2022 07:06 ADDENDUM: Study discussed by telephone with Dr. Quintella Reichert on 08/02/2022 at 0702 hours.  Electronically Signed   By: Genevie Ann M.D.   On: 08/02/2022 07:06   Result Date: 08/02/2022 CLINICAL DATA:  89 year old male status post fall.  Found down. EXAM: CT CHEST, ABDOMEN, AND PELVIS WITH CONTRAST TECHNIQUE: Multidetector CT imaging of the chest, abdomen and pelvis was performed following the standard protocol during bolus administration of intravenous contrast. RADIATION DOSE REDUCTION: This exam was performed according to the departmental dose-optimization program which includes automated exposure control, adjustment of the mA and/or kV according to patient size and/or use of iterative reconstruction technique. CONTRAST:  34mL OMNIPAQUE IOHEXOL 350 MG/ML SOLN COMPARISON:  Cervical spine CT, trauma portable chest and pelvis radiographs today. Chest CT 01/02/2021.  CT Abdomen and Pelvis 04/02/2018. Lumbar radiographs 07/21/2019. FINDINGS: CT CHEST FINDINGS Cardiovascular: New left chest cardiac pacemaker device since 2022. Mildly increased cardiac size, borderline cardiomegaly. No pericardial effusion. Calcified coronary artery plaque. Comparatively mild thoracic aortic plaque. Stable mild tortuosity of the thoracic aorta. Mediastinum/Nodes: Negative. No mediastinal hematoma or lymphadenopathy. Lungs/Pleura: Small layering left pleural effusion with simple fluid density is new since 2022, favor transudate. Mildly lower lung volumes overall. Major airways remain patent. Chronic streaky left lower lobe, lingula lung opacity has not significantly changed when allowing for effusion and lower lung volumes and is likely chronic scarring. Similar platelike opacity in the medial segment of the right middle lobe today. Similar right lower lobe ground-glass and platelike  opacity. No pneumothorax. No convincing pulmonary contusion. No convincing pneumonia. Musculoskeletal: Diffuse osteopenia. Widespread flowing thoracic endplate osteophytes result in multilevel thoracic interbody ankylosis. Chronic T2 inferior endplate compression fracture is stable. Chronic T11 mild compression fracture is stable. Chronic severe T12 compression fracture with near vertebra plana. However, acute superimposed distracted horizontal type fracture through the compressed T12 vertebral body (series 7, image 68). The T12 posterior elements appear to remain intact. Superimposed chronic L1 superior endplate compression and the L1 posterior elements also appear intact. No spondylolisthesis at this level. Mild prevertebral hematoma or edema ventral to T12 but no CT evidence of spinal epidural hematoma. Visible shoulder osseous structures and sternum appear to remain intact. And no acute rib fracture is identified. CT ABDOMEN PELVIS FINDINGS Hepatobiliary: Liver and gallbladder appear intact. No perihepatic fluid. Pancreas: Partial fatty atrophy. Spleen: Diminutive, intact.  No perisplenic fluid. Adrenals/Urinary Tract: Stable and normal for age adrenal glands and kidneys. Symmetric renal enhancement and contrast excretion with decompressed ureters. Distended urinary bladder (890 mL), otherwise unremarkable. Stomach/Bowel: Redundant but mostly decompressed sigmoid colon in the pelvis and bilateral lower quadrants. Upstream retained stool from the right colon to the descending colon. No large bowel inflammation. Diminutive and normal appendix series 3, image 88. Decompressed terminal ileum and no dilated small bowel. Mostly decompressed stomach and duodenum. No free air or free fluid identified. Vascular/Lymphatic: Mild Aortoiliac calcified atherosclerosis. Major arterial structures in the abdomen and pelvis are patent. Portal venous system is patent on the delayed images. No lymphadenopathy identified.  Reproductive: Chronic surgical clips adjacent to the prostate which appears somewhat indistinct but not significantly changed since 2019. Chronic right inguinal hernia repair with surgical clips is stable. Other: No convincing pelvis free fluid. Musculoskeletal: Osteopenia. Moderate chronic L1 compression fracture does not appear significantly changed from 2019. Chronic L2 superior endplate compression fracture new since 2019 but present on the 2021 radiographs. Chronic L3 compression fracture stable since 2019. Chronic L4 compression fracture stable since 2019. Moderate to severe L5 compression fracture is new since 2019 and appears progressed since 2021 although the inferior endplate was  compressed at that time. Sacrum, SI joints, pelvis, and proximal femurs appear intact. IMPRESSION: 1. Acute distracted horizontal fracture through the T12 vertebral body which is chronically progressed (near vertebra plana). No posterior element discontinuity to indicate this is a bona fide Chance fracture, but this might be an unstable injury. No spondylolisthesis. Mild prevertebral hematoma or edema at T12 but no CT evidence of spinal epidural hematoma. Recommend Neurosurgery consultation. 2. Widespread other spinal, lumbar compression fractures. But none appear to be brand new when compared to 2021 exams. 2021 radiographs. 3. No other acute traumatic injury identified in the chest, abdomen, or pelvis. 4. Small layering left pleural effusion is new since 2022, favor transudate. Lower lung atelectasis and scarring with no convincing pneumonia. 5. Distended urinary bladder (890 mL).  Query urinary retention. 6.  Aortic Atherosclerosis (ICD10-I70.0). Electronically Signed: By: Genevie Ann M.D. On: 08/02/2022 06:59   CT Head Wo Contrast  Result Date: 08/02/2022 CLINICAL DATA:  89 year old male with history of head trauma from a fall. EXAM: CT HEAD WITHOUT CONTRAST CT CERVICAL SPINE WITHOUT CONTRAST TECHNIQUE: Multidetector CT  imaging of the head and cervical spine was performed following the standard protocol without intravenous contrast. Multiplanar CT image reconstructions of the cervical spine were also generated. RADIATION DOSE REDUCTION: This exam was performed according to the departmental dose-optimization program which includes automated exposure control, adjustment of the mA and/or kV according to patient size and/or use of iterative reconstruction technique. COMPARISON:  Head CT 06/02/2022. FINDINGS: CT HEAD FINDINGS Brain: Moderate cerebral and mild cerebellar atrophy. Patchy and confluent areas of decreased attenuation are noted throughout the deep and periventricular white matter of the cerebral hemispheres bilaterally, compatible with chronic microvascular ischemic disease. Several well-defined areas of low attenuation in the basal ganglia bilaterally, compatible with old lacunar infarcts. No evidence of acute infarction, hemorrhage, hydrocephalus, extra-axial collection or mass lesion/mass effect. Vascular: No hyperdense vessel or unexpected calcification. Skull: Normal. Negative for fracture or focal lesion. Sinuses/Orbits: No acute finding. Other: None. CT CERVICAL SPINE FINDINGS Alignment: Normal. Skull base and vertebrae: Chronic appearing compression fracture of C5 with 25% loss of anterior vertebral body height. No acute fracture. No primary bone lesion or focal pathologic process. Soft tissues and spinal canal: No prevertebral fluid or swelling. No visible canal hematoma. Disc levels: Severe multilevel degenerative disc disease, most pronounced at C4-C5, C5-C6 and C6-C7. Severe multilevel facet arthropathy bilaterally. Upper chest: Unremarkable. Other: None IMPRESSION: 1. No evidence of significant acute traumatic injury to the skull, brain or cervical spine. 2. 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 3. Severe multilevel degenerative disc disease and cervical spondylosis, as above. Electronically Signed   By: Vinnie Langton M.D.   On: 08/02/2022 06:40   CT Cervical Spine Wo Contrast  Result Date: 08/02/2022 CLINICAL DATA:  89 year old male with history of head trauma from a fall. EXAM: CT HEAD WITHOUT CONTRAST CT CERVICAL SPINE WITHOUT CONTRAST TECHNIQUE: Multidetector CT imaging of the head and cervical spine was performed following the standard protocol without intravenous contrast. Multiplanar CT image reconstructions of the cervical spine were also generated. RADIATION DOSE REDUCTION: This exam was performed according to the departmental dose-optimization program which includes automated exposure control, adjustment of the mA and/or kV according to patient size and/or use of iterative reconstruction technique. COMPARISON:  Head CT 06/02/2022. FINDINGS: CT HEAD FINDINGS Brain:  Moderate cerebral and mild cerebellar atrophy. Patchy and confluent areas of decreased attenuation are noted throughout the deep and periventricular white matter of the cerebral hemispheres bilaterally, compatible with chronic microvascular ischemic disease. Several well-defined areas of low attenuation in the basal ganglia bilaterally, compatible with old lacunar infarcts. No evidence of acute infarction, hemorrhage, hydrocephalus, extra-axial collection or mass lesion/mass effect. Vascular: No hyperdense vessel or unexpected calcification. Skull: Normal. Negative for fracture or focal lesion. Sinuses/Orbits: No acute finding. Other: None. CT CERVICAL SPINE FINDINGS Alignment: Normal. Skull base and vertebrae: Chronic appearing compression fracture of C5 with 25% loss of anterior vertebral body height. No acute fracture. No primary bone lesion or focal pathologic process. Soft tissues and spinal canal: No prevertebral fluid  or swelling. No visible canal hematoma. Disc levels: Severe multilevel degenerative disc disease, most pronounced at C4-C5, C5-C6 and C6-C7. Severe multilevel facet arthropathy bilaterally. Upper chest: Unremarkable. Other: None IMPRESSION: 1. No evidence of significant acute traumatic injury to the skull, brain or cervical spine. 2. 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 3. Severe multilevel degenerative disc disease and cervical spondylosis, as above. Electronically Signed   By: Vinnie Langton M.D.   On: 08/02/2022 06:40   DG Pelvis Portable  Result Date: 08/02/2022 CLINICAL DATA:  89 year old male status post fall. EXAM: PORTABLE PELVIS 1-2 VIEWS COMPARISON:  CT Abdomen and Pelvis 04/02/2018. FINDINGS: Portable AP supine view at 0447 hours. Chronic pelvic floor surgical clips redemonstrated and stable. Femoral heads remain normally located. Hip joint spaces appear stable. The pelvis is mildly rotated to the right. No pelvis fracture identified. SI joints and symphysis appear within normal limits. Grossly intact proximal femurs. Partially visible chronic lumbar compression fractures and dextroconvex scoliosis. Negative visible bowel gas pattern. IMPRESSION: No acute fracture or dislocation identified about the pelvis. Electronically Signed   By: Genevie Ann M.D.   On: 08/02/2022 05:00   DG Chest Port 1 View  Result Date: 08/02/2022 CLINICAL DATA:  88 year old male status post fall. EXAM: PORTABLE CHEST 1 VIEW COMPARISON:  Portable chest 06/05/2022 and earlier. FINDINGS: Portable AP upright view at 0445 hours. Lordotic positioning. Similar somewhat low lung volumes. Stable left chest dual lead cardiac pacemaker. Mediastinal contours are within normal limits. Visualized tracheal air column  is within normal limits. Curvilinear chronic left lung base scarring, demonstrated by CT in 2022. No pneumothorax, pulmonary edema, pleural effusion or acute pulmonary opacity. Paucity of bowel gas in the visible abdomen. Stable visualized osseous structures. IMPRESSION: No acute cardiopulmonary abnormality. Electronically Signed   By: Genevie Ann M.D.   On: 08/02/2022 04:59    Procedures Procedures    Medications Ordered in ED Medications  iohexol (OMNIPAQUE) 350 MG/ML injection 75 mL (75 mLs Intravenous Contrast Given 08/02/22 [id number])    ED Course/ Medical Decision Making/ A&P                             Medical Decision Making Amount and/or Complexity of Data Reviewed Labs: ordered. Radiology: ordered.  Risk Prescription drug management. Decision regarding hospitalization.   Patient with history of Parkinson's, complete heart block status post pacemaker placement, DVT on Eliquis here for evaluation of following 2 falls in the last 24 hours.  He does complain of acute on chronic back pain and has global weakness although  this is greatest in his lower extremities compared to upper extremities.  He received trauma scans that are significant for T12 acute on chronic fracture.  Discussed with Dr. Reatha Armour with neurosurgery-will see the patient in consult, recommends MRI to further evaluate injury.  Discussed with patient as well as wife over the phone findings of studies.  Given patient's inability to safely ambulate with significant weakness, new injury recommend admission for observation.  Hospitalist consulted for admission for ongoing care.        Final Clinical Impression(s) / ED Diagnoses Final diagnoses:  Fall, initial encounter  Closed fracture of twelfth thoracic vertebra, unspecified fracture morphology, initial encounter [facility])    Rx / [place] Orders ED Discharge Orders     None         Quintella Reichert, MD 08/02/22 [phone]

## 2022-08-02 NOTE — Progress Notes (Signed)
Orthopedic Tech Progress Note Patient Details:  Kenneth Bowen [birth year] [id number]  Order for TLSO [employer] called into [facility].  Patient ID: [name], male   DOB: 03-Aug-1930, 89 y.o.   MRN: [id number]  Carin Primrose 08/02/2022, 5:08 PM

## 2022-08-02 NOTE — H&P (Signed)
History and Physical    Patient: Kenneth Bowen:[id number] DOB: [birth year] DOA: 08/02/2022 DOS: the patient was seen and examined on 08/02/2022 PCP: Sueanne Margarita, DO  Patient coming from: [facility] via EMS  Chief Complaint:  Chief Complaint  Patient presents with   Fall    Pt on blood thinner eliquis.    HPI: Kenneth Bowen is a 89 y.o. male with medical history significant of atrial fibrillation, complete heart block s/p pacemaker, DVT on Eliquis, chronic back pain, and parkinsonism who presents after having a fall at home.  Patient lives with his wife in independent living in ambulates with the use of a walker.  History is somewhat difficult to obtain from the patient as he seems to confused most recent events.  It seems patient notes 3 separate falls in the last couple days.  Patient notes that he fell by the elevator and nearby residents helped him up.  Then noted a another fall while going into the kitchen using his walker where his legs got weak and he fell into the walker.  Lastly reports a fall while trying to get up to use the bathroom this morning at around 2:30 AM. Patient reports that he slid out of bed. He hit his face, but did not lose conscious.  Back in January of this year patient had went into complete heart block requiring pacemaker placement.  In the emergency department patient was noted to be afebrile with stable vital signs.  Labs significant for WBC 3.8, hemoglobin 10.9, platelets 141, sodium 130, and AST 42.  Patient underwent CT scan of the head, cervical spine, chest, abdomen, and pelvis.  Imaging noted no acute or traumatic injury in the skull, brain, or cervical spine.  Also found to have acute distracted horizontal fracture through the T12 vertebrae which is chronically progressed with mild paravertebral hematoma edema at T12 without CT evidence of a spinal epidural hematoma, severe multilevel degenerative disc disease, and cervical  spondylosis.  Neurosurgery had been consulted and recommended MRI of the lumbar spine.  Review of Systems: As mentioned in the history of present illness. All other systems reviewed and are negative. Past Medical History:  Diagnosis Date   Bradycardia 06/02/2022   Colon polyps    Diverticulosis    Encephalopathy acute 06/02/2022   Hypothermia 06/02/2022   Prostate cancer ([place]) 2015   radiation finished Nov 2015   Seizures ([place]) 07/2016   Sepsis ([place]) 06/02/2022   Unresponsiveness 07/28/2016   Past Surgical History:  Procedure Laterality Date   colonscopy     INGUINAL HERNIA REPAIR     KNEE SURGERY     PACEMAKER IMPLANT N/A 06/04/2022   Procedure: PACEMAKER IMPLANT;  Surgeon: Evans Lance, MD;  Location: [facility];  Service: Cardiovascular;  Laterality: N/A;   PROSTATE BIOPSY     TEMPORARY PACEMAKER N/A 06/03/2022   Procedure: TEMPORARY PACEMAKER;  Surgeon: Jolaine Artist, MD;  Location: [facility];  Service: Cardiovascular;  Laterality: N/A;   Social History:  reports that he has never smoked. He has never used smokeless tobacco. He reports current alcohol use of about 7.0 standard drinks of alcohol per week. He reports that he does not use drugs.  No Known Allergies  Family History  Problem Relation Age of Onset   Alcoholism Father    Cancer Neg Hx    Colon cancer Neg Hx     Prior to Admission medications   Medication Sig Start Date  End Date Taking? Authorizing Provider  mupirocin ointment (BACTROBAN) 2 % APPLY SPARINGLY TO AFFECTED AREA TWICE A DAY 07/09/22  Yes [provider]  traZODone (DESYREL) 50 MG tablet  12/13/20  Yes [provider]  ALPRAZolam (XANAX) 0.5 MG tablet Take by mouth.    [provider]  apixaban (ELIQUIS) 2.5 MG TABS tablet Take 1 tablet (2.5 mg total) by mouth 2 (two) times daily. Resume from 06/10/22 06/10/22   Aline August, MD  ARTIFICIAL TEAR OP Place 1 drop into both eyes daily as needed (dry  eye).    [provider]  cholecalciferol (VITAMIN D) 1000 UNITS tablet Take 2,000 Units by mouth daily.     [provider]  desoximetasone (TOPICORT) 0.25 % cream Apply 1 Application topically at bedtime. 06/17/19   [provider]  divalproex (DEPAKOTE ER) 250 MG 24 hr tablet Take 3 tablets (750 mg total) by mouth daily. 06/09/22   Aline August, MD  Ferrous Sulfate (IRON PO) Take 1 tablet by mouth daily.    [provider]  folic acid (FOLVITE) 1 MG tablet Take 1 tablet (1 mg total) by mouth daily. 06/10/22   Aline August, MD  furosemide (LASIX) 40 MG tablet Take 40 mg by mouth daily.    [provider]  hydrocortisone 2.5 % cream Apply 1 Application topically 2 (two) times daily as needed for other (Skin irritation). 06/23/19   [provider]  lactulose (CHRONULAC) 10 GM/15ML solution Take 15 mLs (10 g total) by mouth 2 (two) times daily. 06/09/22   Aline August, MD  mometasone (ELOCON) 0.1 % ointment Apply 1 application topically 2 (two) times daily as needed (rash).  06/23/19   [provider]  Multiple Vitamin (MULTIVITAMIN) tablet Take 1 tablet by mouth daily.    [provider]  omeprazole (PRILOSEC) 20 MG capsule Take 20 mg by mouth daily. 12/29/18   [provider]  tamsulosin (FLOMAX) 0.4 MG CAPS capsule TAKE 1 CAPSULE (0.4 MG TOTAL) BY MOUTH DAILY AFTER SUPPER. Patient taking differently: Take 0.4 mg by mouth daily. 03/21/16   Tyler Pita, MD  topiramate (TOPAMAX) 50 MG tablet Take 1 tablet (50 mg total) by mouth at bedtime. 08/22/21   Frann Rider, NP    Physical Exam: Vitals:   08/02/22 0420 08/02/22 0421 08/02/22 0600  BP: (!) 148/84  (!) 147/88  Pulse: 74  (!) 59  Resp: 18  10  Temp: 97.6 F (36.4 C)    SpO2: 97%  94%  Weight:  81.2 kg   Height:  5\' 10"  (1.778 m)    Constitutional: Elderly male NAD, calm, comfortable Eyes: PERRL, lids and conjunctivae normal.  Patient with bruising noted  of the left brow, side of cheek, and ear ENMT: Mucous membranes are moist.   Neck: normal, supple,   Respiratory: clear to auscultation bilaterally, no wheezing, no crackles. Normal respiratory effort. No accessory muscle use.  Cardiovascular: Regular rate and rhythm, no murmurs / rubs / gallops.  1+ pitting edema of the right lower extremity and trace lower extremity edema of the left.  Abdomen: no tenderness, no masses palpated.  Bowel sounds positive.  Musculoskeletal: no clubbing / cyanosis.   Skin: Bruising noted of the bilateral upper extremities and shoulders with skin tears currently bandaged Neurologic: CN 2-12 grossly intact.  Mild tremor appreciated. Psychiatric: Patient intermittently mixes up dates, but grossly is alert and oriented x 3.  Data Reviewed:  EKG revealed a sinus or ectopic atrial rhythm with  premature atrial complexes.  Reviewed labs, imaging, and pertinent records reviewed as noted above.  Assessment and Plan:  T12 fracture secondary to falls Patient presents after having 3 separate falls recently.  Found to have an acute distracted horizontal fracture through the T12 vertebrae which is chronically progressed with mild paravertebral hematoma edema at T12.  MRI of the back -Admit to a telemetry bed -Up with assistance -Follow-up MRI of the lumbar spine -Neurosurgery consulted,  will follow-up for any further recommendations  Pancytopenia Acute on chronic.  Labs noted WBC 3.8, hemoglobin 10.9, and platelets 141.  Leukopenia appears to be new, but hemoglobin and platelet count appear to be near patient's baseline. -Recheck CBC tomorrow morning  History of DVT on chronic anticoagulation Patient with prior history of DVT of the right lower extremity noted back in 06/2020.  He is on Eliquis for treatment. -Continue Eliquis    Seizure disorder Patient previously had 2 seizures back in 2012.  Denies any recent seizures -Continue Depakote  Elevated AST Acute.   AST mildly elevated at 42. -Follow-up  BPH -Continue Flomax  DVT prophylaxis: Eliquis Advance Care Planning:   Code Status: DNR   Consults: Neurosurgery  Family Communication: Wife updated over the phone  Severity of Illness: The appropriate patient status for this patient is OBSERVATION. Observation status is judged to be reasonable and necessary in order to provide the required intensity of service to ensure the patient's safety. The patient's presenting symptoms, physical exam findings, and initial radiographic and laboratory data in the context of their medical condition is felt to place them at decreased risk for further clinical deterioration. Furthermore, it is anticipated that the patient will be medically stable for discharge from the hospital within 2 midnights of admission.   Author: Norval Morton, MD 08/02/2022 7:52 AM  For on call review [email or web address].

## 2022-08-02 NOTE — ED Triage Notes (Signed)
Pt had a mechanical fall, slipping going to the bathroom. Pt did not hit head or LOC. Pt was found by EMS prone on the floor by his nightstand. Pt has existing bruises on the left side of face from a fall yesterday but refused to come to hospital this morning. Pt is complaining of chronic lower back pain. GCS 15. PMH pacemaker, DVT and Parkinson's.

## 2022-08-02 NOTE — Plan of Care (Signed)
  Problem: Education: Goal: Knowledge of General Education information will improve Description: Including pain rating scale, medication(s)/side effects and non-pharmacologic comfort measures Outcome: Progressing   Problem: Safety: Goal: Ability to remain free from injury will improve Outcome: Progressing   

## 2022-08-02 NOTE — ED Notes (Signed)
ED TO INPATIENT HANDOFF REPORT  ED Nurse Name and Phone #: Vikki Ports [phone]  S Name/Age/Gender Kenneth Bowen 89 y.o. male Room/Bed: 021C/021C  Code Status   Code Status: Prior  Home/SNF/Other Home Patient oriented to: self, place, time, and situation Is this baseline? Yes   Triage Complete: Triage complete  Chief Complaint Falls [phone].[phone]]  Triage Note Pt had a mechanical fall, slipping going to the bathroom. Pt did not hit head or LOC. Pt was found by EMS prone on the floor by his nightstand. Pt has existing bruises on the left side of face from a fall yesterday but refused to come to hospital this morning. Pt is complaining of chronic lower back pain. GCS 15. PMH pacemaker, DVT and Parkinson's.    Allergies No Known Allergies  Level of Care/Admitting Diagnosis ED Disposition     ED Disposition  Admit   Condition  --   Comment  Hospital Area: [facility] [[id number]]  Level of Care: Telemetry Medical [104]  May place patient in observation at [facility] or [place] if equivalent level of care is available:: No  Covid Evaluation: Asymptomatic - no recent exposure (last 10 days) testing not required  Diagnosis: Falls [[id number]]  Admitting Physician: Norval Morton [id number]  Attending Physician: Norval Morton [id number]          B Medical/Surgery History Past Medical History:  Diagnosis Date   Bradycardia 06/02/2022   Colon polyps    Diverticulosis    Encephalopathy acute 06/02/2022   Hypothermia 06/02/2022   Prostate cancer ([place]) 2015   radiation finished Nov 2015   Seizures ([place]) 07/2016   Sepsis ([place]) 06/02/2022   Unresponsiveness 07/28/2016   Past Surgical History:  Procedure Laterality Date   colonscopy     INGUINAL HERNIA REPAIR     KNEE SURGERY     PACEMAKER IMPLANT N/A 06/04/2022   Procedure: PACEMAKER IMPLANT;  Surgeon: Evans Lance, MD;  Location: [facility];  Service: Cardiovascular;  Laterality: N/A;    PROSTATE BIOPSY     TEMPORARY PACEMAKER N/A 06/03/2022   Procedure: TEMPORARY PACEMAKER;  Surgeon: Jolaine Artist, MD;  Location: [facility];  Service: Cardiovascular;  Laterality: N/A;     A IV Location/Drains/Wounds Patient Lines/Drains/Airways Status     Active Line/Drains/Airways     Name Placement date Placement time Site Days   Peripheral IV 08/02/22 20 G 1" Anterior;Right Forearm 08/02/22  0456  Forearm  less than 1   External Urinary Catheter 06/06/22  0200  --  57   Wound / Incision (Open or Dehisced) 06/02/22 Non-pressure wound Elbow Left;Posterior 06/02/22  --  Elbow  61   Wound / Incision (Open or Dehisced) 06/02/22 Skin tear Elbow Posterior;Right 06/02/22  --  Elbow  61   Wound / Incision (Open or Dehisced) 06/02/22 Non-pressure wound Leg Right;Lower;Lateral 06/02/22  --  Leg  61   Wound / Incision (Open or Dehisced) 06/02/22 Non-pressure wound Foot Right;Lateral 06/02/22  --  Foot  61   Wound / Incision (Open or Dehisced) 06/03/22 Skin tear Arm Posterior;Right;Upper 06/03/22  0130  Arm  60            Intake/Output Last 24 hours  Intake/Output Summary (Last 24 hours) at 08/02/2022 0856 Last data filed at 08/02/2022 0848 Gross per 24 hour  Intake --  Output 600 ml  Net -600 ml    Labs/Imaging Results for orders placed or performed during the hospital encounter  of 08/02/22 (from the past 48 hour(s))  Comprehensive metabolic panel     Status: Abnormal   Collection Time: 08/02/22  4:46 AM  Result Value Ref Range   Sodium 130 (L) 135 - 145 mmol/L   Potassium 4.2 3.5 - 5.1 mmol/L    Comment: HEMOLYSIS AT THIS LEVEL MAY AFFECT RESULT   Chloride 98 98 - 111 mmol/L   CO2 25 22 - 32 mmol/L   Glucose, Bld 104 (H) 70 - 99 mg/dL    Comment: Glucose reference range applies only to samples taken after fasting for at least 8 hours.   BUN 13 8 - 23 mg/dL   Creatinine, Ser 0.58 (L) 0.61 - 1.24 mg/dL   Calcium 8.9 8.9 - 10.3 mg/dL   Total Protein 6.1 (L) 6.5 -  8.1 g/dL   Albumin 2.9 (L) 3.5 - 5.0 g/dL   AST 42 (H) 15 - 41 U/L    Comment: HEMOLYSIS AT THIS LEVEL MAY AFFECT RESULT   ALT 18 0 - 44 U/L    Comment: HEMOLYSIS AT THIS LEVEL MAY AFFECT RESULT   Alkaline Phosphatase 80 38 - 126 U/L   Total Bilirubin 0.9 0.3 - 1.2 mg/dL    Comment: HEMOLYSIS AT THIS LEVEL MAY AFFECT RESULT   GFR, Estimated >60 >60 mL/min    Comment: (NOTE) Calculated using the CKD-EPI Creatinine Equation (2021)    Anion gap 7 5 - 15    Comment: Performed at [facility], [place] [address], [place], [place] [address]  CBC with Differential     Status: Abnormal   Collection Time: 08/02/22  4:46 AM  Result Value Ref Range   WBC 3.8 (L) 4.0 - 10.5 K/uL   RBC 3.38 (L) 4.22 - 5.81 MIL/uL   Hemoglobin 10.9 (L) 13.0 - 17.0 g/dL   HCT 32.7 (L) 39.0 - 52.0 %   MCV 96.7 80.0 - 100.0 fL   MCH 32.2 26.0 - 34.0 pg   MCHC 33.3 30.0 - 36.0 g/dL   RDW 14.0 11.5 - 15.5 %   Platelets 141 (L) 150 - 400 K/uL   nRBC 0.0 0.0 - 0.2 %   Neutrophils Relative % 69 %   Neutro Abs 2.6 1.7 - 7.7 K/uL   Lymphocytes Relative 16 %   Lymphs Abs 0.6 (L) 0.7 - 4.0 K/uL   Monocytes Relative 11 %   Monocytes Absolute 0.4 0.1 - 1.0 K/uL   Eosinophils Relative 1 %   Eosinophils Absolute 0.0 0.0 - 0.5 K/uL   Basophils Relative 1 %   Basophils Absolute 0.0 0.0 - 0.1 K/uL   Immature Granulocytes 2 %   Abs Immature Granulocytes 0.06 0.00 - 0.07 K/uL    Comment: Performed at [facility], [place] [address] [place], [place] [address]  I-stat chem 8, ED     Status: Abnormal   Collection Time: 08/02/22  5:03 AM  Result Value Ref Range   Sodium 133 (L) 135 - 145 mmol/L   Potassium 4.0 3.5 - 5.1 mmol/L   Chloride 96 (L) 98 - 111 mmol/L   BUN 15 8 - 23 mg/dL   Creatinine, Ser 0.60 (L) 0.61 - 1.24 mg/dL   Glucose, Bld 103 (H) 70 - 99 mg/dL    Comment: Glucose reference range applies only to samples taken after fasting for at least 8 hours.   Calcium, Ion 1.27 1.15 - 1.40 mmol/L   TCO2 27 22  - 32 mmol/L   Hemoglobin 11.6 (L)  13.0 - 17.0 g/dL   HCT 34.0 (L) 39.0 - 52.0 %   CT CHEST ABDOMEN PELVIS W CONTRAST  Addendum Date: 08/02/2022   ADDENDUM REPORT: 08/02/2022 07:06 ADDENDUM: Study discussed by telephone with Dr. Quintella Reichert on 08/02/2022 at 0702 hours. Electronically Signed   By: Genevie Ann M.D.   On: 08/02/2022 07:06   Result Date: 08/02/2022 CLINICAL DATA:  89 year old male status post fall.  Found down. EXAM: CT CHEST, ABDOMEN, AND PELVIS WITH CONTRAST TECHNIQUE: Multidetector CT imaging of the chest, abdomen and pelvis was performed following the standard protocol during bolus administration of intravenous contrast. RADIATION DOSE REDUCTION: This exam was performed according to the departmental dose-optimization program which includes automated exposure control, adjustment of the mA and/or kV according to patient size and/or use of iterative reconstruction technique. CONTRAST:  39mL OMNIPAQUE IOHEXOL 350 MG/ML SOLN COMPARISON:  Cervical spine CT, trauma portable chest and pelvis radiographs today. Chest CT 01/02/2021.  CT Abdomen and Pelvis 04/02/2018. Lumbar radiographs 07/21/2019. FINDINGS: CT CHEST FINDINGS Cardiovascular: New left chest cardiac pacemaker device since 2022. Mildly increased cardiac size, borderline cardiomegaly. No pericardial effusion. Calcified coronary artery plaque. Comparatively mild thoracic aortic plaque. Stable mild tortuosity of the thoracic aorta. Mediastinum/Nodes: Negative. No mediastinal hematoma or lymphadenopathy. Lungs/Pleura: Small layering left pleural effusion with simple fluid density is new since 2022, favor transudate. Mildly lower lung volumes overall. Major airways remain patent. Chronic streaky left lower lobe, lingula lung opacity has not significantly changed when allowing for effusion and lower lung volumes and is likely chronic scarring. Similar platelike opacity in the medial segment of the right middle lobe today. Similar right lower lobe  ground-glass and platelike opacity. No pneumothorax. No convincing pulmonary contusion. No convincing pneumonia. Musculoskeletal: Diffuse osteopenia. Widespread flowing thoracic endplate osteophytes result in multilevel thoracic interbody ankylosis. Chronic T2 inferior endplate compression fracture is stable. Chronic T11 mild compression fracture is stable. Chronic severe T12 compression fracture with near vertebra plana. However, acute superimposed distracted horizontal type fracture through the compressed T12 vertebral body (series 7, image 68). The T12 posterior elements appear to remain intact. Superimposed chronic L1 superior endplate compression and the L1 posterior elements also appear intact. No spondylolisthesis at this level. Mild prevertebral hematoma or edema ventral to T12 but no CT evidence of spinal epidural hematoma. Visible shoulder osseous structures and sternum appear to remain intact. And no acute rib fracture is identified. CT ABDOMEN PELVIS FINDINGS Hepatobiliary: Liver and gallbladder appear intact. No perihepatic fluid. Pancreas: Partial fatty atrophy. Spleen: Diminutive, intact.  No perisplenic fluid. Adrenals/Urinary Tract: Stable and normal for age adrenal glands and kidneys. Symmetric renal enhancement and contrast excretion with decompressed ureters. Distended urinary bladder (890 mL), otherwise unremarkable. Stomach/Bowel: Redundant but mostly decompressed sigmoid colon in the pelvis and bilateral lower quadrants. Upstream retained stool from the right colon to the descending colon. No large bowel inflammation. Diminutive and normal appendix series 3, image 88. Decompressed terminal ileum and no dilated small bowel. Mostly decompressed stomach and duodenum. No free air or free fluid identified. Vascular/Lymphatic: Mild Aortoiliac calcified atherosclerosis. Major arterial structures in the abdomen and pelvis are patent. Portal venous system is patent on the delayed images. No  lymphadenopathy identified. Reproductive: Chronic surgical clips adjacent to the prostate which appears somewhat indistinct but not significantly changed since 2019. Chronic right inguinal hernia repair with surgical clips is stable. Other: No convincing pelvis free fluid. Musculoskeletal: Osteopenia. Moderate chronic L1 compression fracture does not appear significantly changed from 2019. Chronic L2 superior endplate  compression fracture new since 2019 but present on the 2021 radiographs. Chronic L3 compression fracture stable since 2019. Chronic L4 compression fracture stable since 2019. Moderate to severe L5 compression fracture is new since 2019 and appears progressed since 2021 although the inferior endplate was compressed at that time. Sacrum, SI joints, pelvis, and proximal femurs appear intact. IMPRESSION: 1. Acute distracted horizontal fracture through the T12 vertebral body which is chronically progressed (near vertebra plana). No posterior element discontinuity to indicate this is a bona fide Chance fracture, but this might be an unstable injury. No spondylolisthesis. Mild prevertebral hematoma or edema at T12 but no CT evidence of spinal epidural hematoma. Recommend Neurosurgery consultation. 2. Widespread other spinal, lumbar compression fractures. But none appear to be brand new when compared to 2021 exams. 2021 radiographs. 3. No other acute traumatic injury identified in the chest, abdomen, or pelvis. 4. Small layering left pleural effusion is new since 2022, favor transudate. Lower lung atelectasis and scarring with no convincing pneumonia. 5. Distended urinary bladder (890 mL).  Query urinary retention. 6.  Aortic Atherosclerosis (ICD10-I70.0). Electronically Signed: By: Genevie Ann M.D. On: 08/02/2022 06:59   CT Head Wo Contrast  Result Date: 08/02/2022 CLINICAL DATA:  89 year old male with history of head trauma from a fall. EXAM: CT HEAD WITHOUT CONTRAST CT CERVICAL SPINE WITHOUT CONTRAST  TECHNIQUE: Multidetector CT imaging of the head and cervical spine was performed following the standard protocol without intravenous contrast. Multiplanar CT image reconstructions of the cervical spine were also generated. RADIATION DOSE REDUCTION: This exam was performed according to the departmental dose-optimization program which includes automated exposure control, adjustment of the mA and/or kV according to patient size and/or use of iterative reconstruction technique. COMPARISON:  Head CT 06/02/2022. FINDINGS: CT HEAD FINDINGS Brain: Moderate cerebral and mild cerebellar atrophy. Patchy and confluent areas of decreased attenuation are noted throughout the deep and periventricular white matter of the cerebral hemispheres bilaterally, compatible with chronic microvascular ischemic disease. Several well-defined areas of low attenuation in the basal ganglia bilaterally, compatible with old lacunar infarcts. No evidence of acute infarction, hemorrhage, hydrocephalus, extra-axial collection or mass lesion/mass effect. Vascular: No hyperdense vessel or unexpected calcification. Skull: Normal. Negative for fracture or focal lesion. Sinuses/Orbits: No acute finding. Other: None. CT CERVICAL SPINE FINDINGS Alignment: Normal. Skull base and vertebrae: Chronic appearing compression fracture of C5 with 25% loss of anterior vertebral body height. No acute fracture. No primary bone lesion or focal pathologic process. Soft tissues and spinal canal: No prevertebral fluid or swelling. No visible canal hematoma. Disc levels: Severe multilevel degenerative disc disease, most pronounced at C4-C5, C5-C6 and C6-C7. Severe multilevel facet arthropathy bilaterally. Upper chest: Unremarkable. Other: None IMPRESSION: 1. No evidence of significant acute traumatic injury to the skull, brain or cervical spine. 2. 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 3. Severe multilevel degenerative disc disease and cervical spondylosis, as above. Electronically Signed   By: Vinnie Langton M.D.   On: 08/02/2022 06:40   CT Cervical Spine Wo Contrast  Result Date: 08/02/2022 CLINICAL DATA:  89 year old male with history of head trauma from a fall. EXAM: CT HEAD WITHOUT CONTRAST CT CERVICAL SPINE WITHOUT CONTRAST TECHNIQUE: Multidetector CT imaging of the head and cervical spine was performed following the standard protocol without intravenous contrast. Multiplanar CT image reconstructions of the cervical spine were also  generated. RADIATION DOSE REDUCTION: This exam was performed according to the departmental dose-optimization program which includes automated exposure control, adjustment of the mA and/or kV according to patient size and/or use of iterative reconstruction technique. COMPARISON:  Head CT 06/02/2022. FINDINGS: CT HEAD FINDINGS Brain: Moderate cerebral and mild cerebellar atrophy. Patchy and confluent areas of decreased attenuation are noted throughout the deep and periventricular white matter of the cerebral hemispheres bilaterally, compatible with chronic microvascular ischemic disease. Several well-defined areas of low attenuation in the basal ganglia bilaterally, compatible with old lacunar infarcts. No evidence of acute infarction, hemorrhage, hydrocephalus, extra-axial collection or mass lesion/mass effect. Vascular: No hyperdense vessel or unexpected calcification. Skull: Normal. Negative for fracture or focal lesion. Sinuses/Orbits: No acute finding. Other: None. CT CERVICAL SPINE FINDINGS Alignment: Normal. Skull base and vertebrae: Chronic appearing compression fracture of C5 with 25% loss of anterior vertebral body height. No acute fracture. No primary bone lesion or focal pathologic process. Soft tissues and  spinal canal: No prevertebral fluid or swelling. No visible canal hematoma. Disc levels: Severe multilevel degenerative disc disease, most pronounced at C4-C5, C5-C6 and C6-C7. Severe multilevel facet arthropathy bilaterally. Upper chest: Unremarkable. Other: None IMPRESSION: 1. No evidence of significant acute traumatic injury to the skull, brain or cervical spine. 2. 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 3. Severe multilevel degenerative disc disease and cervical spondylosis, as above. Electronically Signed   By: Vinnie Langton M.D.   On: 08/02/2022 06:40   DG Pelvis Portable  Result Date: 08/02/2022 CLINICAL DATA:  89 year old male status post fall. EXAM: PORTABLE PELVIS 1-2 VIEWS COMPARISON:  CT Abdomen and Pelvis 04/02/2018. FINDINGS: Portable AP supine view at 0447 hours. Chronic pelvic floor surgical clips redemonstrated and stable. Femoral heads remain normally located. Hip joint spaces appear stable. The pelvis is mildly rotated to the right. No pelvis fracture identified. SI joints and symphysis appear within normal limits. Grossly intact proximal femurs. Partially visible chronic lumbar compression fractures and dextroconvex scoliosis. Negative visible bowel gas pattern. IMPRESSION: No acute fracture or dislocation identified about the pelvis. Electronically Signed   By: Genevie Ann M.D.   On: 08/02/2022 05:00   DG Chest Port 1 View  Result Date: 08/02/2022 CLINICAL DATA:  89 year old male status post fall. EXAM: PORTABLE CHEST 1 VIEW COMPARISON:  Portable chest 06/05/2022 and earlier. FINDINGS: Portable AP upright view at 0445 hours. Lordotic positioning. Similar somewhat low lung volumes. Stable left chest dual lead cardiac pacemaker. Mediastinal contours are within normal  limits. Visualized tracheal air column is within normal limits. Curvilinear chronic left lung base scarring, demonstrated by CT in 2022. No pneumothorax, pulmonary edema, pleural effusion or acute pulmonary opacity. Paucity of bowel gas in the visible abdomen. Stable visualized osseous structures. IMPRESSION: No acute cardiopulmonary abnormality. Electronically Signed   By: Genevie Ann M.D.   On: 08/02/2022 04:59    Pending Labs Unresulted Labs (From admission, onward)     Start     Ordered   08/02/22 0434  Urinalysis, Routine w reflex microscopic -Urine, Clean Catch  Once,   URGENT       Question:  Specimen Source  Answer:  Urine, Clean Catch   08/02/22 0434            Vitals/Pain Today's Vitals   08/02/22 0419 08/02/22 0420 08/02/22 0421 08/02/22 0600  BP:  Marland Kitchen)  148/84  (!) 147/88  Pulse:  74  (!) 59  Resp:  18  10  Temp:  97.6 F (36.4 C)    SpO2:  97%  94%  Weight:   81.2 kg   Height:   5\' 10"  (1.778 m)   PainSc: 3        Isolation Precautions No active isolations  Medications Medications  iohexol (OMNIPAQUE) 350 MG/ML injection 75 mL (75 mLs Intravenous Contrast Given 08/02/22 0634)    Mobility walks with device     Focused Assessments Neuro Assessment Handoff:  Swallow screen pass? Yes          Neuro Assessment:   Neuro Checks:      Has TPA been given? No If patient is a Neuro Trauma and patient is going to OR before floor call report to [place] nurse: [phone] or [phone]   R Recommendations: See Admitting Provider Note  Report given to:   Additional Notes: HOH

## 2022-08-03 DIAGNOSIS — S22089A Unspecified fracture of T11-T12 vertebra, initial encounter for closed fracture: Secondary | ICD-10-CM | POA: Diagnosis not present

## 2022-08-03 DIAGNOSIS — W19XXXA Unspecified fall, initial encounter: Secondary | ICD-10-CM | POA: Diagnosis not present

## 2022-08-03 LAB — BASIC METABOLIC PANEL
Anion gap: 6 (ref 5–15)
BUN: 12 mg/dL (ref 8–23)
CO2: 24 mmol/L (ref 22–32)
Calcium: 8.5 mg/dL — ABNORMAL LOW (ref 8.9–10.3)
Chloride: 98 mmol/L (ref 98–111)
Creatinine, Ser: 0.55 mg/dL — ABNORMAL LOW (ref 0.61–1.24)
GFR, Estimated: >60 mL/min (ref >60)
Glucose, Bld: 92 mg/dL (ref 70–99)
Potassium: 3.8 mmol/L (ref 3.5–5.1)
Sodium: 128 mmol/L — ABNORMAL LOW (ref 135–145)

## 2022-08-03 LAB — CBC
HCT: 30.5 % — ABNORMAL LOW (ref 39.0–52.0)
Hemoglobin: 10.6 g/dL — ABNORMAL LOW (ref 13.0–17.0)
MCH: 32.2 pg (ref 26.0–34.0)
MCHC: 34.8 g/dL (ref 30.0–36.0)
MCV: 92.7 fL (ref 80.0–100.0)
Platelets: 150 10*3/uL (ref 150–400)
RBC: 3.29 MIL/uL — ABNORMAL LOW (ref 4.22–5.81)
RDW: 14.1 % (ref 11.5–15.5)
WBC: 4.4 10*3/uL (ref 4.0–10.5)
nRBC: 0 % (ref 0.0–0.2)

## 2022-08-03 MED ORDER — HYDROCODONE-ACETAMINOPHEN 5-325 MG PO TABS
1.0000 | ORAL_TABLET | ORAL | Status: DC | PRN
  Administered 2022-08-03 – 2022-08-05 (×4): 1 via ORAL
  Filled 2022-08-03 (×5): qty 1

## 2022-08-03 MED ORDER — TOPIRAMATE 25 MG PO TABS
50.0000 mg | ORAL_TABLET | Freq: Every day | ORAL | Status: DC
  Administered 2022-08-03 – 2022-08-06 (×4): 50 mg via ORAL
  Filled 2022-08-03 (×4): qty 2

## 2022-08-03 MED ORDER — TRAMADOL HCL 50 MG PO TABS
50.0000 mg | ORAL_TABLET | Freq: Four times a day (QID) | ORAL | Status: DC | PRN
  Administered 2022-08-05: 50 mg via ORAL
  Filled 2022-08-03: qty 1

## 2022-08-03 MED ORDER — FUROSEMIDE 40 MG PO TABS
40.0000 mg | ORAL_TABLET | Freq: Every day | ORAL | Status: DC
  Administered 2022-08-03 – 2022-08-07 (×5): 40 mg via ORAL
  Filled 2022-08-03 (×6): qty 1

## 2022-08-03 MED ORDER — POLYETHYLENE GLYCOL 3350 17 G PO PACK
17.0000 g | PACK | Freq: Two times a day (BID) | ORAL | Status: AC
  Administered 2022-08-03 – 2022-08-04 (×4): 17 g via ORAL
  Filled 2022-08-03 (×5): qty 1

## 2022-08-03 MED ORDER — TRAZODONE HCL 50 MG PO TABS
50.0000 mg | ORAL_TABLET | Freq: Every day | ORAL | Status: DC
  Administered 2022-08-03 – 2022-08-06 (×4): 50 mg via ORAL
  Filled 2022-08-03 (×4): qty 1

## 2022-08-03 MED ORDER — TOPIRAMATE 25 MG PO TABS: 50.0000 mg | ORAL_TABLET | Freq: Every evening | ORAL | Status: DC | PRN

## 2022-08-03 NOTE — NC FL2 (Signed)
[place] LEVEL OF CARE FORM     IDENTIFICATION  Patient Name: RANDOM TOUSANT Birthdate: 22-Nov-1930 Sex: male Admission Date (Current Location): 08/02/2022  [facility] and [place] Number:  Herbalist and Address:  [facility] [place], [place] [address], [place], [place] [address]      Provider Number: [id number]  Attending Physician Name and Address:  Charlynne Cousins, MD  Relative Name and Phone Number:  Darcell, Gerbino [phone]    Current Level of Care: Hospital Recommended Level of Care: [place] Prior Approval Number:    Date Approved/Denied:   PASRR Number: [id number]  Discharge Plan: SNF    Current Diagnoses: Patient Active Problem List   Diagnosis Date Noted   Falls 08/02/2022   History of DVT (deep vein thrombosis) 08/02/2022   Closed T12 fracture ([place]) 08/02/2022   Pancytopenia ([place]) 08/02/2022   Seizure ([place]) 08/02/2022   Elevated AST (SGOT) 08/02/2022   At risk for aspiration 06/15/2022   Palliative care encounter 06/15/2022   Acute respiratory failure with hypoxia ([place]) 06/05/2022   Physical deconditioning 06/05/2022   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   Altered mental status 06/03/2022   Suspected stroke patient last known to be well 2 to 3 hours ago 06/02/2022   Therapeutic drug monitoring 06/10/2017   Partial symptomatic epilepsy with complex partial seizures, not intractable, without status epilepticus ([place]) 12/05/2016   Syncope 07/29/2016   BPH (benign prostatic hyperplasia) 07/28/2016   Dyslipidemia 07/28/2016   Anxiety    History of prostate cancer    TIA (transient ischemic attack) 07/12/2016    Orientation RESPIRATION BLADDER Height & Weight     Self, Time, Situation, Place  Normal Continent Weight: 179 lb (81.2 kg) Height:  5\' 10"  (177.8 cm)  BEHAVIORAL SYMPTOMS/MOOD NEUROLOGICAL BOWEL NUTRITION STATUS    Convulsions/Seizures Continent Diet (see discharge  summary)  AMBULATORY STATUS COMMUNICATION OF NEEDS Skin   Limited Assist Verbally Skin abrasions, Other (Comment) (ecchymosis)                       Personal Care Assistance Level of Assistance  Bathing, Feeding, Dressing Bathing Assistance: Limited assistance Feeding assistance: Limited assistance Dressing Assistance: Limited assistance     Functional Limitations Info  Sight, Hearing, Speech Sight Info: Adequate Hearing Info: Adequate Speech Info: Adequate    SPECIAL CARE FACTORS FREQUENCY  PT (By licensed PT), OT (By licensed OT)     PT Frequency: 5x week OT Frequency: 5x week            Contractures Contractures Info: Not present    Additional Factors Info  Code Status, Allergies Code Status Info: DNR Allergies Info: tape           Current Medications (08/03/2022):  This is the current hospital active medication list Current Facility-Administered Medications  Medication Dose Route Frequency Provider Last Rate Last Admin   acetaminophen (TYLENOL) tablet 650 mg  650 mg Oral Q6H PRN Fuller Plan A, MD   650 mg at 08/03/22 [id number]   Or   acetaminophen (TYLENOL) suppository 650 mg  650 mg Rectal Q6H PRN Fuller Plan A, MD       albuterol (PROVENTIL) (2.5 MG/3ML) 0.083% nebulizer solution 2.5 mg  2.5 mg Nebulization Q6H PRN Fuller Plan A, MD       divalproex (DEPAKOTE ER) 24 hr tablet 750 mg  750 mg Oral Daily Tamala Julian, Rondell A, MD   750 mg at 08/03/22  1107   furosemide (LASIX) tablet 40 mg  40 mg Oral Daily Charlynne Cousins, MD   40 mg at 08/03/22 0959   HYDROcodone-acetaminophen (NORCO/VICODIN) 5-325 MG per tablet 1 tablet  1 tablet Oral Q4H PRN Charlynne Cousins, MD   1 tablet at 08/03/22 1000   ondansetron (ZOFRAN) tablet 4 mg  4 mg Oral Q6H PRN Norval Morton, MD       Or   ondansetron (ZOFRAN) injection 4 mg  4 mg Intravenous Q6H PRN Smith, Rondell A, MD       polyethylene glycol (MIRALAX / GLYCOLAX) packet 17 g  17 g Oral BID Charlynne Cousins, MD   17 g at 08/03/22 0959   sodium chloride flush (NS) 0.9 % injection 3 mL  3 mL Intravenous Q12H Smith, Rondell A, MD   3 mL at 08/02/22 2118   tamsulosin (FLOMAX) capsule 0.4 mg  0.4 mg Oral Daily Fuller Plan A, MD   0.4 mg at 08/03/22 0959   topiramate (TOPAMAX) tablet 50 mg  50 mg Oral QHS Charlynne Cousins, MD       traMADol Veatrice Bourbon) tablet 50 mg  50 mg Oral Q6H PRN Charlynne Cousins, MD       traZODone (DESYREL) tablet 50 mg  50 mg Oral QHS Charlynne Cousins, MD         Discharge Medications: Please see discharge summary for a list of discharge medications.  Relevant Imaging Results:  Relevant Lab Results:   Additional Information SSN: [id number]. Pt is vaccinated for covid with boosters  [name], LCSW

## 2022-08-03 NOTE — Care Management Obs Status (Signed)
[facility] NOTIFICATION   Patient Details  Name: Kenneth Bowen MRN: [id number] Date of Birth: [birth year]   Medicare Observation Status Notification Given:  Yes    [name], LCSW 08/03/2022, 10:15 AM

## 2022-08-03 NOTE — Evaluation (Signed)
Physical Therapy Evaluation Patient Details Name: Kenneth Bowen MRN: [id number] DOB: 1931/05/09 Today's Date: 08/03/2022  History of Present Illness  Pt is 89 yo male whop resent to ER status post fall at home. Spouse states pt has fallen 3 times in the past couple of days. Lives in [place] with spouse. PMH: a fib, complete heart block with pacemaker, DVT on eliquis, chronic back pain, parkinsonism. Found chronic T12 horizontal vertebral fracture and mild paravertebral hematoma with edema at T12.  Clinical Impression  Pt requires increased time to perform all activities. Time was spent going over log roll technique today in order to decrease strain on the spine. OOB activity was limited due to pt has not yet received TLSO. Pt is presenting below baseline level of function requiring Mod A for bed mobility, Min A for sit to stand from elevated surface and Min A for taking a few steps at EOB. Prior to hospitalization pt reports that he was Mod I for ADL"s and was receiving HHPT from [facility]. Due to pt current functional status, available assistance at home and home set up recommending skilled physical therapy services at a higher level of care and frequency in order to decrease level of physical assistance which pt does not have at home currently in order to reduce risk for immobility, skin break down, falls, injury and re-hospitalization. Pt HR remained WNL throughout session with pt reporting no increase in pain when asked frequently throughout session.      Recommendations for follow up therapy are one component of a multi-disciplinary discharge planning process, led by the attending physician.  Recommendations may be updated based on patient status, additional functional criteria and insurance authorization.  Follow Up Recommendations Skilled nursing-short term rehab (<3 hours/day) Can patient physically be transported by private vehicle: No    Assistance Recommended at Discharge Frequent or constant  Supervision/Assistance  Patient can return home with the following  A lot of help with walking and/or transfers;Help with stairs or ramp for entrance;Assistance with cooking/housework;Assist for transportation    Equipment Recommendations None recommended by PT  Recommendations for Other Services       Functional Status Assessment Patient has had a recent decline in their functional status and demonstrates the ability to make significant improvements in function in a reasonable and predictable amount of time.     Precautions / Restrictions Precautions Precautions: Back Precaution Booklet Issued: No Precaution Comments: Back precautions for comfort Required Braces or Orthoses: Spinal Brace Spinal Brace: Thoracolumbosacral orthotic Spinal Brace Comments: Vertalign brace when OOB; pt has currently not received brace so limited OOB this session Restrictions Weight Bearing Restrictions: No      Mobility  Bed Mobility Overal bed mobility: Needs Assistance Bed Mobility: Rolling, Sidelying to Sit, Sit to Sidelying Rolling: Min assist Sidelying to sit: Mod assist     Sit to sidelying: Mod assist General bed mobility comments: Pt requires Mod A at the bil LE and trunk for side lying to sitting and bil LE/trunk for sitting to side lying. Pt requires min A for rolling to the L to perform log roll technique Patient Response: Cooperative  Transfers Overall transfer level: Needs assistance Equipment used: Rolling walker (2 wheels) Transfers: Sit to/from Stand Sit to Stand: Min assist, From elevated surface           General transfer comment: Pt was Min A from EOB elevated to height of pt bed at home with 1x verbal cues for standing to push up from bed and multiple  VC for reaching back to sit.    Ambulation/Gait Ambulation/Gait assistance: Min assist Gait Distance (Feet): 3 Feet Assistive device: Rolling walker (2 wheels) Gait Pattern/deviations: Step-to pattern, Decreased step  length - right, Decreased step length - left Gait velocity: Decreased cadence Gait velocity interpretation: <1.31 ft/sec, indicative of household ambulator   General Gait Details: Only took a couple of side steps and fwd back steps from EOB due to pt currently does not have TLSO with verbal cues for WB through the UE to off load the low back with partial step through shuffling steps both to the L and fwd/back. Min A for navigating AD  Stairs Stairs:  (not applicable)             Balance Overall balance assessment: Needs assistance Sitting-balance support: Bilateral upper extremity supported Sitting balance-Leahy Scale: Fair Sitting balance - Comments: no overt LOB sitting EOB; limited time sitting EOB to prevent stress on the spine   Standing balance support: Bilateral upper extremity supported, Reliant on assistive device for balance Standing balance-Leahy Scale: Fair Standing balance comment: No overt LOB, CGA for balance in standing, RW was placed with slight anterior tilt to help with posterior lean.           Pertinent Vitals/Pain Pain Assessment Pain Assessment: 0-10 Pain Score: 3  Pain Location: Across LB, No increase in pain throughout session; pt was asked frequently Pain Descriptors / Indicators: Aching Pain Intervention(s): Limited activity within patient's tolerance, Monitored during session    Home Living Family/patient expects to be discharged to:: Assisted living ([facility])       Home Equipment: Rollator (4 wheels);Other (comment);Shower seat;Grab bars - tub/shower;Grab bars - toilet Additional Comments: Lives with spouse who is also not in good health. Was receiving PT from [facility]    Prior Function Prior Level of Function : Independent/Modified Independent;History of Falls (last six months)   Mobility Comments: pt reports using a 3 wheeled rollator for mobility but has started using 4 wheel rollator. States he has "parkinson's features" with  no formal diagnosis of parkinson's. Pt reports a history of falling backwards ADLs Comments: Pt states that he was performing ADL"s on his own initially. At end of session mentioned a nursing company that he paid for a few hours in the morning and evening. Stated he had discussed increasing hours but has to pay for this and financially he is unsure if he can do that.     Hand Dominance   Dominant Hand: Right    Extremity/Trunk Assessment   Upper Extremity Assessment Upper Extremity Assessment: Defer to OT evaluation    Lower Extremity Assessment Lower Extremity Assessment: Generalized weakness    Cervical / Trunk Assessment Cervical / Trunk Assessment: Kyphotic  Communication   Communication: No difficulties  Cognition Arousal/Alertness: Awake/alert Behavior During Therapy: WFL for tasks assessed/performed Overall Cognitive Status: Difficult to assess         General Comments: Pt was not completely clear on assistance at home. He said that he did most activities at Mod I. Did mention nursing but would revert back to the PT he was getting from [facility] so hard to determine.        General Comments General comments (skin integrity, edema, etc.): Pt had no increase in pain throughout session. Requires assistance for all mobility.        Assessment/Plan    PT Assessment Patient needs continued PT services  PT Problem List Decreased strength;Decreased mobility;Decreased safety awareness;Decreased activity tolerance;Pain;Decreased balance  PT Treatment Interventions DME instruction;Therapeutic activities;Gait training;Therapeutic exercise;Stair training;Balance training;Functional mobility training;Neuromuscular re-education;Manual techniques;Wheelchair mobility training;Patient/family education    PT Goals (Current goals can be found in the Care Plan section)  Acute Rehab PT Goals Patient Stated Goal: To get stronger to be able to move better PT Goal Formulation: With  patient Time For Goal Achievement: 08/17/22 Potential to Achieve Goals: Fair    Frequency Min 2X/week        AM-PAC PT "6 Clicks" Mobility  Outcome Measure Help needed turning from your back to your side while in a flat bed without using bedrails?: A Little Help needed moving from lying on your back to sitting on the side of a flat bed without using bedrails?: A Lot Help needed moving to and from a bed to a chair (including a wheelchair)?: A Little Help needed standing up from a chair using your arms (e.g., wheelchair or bedside chair)?: A Little Help needed to walk in hospital room?: A Lot Help needed climbing 3-5 steps with a railing? : Total 6 Click Score: 14    End of Session Equipment Utilized During Treatment: Gait belt Activity Tolerance: Patient tolerated treatment well;No increased pain Patient left: in bed;with call bell/phone within reach;with bed alarm set Nurse Communication: Mobility status;Patient requests pain meds (pt had requested pain medication at the beginning of the session and RN was notified. He had no increase in pain throughout session. RN was notified he had wanted pain medication prior to the beginning of the session but pain did not increase.) PT Visit Diagnosis: Unsteadiness on feet (R26.81);Other abnormalities of gait and mobility (R26.89);Repeated falls (R29.6)    Time: [id number] PT Time Calculation (min) (ACUTE ONLY): 44 min   Charges:   PT Evaluation $PT Eval Low Complexity: 1 Low PT Treatments $Therapeutic Activity: 23-37 mins        Tomma Rakers, DPT, CLT  Acute Rehabilitation Services Office: [phone] (Secure chat preferred)   Ander Purpura 08/03/2022, 9:59 AM

## 2022-08-03 NOTE — Progress Notes (Addendum)
[facility] PROGRESS NOTE    Progress Note  Kenneth Bowen  [id number] DOB: May 28, 1930 DOA: 08/02/2022 PCP: Sueanne Margarita, DO     Brief Narrative:   Kenneth Bowen is an 89 y.o. male past medical history significant for atrial fibrillation, complete heart block status post pacemaker DVT on Eliquis, chronic back pain parkinsonism comes in after fall at home lives at with his wife in an independent living facility, the wife relates he is fallen 3 times over the last couple of days.  White count of 3.8 hemoglobin of 10.9 sodium 130 CT scan of the head cervical spine chest abdomen and pelvis showed no acute traumatic injury, but was found to have a T12 horizontal vertebral fracture which is chronic and was found to have a mild paravertebral hematoma with edema at T12, CT of the spine showed no epidural hematoma.  Neurosurgery was consulted recommended an MRI of the lumbar spine   Assessment/Plan:   Traumatic closed T12 fracture ([place]) secondary to fall: Neurosurgery was consulted recommended an MRI that showed a T12 vertebral body fracture superimposed on chronic with minimal bony retropulsion no extension into the spinal canal, no epidural hematoma, chronic L1-L2 and L5 vertebral fractures. Continue narcotics for analgesic control, started on bowel regimen. Awaiting neurosurgery with further recommendations. He is on multiple medications that can make him sleepy and fall like benzodiazepine trazodone and Topamax will hold benzodiazepines trazodone as needed at night.  Chronic pancytopenia ([place]) Basically unchanged from previous continue to monitor closely.  History of DVT: Continue Eliquis.  History of seizure disorder: Denies any seizures or any loss of control sphincter of biting his tongue continue Depakote.  Elevated LFTs: Likely due to traumatic fracture will monitor intermittently.  BPH: Continue Flomax.   DVT prophylaxis: Eliquis Family  Communication:Wife Status is: Observation The patient remains OBS appropriate and will d/c before 2 midnights.    Code Status:     Code Status Orders  (From admission, onward)           Start     Ordered   08/02/22 0916  Do not attempt resuscitation (DNR)  Continuous       Question Answer Comment  If patient has no pulse and is not breathing Do Not Attempt Resuscitation   If patient has a pulse and/or is breathing: Medical Treatment Goals MEDICAL INTERVENTIONS DESIRED: Use advanced airway interventions, mechanical ventilation or cardioversion in appropriate circumstances; Use medication/IV fluids as indicated; Provide comfort medications; Transfer to Progressive/Stepdown/ICU as indicated.   Consent: Discussion documented in EHR or advanced directives reviewed      08/02/22 0920           Code Status History     Date Active Date Inactive Code Status Order ID Comments User Context   06/07/2022 1607 06/09/2022 2014 DNR [id number]  Philis Pique, NP Inpatient   06/02/2022 1338 06/07/2022 1607 Full Code [id number]  Cristal Generous, NP ED   07/28/2016 2023 07/30/2016 1936 Full Code [id number]  Robbie Lis, MD Inpatient   07/12/2016 1748 07/13/2016 1859 Full Code [id number]  Arrien, Jimmy Picket, MD ED      Advance Directive Documentation    Flowsheet Row Most Recent Value  Type of Advance Directive Healthcare Power of Attorney, Living will  Pre-existing out of facility DNR order (yellow form or pink MOST form) --  "MOST" Form in Place? --         IV Access:   Peripheral IV   Procedures and  diagnostic studies:   MR LUMBAR SPINE WO CONTRAST  Result Date: 08/02/2022 CLINICAL DATA:  Back trauma, abnormal neuro exam. EXAM: MRI LUMBAR SPINE WITHOUT CONTRAST TECHNIQUE: Multiplanar, multisequence MR imaging of the lumbar spine was performed. No intravenous contrast was administered. COMPARISON:  Same-day CT chest/abdomen/pelvis, lumbar spine radiographs 07/21/2019, CT  abdomen/pelvis 04/02/2018 FINDINGS: Segmentation: Standard; the lowest formed disc space is designated L5-S1. Alignment: There is grade 1 retrolisthesis of L2 on L3 an S shaped curvature with dextrocurvature centered at the thoracolumbar junction and levocurvature centered at L4-L5. There is no evidence of traumatic malalignment. Vertebrae: There is compression deformity of the T12 vertebral body with significant loss of vertebral body height with a large fluid cleft consistent with acute distracted transverse fracture superimposed on pre-existing chronic compression fracture. There is minimal bony retropulsion without significant spinal canal stenosis. There is no extension into the posterior elements There is compression deformity of the L1 vertebral body with approximately 40% loss of vertebral body height anteriorly. There is marrow edema also consistent acute fracture superimposed on pre-existing compression fracture. There is no epidural hematoma. Additional compression deformities of the T11 and L2 through L5 vertebral bodies are also noted without marrow edema consistent with chronic fractures, new at L2 and progressed at T11 and L5 since 2019. There is no bony retropulsion at these levels. Background marrow signal is somewhat heterogeneous throughout but without focal or suspicious signal abnormality. Conus medullaris and cauda equina: Conus extends to the T12-L1 level. Conus and cauda equina appear normal. Paraspinal and other soft tissues: There is mild prevertebral edema at T12. The bladder is markedly distended as seen on prior CT. Disc levels: T12-L1: There is minimal bony retropulsion of the posterior T12 endplate but no significant spinal canal or neural foraminal stenosis. L1-L2: There is a minimal disc bulge and mild facet arthropathy with trace effusions but no significant spinal canal or neural foraminal stenosis. L2-L3: There is grade 1 retrolisthesis with a diffuse disc bulge eccentric to the  left and mild bilateral facet arthropathy with ligamentum flavum thickening and trace effusions resulting in left subarticular zone narrowing which may affect the traversing left L3 nerve root, and mild left and no significant right neural foraminal stenosis L3-L4: There is diffuse disc bulge eccentric to the left and advanced right and moderate left facet arthropathy with ligamentum flavum thickening resulting in moderate spinal canal stenosis with right worse than left subarticular zone narrowing and mild bilateral neural foraminal stenosis L4-L5: There is advanced right and moderate left facet arthropathy resulting in mild right and no significant left neural foraminal stenosis and no significant spinal canal stenosis L5-S1: Advanced bilateral facet arthropathy without significant spinal canal or neural foraminal stenosis. IMPRESSION: 1. Acute distracted transverse fracture of the T12 vertebral body superimposed on pre-existing chronic compression fracture with minimal bony retropulsion but no extension into the posterior elements or significant spinal canal stenosis. No epidural hematoma. 2. Additional acute on chronic fracture of the L1 vertebral body without bony retropulsion or extension into the posterior elements. 3. Chronic compression deformities of the L2 through L5 vertebral bodies without bony retropulsion. 4. Multilevel degenerative changes as detailed above with facet arthropathy most advanced at L3-L4 through L5-S1 resulting in up to moderate spinal canal stenosis at L3-L4. No high-grade neural foraminal stenosis. 5. Distended bladder as seen on prior CT. Electronically Signed   By: Valetta Mole M.D.   On: 08/02/2022 12:18   CT CHEST ABDOMEN PELVIS W CONTRAST  Addendum Date: 08/02/2022   ADDENDUM  REPORT: 08/02/2022 07:06 ADDENDUM: Study discussed by telephone with Dr. Quintella Reichert on 08/02/2022 at 0702 hours. Electronically Signed   By: Genevie Ann M.D.   On: 08/02/2022 07:06   Result Date:  08/02/2022 CLINICAL DATA:  89 year old male status post fall.  Found down. EXAM: CT CHEST, ABDOMEN, AND PELVIS WITH CONTRAST TECHNIQUE: Multidetector CT imaging of the chest, abdomen and pelvis was performed following the standard protocol during bolus administration of intravenous contrast. RADIATION DOSE REDUCTION: This exam was performed according to the departmental dose-optimization program which includes automated exposure control, adjustment of the mA and/or kV according to patient size and/or use of iterative reconstruction technique. CONTRAST:  55mL OMNIPAQUE IOHEXOL 350 MG/ML SOLN COMPARISON:  Cervical spine CT, trauma portable chest and pelvis radiographs today. Chest CT 01/02/2021.  CT Abdomen and Pelvis 04/02/2018. Lumbar radiographs 07/21/2019. FINDINGS: CT CHEST FINDINGS Cardiovascular: New left chest cardiac pacemaker device since 2022. Mildly increased cardiac size, borderline cardiomegaly. No pericardial effusion. Calcified coronary artery plaque. Comparatively mild thoracic aortic plaque. Stable mild tortuosity of the thoracic aorta. Mediastinum/Nodes: Negative. No mediastinal hematoma or lymphadenopathy. Lungs/Pleura: Small layering left pleural effusion with simple fluid density is new since 2022, favor transudate. Mildly lower lung volumes overall. Major airways remain patent. Chronic streaky left lower lobe, lingula lung opacity has not significantly changed when allowing for effusion and lower lung volumes and is likely chronic scarring. Similar platelike opacity in the medial segment of the right middle lobe today. Similar right lower lobe ground-glass and platelike opacity. No pneumothorax. No convincing pulmonary contusion. No convincing pneumonia. Musculoskeletal: Diffuse osteopenia. Widespread flowing thoracic endplate osteophytes result in multilevel thoracic interbody ankylosis. Chronic T2 inferior endplate compression fracture is stable. Chronic T11 mild compression fracture is  stable. Chronic severe T12 compression fracture with near vertebra plana. However, acute superimposed distracted horizontal type fracture through the compressed T12 vertebral body (series 7, image 68). The T12 posterior elements appear to remain intact. Superimposed chronic L1 superior endplate compression and the L1 posterior elements also appear intact. No spondylolisthesis at this level. Mild prevertebral hematoma or edema ventral to T12 but no CT evidence of spinal epidural hematoma. Visible shoulder osseous structures and sternum appear to remain intact. And no acute rib fracture is identified. CT ABDOMEN PELVIS FINDINGS Hepatobiliary: Liver and gallbladder appear intact. No perihepatic fluid. Pancreas: Partial fatty atrophy. Spleen: Diminutive, intact.  No perisplenic fluid. Adrenals/Urinary Tract: Stable and normal for age adrenal glands and kidneys. Symmetric renal enhancement and contrast excretion with decompressed ureters. Distended urinary bladder (890 mL), otherwise unremarkable. Stomach/Bowel: Redundant but mostly decompressed sigmoid colon in the pelvis and bilateral lower quadrants. Upstream retained stool from the right colon to the descending colon. No large bowel inflammation. Diminutive and normal appendix series 3, image 88. Decompressed terminal ileum and no dilated small bowel. Mostly decompressed stomach and duodenum. No free air or free fluid identified. Vascular/Lymphatic: Mild Aortoiliac calcified atherosclerosis. Major arterial structures in the abdomen and pelvis are patent. Portal venous system is patent on the delayed images. No lymphadenopathy identified. Reproductive: Chronic surgical clips adjacent to the prostate which appears somewhat indistinct but not significantly changed since 2019. Chronic right inguinal hernia repair with surgical clips is stable. Other: No convincing pelvis free fluid. Musculoskeletal: Osteopenia. Moderate chronic L1 compression fracture does not appear  significantly changed from 2019. Chronic L2 superior endplate compression fracture new since 2019 but present on the 2021 radiographs. Chronic L3 compression fracture stable since 2019. Chronic L4 compression fracture stable since 2019. Moderate to  severe L5 compression fracture is new since 2019 and appears progressed since 2021 although the inferior endplate was compressed at that time. Sacrum, SI joints, pelvis, and proximal femurs appear intact. IMPRESSION: 1. Acute distracted horizontal fracture through the T12 vertebral body which is chronically progressed (near vertebra plana). No posterior element discontinuity to indicate this is a bona fide Chance fracture, but this might be an unstable injury. No spondylolisthesis. Mild prevertebral hematoma or edema at T12 but no CT evidence of spinal epidural hematoma. Recommend Neurosurgery consultation. 2. Widespread other spinal, lumbar compression fractures. But none appear to be brand new when compared to 2021 exams. 2021 radiographs. 3. No other acute traumatic injury identified in the chest, abdomen, or pelvis. 4. Small layering left pleural effusion is new since 2022, favor transudate. Lower lung atelectasis and scarring with no convincing pneumonia. 5. Distended urinary bladder (890 mL).  Query urinary retention. 6.  Aortic Atherosclerosis (ICD10-I70.0). Electronically Signed: By: Genevie Ann M.D. On: 08/02/2022 06:59   CT Head Wo Contrast  Result Date: 08/02/2022 CLINICAL DATA:  89 year old male with history of head trauma from a fall. EXAM: CT HEAD WITHOUT CONTRAST CT CERVICAL SPINE WITHOUT CONTRAST TECHNIQUE: Multidetector CT imaging of the head and cervical spine was performed following the standard protocol without intravenous contrast. Multiplanar CT image reconstructions of the cervical spine were also generated. RADIATION DOSE REDUCTION: This exam was performed according to the departmental dose-optimization program which includes automated exposure  control, adjustment of the mA and/or kV according to patient size and/or use of iterative reconstruction technique. COMPARISON:  Head CT 06/02/2022. FINDINGS: CT HEAD FINDINGS Brain: Moderate cerebral and mild cerebellar atrophy. Patchy and confluent areas of decreased attenuation are noted throughout the deep and periventricular white matter of the cerebral hemispheres bilaterally, compatible with chronic microvascular ischemic disease. Several well-defined areas of low attenuation in the basal ganglia bilaterally, compatible with old lacunar infarcts. No evidence of acute infarction, hemorrhage, hydrocephalus, extra-axial collection or mass lesion/mass effect. Vascular: No hyperdense vessel or unexpected calcification. Skull: Normal. Negative for fracture or focal lesion. Sinuses/Orbits: No acute finding. Other: None. CT CERVICAL SPINE FINDINGS Alignment: Normal. Skull base and vertebrae: Chronic appearing compression fracture of C5 with 25% loss of anterior vertebral body height. No acute fracture. No primary bone lesion or focal pathologic process. Soft tissues and spinal canal: No prevertebral fluid or swelling. No visible canal hematoma. Disc levels: Severe multilevel degenerative disc disease, most pronounced at C4-C5, C5-C6 and C6-C7. Severe multilevel facet arthropathy bilaterally. Upper chest: Unremarkable. Other: None IMPRESSION: 1. No evidence of significant acute traumatic injury to the skull, brain or cervical spine. 2. 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 3. Severe multilevel degenerative disc disease and cervical spondylosis, as above. Electronically Signed   By: Vinnie Langton M.D.   On: 08/02/2022 06:40   CT Cervical Spine Wo Contrast  Result Date: 08/02/2022 CLINICAL  DATA:  89 year old male with history of head trauma from a fall. EXAM: CT HEAD WITHOUT CONTRAST CT CERVICAL SPINE WITHOUT CONTRAST TECHNIQUE: Multidetector CT imaging of the head and cervical spine was performed following the standard protocol without intravenous contrast. Multiplanar CT image reconstructions of the cervical spine were also generated. RADIATION DOSE REDUCTION: This exam was performed according to the departmental dose-optimization program which includes automated exposure control, adjustment of the mA and/or kV according to  patient size and/or use of iterative reconstruction technique. COMPARISON:  Head CT 06/02/2022. FINDINGS: CT HEAD FINDINGS Brain: Moderate cerebral and mild cerebellar atrophy. Patchy and confluent areas of decreased attenuation are noted throughout the deep and periventricular white matter of the cerebral hemispheres bilaterally, compatible with chronic microvascular ischemic disease. Several well-defined areas of low attenuation in the basal ganglia bilaterally, compatible with old lacunar infarcts. No evidence of acute infarction, hemorrhage, hydrocephalus, extra-axial collection or mass lesion/mass effect. Vascular: No hyperdense vessel or unexpected calcification. Skull: Normal. Negative for fracture or focal lesion. Sinuses/Orbits: No acute finding. Other: None. CT CERVICAL SPINE FINDINGS Alignment: Normal. Skull base and vertebrae: Chronic appearing compression fracture of C5 with 25% loss of anterior vertebral body height. No acute fracture. No primary bone lesion or focal pathologic process. Soft tissues and spinal canal: No prevertebral fluid or swelling. No visible canal hematoma. Disc levels: Severe multilevel degenerative disc disease, most pronounced at C4-C5, C5-C6 and C6-C7. Severe multilevel facet arthropathy bilaterally. Upper chest: Unremarkable. Other: None IMPRESSION: 1. No evidence of significant acute traumatic injury to the skull, brain or cervical spine.  2. 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 3. Severe multilevel degenerative disc disease and cervical spondylosis, as above. Electronically Signed   By: Vinnie Langton M.D.   On: 08/02/2022 06:40   DG Pelvis Portable  Result Date: 08/02/2022 CLINICAL DATA:  89 year old male status post fall. EXAM: PORTABLE PELVIS 1-2 VIEWS COMPARISON:  CT Abdomen and Pelvis 04/02/2018. FINDINGS: Portable AP supine view at 0447 hours. Chronic pelvic floor surgical clips redemonstrated and stable. Femoral heads remain normally located. Hip joint spaces appear stable. The pelvis is mildly rotated to the right. No pelvis fracture identified. SI joints and symphysis appear within normal limits. Grossly intact proximal femurs. Partially visible chronic lumbar compression fractures and dextroconvex scoliosis. Negative visible bowel gas pattern. IMPRESSION: No acute fracture or dislocation identified about the pelvis. Electronically Signed   By: Genevie Ann M.D.   On: 08/02/2022 05:00   DG Chest Port 1 View  Result Date: 08/02/2022 CLINICAL DATA:  89 year old male status post fall. EXAM: PORTABLE CHEST 1 VIEW COMPARISON:  Portable chest 06/05/2022 and earlier. FINDINGS: Portable AP upright view at 0445 hours. Lordotic positioning. Similar somewhat low lung volumes. Stable left chest dual lead cardiac pacemaker. Mediastinal contours are within normal limits. Visualized tracheal air column is within normal limits. Curvilinear chronic left lung base scarring, demonstrated by CT in 2022. No pneumothorax, pulmonary edema, pleural effusion or acute pulmonary opacity. Paucity of bowel gas in the visible abdomen. Stable visualized osseous structures. IMPRESSION: No acute cardiopulmonary abnormality. Electronically Signed    By: Genevie Ann M.D.   On: 08/02/2022 04:59     Medical Consultants:   None.   Subjective:    Kenneth Bowen relates he has pain with movement  Objective:    Vitals:   08/02/22 0950 08/02/22 1018 08/02/22 2027 08/03/22 0458  BP:  (!) 159/87 111/63 134/65  Pulse:  60    Resp:  17 15 16   Temp: 98.5 F (36.9 C)  98 F (36.7 C) 98.3 F (36.8 C)  TempSrc: Oral  Oral Oral  SpO2:  100% 100% 100%  Weight:      Height:       SpO2: 100 %   Intake/Output Summary (Last 24 hours) at 08/03/2022  [place] filed at 08/02/2022 2044 Gross per 24 hour  Intake 360 ml  Output 1790 ml  Net -1430 ml   Filed Weights   08/02/22 0421  Weight: 81.2 kg    Exam: General exam: In no acute distress. Respiratory system: Good air movement and clear to auscultation. Cardiovascular system: S1 & S2 heard, RRR. No JV. Gastrointestinal system: Abdomen is nondistended, soft and nontender.  Extremities: No pedal edema. Skin: Multiple bruises on chest and face Psychiatry: Judgement and insight appear normal. Mood & affect appropriate.    Data Reviewed:    Labs: Basic Metabolic Panel: Recent Labs  Lab 08/02/22 0446 08/02/22 0503 08/03/22 0248  NA 130* 133* 128*  K 4.2 4.0 3.8  CL 98 96* 98  CO2 25  --  24  GLUCOSE 104* 103* 92  BUN 13 15 12   CREATININE 0.58* 0.60* 0.55*  CALCIUM 8.9  --  8.5*   GFR Estimated Creatinine Clearance: 64.6 mL/min (A) (by C-G formula based on SCr of 0.55 mg/dL (L)). Liver Function Tests: Recent Labs  Lab 08/02/22 0446  AST 42*  ALT 18  ALKPHOS 80  BILITOT 0.9  PROT 6.1*  ALBUMIN 2.9*   No results for input(s): "LIPASE", "AMYLASE" in the last 168 hours. No results for input(s): "AMMONIA" in the last 168 hours. Coagulation profile No results for input(s): "INR", "PROTIME" in the last 168 hours. COVID-19 Labs  No results for input(s): "DDIMER", "FERRITIN", "LDH", "CRP" in the last 72 hours.  Lab Results  Component Value Date    [place] NEGATIVE 06/02/2022    CBC: Recent Labs  Lab 08/02/22 0446 08/02/22 0503 08/03/22 0248  WBC 3.8*  --  4.4  NEUTROABS 2.6  --   --   HGB 10.9* 11.6* 10.6*  HCT 32.7* 34.0* 30.5*  MCV 96.7  --  92.7  PLT 141*  --  150   Cardiac Enzymes: No results for input(s): "CKTOTAL", "CKMB", "CKMBINDEX", "TROPONINI" in the last 168 hours. BNP (last 3 results) No results for input(s): "PROBNP" in the last 8760 hours. CBG: No results for input(s): "GLUCAP" in the last 168 hours. D-Dimer: No results for input(s): "DDIMER" in the last 72 hours. Hgb A1c: No results for input(s): "HGBA1C" in the last 72 hours. Lipid Profile: No results for input(s): "CHOL", "HDL", "LDLCALC", "TRIG", "CHOLHDL", "LDLDIRECT" in the last 72 hours. Thyroid function studies: No results for input(s): "TSH", "T4TOTAL", "T3FREE", "THYROIDAB" in the last 72 hours.  Invalid input(s): "FREET3" Anemia work up: No results for input(s): "VITAMINB12", "FOLATE", "FERRITIN", "TIBC", "IRON", "RETICCTPCT" in the last 72 hours. Sepsis Labs: Recent Labs  Lab 08/02/22 0446 08/03/22 0248  WBC 3.8* 4.4   Microbiology No results found for this or any previous visit (from the past 240 hour(s)).   Medications:    divalproex  750 mg Oral Daily   sodium chloride flush  3 mL Intravenous Q12H   tamsulosin  0.4 mg Oral Daily   Continuous Infusions:    LOS: 0 days   Charlynne Cousins  [facility]  08/03/2022, 7:01 AM

## 2022-08-03 NOTE — TOC Initial Note (Addendum)
Transition of Care [facility]) - Initial/Assessment Note    Patient Details  Name: Kenneth Bowen MRN: [id number] Date of Birth: February 19, 1931  Transition of Care [facility]) CM/SW Contact:    [name], LCSW Phone Number: 08/03/2022, 11:33 AM  Clinical Narrative:    CSW met with pt regarding DC recommendation for SNF.  Pt is agreeable to this, has been to [facility] in past but wants to consider other options.  Medicare choice document given.  Permission given to send out referral in hub and to speak with wife and all three sons. Pt is from Independent living at [facility], stays with wife.  Reports he currently has PT services through [facility] and also some [place] aide services. Pt is vaccinated for covid with boosters.  CSW spoke with wife [name] and son Timmothy Sours, they are in agreement with plan for SNF as well.  Referral sent out in hub for SNF.      1430: Bed offers provided to pt.  He is asking for response from [facility].  CSW reached out to [facility].            Expected Discharge Plan: Skilled Nursing Facility Barriers to Discharge: Continued Medical Work up, SNF Pending bed offer   Patient Goals and CMS Choice Patient states their goals for this hospitalization and ongoing recovery are:: get back to where I was CMS Medicare.gov Compare Post Acute Care list provided to:: Patient Choice offered to / list presented to : Patient      Expected Discharge Plan and Services In-house Referral: Clinical Social Work   Post Acute Care Choice: [place] Living arrangements for the past 2 months: [place] [employer])                                      Prior Living Arrangements/Services Living arrangements for the past 2 months: [place] [employer]) Lives with:: Spouse Patient language and need for interpreter reviewed:: Yes Do you feel safe going back to the place where you live?: Yes      Need for Family  Participation in Patient Care: No (Comment) Care giver support system in place?: Yes (comment) Current home services: Homehealth aide, Home PT Criminal Activity/Legal Involvement Pertinent to Current Situation/Hospitalization: No - Comment as needed  Activities of Daily Living Home Assistive Devices/Equipment: Environmental consultant (specify type) (4 wheel walker) ADL Screening (condition at time of admission) Patient's cognitive ability adequate to safely complete daily activities?: No Is the patient deaf or have difficulty hearing?: Yes Does the patient have difficulty seeing, even when wearing glasses/contacts?: No Does the patient have difficulty concentrating, remembering, or making decisions?: Yes Patient able to express need for assistance with ADLs?: Yes Does the patient have difficulty dressing or bathing?: Yes Independently performs ADLs?: No Communication: Independent Dressing (OT): Needs assistance Grooming: Needs assistance Feeding: Independent Bathing: Needs assistance Toileting: Needs assistance In/Out Bed: Needs assistance Walks in Home: Needs assistance Does the patient have difficulty walking or climbing stairs?: Yes Weakness of Legs: Both Weakness of Arms/Hands: Both  Permission Sought/Granted Permission sought to share information with : Family Supports Permission granted to share information with : Yes, Verbal Permission Granted  Share Information with NAME: wife [name], sons: Cathlean Sauer  Permission granted to share info w AGENCY: SNF        Emotional Assessment Appearance:: Appears stated age Attitude/Demeanor/Rapport: Engaged Affect (typically observed): Appropriate, Pleasant  Orientation: : Oriented to Self, Oriented to Place, Oriented to  Time, Oriented to Situation      Admission diagnosis:  Falls [W19.XXXA] Fall, initial encounter [id number]] Closed fracture of twelfth thoracic vertebra, unspecified fracture morphology, initial encounter [facility])  [S22.089A] Patient Active Problem List   Diagnosis Date Noted   Falls 08/02/2022   History of DVT (deep vein thrombosis) 08/02/2022   Closed T12 fracture ([place]) 08/02/2022   Pancytopenia ([place]) 08/02/2022   Seizure ([place]) 08/02/2022   Elevated AST (SGOT) 08/02/2022   At risk for aspiration 06/15/2022   Palliative care encounter 06/15/2022   Acute respiratory failure with hypoxia ([place]) 06/05/2022   Physical deconditioning 06/05/2022   Heart block AV complete ([place]) 06/04/2022   Altered mental status 06/03/2022   Suspected stroke patient last known to be well 2 to 3 hours ago 06/02/2022   Therapeutic drug monitoring 06/10/2017   Partial symptomatic epilepsy with complex partial seizures, not intractable, without status epilepticus ([place]) 12/05/2016   Syncope 07/29/2016   BPH (benign prostatic hyperplasia) 07/28/2016   Dyslipidemia 07/28/2016   Anxiety    History of prostate cancer    TIA (transient ischemic attack) 07/12/2016   PCP:  Sueanne Margarita, DO Pharmacy:   [id number] - [place], [address] [place] [address] Phone: [phone] [phone]     Social Determinants of Health (SDOH) Social History: SDOH Screenings   Food Insecurity: No Food Insecurity (08/02/2022)  Housing: Low Risk  (08/02/2022)  Transportation Needs: No Transportation Needs (08/02/2022)  Utilities: Not At Risk (08/02/2022)  Tobacco Use: Low Risk  (08/02/2022)   SDOH Interventions:     Readmission Risk Interventions     No data to display

## 2022-08-03 NOTE — Evaluation (Signed)
Occupational Therapy Evaluation Patient Details Name: Kenneth Bowen MRN: [id number] DOB: 1931-03-15 Today's Date: 08/03/2022   History of Present Illness Pt is 89 yo male whop resent to ER status post fall at home. Spouse states pt has fallen 3 times in the past couple of days. Lives in [place] with spouse.   CT of the lumbar spine revealed acute T12 compression fracture and L1 compression fracture. PMH: a fib, complete heart block with pacemaker, DVT on eliquis, chronic back pain, parkinsonism. Found chronic T12 horizontal vertebral fracture and mild paravertebral hematoma with edema at T12.   Clinical Impression   PTA  pt lives with his wife at [facility] and has been having increased falls. His wife also has difficulty with mobility. Mobility limited as no back brace in room. Currently requires mod A with mobility and overall Max A with LB ADL. Pt demonstrates a significant functional decline and will benefit from skilled OT services to facilitate D/C to next venue due to below deficits.     Recommendations for follow up therapy are one component of a multi-disciplinary discharge planning process, led by the attending physician.  Recommendations may be updated based on patient status, additional functional criteria and insurance authorization.   Follow Up Recommendations  Skilled nursing-short term rehab (<3 hours/day)     Assistance Recommended at Discharge Frequent or constant Supervision/Assistance  Patient can return home with the following A lot of help with walking and/or transfers;A lot of help with bathing/dressing/bathroom;Assist for transportation;Help with stairs or ramp for entrance;Assistance with cooking/housework    Functional Status Assessment  Patient has had a recent decline in their functional status and demonstrates the ability to make significant improvements in function in a reasonable and predictable amount of time.  Equipment Recommendations  None  recommended by OT    Recommendations for Other Services       Precautions / Restrictions Precautions Precautions: Back Precaution Booklet Issued: No Precaution Comments: Back precautions for comfort Required Braces or Orthoses: Spinal Brace Spinal Brace: Thoracolumbosacral orthotic Spinal Brace Comments: on whenever out of bed Restrictions Weight Bearing Restrictions: No      Mobility Bed Mobility Overal bed mobility: Needs Assistance Bed Mobility: Rolling, Sidelying to Sit, Sit to Sidelying Rolling: Min assist Sidelying to sit: Mod assist     Sit to sidelying: Mod assist      Transfers Overall transfer level: Needs assistance Equipment used: Rolling walker (2 wheels) Transfers: Sit to/from Stand Sit to Stand: Min assist, From elevated surface           General transfer comment: mod VC for side stepping      Balance Overall balance assessment: History of Falls, Needs assistance   Sitting balance-Leahy Scale: Fair       Standing balance-Leahy Scale: Poor                             ADL either performed or assessed with clinical judgement   ADL Overall ADL's : Needs assistance/impaired Eating/Feeding: Set up   Grooming: Set up;Supervision/safety   Upper Body Bathing: Minimal assistance   Lower Body Bathing: Moderate assistance;Sit to/from stand   Upper Body Dressing : Minimal assistance   Lower Body Dressing: Maximal assistance;Sit to/from stand   Toilet Transfer: Moderate assistance;Rolling walker (2 wheels)   Toileting- Clothing Manipulation and Hygiene: Total assistance Toileting - Clothing Manipulation Details (indicate cue type and reason): foley; brief     Functional mobility during  ADLs: Moderate assistance;Rolling walker (2 wheels) General ADL Comments: began education regarding back precautions     Vision Baseline Vision/History: 1 Wears glasses       Perception     Praxis      Pertinent Vitals/Pain Pain  Assessment Pain Assessment: Faces Faces Pain Scale: Hurts even more Pain Location: low back with coughing Pain Descriptors / Indicators: Discomfort, Grimacing, Guarding Pain Intervention(s): Limited activity within patient's tolerance     Hand Dominance Right   Extremity/Trunk Assessment Upper Extremity Assessment Upper Extremity Assessment: Generalized weakness;LUE deficits/detail (continues to follow pacemaker precautions however had PM placed > 6 wks ago)   Lower Extremity Assessment Lower Extremity Assessment: Generalized weakness   Cervical / Trunk Assessment Cervical / Trunk Assessment: Kyphotic (posterioior bias)   Communication Communication Communication: No difficulties (some stuttering noted - most likely due to parkinson's)   Cognition Arousal/Alertness: Awake/alert Behavior During Therapy: WFL for tasks assessed/performed Overall Cognitive Status: No family/caregiver present to determine baseline cognitive functioning                                 General Comments: most likely baseline     General Comments  Pt had no increase in pain throughout session. Requires assistance for all mobility.    Exercises Exercises: Other exercises Other Exercises Other Exercises: encouraged general BU/LE AROM at bed level   Shoulder Instructions      Home Living Family/patient expects to be discharged to:: Assisted living                             Home Equipment: Rollator (4 wheels);Other (comment);Shower seat;Grab bars - tub/shower;Grab bars - toilet   Additional Comments: ILF at [facility]      Prior Functioning/Environment Prior Level of Function : Independent/Modified Independent;History of Falls (last six months)             Mobility Comments: pt reports using a 3 wheeled rollator for mobility but has started using 4 wheel rollator. States he has "parkinson's features" with no formal diagnosis of parkinson's. Pt reports a  history of falling backwards ADLs Comments: Pt states that he was performing ADL"s on his own initially. At end of session mentioned a nursing company that he paid for a few hours in the morning and evening. Stated he had discussed increasing hours but has to pay for this and financially he is unsure if he can do that.        OT Problem List: Decreased strength;Decreased range of motion;Decreased activity tolerance;Impaired balance (sitting and/or standing);Decreased safety awareness;Decreased knowledge of use of DME or AE;Decreased knowledge of precautions;Impaired UE functional use;Pain;Increased edema      OT Treatment/Interventions: Self-care/ADL training;Therapeutic exercise;DME and/or AE instruction;Therapeutic activities;Patient/family education;Balance training    OT Goals(Current goals can be found in the care plan section) Acute Rehab OT Goals Patient Stated Goal: to go to rehab OT Goal Formulation: With patient Time For Goal Achievement: 08/17/22 Potential to Achieve Goals: Good  OT Frequency: Min 2X/week    Co-evaluation              AM-PAC OT "6 Clicks" Daily Activity     Outcome Measure Help from another person eating meals?: A Little Help from another person taking care of personal grooming?: A Little Help from another person toileting, which includes using toliet, bedpan, or urinal?: Total Help from another person bathing (  including washing, rinsing, drying)?: A Lot Help from another person to put on and taking off regular upper body clothing?: A Little Help from another person to put on and taking off regular lower body clothing?: A Lot 6 Click Score: 14   End of Session Equipment Utilized During Treatment: Gait belt;Rolling walker (2 wheels) Nurse Communication: Other (comment);Mobility status;Precautions (L forearm swollen around coban site; needs ot be addressed)  Activity Tolerance: Patient tolerated treatment well Patient left: in bed;with call bell/phone  within reach;with bed alarm set (wiht siderail up per his request)  OT Visit Diagnosis: Unsteadiness on feet (R26.81);Other abnormalities of gait and mobility (R26.89);Repeated falls (R29.6);Muscle weakness (generalized) (M62.81);History of falling (Z91.81);Pain Pain - part of body:  (back)                Time: [id number] OT Time Calculation (min): 28 min Charges:  OT General Charges $OT Visit: 1 Visit OT Evaluation $OT Eval Moderate Complexity: 1 Mod OT Treatments $Self Care/Home Management : 8-22 mins  Maurie Boettcher, OT/L   Acute OT Clinical Specialist Acute Rehabilitation Services Pager [phone] Office [phone]   [facility] 08/03/2022, 1:19 PM

## 2022-08-04 DIAGNOSIS — S22089A Unspecified fracture of T11-T12 vertebra, initial encounter for closed fracture: Secondary | ICD-10-CM | POA: Diagnosis not present

## 2022-08-04 DIAGNOSIS — W19XXXA Unspecified fall, initial encounter: Secondary | ICD-10-CM | POA: Diagnosis not present

## 2022-08-04 DIAGNOSIS — D61818 Other pancytopenia: Secondary | ICD-10-CM | POA: Diagnosis not present

## 2022-08-04 MED ORDER — APIXABAN 2.5 MG PO TABS
2.5000 mg | ORAL_TABLET | Freq: Two times a day (BID) | ORAL | Status: DC
  Administered 2022-08-04 – 2022-08-07 (×7): 2.5 mg via ORAL
  Filled 2022-08-04 (×8): qty 1

## 2022-08-04 MED ORDER — ALPRAZOLAM 0.5 MG PO TABS: 0.5000 mg | ORAL_TABLET | Freq: Every day | ORAL | Status: DC | PRN

## 2022-08-04 NOTE — TOC Progression Note (Addendum)
Transition of Care [facility]) - Progression Note    Patient Details  Name: Kenneth Bowen MRN: [id number] Date of Birth: [birth year]  [facility]) CM/SW Contact  Delray Reza [place], [place] Phone Number: 08/04/2022, 12:04 PM  Clinical Narrative:    Bed offers reviewed with patient at bedside, spoke with spouse by phone regarding. Patient's spouse will discuss offers with patient and contact this social worker back this afternoon.  4:08pm Phone call to spouse to discuss SNF choice, patient continues to consider options.  Veria Stradley, LCSW Transition of Care     Expected Discharge Plan: Skilled Nursing Facility Barriers to Discharge: Continued Medical Work up, SNF Pending bed offer  Expected Discharge Plan and Services In-house Referral: Clinical Social Work   Post Acute Care Choice: [place] Living arrangements for the past 2 months: [place]                                       Social Determinants of Health (SDOH) Interventions SDOH Screenings   Food Insecurity: No Food Insecurity (08/02/2022)  Housing: Low Risk  (08/02/2022)  Transportation Needs: No Transportation Needs (08/02/2022)  Utilities: Not At Risk (08/02/2022)  Tobacco Use: Low Risk  (08/02/2022)    Readmission Risk Interventions     No data to display

## 2022-08-04 NOTE — Progress Notes (Signed)
TRH night cross cover note:   I was notified by RN that while hospitalist documentation includes plan to continue home Eliquis, that there is not currently an order in place for his home Eliquis.    Consistent with the above conveyed plan, I subsequently placed order to resume home Eliquis, with first dose to occur at 10 AM this morning.     Babs Bertin, DO Hospitalist

## 2022-08-04 NOTE — Progress Notes (Signed)
[facility] PROGRESS NOTE    Progress Note  BRANIGAN SCHOLES  [id number] DOB: 1930/12/30 DOA: 08/02/2022 PCP: Sueanne Margarita, DO     Brief Narrative:   Kenneth Bowen is an 89 y.o. male past medical history significant for atrial fibrillation, complete heart block status post pacemaker DVT on Eliquis, chronic back pain parkinsonism comes in after fall at home lives at with his wife in an independent living facility, the wife relates he is fallen 3 times over the last couple of days.  White count of 3.8 hemoglobin of 10.9 sodium 130 CT scan of the head cervical spine chest abdomen and pelvis showed no acute traumatic injury, but was found to have a T12 horizontal vertebral fracture which is chronic and was found to have a mild paravertebral hematoma with edema at T12, CT of the spine showed no epidural hematoma.  Neurosurgery was consulted recommended an MRI of the lumbar spine   Assessment/Plan:   Traumatic closed T12 fracture ([place]) secondary to fall: Continue narcotics for analgesic control, started on bowel regimen. Neurosurgery recommended TLSO when out of bed. PT OT consulted will need skilled nursing facility placement. He is on multiple medications that can make him sleepy, hold benzodiazepines trazodone as needed at night.  Continue Topamax. Awaiting skilled nursing facility placement.  Chronic pancytopenia ([place]) Basically unchanged from previous continue to monitor closely.  History of DVT: Continue Eliquis.  History of seizure disorder: Denies any seizures or any loss of control sphincter of biting his tongue continue Depakote.  Elevated LFTs: Likely due to traumatic fracture will monitor intermittently.  BPH: Continue Flomax.   DVT prophylaxis: Eliquis Family Communication:Wife Status is: Observation The patient remains OBS appropriate and will d/c before 2 midnights.    Code Status:     Code Status Orders  (From admission, onward)            Start     Ordered   08/02/22 0916  Do not attempt resuscitation (DNR)  Continuous       Question Answer Comment  If patient has no pulse and is not breathing Do Not Attempt Resuscitation   If patient has a pulse and/or is breathing: Medical Treatment Goals MEDICAL INTERVENTIONS DESIRED: Use advanced airway interventions, mechanical ventilation or cardioversion in appropriate circumstances; Use medication/IV fluids as indicated; Provide comfort medications; Transfer to Progressive/Stepdown/ICU as indicated.   Consent: Discussion documented in EHR or advanced directives reviewed      08/02/22 0920           Code Status History     Date Active Date Inactive Code Status Order ID Comments User Context   06/07/2022 1607 06/09/2022 2014 DNR [id number]  Philis Pique, NP Inpatient   06/02/2022 1338 06/07/2022 1607 Full Code [id number]  Cristal Generous, NP ED   07/28/2016 2023 07/30/2016 1936 Full Code [id number]  Robbie Lis, MD Inpatient   07/12/2016 1748 07/13/2016 1859 Full Code [id number]  Arrien, Jimmy Picket, MD ED      Advance Directive Documentation    Flowsheet Row Most Recent Value  Type of Advance Directive Healthcare Power of [place], Living will  Pre-existing out of facility DNR order (yellow form or pink MOST form) --  "MOST" Form in Place? --         IV Access:   Peripheral IV   Procedures and diagnostic studies:   MR LUMBAR SPINE WO CONTRAST  Result Date: 08/02/2022 CLINICAL DATA:  Back trauma, abnormal neuro exam. EXAM: MRI LUMBAR  SPINE WITHOUT CONTRAST TECHNIQUE: Multiplanar, multisequence MR imaging of the lumbar spine was performed. No intravenous contrast was administered. COMPARISON:  Same-day CT chest/abdomen/pelvis, lumbar spine radiographs 07/21/2019, CT abdomen/pelvis 04/02/2018 FINDINGS: Segmentation: Standard; the lowest formed disc space is designated L5-S1. Alignment: There is grade 1 retrolisthesis of L2 on L3 an S shaped curvature with  dextrocurvature centered at the thoracolumbar junction and levocurvature centered at L4-L5. There is no evidence of traumatic malalignment. Vertebrae: There is compression deformity of the T12 vertebral body with significant loss of vertebral body height with a large fluid cleft consistent with acute distracted transverse fracture superimposed on pre-existing chronic compression fracture. There is minimal bony retropulsion without significant spinal canal stenosis. There is no extension into the posterior elements There is compression deformity of the L1 vertebral body with approximately 40% loss of vertebral body height anteriorly. There is marrow edema also consistent acute fracture superimposed on pre-existing compression fracture. There is no epidural hematoma. Additional compression deformities of the T11 and L2 through L5 vertebral bodies are also noted without marrow edema consistent with chronic fractures, new at L2 and progressed at T11 and L5 since 2019. There is no bony retropulsion at these levels. Background marrow signal is somewhat heterogeneous throughout but without focal or suspicious signal abnormality. Conus medullaris and cauda equina: Conus extends to the T12-L1 level. Conus and cauda equina appear normal. Paraspinal and other soft tissues: There is mild prevertebral edema at T12. The bladder is markedly distended as seen on prior CT. Disc levels: T12-L1: There is minimal bony retropulsion of the posterior T12 endplate but no significant spinal canal or neural foraminal stenosis. L1-L2: There is a minimal disc bulge and mild facet arthropathy with trace effusions but no significant spinal canal or neural foraminal stenosis. L2-L3: There is grade 1 retrolisthesis with a diffuse disc bulge eccentric to the left and mild bilateral facet arthropathy with ligamentum flavum thickening and trace effusions resulting in left subarticular zone narrowing which may affect the traversing left L3 nerve root,  and mild left and no significant right neural foraminal stenosis L3-L4: There is diffuse disc bulge eccentric to the left and advanced right and moderate left facet arthropathy with ligamentum flavum thickening resulting in moderate spinal canal stenosis with right worse than left subarticular zone narrowing and mild bilateral neural foraminal stenosis L4-L5: There is advanced right and moderate left facet arthropathy resulting in mild right and no significant left neural foraminal stenosis and no significant spinal canal stenosis L5-S1: Advanced bilateral facet arthropathy without significant spinal canal or neural foraminal stenosis. IMPRESSION: 1. Acute distracted transverse fracture of the T12 vertebral body superimposed on pre-existing chronic compression fracture with minimal bony retropulsion but no extension into the posterior elements or significant spinal canal stenosis. No epidural hematoma. 2. Additional acute on chronic fracture of the L1 vertebral body without bony retropulsion or extension into the posterior elements. 3. Chronic compression deformities of the L2 through L5 vertebral bodies without bony retropulsion. 4. Multilevel degenerative changes as detailed above with facet arthropathy most advanced at L3-L4 through L5-S1 resulting in up to moderate spinal canal stenosis at L3-L4. No high-grade neural foraminal stenosis. 5. Distended bladder as seen on prior CT. Electronically Signed   By: Valetta Mole M.D.   On: 08/02/2022 12:18     Medical Consultants:   None.   Subjective:    KATIE SCHOWALTER had a bowel movement yesterday  Objective:    Vitals:   08/03/22 0700 08/03/22 1408 08/03/22 2100 08/04/22 0547  BP:  116/72 134/65 104/64  Pulse: 72 64 63 (!) 59  Resp:  17 15 19   Temp: 98.3 F (36.8 C) 98.5 F (36.9 C) 98.2 F (36.8 C) 98.7 F (37.1 C)  TempSrc: Oral Oral Oral Oral  SpO2: 100% 98% 96% 91%  Weight:      Height:       SpO2: 91 %   Intake/Output Summary  (Last 24 hours) at 08/04/2022 0824 Last data filed at 08/04/2022 0548 Gross per 24 hour  Intake 360 ml  Output 2370 ml  Net -2010 ml    Filed Weights   08/02/22 0421  Weight: 81.2 kg    Exam: General exam: In no acute distress. Respiratory system: Good air movement and clear to auscultation. Cardiovascular system: S1 & S2 heard, RRR. No JV. Gastrointestinal system: Abdomen is nondistended, soft and nontender.  Extremities: No pedal edema. Skin: Multiple bruises on chest and face Psychiatry: Judgement and insight appear normal. Mood & affect appropriate.    Data Reviewed:    Labs: Basic Metabolic Panel: Recent Labs  Lab 08/02/22 0446 08/02/22 0503 08/03/22 0248  NA 130* 133* 128*  K 4.2 4.0 3.8  CL 98 96* 98  CO2 25  --  24  GLUCOSE 104* 103* 92  BUN 13 15 12   CREATININE 0.58* 0.60* 0.55*  CALCIUM 8.9  --  8.5*    GFR Estimated Creatinine Clearance: 64.6 mL/min (A) (by C-G formula based on SCr of 0.55 mg/dL (L)). Liver Function Tests: Recent Labs  Lab 08/02/22 0446  AST 42*  ALT 18  ALKPHOS 80  BILITOT 0.9  PROT 6.1*  ALBUMIN 2.9*    No results for input(s): "LIPASE", "AMYLASE" in the last 168 hours. No results for input(s): "AMMONIA" in the last 168 hours. Coagulation profile No results for input(s): "INR", "PROTIME" in the last 168 hours. COVID-19 Labs  No results for input(s): "DDIMER", "FERRITIN", "LDH", "CRP" in the last 72 hours.  Lab Results  Component Value Date   [place] NEGATIVE 06/02/2022    CBC: Recent Labs  Lab 08/02/22 0446 08/02/22 0503 08/03/22 0248  WBC 3.8*  --  4.4  NEUTROABS 2.6  --   --   HGB 10.9* 11.6* 10.6*  HCT 32.7* 34.0* 30.5*  MCV 96.7  --  92.7  PLT 141*  --  150    Cardiac Enzymes: No results for input(s): "CKTOTAL", "CKMB", "CKMBINDEX", "TROPONINI" in the last 168 hours. BNP (last 3 results) No results for input(s): "PROBNP" in the last 8760 hours. CBG: No results for input(s): "GLUCAP" in the  last 168 hours. D-Dimer: No results for input(s): "DDIMER" in the last 72 hours. Hgb A1c: No results for input(s): "HGBA1C" in the last 72 hours. Lipid Profile: No results for input(s): "CHOL", "HDL", "LDLCALC", "TRIG", "CHOLHDL", "LDLDIRECT" in the last 72 hours. Thyroid function studies: No results for input(s): "TSH", "T4TOTAL", "T3FREE", "THYROIDAB" in the last 72 hours.  Invalid input(s): "FREET3" Anemia work up: No results for input(s): "VITAMINB12", "FOLATE", "FERRITIN", "TIBC", "IRON", "RETICCTPCT" in the last 72 hours. Sepsis Labs: Recent Labs  Lab 08/02/22 0446 08/03/22 0248  WBC 3.8* 4.4    Microbiology No results found for this or any previous visit (from the past 240 hour(s)).   Medications:    apixaban  2.5 mg Oral BID   divalproex  750 mg Oral Daily   furosemide  40 mg Oral Daily   polyethylene glycol  17 g Oral BID   sodium chloride flush  3 mL  Intravenous Q12H   tamsulosin  0.4 mg Oral Daily   topiramate  50 mg Oral QHS   traZODone  50 mg Oral QHS   Continuous Infusions:    LOS: 0 days   Charlynne Cousins  [facility]  08/04/2022, 8:24 AM

## 2022-08-04 NOTE — Progress Notes (Signed)
Physical Therapy Treatment Patient Details Name: Kenneth Bowen MRN: [id number] DOB: 09-17-1930 Today's Date: 08/04/2022   History of Present Illness Pt is 89 yo male whop resent to ER status post fall at home. Spouse states pt has fallen 3 times in the past couple of days. Lives in [place] with spouse.   CT of the lumbar spine revealed acute T12 compression fracture and L1 compression fracture. PMH: a fib, complete heart block with pacemaker, DVT on eliquis, chronic back pain, parkinsonism. Found chronic T12 horizontal vertebral fracture and mild paravertebral hematoma with edema at T12.    PT Comments    Pt received in bed, agreeable to mobilizing. RN brought recliner to room as there was no seat for pt in room. Pt needed mod A to come to EOB due to decreased ability to bring LE's off bed and balance while elevating trunk into sitting. Pt needs max A to don TLSO. Mod A needed for sit>stand from elevated surface. Pt ambulated 8' fwd and 8' bkwd with RW and min A, very fatigued at end of 16'. Pt pivoted to recliner after a rest break with min A and RW. PT will continue to follow.    Recommendations for follow up therapy are one component of a multi-disciplinary discharge planning process, led by the attending physician.  Recommendations may be updated based on patient status, additional functional criteria and insurance authorization.  Follow Up Recommendations  Skilled nursing-short term rehab (<3 hours/day) Can patient physically be transported by private vehicle: No   Assistance Recommended at Discharge Frequent or constant Supervision/Assistance  Patient can return home with the following A lot of help with walking and/or transfers;Help with stairs or ramp for entrance;Assistance with cooking/housework;Assist for transportation   Equipment Recommendations  None recommended by PT    Recommendations for Other Services OT consult     Precautions / Restrictions Precautions Precautions:  Back Precaution Booklet Issued: No Precaution Comments: Back precautions for comfort Required Braces or Orthoses: Spinal Brace Spinal Brace: Thoracolumbosacral orthotic Spinal Brace Comments: on whenever out of bed Restrictions Weight Bearing Restrictions: No     Mobility  Bed Mobility Overal bed mobility: Needs Assistance Bed Mobility: Rolling, Sidelying to Sit Rolling: Min assist Sidelying to sit: Mod assist       General bed mobility comments: Pt requires Mod A at the bil LE and trunk for SL to sitting. vc's for log rolling    Transfers Overall transfer level: Needs assistance Equipment used: Rolling walker (2 wheels) Transfers: Sit to/from Stand, Bed to chair/wheelchair/BSC Sit to Stand: Min assist, From elevated surface, Mod assist   Step pivot transfers: Min assist       General transfer comment: mod A for first sit>stand, min A with subsequent trials. Needs assist to nring wt fwd    Ambulation/Gait Ambulation/Gait assistance: Min assist Gait Distance (Feet): 16 Feet (8' fwd, 8' bkwd) Assistive device: Rolling walker (2 wheels) Gait Pattern/deviations: Step-to pattern, Decreased step length - right, Decreased step length - left, [place] Gait velocity: decreased Gait velocity interpretation: <1.31 ft/sec, indicative of household ambulator   General Gait Details: pt ambulated further than he thought he could. LE's very fatigued at end of ambulation with shaking and extra support needed   Stairs             Wheelchair Mobility    Modified Rankin (Stroke Patients Only)       Balance Overall balance assessment: History of Falls, Needs assistance Sitting-balance support: Bilateral upper extremity supported Sitting  balance-Leahy Scale: Fair Sitting balance - Comments: occasional posterior lean Postural control: Posterior lean Standing balance support: Bilateral upper extremity supported, Reliant on assistive device for balance Standing  balance-Leahy Scale: Poor                              Cognition Arousal/Alertness: Awake/alert Behavior During Therapy: WFL for tasks assessed/performed Overall Cognitive Status: No family/caregiver present to determine baseline cognitive functioning                                 General Comments: slow processing (Parkinsons) most likely baseline        Exercises Other Exercises Other Exercises: encouraged general LE ROM in chair: APS's, heel and toe lifts, marching    General Comments General comments (skin integrity, edema, etc.): pt needs max A to don TLSO. Could not tolerate while sitting in chair due to front portion cutting into his throat when sitting      Pertinent Vitals/Pain Pain Assessment Pain Assessment: Faces Faces Pain Scale: Hurts little more Pain Location: low back Pain Descriptors / Indicators: Discomfort, Grimacing, Guarding Pain Intervention(s): Limited activity within patient's tolerance, Monitored during session    Home Living                          Prior Function            PT Goals (current goals can now be found in the care plan section) Acute Rehab PT Goals Patient Stated Goal: To get stronger to be able to move better PT Goal Formulation: With patient Time For Goal Achievement: 08/17/22 Potential to Achieve Goals: Fair Progress towards PT goals: Progressing toward goals    Frequency    Min 2X/week      PT Plan Current plan remains appropriate    Co-evaluation              AM-PAC PT "6 Clicks" Mobility   Outcome Measure  Help needed turning from your back to your side while in a flat bed without using bedrails?: A Little Help needed moving from lying on your back to sitting on the side of a flat bed without using bedrails?: A Lot Help needed moving to and from a bed to a chair (including a wheelchair)?: A Little Help needed standing up from a chair using your arms (e.g., wheelchair  or bedside chair)?: A Little Help needed to walk in hospital room?: A Lot Help needed climbing 3-5 steps with a railing? : Total 6 Click Score: 14    End of Session Equipment Utilized During Treatment: Gait belt;Back brace Activity Tolerance: Patient tolerated treatment well Patient left: with call bell/phone within reach;in chair;with chair alarm set Nurse Communication: Mobility status PT Visit Diagnosis: Unsteadiness on feet (R26.81);Other abnormalities of gait and mobility (R26.89);Repeated falls (R29.6)     Time: [id number] PT Time Calculation (min) (ACUTE ONLY): 29 min  Charges:  $Gait Training: 8-22 mins $Therapeutic Activity: 8-22 mins                     Leighton Roach, PT  Acute Rehab Services Secure chat preferred Office [place] 08/04/2022, 5:03 PM

## 2022-08-04 NOTE — TOC CAGE-AID Note (Signed)
Transition of Care [facility]) - CAGE-AID Screening   Patient Details  Name: Kenneth Bowen MRN: [id number] Date of Birth: 1930-08-27  Dia Crawford, RN Phone Number: 08/04/2022, 4:52 AM   Clinical Narrative: Pt denies tobacco/drug usage. Pt reports he will occasionally have 4oz of red wine but not recently. No resources needed.    CAGE-AID Screening:    Have You Ever Felt You Ought to Cut Down on Your Drinking or Drug Use?: No Have People Annoyed You By Critizing Your Drinking Or Drug Use?: No Have You Felt Bad Or Guilty About Your Drinking Or Drug Use?: No Have You Ever Had a Drink or Used Drugs First Thing In The Morning to Steady Your Nerves or to Get Rid of a Hangover?: No CAGE-AID Score: 0  Substance Abuse Education Offered: Yes  Substance abuse interventions:  (Pt denclines need for resources)

## 2022-08-04 NOTE — Progress Notes (Addendum)
Pt upset this morning. Requesting for XANAX to be added to his routine night meds, he verbalized that he did not sleep at all last night and he feels anxious this morning. Pt educated on why alprazolam was held by MD. Will notify MD

## 2022-08-05 DIAGNOSIS — S22089A Unspecified fracture of T11-T12 vertebra, initial encounter for closed fracture: Secondary | ICD-10-CM | POA: Diagnosis not present

## 2022-08-05 DIAGNOSIS — W19XXXA Unspecified fall, initial encounter: Secondary | ICD-10-CM | POA: Diagnosis not present

## 2022-08-05 DIAGNOSIS — D61818 Other pancytopenia: Secondary | ICD-10-CM | POA: Diagnosis not present

## 2022-08-05 NOTE — Progress Notes (Signed)
[facility] PROGRESS NOTE    Progress Note  Kenneth Bowen  [id number] DOB: [birth year] DOA: 08/02/2022 PCP: Sueanne Margarita, DO     Brief Narrative:   Kenneth Bowen is an 89 y.o. male past medical history significant for atrial fibrillation, complete heart block status post pacemaker DVT on Eliquis, chronic back pain parkinsonism comes in after fall at home lives at with his wife in an independent living facility, the wife relates he is fallen 3 times over the last couple of days.  White count of 3.8 hemoglobin of 10.9 sodium 130 CT scan of the head cervical spine chest abdomen and pelvis showed no acute traumatic injury, but was found to have a T12 horizontal vertebral fracture which is chronic and was found to have a mild paravertebral hematoma with edema at T12, CT of the spine showed no epidural hematoma.  Neurosurgery was consulted recommended an MRI of the lumbar spine   Assessment/Plan:   Traumatic closed T12 fracture ([place]) secondary to fall: Continue narcotics and bowel regimen. Appreciate neurosurgery's assistance continue TLSO when out of bed. PT OT consulted will need skilled nursing facility placement. He is on multiple medications that can make him sleepy, hold benzodiazepines trazodone as needed at night.  Continue Topamax. Awaiting skilled nursing facility placement.  Chronic pancytopenia ([place]) Basically unchanged from previous continue to monitor closely.  History of DVT: Continue Eliquis.  History of seizure disorder: Denies any seizures or any loss of control sphincter of biting his tongue continue Depakote.  Elevated LFTs: Likely due to traumatic fracture will monitor intermittently.  BPH: Continue Flomax.   DVT prophylaxis: Eliquis Family Communication:Wife Status is: Observation The patient remains OBS appropriate and will d/c before 2 midnights.    Code Status:     Code Status Orders  (From admission, onward)           Start      Ordered   08/02/22 0916  Do not attempt resuscitation (DNR)  Continuous       Question Answer Comment  If patient has no pulse and is not breathing Do Not Attempt Resuscitation   If patient has a pulse and/or is breathing: Medical Treatment Goals MEDICAL INTERVENTIONS DESIRED: Use advanced airway interventions, mechanical ventilation or cardioversion in appropriate circumstances; Use medication/IV fluids as indicated; Provide comfort medications; Transfer to Progressive/Stepdown/ICU as indicated.   Consent: Discussion documented in EHR or advanced directives reviewed      08/02/22 0920           Code Status History     Date Active Date Inactive Code Status Order ID Comments User Context   06/07/2022 1607 06/09/2022 2014 DNR [id number]  Philis Pique, NP Inpatient   06/02/2022 1338 06/07/2022 1607 Full Code [id number]  Cristal Generous, NP ED   07/28/2016 2023 07/30/2016 1936 Full Code [id number]  Robbie Lis, MD Inpatient   07/12/2016 1748 07/13/2016 1859 Full Code [id number]  Arrien, Jimmy Picket, MD ED      Advance Directive Documentation    Flowsheet Row Most Recent Value  Type of Advance Directive Healthcare Power of Attorney, Living will  Pre-existing out of facility DNR order (yellow form or pink MOST form) --  "MOST" Form in Place? --         IV Access:   Peripheral IV   Procedures and diagnostic studies:   No results found.   Medical Consultants:   None.   Subjective:    Kenneth Bowen no complaints today  pain seems to be well-controlled.  Objective:    Vitals:   08/04/22 2022 08/04/22 2353 08/05/22 0416 08/05/22 0745  BP: (!) 123/56 103/62 112/65 127/67  Pulse: 64 60 60 63  Resp: 20 16 16 18   Temp: 98.3 F (36.8 C) 97.8 F (36.6 C) 98 F (36.7 C)   TempSrc: Oral Oral Oral   SpO2: 98% 97% 95% 94%  Weight:      Height:       SpO2: 94 %   Intake/Output Summary (Last 24 hours) at 08/05/2022 0912 Last data filed at 08/05/2022  0911 Gross per 24 hour  Intake 360 ml  Output 1850 ml  Net -1490 ml    Filed Weights   08/02/22 0421  Weight: 81.2 kg    Exam: General exam: In no acute distress. Respiratory system: Good air movement and clear to auscultation. Cardiovascular system: S1 & S2 heard, RRR. No JVD. Gastrointestinal system: Abdomen is nondistended, soft and nontender.  Extremities: No pedal edema. Skin: No rashes, lesions or ulcers Psychiatry: Judgement and insight appear normal. Mood & affect appropriate.   Data Reviewed:    Labs: Basic Metabolic Panel: Recent Labs  Lab 08/02/22 0446 08/02/22 0503 08/03/22 0248  NA 130* 133* 128*  K 4.2 4.0 3.8  CL 98 96* 98  CO2 25  --  24  GLUCOSE 104* 103* 92  BUN 13 15 12   CREATININE 0.58* 0.60* 0.55*  CALCIUM 8.9  --  8.5*    GFR Estimated Creatinine Clearance: 64.6 mL/min (A) (by C-G formula based on SCr of 0.55 mg/dL (L)). Liver Function Tests: Recent Labs  Lab 08/02/22 0446  AST 42*  ALT 18  ALKPHOS 80  BILITOT 0.9  PROT 6.1*  ALBUMIN 2.9*    No results for input(s): "LIPASE", "AMYLASE" in the last 168 hours. No results for input(s): "AMMONIA" in the last 168 hours. Coagulation profile No results for input(s): "INR", "PROTIME" in the last 168 hours. COVID-19 Labs  No results for input(s): "DDIMER", "FERRITIN", "LDH", "CRP" in the last 72 hours.  Lab Results  Component Value Date   [place] NEGATIVE 06/02/2022    CBC: Recent Labs  Lab 08/02/22 0446 08/02/22 0503 08/03/22 0248  WBC 3.8*  --  4.4  NEUTROABS 2.6  --   --   HGB 10.9* 11.6* 10.6*  HCT 32.7* 34.0* 30.5*  MCV 96.7  --  92.7  PLT 141*  --  150    Cardiac Enzymes: No results for input(s): "CKTOTAL", "CKMB", "CKMBINDEX", "TROPONINI" in the last 168 hours. BNP (last 3 results) No results for input(s): "PROBNP" in the last 8760 hours. CBG: No results for input(s): "GLUCAP" in the last 168 hours. D-Dimer: No results for input(s): "DDIMER" in the last  72 hours. Hgb A1c: No results for input(s): "HGBA1C" in the last 72 hours. Lipid Profile: No results for input(s): "CHOL", "HDL", "LDLCALC", "TRIG", "CHOLHDL", "LDLDIRECT" in the last 72 hours. Thyroid function studies: No results for input(s): "TSH", "T4TOTAL", "T3FREE", "THYROIDAB" in the last 72 hours.  Invalid input(s): "FREET3" Anemia work up: No results for input(s): "VITAMINB12", "FOLATE", "FERRITIN", "TIBC", "IRON", "RETICCTPCT" in the last 72 hours. Sepsis Labs: Recent Labs  Lab 08/02/22 0446 08/03/22 0248  WBC 3.8* 4.4    Microbiology No results found for this or any previous visit (from the past 240 hour(s)).   Medications:    apixaban  2.5 mg Oral BID   divalproex  750 mg Oral Daily   furosemide  40 mg Oral Daily  sodium chloride flush  3 mL Intravenous Q12H   tamsulosin  0.4 mg Oral Daily   topiramate  50 mg Oral QHS   traZODone  50 mg Oral QHS   Continuous Infusions:    LOS: 0 days   Charlynne Cousins  [facility]  08/05/2022, 9:12 AM

## 2022-08-06 DIAGNOSIS — W19XXXA Unspecified fall, initial encounter: Secondary | ICD-10-CM | POA: Diagnosis not present

## 2022-08-06 DIAGNOSIS — D61818 Other pancytopenia: Secondary | ICD-10-CM | POA: Diagnosis not present

## 2022-08-06 DIAGNOSIS — S22089A Unspecified fracture of T11-T12 vertebra, initial encounter for closed fracture: Secondary | ICD-10-CM | POA: Diagnosis not present

## 2022-08-06 MED ORDER — TRAMADOL HCL 50 MG PO TABS: 50.0000 mg | ORAL_TABLET | Freq: Four times a day (QID) | ORAL | 0 refills | Status: DC | PRN

## 2022-08-06 NOTE — Progress Notes (Signed)
[facility] PROGRESS NOTE    Progress Note  [name]  [id number] DOB: 1931-04-15 DOA: 08/02/2022 PCP: Sueanne Margarita, DO     Brief Narrative:   Kenneth Bowen is an 89 y.o. male past medical history significant for atrial fibrillation, complete heart block status post pacemaker DVT on Eliquis, chronic back pain parkinsonism comes in after fall at home lives at with his wife in an independent living facility, the wife relates he is fallen 3 times over the last couple of days.  White count of 3.8 hemoglobin of 10.9 sodium 130 CT scan of the head cervical spine chest abdomen and pelvis showed no acute traumatic injury, but was found to have a T12 horizontal vertebral fracture which is chronic and was found to have a mild paravertebral hematoma with edema at T12, CT of the spine showed no epidural hematoma.  Neurosurgery was consulted recommended an MRI of the lumbar spine   Assessment/Plan:   Traumatic closed T12 fracture ([place]) secondary to fall: Appreciate neurosurgery's assistance continue TLSO when out of bed. PT OT consulted will need skilled nursing facility placement. He is on multiple medications that can make him sleepy, and at risk of falls discontinue benzodiazepines.  Trazodone as needed at night.  Continue Topamax. Awaiting skilled nursing facility placement.  Chronic pancytopenia ([place]) Basically unchanged from previous continue to monitor closely.  History of DVT: Continue Eliquis.  History of seizure disorder: Denies any seizures or any loss of control sphincter of biting his tongue continue Depakote.  Elevated LFTs: Likely due to traumatic fracture will monitor intermittently.  BPH: Continue Flomax.   DVT prophylaxis: Eliquis Family Communication:Wife Status is: Observation The patient remains OBS appropriate and will d/c before 2 midnights.    Code Status:     Code Status Orders  (From admission, onward)           Start      Ordered   08/02/22 0916  Do not attempt resuscitation (DNR)  Continuous       Question Answer Comment  If patient has no pulse and is not breathing Do Not Attempt Resuscitation   If patient has a pulse and/or is breathing: Medical Treatment Goals MEDICAL INTERVENTIONS DESIRED: Use advanced airway interventions, mechanical ventilation or cardioversion in appropriate circumstances; Use medication/IV fluids as indicated; Provide comfort medications; Transfer to Progressive/Stepdown/ICU as indicated.   Consent: Discussion documented in EHR or advanced directives reviewed      08/02/22 0920           Code Status History     Date Active Date Inactive Code Status Order ID Comments User Context   06/07/2022 1607 06/09/2022 2014 DNR [id number]  Philis Pique, NP Inpatient   06/02/2022 1338 06/07/2022 1607 Full Code [id number]  Cristal Generous, NP ED   07/28/2016 2023 07/30/2016 1936 Full Code [id number]  Robbie Lis, MD Inpatient   07/12/2016 1748 07/13/2016 1859 Full Code [id number]  Arrien, Jimmy Picket, MD ED      Advance Directive Documentation    Flowsheet Row Most Recent Value  Type of Advance Directive Healthcare Power of Attorney, Living will  Pre-existing out of facility DNR order (yellow form or pink MOST form) --  "MOST" Form in Place? --         IV Access:   Peripheral IV   Procedures and diagnostic studies:   No results found.   Medical Consultants:   None.   Subjective:    Mindi Slicker Muha pain is  controlled.  Objective:    Vitals:   08/05/22 1639 08/05/22 1935 08/06/22 0411 08/06/22 0802  BP: (!) 102/52 (!) 88/55 121/65 119/70  Pulse: 60 (!) 59 60 60  Resp: 18 18 18 18   Temp: 98.2 F (36.8 C)   98 F (36.7 C)  TempSrc: Oral     SpO2: 97% 94% 96% 94%  Weight:      Height:       SpO2: 94 %   Intake/Output Summary (Last 24 hours) at 08/06/2022 0947 Last data filed at 08/05/2022 1500 Gross per 24 hour  Intake 480 ml  Output 900 ml  Net  -420 ml    Filed Weights   08/02/22 0421  Weight: 81.2 kg    Exam: General exam: In no acute distress. Respiratory system: Good air movement and clear to auscultation. Cardiovascular system: S1 & S2 heard, RRR. No JVD. Gastrointestinal system: Abdomen is nondistended, soft and nontender.  Extremities: No pedal edema. Skin: No rashes, lesions or ulcers Psychiatry: Judgement and insight appear normal. Mood & affect appropriate. Data Reviewed:    Labs: Basic Metabolic Panel: Recent Labs  Lab 08/02/22 0446 08/02/22 0503 08/03/22 0248  NA 130* 133* 128*  K 4.2 4.0 3.8  CL 98 96* 98  CO2 25  --  24  GLUCOSE 104* 103* 92  BUN 13 15 12   CREATININE 0.58* 0.60* 0.55*  CALCIUM 8.9  --  8.5*    GFR Estimated Creatinine Clearance: 64.6 mL/min (A) (by C-G formula based on SCr of 0.55 mg/dL (L)). Liver Function Tests: Recent Labs  Lab 08/02/22 0446  AST 42*  ALT 18  ALKPHOS 80  BILITOT 0.9  PROT 6.1*  ALBUMIN 2.9*    No results for input(s): "LIPASE", "AMYLASE" in the last 168 hours. No results for input(s): "AMMONIA" in the last 168 hours. Coagulation profile No results for input(s): "INR", "PROTIME" in the last 168 hours. COVID-19 Labs  No results for input(s): "DDIMER", "FERRITIN", "LDH", "CRP" in the last 72 hours.  Lab Results  Component Value Date   [place] NEGATIVE 06/02/2022    CBC: Recent Labs  Lab 08/02/22 0446 08/02/22 0503 08/03/22 0248  WBC 3.8*  --  4.4  NEUTROABS 2.6  --   --   HGB 10.9* 11.6* 10.6*  HCT 32.7* 34.0* 30.5*  MCV 96.7  --  92.7  PLT 141*  --  150    Cardiac Enzymes: No results for input(s): "CKTOTAL", "CKMB", "CKMBINDEX", "TROPONINI" in the last 168 hours. BNP (last 3 results) No results for input(s): "PROBNP" in the last 8760 hours. CBG: No results for input(s): "GLUCAP" in the last 168 hours. D-Dimer: No results for input(s): "DDIMER" in the last 72 hours. Hgb A1c: No results for input(s): "HGBA1C" in the last  72 hours. Lipid Profile: No results for input(s): "CHOL", "HDL", "LDLCALC", "TRIG", "CHOLHDL", "LDLDIRECT" in the last 72 hours. Thyroid function studies: No results for input(s): "TSH", "T4TOTAL", "T3FREE", "THYROIDAB" in the last 72 hours.  Invalid input(s): "FREET3" Anemia work up: No results for input(s): "VITAMINB12", "FOLATE", "FERRITIN", "TIBC", "IRON", "RETICCTPCT" in the last 72 hours. Sepsis Labs: Recent Labs  Lab 08/02/22 0446 08/03/22 0248  WBC 3.8* 4.4    Microbiology No results found for this or any previous visit (from the past 240 hour(s)).   Medications:    apixaban  2.5 mg Oral BID   divalproex  750 mg Oral Daily   furosemide  40 mg Oral Daily   sodium chloride flush  3  mL Intravenous Q12H   tamsulosin  0.4 mg Oral Daily   topiramate  50 mg Oral QHS   traZODone  50 mg Oral QHS   Continuous Infusions:    LOS: 0 days   Charlynne Cousins  [facility]  08/06/2022, 9:47 AM

## 2022-08-06 NOTE — Discharge Summary (Signed)
Physician Discharge Summary  Kenneth Bowen Audi [id number] DOB: 1930-08-11 DOA: 08/02/2022  PCP: Sueanne Margarita, DO  Admit date: 08/02/2022 Discharge date: 08/07/2022  Admitted From: Home Disposition:  SNF  Recommendations for Outpatient Follow-up:  Follow up with PCP in 1-2 weeks Please obtain BMP/CBC in one week   Home Health:No Equipment/Devices:None  Discharge Condition:Stable CODE STATUS:DNR Diet recommendation: Heart Healthy  Brief/Interim Summary: 89 y.o. male past medical history significant for atrial fibrillation, complete heart block status post pacemaker DVT on Eliquis, chronic back pain parkinsonism comes in after fall at home lives at with his wife in an independent living facility, the wife relates he is fallen 3 times over the last couple of days.  White count of 3.8 hemoglobin of 10.9 sodium 130 CT scan of the head cervical spine chest abdomen and pelvis showed no acute traumatic injury, but was found to have a T12 horizontal vertebral fracture which is chronic and was found to have a mild paravertebral hematoma with edema at T12, CT of the spine showed no epidural hematoma.  Neurosurgery was consulted recommended an MRI of the lumbar spine   Discharge Diagnoses:  Principal Problem:   Falls Active Problems:   Closed T12 fracture (HCC)   Pancytopenia (HCC)   History of DVT (deep vein thrombosis)   Seizure (HCC)   BPH (benign prostatic hyperplasia)   Elevated AST (SGOT)  Traumatic closed T12 fracture secondary to mechanical fall: Neurosurgery was consulted and recommended conservative management with TLSO when out of bed. Physical therapy evaluated the patient recommended skilled nursing facility. He is on multiple medications which can make him sleepy and unstable on his gait his benzodiazepines wears discontinued. He will continue trazodone at night and Topamax.  Chronic pancytopenia: Basically unchanged continue to monitor intermittently.  History of  DVT: Continue Eliquis.  History of seizure disorder: Continue Depakote and Topamax no events.  Elevate LFTs: Due to traumatic fracture they resolved.  BPH: Continue Flomax.  Discharge Instructions  Discharge Instructions     Diet - low sodium heart healthy   Complete by: As directed    Increase activity slowly   Complete by: As directed       Allergies as of 08/07/2022       Reactions   Tape Other (See Comments)   Skin tearing         Medication List     STOP taking these medications    ALPRAZolam 0.5 MG tablet Commonly known as: XANAX   hydrocortisone 2.5 % cream       TAKE these medications    apixaban 2.5 MG Tabs tablet Commonly known as: Eliquis Take 1 tablet (2.5 mg total) by mouth 2 (two) times daily. Resume from 06/10/22   ARTIFICIAL TEAR OP Place 1 drop into both eyes daily as needed (dry eye).   cholecalciferol 1000 units tablet Commonly known as: VITAMIN D Take 2,000 Units by mouth daily.   desoximetasone 0.25 % cream Commonly known as: TOPICORT Apply 1 Application topically at bedtime.   divalproex 250 MG 24 hr tablet Commonly known as: DEPAKOTE ER Take 3 tablets (750 mg total) by mouth daily.   folic acid 1 MG tablet Commonly known as: FOLVITE Take 1 tablet (1 mg total) by mouth daily.   furosemide 40 MG tablet Commonly known as: LASIX Take 40 mg by mouth daily.   IRON PO Take 1 tablet by mouth daily.   lactulose 10 GM/15ML solution Commonly known as: CHRONULAC Take 15 mLs (10 g total) by  mouth 2 (two) times daily.   mometasone 0.1 % ointment Commonly known as: ELOCON Apply 1 application topically 2 (two) times daily as needed (rash).   multivitamin tablet Take 1 tablet by mouth daily.   mupirocin ointment 2 % Commonly known as: BACTROBAN Apply 1 Application topically 2 (two) times daily.   omeprazole 20 MG capsule Commonly known as: PRILOSEC Take 20 mg by mouth daily.   tamsulosin 0.4 MG Caps capsule Commonly  known as: FLOMAX TAKE 1 CAPSULE (0.4 MG TOTAL) BY MOUTH DAILY AFTER SUPPER. What changed: when to take this   topiramate 50 MG tablet Commonly known as: TOPAMAX Take 1 tablet (50 mg total) by mouth at bedtime.   traMADol 50 MG tablet Commonly known as: ULTRAM Take 1 tablet (50 mg total) by mouth every 6 (six) hours as needed for moderate pain.   traZODone 50 MG tablet Commonly known as: DESYREL Take 50 mg by mouth at bedtime.        Allergies  Allergen Reactions   Tape Other (See Comments)    Skin tearing     Consultations: Neurosurgery   Procedures/Studies: MR LUMBAR SPINE WO CONTRAST  Result Date: 08/02/2022 CLINICAL DATA:  Back trauma, abnormal neuro exam. EXAM: MRI LUMBAR SPINE WITHOUT CONTRAST TECHNIQUE: Multiplanar, multisequence MR imaging of the lumbar spine was performed. No intravenous contrast was administered. COMPARISON:  Same-day CT chest/abdomen/pelvis, lumbar spine radiographs 07/21/2019, CT abdomen/pelvis 04/02/2018 FINDINGS: Segmentation: Standard; the lowest formed disc space is designated L5-S1. Alignment: There is grade 1 retrolisthesis of L2 on L3 an S shaped curvature with dextrocurvature centered at the thoracolumbar junction and levocurvature centered at L4-L5. There is no evidence of traumatic malalignment. Vertebrae: There is compression deformity of the T12 vertebral body with significant loss of vertebral body height with a large fluid cleft consistent with acute distracted transverse fracture superimposed on pre-existing chronic compression fracture. There is minimal bony retropulsion without significant spinal canal stenosis. There is no extension into the posterior elements There is compression deformity of the L1 vertebral body with approximately 40% loss of vertebral body height anteriorly. There is marrow edema also consistent acute fracture superimposed on pre-existing compression fracture. There is no epidural hematoma. Additional compression  deformities of the T11 and L2 through L5 vertebral bodies are also noted without marrow edema consistent with chronic fractures, new at L2 and progressed at T11 and L5 since 2019. There is no bony retropulsion at these levels. Background marrow signal is somewhat heterogeneous throughout but without focal or suspicious signal abnormality. Conus medullaris and cauda equina: Conus extends to the T12-L1 level. Conus and cauda equina appear normal. Paraspinal and other soft tissues: There is mild prevertebral edema at T12. The bladder is markedly distended as seen on prior CT. Disc levels: T12-L1: There is minimal bony retropulsion of the posterior T12 endplate but no significant spinal canal or neural foraminal stenosis. L1-L2: There is a minimal disc bulge and mild facet arthropathy with trace effusions but no significant spinal canal or neural foraminal stenosis. L2-L3: There is grade 1 retrolisthesis with a diffuse disc bulge eccentric to the left and mild bilateral facet arthropathy with ligamentum flavum thickening and trace effusions resulting in left subarticular zone narrowing which may affect the traversing left L3 nerve root, and mild left and no significant right neural foraminal stenosis L3-L4: There is diffuse disc bulge eccentric to the left and advanced right and moderate left facet arthropathy with ligamentum flavum thickening resulting in moderate spinal canal stenosis with right worse  than left subarticular zone narrowing and mild bilateral neural foraminal stenosis L4-L5: There is advanced right and moderate left facet arthropathy resulting in mild right and no significant left neural foraminal stenosis and no significant spinal canal stenosis L5-S1: Advanced bilateral facet arthropathy without significant spinal canal or neural foraminal stenosis. IMPRESSION: 1. Acute distracted transverse fracture of the T12 vertebral body superimposed on pre-existing chronic compression fracture with minimal bony  retropulsion but no extension into the posterior elements or significant spinal canal stenosis. No epidural hematoma. 2. Additional acute on chronic fracture of the L1 vertebral body without bony retropulsion or extension into the posterior elements. 3. Chronic compression deformities of the L2 through L5 vertebral bodies without bony retropulsion. 4. Multilevel degenerative changes as detailed above with facet arthropathy most advanced at L3-L4 through L5-S1 resulting in up to moderate spinal canal stenosis at L3-L4. No high-grade neural foraminal stenosis. 5. Distended bladder as seen on prior CT. Electronically Signed   By: Valetta Mole M.D.   On: 08/02/2022 12:18   CT CHEST ABDOMEN PELVIS W CONTRAST  Addendum Date: 08/02/2022   ADDENDUM REPORT: 08/02/2022 07:06 ADDENDUM: Study discussed by telephone with Dr. Quintella Reichert on 08/02/2022 at 0702 hours. Electronically Signed   By: Genevie Ann M.D.   On: 08/02/2022 07:06   Result Date: 08/02/2022 CLINICAL DATA:  89 year old male status post fall.  Found down. EXAM: CT CHEST, ABDOMEN, AND PELVIS WITH CONTRAST TECHNIQUE: Multidetector CT imaging of the chest, abdomen and pelvis was performed following the standard protocol during bolus administration of intravenous contrast. RADIATION DOSE REDUCTION: This exam was performed according to the departmental dose-optimization program which includes automated exposure control, adjustment of the mA and/or kV according to patient size and/or use of iterative reconstruction technique. CONTRAST:  78mL OMNIPAQUE IOHEXOL 350 MG/ML SOLN COMPARISON:  Cervical spine CT, trauma portable chest and pelvis radiographs today. Chest CT 01/02/2021.  CT Abdomen and Pelvis 04/02/2018. Lumbar radiographs 07/21/2019. FINDINGS: CT CHEST FINDINGS Cardiovascular: New left chest cardiac pacemaker device since 2022. Mildly increased cardiac size, borderline cardiomegaly. No pericardial effusion. Calcified coronary artery plaque. Comparatively  mild thoracic aortic plaque. Stable mild tortuosity of the thoracic aorta. Mediastinum/Nodes: Negative. No mediastinal hematoma or lymphadenopathy. Lungs/Pleura: Small layering left pleural effusion with simple fluid density is new since 2022, favor transudate. Mildly lower lung volumes overall. Major airways remain patent. Chronic streaky left lower lobe, lingula lung opacity has not significantly changed when allowing for effusion and lower lung volumes and is likely chronic scarring. Similar platelike opacity in the medial segment of the right middle lobe today. Similar right lower lobe ground-glass and platelike opacity. No pneumothorax. No convincing pulmonary contusion. No convincing pneumonia. Musculoskeletal: Diffuse osteopenia. Widespread flowing thoracic endplate osteophytes result in multilevel thoracic interbody ankylosis. Chronic T2 inferior endplate compression fracture is stable. Chronic T11 mild compression fracture is stable. Chronic severe T12 compression fracture with near vertebra plana. However, acute superimposed distracted horizontal type fracture through the compressed T12 vertebral body (series 7, image 68). The T12 posterior elements appear to remain intact. Superimposed chronic L1 superior endplate compression and the L1 posterior elements also appear intact. No spondylolisthesis at this level. Mild prevertebral hematoma or edema ventral to T12 but no CT evidence of spinal epidural hematoma. Visible shoulder osseous structures and sternum appear to remain intact. And no acute rib fracture is identified. CT ABDOMEN PELVIS FINDINGS Hepatobiliary: Liver and gallbladder appear intact. No perihepatic fluid. Pancreas: Partial fatty atrophy. Spleen: Diminutive, intact.  No perisplenic fluid. Adrenals/Urinary Tract:  Stable and normal for age adrenal glands and kidneys. Symmetric renal enhancement and contrast excretion with decompressed ureters. Distended urinary bladder (890 mL), otherwise  unremarkable. Stomach/Bowel: Redundant but mostly decompressed sigmoid colon in the pelvis and bilateral lower quadrants. Upstream retained stool from the right colon to the descending colon. No large bowel inflammation. Diminutive and normal appendix series 3, image 88. Decompressed terminal ileum and no dilated small bowel. Mostly decompressed stomach and duodenum. No free air or free fluid identified. Vascular/Lymphatic: Mild Aortoiliac calcified atherosclerosis. Major arterial structures in the abdomen and pelvis are patent. Portal venous system is patent on the delayed images. No lymphadenopathy identified. Reproductive: Chronic surgical clips adjacent to the prostate which appears somewhat indistinct but not significantly changed since 2019. Chronic right inguinal hernia repair with surgical clips is stable. Other: No convincing pelvis free fluid. Musculoskeletal: Osteopenia. Moderate chronic L1 compression fracture does not appear significantly changed from 2019. Chronic L2 superior endplate compression fracture new since 2019 but present on the 2021 radiographs. Chronic L3 compression fracture stable since 2019. Chronic L4 compression fracture stable since 2019. Moderate to severe L5 compression fracture is new since 2019 and appears progressed since 2021 although the inferior endplate was compressed at that time. Sacrum, SI joints, pelvis, and proximal femurs appear intact. IMPRESSION: 1. Acute distracted horizontal fracture through the T12 vertebral body which is chronically progressed (near vertebra plana). No posterior element discontinuity to indicate this is a bona fide Chance fracture, but this might be an unstable injury. No spondylolisthesis. Mild prevertebral hematoma or edema at T12 but no CT evidence of spinal epidural hematoma. Recommend Neurosurgery consultation. 2. Widespread other spinal, lumbar compression fractures. But none appear to be brand new when compared to 2021 exams. 2021  radiographs. 3. No other acute traumatic injury identified in the chest, abdomen, or pelvis. 4. Small layering left pleural effusion is new since 2022, favor transudate. Lower lung atelectasis and scarring with no convincing pneumonia. 5. Distended urinary bladder (890 mL).  Query urinary retention. 6.  Aortic Atherosclerosis (ICD10-I70.0). Electronically Signed: By: Genevie Ann M.D. On: 08/02/2022 06:59   CT Head Wo Contrast  Result Date: 08/02/2022 CLINICAL DATA:  89 year old male with history of head trauma from a fall. EXAM: CT HEAD WITHOUT CONTRAST CT CERVICAL SPINE WITHOUT CONTRAST TECHNIQUE: Multidetector CT imaging of the head and cervical spine was performed following the standard protocol without intravenous contrast. Multiplanar CT image reconstructions of the cervical spine were also generated. RADIATION DOSE REDUCTION: This exam was performed according to the departmental dose-optimization program which includes automated exposure control, adjustment of the mA and/or kV according to patient size and/or use of iterative reconstruction technique. COMPARISON:  Head CT 06/02/2022. FINDINGS: CT HEAD FINDINGS Brain: Moderate cerebral and mild cerebellar atrophy. Patchy and confluent areas of decreased attenuation are noted throughout the deep and periventricular white matter of the cerebral hemispheres bilaterally, compatible with chronic microvascular ischemic disease. Several well-defined areas of low attenuation in the basal ganglia bilaterally, compatible with old lacunar infarcts. No evidence of acute infarction, hemorrhage, hydrocephalus, extra-axial collection or mass lesion/mass effect. Vascular: No hyperdense vessel or unexpected calcification. Skull: Normal. Negative for fracture or focal lesion. Sinuses/Orbits: No acute finding. Other: None. CT CERVICAL SPINE FINDINGS Alignment: Normal. Skull base and vertebrae: Chronic appearing compression fracture of C5 with 25% loss of anterior vertebral body  height. No acute fracture. No primary bone lesion or focal pathologic process. Soft tissues and spinal canal: No prevertebral fluid or swelling. No visible canal hematoma. Disc  levels: Severe multilevel degenerative disc disease, most pronounced at C4-C5, C5-C6 and C6-C7. Severe multilevel facet arthropathy bilaterally. Upper chest: Unremarkable. Other: None IMPRESSION: 1. No evidence of significant acute traumatic injury to the skull, brain or cervical spine. 2. 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 3. Severe multilevel degenerative disc disease and cervical spondylosis, as above. Electronically Signed   By: Vinnie Langton M.D.   On: 08/02/2022 06:40   CT Cervical Spine Wo Contrast  Result Date: 08/02/2022 CLINICAL DATA:  89 year old male with history of head trauma from a fall. EXAM: CT HEAD WITHOUT CONTRAST CT CERVICAL SPINE WITHOUT CONTRAST TECHNIQUE: Multidetector CT imaging of the head and cervical spine was performed following the standard protocol without intravenous contrast. Multiplanar CT image reconstructions of the cervical spine were also generated. RADIATION DOSE REDUCTION: This exam was performed according to the departmental dose-optimization program which includes automated exposure control, adjustment of the mA and/or kV according to patient size and/or use of iterative reconstruction technique. COMPARISON:  Head CT 06/02/2022. FINDINGS: CT HEAD FINDINGS Brain: Moderate cerebral and mild cerebellar atrophy. Patchy and confluent areas of decreased attenuation are noted throughout the deep and periventricular white matter of the cerebral hemispheres bilaterally, compatible with chronic microvascular ischemic disease. Several well-defined areas of low attenuation in the  basal ganglia bilaterally, compatible with old lacunar infarcts. No evidence of acute infarction, hemorrhage, hydrocephalus, extra-axial collection or mass lesion/mass effect. Vascular: No hyperdense vessel or unexpected calcification. Skull: Normal. Negative for fracture or focal lesion. Sinuses/Orbits: No acute finding. Other: None. CT CERVICAL SPINE FINDINGS Alignment: Normal. Skull base and vertebrae: Chronic appearing compression fracture of C5 with 25% loss of anterior vertebral body height. No acute fracture. No primary bone lesion or focal pathologic process. Soft tissues and spinal canal: No prevertebral fluid or swelling. No visible canal hematoma. Disc levels: Severe multilevel degenerative disc disease, most pronounced at C4-C5, C5-C6 and C6-C7. Severe multilevel facet arthropathy bilaterally. Upper chest: Unremarkable. Other: None IMPRESSION: 1. No evidence of significant acute traumatic injury to the skull, brain or cervical spine. 2. 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 3. Severe multilevel degenerative disc disease and cervical spondylosis, as above. Electronically Signed   By: Vinnie Langton M.D.   On: 08/02/2022 06:40   DG Pelvis Portable  Result Date: 08/02/2022 CLINICAL DATA:  89 year old male status post fall. EXAM: PORTABLE PELVIS 1-2 VIEWS COMPARISON:  CT Abdomen and Pelvis 04/02/2018. FINDINGS: Portable AP supine view at 0447 hours. Chronic pelvic floor surgical clips redemonstrated and stable. Femoral heads remain normally located. Hip joint spaces appear stable. The pelvis is mildly rotated to the right. No pelvis fracture identified. SI joints and symphysis appear within normal limits. Grossly intact proximal femurs. Partially visible chronic lumbar compression  fractures and dextroconvex scoliosis. Negative visible bowel gas pattern. IMPRESSION: No acute fracture or dislocation identified about the pelvis. Electronically Signed   By: Genevie Ann M.D.   On: 08/02/2022 05:00   DG Chest Port 1 View  Result Date: 08/02/2022 CLINICAL DATA:  89 year old male status post fall. EXAM: PORTABLE CHEST 1 VIEW COMPARISON:  Portable chest 06/05/2022 and earlier. FINDINGS: Portable AP upright view at 0445 hours. Lordotic positioning. Similar  somewhat low lung volumes. Stable left chest dual lead cardiac pacemaker. Mediastinal contours are within normal limits. Visualized tracheal air column is within normal limits. Curvilinear chronic left lung base scarring, demonstrated by CT in 2022. No pneumothorax, pulmonary edema, pleural effusion or acute pulmonary opacity. Paucity of bowel gas in the visible abdomen. Stable visualized osseous structures. IMPRESSION: No acute cardiopulmonary abnormality. Electronically Signed   By: Genevie Ann M.D.   On: 08/02/2022 04:59   (Echo, Carotid, EGD, Colonoscopy, ERCP)    Subjective: No complaints  Discharge Exam: Vitals:   08/07/22 0433 08/07/22 0823  BP: 138/70 (!) 145/71  Pulse: (!) 59 60  Resp: 14   Temp: 97.9 F (36.6 C)   SpO2: 95% 93%   Vitals:   08/06/22 1336 08/06/22 2150 08/07/22 0433 08/07/22 0823  BP: 102/69 103/65 138/70 (!) 145/71  Pulse: 60 64 (!) 59 60  Resp: 18 16 14    Temp: 97.6 F (36.4 C) 98 F (36.7 C) 97.9 F (36.6 C)   TempSrc:  Oral    SpO2:  95% 95% 93%  Weight:      Height:        General: Pt is alert, awake, not in acute distress Cardiovascular: RRR, S1/S2 +, no rubs, no gallops Respiratory: CTA bilaterally, no wheezing, no rhonchi Abdominal: Soft, NT, ND, bowel sounds + Extremities: no edema, no cyanosis    The results of significant diagnostics from this hospitalization (including imaging, microbiology, ancillary and laboratory) are listed below for reference.     Microbiology: No  results found for this or any previous visit (from the past 240 hour(s)).   Labs: BNP (last 3 results) No results for input(s): "BNP" in the last 8760 hours. Basic Metabolic Panel: Recent Labs  Lab 08/02/22 0446 08/02/22 0503 08/03/22 0248  NA 130* 133* 128*  K 4.2 4.0 3.8  CL 98 96* 98  CO2 25  --  24  GLUCOSE 104* 103* 92  BUN 13 15 12   CREATININE 0.58* 0.60* 0.55*  CALCIUM 8.9  --  8.5*   Liver Function Tests: Recent Labs  Lab 08/02/22 0446  AST 42*  ALT 18  ALKPHOS 80  BILITOT 0.9  PROT 6.1*  ALBUMIN 2.9*   No results for input(s): "LIPASE", "AMYLASE" in the last 168 hours. No results for input(s): "AMMONIA" in the last 168 hours. CBC: Recent Labs  Lab 08/02/22 0446 08/02/22 0503 08/03/22 0248  WBC 3.8*  --  4.4  NEUTROABS 2.6  --   --   HGB 10.9* 11.6* 10.6*  HCT 32.7* 34.0* 30.5*  MCV 96.7  --  92.7  PLT 141*  --  150   Cardiac Enzymes: No results for input(s): "CKTOTAL", "CKMB", "CKMBINDEX", "TROPONINI" in the last 168 hours. BNP: Invalid input(s): "POCBNP" CBG: No results for input(s): "GLUCAP" in the last 168 hours. D-Dimer No results for input(s): "DDIMER" in the last 72 hours. Hgb A1c No results for input(s): "HGBA1C" in the last 72 hours. Lipid Profile No results for input(s): "CHOL", "HDL", "LDLCALC", "TRIG", "CHOLHDL", "LDLDIRECT" in the last 72 hours. Thyroid function studies No results for input(s): "TSH", "T4TOTAL", "T3FREE", "THYROIDAB" in the last 72 hours.  Invalid input(s): "FREET3" Anemia work up No results for input(s): "VITAMINB12", "FOLATE", "FERRITIN", "TIBC", "IRON", "RETICCTPCT" in the last 72 hours. Urinalysis    Component Value Date/Time   COLORURINE STRAW (A) 08/02/2022 0845   APPEARANCEUR CLEAR 08/02/2022 0845   LABSPEC 1.013 08/02/2022 0845   PHURINE 8.0 08/02/2022 0845   GLUCOSEU  NEGATIVE 08/02/2022 0845   HGBUR SMALL (A) 08/02/2022 0845   BILIRUBINUR NEGATIVE 08/02/2022 0845   KETONESUR NEGATIVE 08/02/2022  0845   PROTEINUR NEGATIVE 08/02/2022 0845   NITRITE NEGATIVE 08/02/2022 0845   LEUKOCYTESUR NEGATIVE 08/02/2022 0845   Sepsis Labs Recent Labs  Lab 08/02/22 0446 08/03/22 0248  WBC 3.8* 4.4   Microbiology No results found for this or any previous visit (from the past 240 hour(s)).   SIGNED:   Charlynne Cousins, MD  [facility] 08/07/2022, 9:04 AM Pager   If 7PM-7AM, please contact night-coverage [email or web address] Password [email or web address]

## 2022-08-06 NOTE — TOC Progression Note (Addendum)
Transition of Care [facility]) - Progression Note    Patient Details  Name: Kenneth Bowen MRN: [id number] Date of Birth: 1930/08/15  Transition of Care [facility]) CM/SW Contact  [name], LCSW Phone Number: 08/06/2022, 8:49 AM  Clinical Narrative:   CSW spoke with pt regarding SNF choice.  Pt still would like response from [facility].  CSW reached out to [facility] again requesting response.   0930: Per [facility], they are full.  1000: pt updated that [facility] is full.  CSW spoke with pt and wife participated on speakerphone, discussed other bed offers.  Pt asking that [facility] aide from his independent living participate in this decision and she joined the call as well.  Pt encouraged by wife and [place] aide to accept offer at [facility], which pt agreed to.  1100: CSW spoke with [facility], who can extend bed offer.  Per [place], pt does have secondary insurance for copays.  Pt accepts.   1130: insurance auth request submitted in [place].   Expected Discharge Plan: [place] Barriers to Discharge: Continued Medical Work up, SNF Pending bed offer  Expected Discharge Plan and Services In-house Referral: Clinical Social Work   Post Acute Care Choice: [place] Living arrangements for the past 2 months: [place] ([place])                                       Social Determinants of Health (SDOH) Interventions SDOH Screenings   Food Insecurity: No Food Insecurity (08/02/2022)  Housing: Low Risk  (08/02/2022)  Transportation Needs: No Transportation Needs (08/02/2022)  Utilities: Not At Risk (08/02/2022)  Tobacco Use: Low Risk  (08/02/2022)    Readmission Risk Interventions     No data to display

## 2022-08-06 NOTE — Progress Notes (Addendum)
PT, Kenneth Bowen reported she spoke with Ortho dept staff about having the [place] come to adjust pt's brace. Brace is rubbing the pt in the anterior neck and it makes it difficult to eat, swallow, move about.  [place] has not been here today up to this time.  Will need to contact ortho tech again tomorrow morning if company does not come early morning. Pt possible d/c to SNF tomorrow, brace should be adjusted before discharging.

## 2022-08-06 NOTE — Progress Notes (Signed)
Physical Therapy Treatment Patient Details Name: Kenneth Bowen MRN: [id number] DOB: 1931/03/27 Today's Date: 08/06/2022   History of Present Illness Pt is 89 yo male whop resent to ER status post fall at home. Spouse states pt has fallen 3 times in the past couple of days. Lives in [place] with spouse.   CT of the lumbar spine revealed acute T12 compression fracture and L1 compression fracture. PMH: a fib, complete heart block with pacemaker, DVT on eliquis, chronic back pain, parkinsonism. Found chronic T12 horizontal vertebral fracture and mild paravertebral hematoma with edema at T12.    PT Comments    Continuing work on functional mobility and activity tolerance;  Session focused on progressive amb with facilitation of full weight shift onto stance LE to allow for smoother swing; Occasionally sinks back into posterior lean, cues and up to mod assist to correct; Obtained order for Orthotist Consult to modify TLSO for better fit   Recommendations for follow up therapy are one component of a multi-disciplinary discharge planning process, led by the attending physician.  Recommendations may be updated based on patient status, additional functional criteria and insurance authorization.  Follow Up Recommendations  Can patient physically be transported by private vehicle: No    Assistance Recommended at Discharge Frequent or constant Supervision/Assistance  Patient can return home with the following A lot of help with walking and/or transfers;Help with stairs or ramp for entrance;Assistance with cooking/housework;Assist for transportation   Equipment Recommendations  None recommended by PT    Recommendations for Other Services       Precautions / Restrictions Precautions Precautions: Back Precaution Booklet Issued: No Precaution Comments: Back precautions for comfort; did not recall back precautions, but Mobility Specialist relayed that pt log rolled well Required Braces or Orthoses:  Spinal Brace Spinal Brace: Thoracolumbosacral orthotic Spinal Brace Comments: on whenever out of bed Restrictions Weight Bearing Restrictions: No     Mobility  Bed Mobility               General bed mobility comments: Pt EOB wit Mobility Specialist upon arrival; required Heavy mod assist to help elevate trunk to sit per Mobility Specialist    Transfers Overall transfer level: Needs assistance Equipment used: Rolling walker (2 wheels) Transfers: Sit to/from Stand Sit to Stand: Min assist, From elevated surface, Mod assist           General transfer comment: Min assist and multimodal cues to initae with anterior weight shift    Ambulation/Gait Ambulation/Gait assistance: Min assist, Mod assist, +2 safety/equipment (chair follow) Gait Distance (Feet): 65 Feet Assistive device: Rolling walker (2 wheels) Gait Pattern/deviations: Step-to pattern, Decreased step length - right, Decreased step length - left, Festinating       General Gait Details: Multimodal cues and gentle manual facilitation for more fully weight shifting onto stance leg to allow for swing LE advancement; Verbal cues to keep steps slow and long; needed at least 4 stops and standing breaks to get weight back over his feet and regain balance; tends to lose balance posteriorly, and uses stepping strategy (short, small, quick steps) to try to regain balance, without much success and needs up to mod assist to regain balance   Stairs             Wheelchair Mobility    Modified Rankin (Stroke Patients Only)       Balance Overall balance assessment: History of Falls, Needs assistance   Sitting balance-Leahy Scale: Fair Sitting balance - Comments: occasional posterior lean  Postural control: Posterior lean Standing balance support: Bilateral upper extremity supported, Reliant on assistive device for balance Standing balance-Leahy Scale: Poor                              Cognition  Arousal/Alertness: Awake/alert Behavior During Therapy: WFL for tasks assessed/performed Overall Cognitive Status: No family/caregiver present to determine baseline cognitive functioning                                 General Comments: slow processing (Parkinsons) most likely baseline        Exercises      General Comments General comments (skin integrity, edema, etc.): Obtained order for Orthotist [place]  for TLSO fit check      Pertinent Vitals/Pain Pain Assessment Pain Assessment: Faces Faces Pain Scale: Hurts little more Pain Location: low back Pain Descriptors / Indicators: Discomfort, Grimacing, Guarding Pain Intervention(s): Limited activity within patient's tolerance    Home Living                          Prior Function            PT Goals (current goals can now be found in the care plan section) Acute Rehab PT Goals Patient Stated Goal: To get stronger to be able to move better PT Goal Formulation: With patient Time For Goal Achievement: 08/17/22 Potential to Achieve Goals: Fair Progress towards PT goals: Progressing toward goals    Frequency    Min 2X/week      PT Plan Current plan remains appropriate    Co-evaluation              AM-PAC PT "6 Clicks" Mobility   Outcome Measure  Help needed turning from your back to your side while in a flat bed without using bedrails?: A Little Help needed moving from lying on your back to sitting on the side of a flat bed without using bedrails?: A Lot Help needed moving to and from a bed to a chair (including a wheelchair)?: A Little Help needed standing up from a chair using your arms (e.g., wheelchair or bedside chair)?: A Little Help needed to walk in hospital room?: A Lot Help needed climbing 3-5 steps with a railing? : Total 6 Click Score: 14    End of Session Equipment Utilized During Treatment: Gait belt;Back brace Activity Tolerance: Patient tolerated treatment  well Patient left: with call bell/phone within reach;in chair;with chair alarm set Nurse Communication: Mobility status PT Visit Diagnosis: Unsteadiness on feet (R26.81);Other abnormalities of gait and mobility (R26.89);Repeated falls (R29.6)     Time: 1006-1030 PT Time Calculation (min) (ACUTE ONLY): 24 min  Charges:  $Gait Training: 23-37 mins                     Roney [place], [place] [phone]    Colletta [place] 08/06/2022, 1:34 PM

## 2022-08-06 NOTE — Progress Notes (Signed)
Mobility Specialist Progress Note   08/06/22 0950  Mobility  Activity Dangled on edge of bed  Level of Assistance Moderate assist, patient does 50-74%  Assistive Device Other (Comment) (HHA)  Range of Motion/Exercises Active;All extremities  Activity Response Tolerated well   Patient received in supine and agreeable to participate with encouragement. Presented A&Ox4 but displayed some confusion regarding situation and disposition; was able to re-orient. Recalled 0/3 back precautions and was unaware of the need for TLSO. Completed re-edu with receptiveness and teach back of precautions. Was able initiate bed mobility and complete log roll with supervision + tactile cues to use bed railing. Needed heavy mod A to assist from side-lying > sitting. Donned TLSO with max A and noted high profiled fit for superior support. Was left dangling EOB with PT present to resume treatment.    Kenneth Bowen, BS EXP Mobility Specialist Please contact via SecureChat or Rehab office at [phone]

## 2022-08-07 ENCOUNTER — Other Ambulatory Visit (HOSPITAL_COMMUNITY): Payer: Self-pay

## 2022-08-07 DIAGNOSIS — S22089A Unspecified fracture of T11-T12 vertebra, initial encounter for closed fracture: Secondary | ICD-10-CM | POA: Diagnosis not present

## 2022-08-07 DIAGNOSIS — W19XXXA Unspecified fall, initial encounter: Secondary | ICD-10-CM | POA: Diagnosis not present

## 2022-08-07 MED ORDER — APIXABAN 2.5 MG PO TABS
2.5000 mg | ORAL_TABLET | Freq: Two times a day (BID) | ORAL | 0 refills | Status: DC
  Filled 2022-08-07: qty 60, 30d supply, fill #0

## 2022-08-07 NOTE — TOC Progression Note (Signed)
Transition of Care [facility]) - Progression Note    Patient Details  Name: Kenneth Bowen MRN: [id number] Date of Birth: 02-21-1931  Transition of Care [facility]) CM/SW Contact  [name], LCSW Phone Number: 08/07/2022, 9:35 AM  Clinical Narrative:    SNF auth [place] in [id number], [id number], 3 days: 3/25-3/27.  MD notified.  0930: CSW confirmed with [facility] that [facility] can accept pt today.  [place] asking if [facility] pharmacy can provide 30 days of elliquis.   Expected Discharge Plan: [place] Barriers to Discharge: Continued Medical Work up, SNF Pending bed offer  Expected Discharge Plan and Services In-house Referral: Clinical Social Work   Post Acute Care Choice: [place] Living arrangements for the past 2 months: [place] ([place]) Expected Discharge Date: 08/07/22                                     Social Determinants of Health (SDOH) Interventions SDOH Screenings   Food Insecurity: No Food Insecurity (08/02/2022)  Housing: Low Risk  (08/02/2022)  Transportation Needs: No Transportation Needs (08/02/2022)  Utilities: Not At Risk (08/02/2022)  Tobacco Use: Low Risk  (08/02/2022)    Readmission Risk Interventions     No data to display

## 2022-08-07 NOTE — TOC Transition Note (Addendum)
Transition of Care [facility]) - CM/SW Discharge Note   Patient Details  Name: Kenneth Bowen MRN: [id number] Date of Birth: 29-Apr-1931  Transition of Care [facility]) CM/SW Contact:  [name], LCSW Phone Number: 08/07/2022, 10:25 AM   Clinical Narrative:   Pt discharging to [facility].  RN call report to [phone].  1030: per RN, pt needs adjustment to his back brace.  Pt placed on will call with PTAR. 1430: Per RN, pt ready.  Called and place in line with PTAR.  Final next level of care: Skilled Nursing Facility Barriers to Discharge: Barriers Resolved   Patient Goals and CMS Choice CMS Medicare.gov Compare Post Acute Care list provided to:: Patient Choice offered to / list presented to : Patient  Discharge Placement                Patient chooses bed at:  [facility]) Patient to be transferred to facility by: [place] Name of family member notified: wife [name], son Timmothy Sours Patient and family notified of of transfer: 08/07/22  Discharge Plan and Services Additional resources added to the After Visit Summary for   In-house Referral: Clinical Social Work   Post Acute Care Choice: [place]                               Social Determinants of Health (SDOH) Interventions SDOH Screenings   Food Insecurity: No Food Insecurity (08/02/2022)  Housing: Low Risk  (08/02/2022)  Transportation Needs: No Transportation Needs (08/02/2022)  Utilities: Not At Risk (08/02/2022)  Tobacco Use: Low Risk  (08/02/2022)     Readmission Risk Interventions     No data to display

## 2022-08-07 NOTE — Progress Notes (Signed)
Physical Therapy Treatment Patient Details Name: Kenneth Bowen MRN: [id number] DOB: Dec 24, 1930 Today's Date: 08/07/2022   History of Present Illness Pt is 89 yo male whop resent to ER status post fall at home. Spouse states pt has fallen 3 times in the past couple of days. Lives in [place] with spouse.   CT of the lumbar spine revealed acute T12 compression fracture and L1 compression fracture. PMH: a fib, complete heart block with pacemaker, DVT on eliquis, chronic back pain, parkinsonism. Found chronic T12 horizontal vertebral fracture and mild paravertebral hematoma with edema at T12.    PT Comments    Sitting in recliner when PT entered room. Eager to participate with therapy. Reports he is going to SNF today and getting new TLSO. Current TLSO was donned and adjusted for comfort appropriately. Mod assist to stand from recliner today showing posterior lean. Increased ambulatory distance tolerance, focusing on gait symmetry and posture (flexed trunk.) Pt very fatigued towards end of distance. Patient will benefit from continued inpatient follow up therapy, <3 hours/day    Recommendations for follow up therapy are one component of a multi-disciplinary discharge planning process, led by the attending physician.  Recommendations may be updated based on patient status, additional functional criteria and insurance authorization.  Follow Up Recommendations  Can patient physically be transported by private vehicle: No    Assistance Recommended at Discharge Frequent or constant Supervision/Assistance  Patient can return home with the following A lot of help with walking and/or transfers;Help with stairs or ramp for entrance;Assistance with cooking/housework;Assist for transportation   Equipment Recommendations  None recommended by PT    Recommendations for Other Services       Precautions / Restrictions Precautions Precautions: Back Precaution Booklet Issued: No Precaution Comments: Back  precautions for comfort; did not recall back precautions, Required Braces or Orthoses: Spinal Brace Spinal Brace: Thoracolumbosacral orthotic Spinal Brace Comments: Donned in recliner Restrictions Weight Bearing Restrictions: No     Mobility  Bed Mobility               General bed mobility comments: Up in recliner when PT entered room.    Transfers Overall transfer level: Needs assistance Equipment used: Rolling walker (2 wheels) Transfers: Sit to/from Stand Sit to Stand: Mod assist           General transfer comment: Mod assist for boost to stand from recliner. Cues for hand placement, leaning posteriorly. Very slow to rise.    Ambulation/Gait Ambulation/Gait assistance: Min assist Gait Distance (Feet): 115 Feet Assistive device: Rolling walker (2 wheels) Gait Pattern/deviations: Decreased step length - right, Decreased step length - left, Festinating, Step-through pattern, Trunk flexed, Narrow base of support Gait velocity: decreased Gait velocity interpretation: <1.31 ft/sec, indicative of household ambulator   General Gait Details: Educated on safe AD use with RW for support, min assist for turns, progressed to min guard later in distance. Pt eager to achieve further distance today. Focused on gait symmetry with cues to widen BOS. No buckling noted. Slowed down quite a bit and reports LE fatigue towards last 1/3 of distance covered today.   Stairs             Wheelchair Mobility    Modified Rankin (Stroke Patients Only)       Balance Overall balance assessment: History of Falls, Needs assistance Sitting-balance support: No upper extremity supported, Feet supported Sitting balance-Leahy Scale: Fair   Postural control: Posterior lean Standing balance support: Bilateral upper extremity supported, Reliant on assistive  device for balance Standing balance-Leahy Scale: Poor Standing balance comment: Posterior lean initially, progressed to standing with  min guard with RW for support.                            Cognition Arousal/Alertness: Awake/alert Behavior During Therapy: WFL for tasks assessed/performed Overall Cognitive Status: No family/caregiver present to determine baseline cognitive functioning                                 General Comments: slow processing (Parkinsons) most likely baseline        Exercises      General Comments        Pertinent Vitals/Pain Pain Assessment Pain Assessment: Faces Faces Pain Scale: Hurts a little bit Pain Location: low back Pain Descriptors / Indicators: Discomfort, Aching Pain Intervention(s): Monitored during session, Repositioned    Home Living                          Prior Function            PT Goals (current goals can now be found in the care plan section) Acute Rehab PT Goals Patient Stated Goal: To get stronger to be able to move better PT Goal Formulation: With patient Time For Goal Achievement: 08/17/22 Potential to Achieve Goals: Fair Progress towards PT goals: Progressing toward goals    Frequency    Min 2X/week      PT Plan Current plan remains appropriate    Co-evaluation              AM-PAC PT "6 Clicks" Mobility   Outcome Measure  Help needed turning from your back to your side while in a flat bed without using bedrails?: A Little Help needed moving from lying on your back to sitting on the side of a flat bed without using bedrails?: A Lot Help needed moving to and from a bed to a chair (including a wheelchair)?: A Little Help needed standing up from a chair using your arms (e.g., wheelchair or bedside chair)?: A Lot Help needed to walk in hospital room?: A Little Help needed climbing 3-5 steps with a railing? : Total 6 Click Score: 14    End of Session Equipment Utilized During Treatment: Gait belt;Back brace Activity Tolerance: Patient tolerated treatment well Patient left: with call bell/phone  within reach;in chair;with chair alarm set Nurse Communication: Mobility status PT Visit Diagnosis: Unsteadiness on feet (R26.81);Other abnormalities of gait and mobility (R26.89);Repeated falls (R29.6);Difficulty in walking, not elsewhere classified (R26.2)     Time: [id number] PT Time Calculation (min) (ACUTE ONLY): 21 min  Charges:  $Gait Training: 8-22 mins                     Candie Mile, PT, DPT Physical Therapist Acute Rehabilitation Services [place]    Ellouise Newer 08/07/2022, 3:28 PM

## 2022-08-07 NOTE — Progress Notes (Signed)
Occupational Therapy Treatment Patient Details Name: Kenneth Bowen MRN: [id number] DOB: [birth year] Today's Date: 08/07/2022   History of present illness Pt is 89 yo male whop resent to ER status post fall at home. Spouse states pt has fallen 3 times in the past couple of days. Lives in [place] with spouse.   CT of the lumbar spine revealed acute T12 compression fracture and L1 compression fracture. PMH: a fib, complete heart block with pacemaker, DVT on eliquis, chronic back pain, parkinsonism. Found chronic T12 horizontal vertebral fracture and mild paravertebral hematoma with edema at T12.   OT comments  Pt making good progress with functional goals. TLSO to be donned when OOB, however is not fitting properly. [name] nurse to contact ortho tech about TLSO adjustment before d/c to SNF.   Recommendations for follow up therapy are one component of a multi-disciplinary discharge planning process, led by the attending physician.  Recommendations may be updated based on patient status, additional functional criteria and insurance authorization.    Assistance Recommended at Discharge Frequent or constant Supervision/Assistance  Patient can return home with the following  A lot of help with walking and/or transfers;Assist for transportation;Help with stairs or ramp for entrance;Assistance with cooking/housework;A little help with walking and/or transfers   Equipment Recommendations  Other (comment) (TBD at SNF)    Recommendations for Other Services      Precautions / Restrictions Precautions Precautions: Back Precaution Booklet Issued: No Precaution Comments: Back precautions for comfort; did not recall back precautions, Required Braces or Orthoses: Spinal Brace Spinal Brace: Thoracolumbosacral orthotic Spinal Brace Comments: TLSO to be donned when OOB, however is not fitting properly. [name] nurse to contact ortho about TLSO adjustment before d/c Restrictions Weight Bearing Restrictions: No        Mobility Bed Mobility               General bed mobility comments: pt in recliner upon arrival, TLSO not on due to not fitting propelry. [name] nurse aware and contacting ortho techs/ Pt agreeabe to wear to work with OT    Transfers Overall transfer level: Needs assistance Equipment used: Rolling walker (2 wheels) Transfers: Sit to/from Stand Sit to Stand: Min assist     Step pivot transfers: Min assist           Balance Overall balance assessment: History of Falls, Needs assistance Sitting-balance support: Bilateral upper extremity supported Sitting balance-Leahy Scale: Fair     Standing balance support: Bilateral upper extremity supported, Reliant on assistive device for balance                               ADL either performed or assessed with clinical judgement   ADL Overall ADL's : Needs assistance/impaired         Upper Body Bathing: Supervision/ safety;Set up;Sitting Upper Body Bathing Details (indicate cue type and reason): simulated seated in recliner Lower Body Bathing: Moderate assistance;Sit to/from stand Lower Body Bathing Details (indicate cue type and reason): initiated LB bathing A/E use education Upper Body Dressing : Minimal assistance;Sitting       Toilet Transfer: Minimal assistance;Rolling walker (2 wheels);BSC/3in1;Stand-pivot   Toileting- Clothing Manipulation and Hygiene: Maximal assistance;Sit to/from stand       Functional mobility during ADLs: Minimal assistance;Rolling walker (2 wheels);Cueing for safety General ADL Comments: reviewed education regarding back precautions, initiated ADL A/E education    Extremity/Trunk Assessment Upper Extremity Assessment Upper Extremity Assessment: Generalized weakness  Lower Extremity Assessment Lower Extremity Assessment: Defer to PT evaluation        Vision Baseline Vision/History: 1 Wears glasses Patient Visual Report: No change from baseline     Perception      Praxis      Cognition Arousal/Alertness: Awake/alert Behavior During Therapy: WFL for tasks assessed/performed Overall Cognitive Status: No family/caregiver present to determine baseline cognitive functioning                                 General Comments: slow processing (Parkinsons) most likely baseline        Exercises      Shoulder Instructions       General Comments      Pertinent Vitals/ Pain       Pain Assessment Pain Assessment: 0-10 Pain Score: 2  Pain Location: low back Pain Descriptors / Indicators: Discomfort, Aching Pain Intervention(s): Monitored during session, Repositioned  Home Living                                          Prior Functioning/Environment              Frequency  Min 2X/week        Progress Toward Goals  OT Goals(current goals can now be found in the care plan section)  Progress towards OT goals: Progressing toward goals     Plan Discharge plan remains appropriate;Frequency remains appropriate    Co-evaluation                 AM-PAC OT "6 Clicks" Daily Activity     Outcome Measure   Help from another person eating meals?: None Help from another person taking care of personal grooming?: A Little Help from another person toileting, which includes using toliet, bedpan, or urinal?: A Lot Help from another person bathing (including washing, rinsing, drying)?: A Lot Help from another person to put on and taking off regular upper body clothing?: A Little Help from another person to put on and taking off regular lower body clothing?: A Lot 6 Click Score: 16    End of Session Equipment Utilized During Treatment: Gait belt;Rolling walker (2 wheels)  OT Visit Diagnosis: Unsteadiness on feet (R26.81);Other abnormalities of gait and mobility (R26.89);Repeated falls (R29.6);Muscle weakness (generalized) (M62.81);History of falling (Z91.81);Pain Pain - part of body:  (back)    Activity Tolerance Patient tolerated treatment well   Patient Left with call bell/phone within reach;with bed alarm set;in chair;with chair alarm set   Nurse Communication          Time: [id number] OT Time Calculation (min): 19 min  Charges: OT General Charges $OT Visit: 1 Visit OT Treatments $Self Care/Home Management : 8-22 mins    Britt Bottom 08/07/2022, 12:34 PM

## 2022-08-07 NOTE — Progress Notes (Signed)
RN attempted to call report x3 times. RN left voicemail for them to call if they wanted report.

## 2022-08-08 ENCOUNTER — Other Ambulatory Visit (HOSPITAL_COMMUNITY): Payer: Self-pay

## 2022-08-15 ENCOUNTER — Telehealth: Payer: Self-pay | Admitting: Hematology and Oncology

## 2022-08-15 NOTE — Telephone Encounter (Signed)
Patient called from rehab center to reschedule 4/4 appointment. Patient rescheduled and notified.

## 2022-08-16 ENCOUNTER — Inpatient Hospital Stay: Payer: Medicare Other

## 2022-08-16 ENCOUNTER — Inpatient Hospital Stay: Payer: Medicare Other | Admitting: Hematology and Oncology

## 2022-08-20 DIAGNOSIS — N398 Other specified disorders of urinary system: Secondary | ICD-10-CM | POA: Diagnosis not present

## 2022-08-21 ENCOUNTER — Telehealth: Payer: Self-pay | Admitting: Hematology and Oncology

## 2022-08-21 NOTE — Telephone Encounter (Signed)
patient called to verify time and date of appointments.

## 2022-08-27 ENCOUNTER — Encounter: Payer: Self-pay | Admitting: Neurology

## 2022-08-27 ENCOUNTER — Institutional Professional Consult (permissible substitution): Payer: Medicare Other | Admitting: Neurology

## 2022-08-29 ENCOUNTER — Other Ambulatory Visit: Payer: Self-pay | Admitting: Hematology and Oncology

## 2022-08-29 ENCOUNTER — Ambulatory Visit: Payer: Medicare Other | Admitting: Adult Health

## 2022-08-29 DIAGNOSIS — I82411 Acute embolism and thrombosis of right femoral vein: Secondary | ICD-10-CM

## 2022-08-29 NOTE — Progress Notes (Unsigned)
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 Started on Eliqiuis 5mg  BID.  08/17/2020: establish care with Dr. Leonides Schanz    Interval History:  Kenneth Bowen 89 y.o. male with medical history significant for an unprovoked RLE DVT who presents for a follow up visit. The patient's last visit was on 02/14/2022. In the interim since the last visit he has continued to faithfully take his Eliquis therapy.  On exam today Kenneth Bowen reports he has been well overall in the last visit.  He unfortunately is wearing a back brace.  He reports that unfortunately he fell again when he got home and x-ray showed that he had damaged to vertebra.  He notes that he will continue wearing this brace until he has an upcoming appointment in May 2024.  He continues taking his Eliquis therapy faithfully with no trouble with the medication.  He is not having any bleeding, bruising, or dark stools.  He reports that he does tend to feel quite weak and sleeps a lot during the day.  He has been having periodic swelling of his lower extremities bilaterally but they tend to swell worse during the day and improves at night.  He notes he is not having any pain or erythema in his legs.  He reports that he has been breathing well and feels like he gets enough air though he does frequently have to readjust in his back brace.  It also annoys him when he is trying to eat.  He is weight has dropped about 15 pounds though he does have a good appetite he thinks the food at his  old facility was unpleasing and in small portions.  He currently denies any fevers, chills, sweats, nausea, vomiting or diarrhea.  A full 10 point ROS is listed below  MEDICAL HISTORY:  Past Medical History:  Diagnosis Date   Bradycardia 06/02/2022   Colon polyps    Diverticulosis    Encephalopathy acute 06/02/2022   Hypothermia 06/02/2022   Prostate cancer (HCC) 2015   radiation finished Nov 2015   Seizures (HCC) 07/2016   Sepsis (HCC) 06/02/2022   Unresponsiveness 07/28/2016    SURGICAL HISTORY: Past Surgical History:  Procedure Laterality Date   colonscopy     INGUINAL HERNIA REPAIR     KNEE SURGERY     PACEMAKER IMPLANT N/A 06/04/2022   Procedure: PACEMAKER IMPLANT;  Surgeon: Marinus Maw, MD;  Location: [facility];  Service: Cardiovascular;  Laterality: N/A;   PROSTATE BIOPSY     TEMPORARY PACEMAKER N/A 06/03/2022   Procedure: TEMPORARY PACEMAKER;  Surgeon: Dolores Patty, MD;  Location: [facility] CV [facility];  Service: Cardiovascular;  Laterality: N/A;    SOCIAL HISTORY: Social History   Socioeconomic History   Marital status: Married    Spouse name: Not on file   Number of children: Not on file   Years of education: Not on file   Highest education level: Not on file  Occupational History   Occupation: civil Art gallery manager  Tobacco Use   Smoking status: Never   Smokeless tobacco: Never  Substance and Sexual  Activity   Alcohol use: Yes    Alcohol/week: 7.0 standard drinks of alcohol    Types: 7 Glasses of wine per week    Comment: 4 oz nightly   Drug use: No   Sexual activity: Yes  Other Topics Concern   Not on file  Social History Narrative   Lives with wife at [facility] [place].  Uses cane.     Social Determinants of Health   Financial Resource Strain: Not on file  Food Insecurity: No Food Insecurity (08/02/2022)   Hunger Vital Sign    Worried About Running Out of Food in the Last Year: Never true    Ran Out of Food in the  Last Year: Never true  Transportation Needs: No Transportation Needs (08/02/2022)   PRAPARE - Administrator, Civil Service (Medical): No    Lack of Transportation (Non-Medical): No  Physical Activity: Not on file  Stress: Not on file  Social Connections: Not on file  Intimate Partner Violence: Not At Risk (08/02/2022)   Humiliation, Afraid, Rape, and Kick questionnaire    Fear of Current or Ex-Partner: No    Emotionally Abused: No    Physically Abused: No    Sexually Abused: No    FAMILY HISTORY: Family History  Problem Relation Age of Onset   Alcoholism Father    Cancer Neg Hx    Colon cancer Neg Hx     ALLERGIES:  is allergic to tape.  MEDICATIONS:  Current Outpatient Medications  Medication Sig Dispense Refill   apixaban (ELIQUIS) 2.5 MG TABS tablet Take 1 tablet (2.5 mg total) by mouth 2 (two) times daily. Resume from 06/10/22 60 tablet 0   ARTIFICIAL TEAR OP Place 1 drop into both eyes daily as needed (dry eye).     cholecalciferol (VITAMIN D) 1000 UNITS tablet Take 2,000 Units by mouth daily.      desoximetasone (TOPICORT) 0.25 % cream Apply 1 Application topically at bedtime.     divalproex (DEPAKOTE ER) 250 MG 24 hr tablet Take 3 tablets (750 mg total) by mouth daily. 90 tablet 0   Ferrous Sulfate (IRON PO) Take 1 tablet by mouth daily.     folic acid (FOLVITE) 1 MG tablet Take 1 tablet (1 mg total) by mouth daily. (Patient not taking: Reported on 08/02/2022) 30 tablet 0   furosemide (LASIX) 40 MG tablet Take 40 mg by mouth daily.     lactulose (CHRONULAC) 10 GM/15ML solution Take 15 mLs (10 g total) by mouth 2 (two) times daily. (Patient not taking: Reported on 08/02/2022) 236 mL 0   mometasone (ELOCON) 0.1 % ointment Apply 1 application topically 2 (two) times daily as needed (rash).      Multiple Vitamin (MULTIVITAMIN) tablet Take 1 tablet by mouth daily.     mupirocin ointment (BACTROBAN) 2 % Apply 1 Application topically 2 (two) times daily.     omeprazole  (PRILOSEC) 20 MG capsule Take 20 mg by mouth daily.     tamsulosin (FLOMAX) 0.4 MG CAPS capsule TAKE 1 CAPSULE (0.4 MG TOTAL) BY MOUTH DAILY AFTER SUPPER. (Patient taking differently: Take 0.4 mg by mouth daily.) 30 capsule 5   topiramate (TOPAMAX) 50 MG tablet Take 1 tablet (50 mg total) by mouth at bedtime. 90 tablet 3   traMADol (ULTRAM) 50 MG tablet Take 1 tablet (50 mg total) by mouth every 6 (six) hours as needed for moderate pain. 10 tablet 0   traZODone (DESYREL) 50 MG tablet Take  50 mg by mouth at bedtime.     No current facility-administered medications for this visit.    REVIEW OF SYSTEMS:   Constitutional: ( - ) fevers, ( - )  chills , ( - ) night sweats Eyes: ( - ) blurriness of vision, ( - ) double vision, ( - ) watery eyes Ears, nose, mouth, throat, and face: ( - ) mucositis, ( - ) sore throat Respiratory: ( - ) cough, ( - ) dyspnea, ( - ) wheezes Cardiovascular: ( - ) palpitation, ( - ) chest discomfort, ( - ) lower extremity swelling Gastrointestinal:  ( - ) nausea, ( - ) heartburn, ( - ) change in bowel habits Skin: ( - ) abnormal skin rashes Lymphatics: ( - ) new lymphadenopathy, ( - ) easy bruising Neurological: ( - ) numbness, ( - ) tingling, ( - ) new weaknesses Behavioral/Psych: ( - ) mood change, ( - ) new changes  All other systems were reviewed with the patient and are negative.  PHYSICAL EXAMINATION:  Vitals:   08/30/22 1412  BP: 101/62  Pulse: 60  Resp: 13  Temp: (!) 97 F (36.1 C)  SpO2: 100%     Filed Weights   08/30/22 1412  Weight: 170 lb 9.6 oz (77.4 kg)    GENERAL: Well-appearing elderly Caucasian male, alert, no distress and comfortable SKIN: skin color, texture, turgor are normal, no rashes or significant lesions EYES: conjunctiva are pink and non-injected, sclera clear LUNGS: clear to auscultation and percussion with normal breathing effort HEART: regular rate & rhythm and no murmurs and no lower extremity edema Musculoskeletal: no  cyanosis of digits and no clubbing  PSYCH: alert & oriented x 3, fluent speech NEURO: no focal motor/sensory deficits  LABORATORY DATA:  I have reviewed the data as listed    Latest Ref Rng & Units 08/30/2022    1:21 PM 08/03/2022    2:48 AM 08/02/2022    5:03 AM  CBC  WBC 4.0 - 10.5 K/uL 4.2  4.4    Hemoglobin 13.0 - 17.0 g/dL 16.1  09.6  04.5   Hematocrit 39.0 - 52.0 % 32.3  30.5  34.0   Platelets 150 - 400 K/uL 184  150         Latest Ref Rng & Units 08/30/2022    1:21 PM 08/03/2022    2:48 AM 08/02/2022    5:03 AM  CMP  Glucose 70 - 99 mg/dL 91  92  409   BUN 8 - 23 mg/dL 17  12  15    Creatinine 0.61 - 1.24 mg/dL 8.11  9.14  7.82   Sodium 135 - 145 mmol/L 135  128  133   Potassium 3.5 - 5.1 mmol/L 4.2  3.8  4.0   Chloride 98 - 111 mmol/L 104  98  96   CO2 22 - 32 mmol/L 26  24    Calcium 8.9 - 10.3 mg/dL 9.4  8.5    Total Protein 6.5 - 8.1 g/dL 6.9     Total Bilirubin 0.3 - 1.2 mg/dL 0.2     Alkaline Phos 38 - 126 U/L 122     AST 15 - 41 U/L 19     ALT 0 - 44 U/L 16      RADIOGRAPHIC STUDIES: MR LUMBAR SPINE WO CONTRAST  Result Date: 08/02/2022 CLINICAL DATA:  Back trauma, abnormal neuro exam. EXAM: MRI LUMBAR SPINE WITHOUT CONTRAST TECHNIQUE: Multiplanar, multisequence MR imaging of the lumbar spine was performed.  No intravenous contrast was administered. COMPARISON:  Same-day CT chest/abdomen/pelvis, lumbar spine radiographs 07/21/2019, CT abdomen/pelvis 04/02/2018 FINDINGS: Segmentation: Standard; the lowest formed disc space is designated L5-S1. Alignment: There is grade 1 retrolisthesis of L2 on L3 an S shaped curvature with dextrocurvature centered at the thoracolumbar junction and levocurvature centered at L4-L5. There is no evidence of traumatic malalignment. Vertebrae: There is compression deformity of the T12 vertebral body with significant loss of vertebral body height with a large fluid cleft consistent with acute distracted transverse fracture superimposed on  pre-existing chronic compression fracture. There is minimal bony retropulsion without significant spinal canal stenosis. There is no extension into the posterior elements There is compression deformity of the L1 vertebral body with approximately 40% loss of vertebral body height anteriorly. There is marrow edema also consistent acute fracture superimposed on pre-existing compression fracture. There is no epidural hematoma. Additional compression deformities of the T11 and L2 through L5 vertebral bodies are also noted without marrow edema consistent with chronic fractures, new at L2 and progressed at T11 and L5 since 2019. There is no bony retropulsion at these levels. Background marrow signal is somewhat heterogeneous throughout but without focal or suspicious signal abnormality. Conus medullaris and cauda equina: Conus extends to the T12-L1 level. Conus and cauda equina appear normal. Paraspinal and other soft tissues: There is mild prevertebral edema at T12. The bladder is markedly distended as seen on prior CT. Disc levels: T12-L1: There is minimal bony retropulsion of the posterior T12 endplate but no significant spinal canal or neural foraminal stenosis. L1-L2: There is a minimal disc bulge and mild facet arthropathy with trace effusions but no significant spinal canal or neural foraminal stenosis. L2-L3: There is grade 1 retrolisthesis with a diffuse disc bulge eccentric to the left and mild bilateral facet arthropathy with ligamentum flavum thickening and trace effusions resulting in left subarticular zone narrowing which may affect the traversing left L3 nerve root, and mild left and no significant right neural foraminal stenosis L3-L4: There is diffuse disc bulge eccentric to the left and advanced right and moderate left facet arthropathy with ligamentum flavum thickening resulting in moderate spinal canal stenosis with right worse than left subarticular zone narrowing and mild bilateral neural foraminal  stenosis L4-L5: There is advanced right and moderate left facet arthropathy resulting in mild right and no significant left neural foraminal stenosis and no significant spinal canal stenosis L5-S1: Advanced bilateral facet arthropathy without significant spinal canal or neural foraminal stenosis. IMPRESSION: 1. Acute distracted transverse fracture of the T12 vertebral body superimposed on pre-existing chronic compression fracture with minimal bony retropulsion but no extension into the posterior elements or significant spinal canal stenosis. No epidural hematoma. 2. Additional acute on chronic fracture of the L1 vertebral body without bony retropulsion or extension into the posterior elements. 3. Chronic compression deformities of the L2 through L5 vertebral bodies without bony retropulsion. 4. Multilevel degenerative changes as detailed above with facet arthropathy most advanced at L3-L4 through L5-S1 resulting in up to moderate spinal canal stenosis at L3-L4. No high-grade neural foraminal stenosis. 5. Distended bladder as seen on prior CT. Electronically Signed   By: Lesia Hausen M.D.   On: 08/02/2022 12:18   CT CHEST ABDOMEN PELVIS W CONTRAST  Addendum Date: 08/02/2022   ADDENDUM REPORT: 08/02/2022 07:06 ADDENDUM: Study discussed by telephone with Dr. Tilden Fossa on 08/02/2022 at 0702 hours. Electronically Signed   By: Odessa Fleming M.D.   On: 08/02/2022 07:06   Result Date: 08/02/2022 CLINICAL DATA:  89 year old male status post fall.  Found down. EXAM: CT CHEST, ABDOMEN, AND PELVIS WITH CONTRAST TECHNIQUE: Multidetector CT imaging of the chest, abdomen and pelvis was performed following the standard protocol during bolus administration of intravenous contrast. RADIATION DOSE REDUCTION: This exam was performed according to the departmental dose-optimization program which includes automated exposure control, adjustment of the mA and/or kV according to patient size and/or use of iterative reconstruction  technique. CONTRAST:  75mL OMNIPAQUE IOHEXOL 350 MG/ML SOLN COMPARISON:  Cervical spine CT, trauma portable chest and pelvis radiographs today. Chest CT 01/02/2021.  CT Abdomen and Pelvis 04/02/2018. Lumbar radiographs 07/21/2019. FINDINGS: CT CHEST FINDINGS Cardiovascular: New left chest cardiac pacemaker device since 2022. Mildly increased cardiac size, borderline cardiomegaly. No pericardial effusion. Calcified coronary artery plaque. Comparatively mild thoracic aortic plaque. Stable mild tortuosity of the thoracic aorta. Mediastinum/Nodes: Negative. No mediastinal hematoma or lymphadenopathy. Lungs/Pleura: Small layering left pleural effusion with simple fluid density is new since 2022, favor transudate. Mildly lower lung volumes overall. Major airways remain patent. Chronic streaky left lower lobe, lingula lung opacity has not significantly changed when allowing for effusion and lower lung volumes and is likely chronic scarring. Similar platelike opacity in the medial segment of the right middle lobe today. Similar right lower lobe ground-glass and platelike opacity. No pneumothorax. No convincing pulmonary contusion. No convincing pneumonia. Musculoskeletal: Diffuse osteopenia. Widespread flowing thoracic endplate osteophytes result in multilevel thoracic interbody ankylosis. Chronic T2 inferior endplate compression fracture is stable. Chronic T11 mild compression fracture is stable. Chronic severe T12 compression fracture with near vertebra plana. However, acute superimposed distracted horizontal type fracture through the compressed T12 vertebral body (series 7, image 68). The T12 posterior elements appear to remain intact. Superimposed chronic L1 superior endplate compression and the L1 posterior elements also appear intact. No spondylolisthesis at this level. Mild prevertebral hematoma or edema ventral to T12 but no CT evidence of spinal epidural hematoma. Visible shoulder osseous structures and sternum  appear to remain intact. And no acute rib fracture is identified. CT ABDOMEN PELVIS FINDINGS Hepatobiliary: Liver and gallbladder appear intact. No perihepatic fluid. Pancreas: Partial fatty atrophy. Spleen: Diminutive, intact.  No perisplenic fluid. Adrenals/Urinary Tract: Stable and normal for age adrenal glands and kidneys. Symmetric renal enhancement and contrast excretion with decompressed ureters. Distended urinary bladder (890 mL), otherwise unremarkable. Stomach/Bowel: Redundant but mostly decompressed sigmoid colon in the pelvis and bilateral lower quadrants. Upstream retained stool from the right colon to the descending colon. No large bowel inflammation. Diminutive and normal appendix series 3, image 88. Decompressed terminal ileum and no dilated small bowel. Mostly decompressed stomach and duodenum. No free air or free fluid identified. Vascular/Lymphatic: Mild Aortoiliac calcified atherosclerosis. Major arterial structures in the abdomen and pelvis are patent. Portal venous system is patent on the delayed images. No lymphadenopathy identified. Reproductive: Chronic surgical clips adjacent to the prostate which appears somewhat indistinct but not significantly changed since 2019. Chronic right inguinal hernia repair with surgical clips is stable. Other: No convincing pelvis free fluid. Musculoskeletal: Osteopenia. Moderate chronic L1 compression fracture does not appear significantly changed from 2019. Chronic L2 superior endplate compression fracture new since 2019 but present on the 2021 radiographs. Chronic L3 compression fracture stable since 2019. Chronic L4 compression fracture stable since 2019. Moderate to severe L5 compression fracture is new since 2019 and appears progressed since 2021 although the inferior endplate was compressed at that time. Sacrum, SI joints, pelvis, and proximal femurs appear intact. IMPRESSION: 1. Acute distracted horizontal fracture through the T12  vertebral body which  is chronically progressed (near vertebra plana). No posterior element discontinuity to indicate this is a bona fide Chance fracture, but this might be an unstable injury. No spondylolisthesis. Mild prevertebral hematoma or edema at T12 but no CT evidence of spinal epidural hematoma. Recommend Neurosurgery consultation. 2. Widespread other spinal, lumbar compression fractures. But none appear to be brand new when compared to 2021 exams. 2021 radiographs. 3. No other acute traumatic injury identified in the chest, abdomen, or pelvis. 4. Small layering left pleural effusion is new since 2022, favor transudate. Lower lung atelectasis and scarring with no convincing pneumonia. 5. Distended urinary bladder (890 mL).  Query urinary retention. 6.  Aortic Atherosclerosis (ICD10-I70.0). Electronically Signed: By: Odessa Fleming M.D. On: 08/02/2022 06:59   CT Head Wo Contrast  Result Date: 08/02/2022 CLINICAL DATA:  89 year old male with history of head trauma from a fall. EXAM: CT HEAD WITHOUT CONTRAST CT CERVICAL SPINE WITHOUT CONTRAST TECHNIQUE: Multidetector CT imaging of the head and cervical spine was performed following the standard protocol without intravenous contrast. Multiplanar CT image reconstructions of the cervical spine were also generated. RADIATION DOSE REDUCTION: This exam was performed according to the departmental dose-optimization program which includes automated exposure control, adjustment of the mA and/or kV according to patient size and/or use of iterative reconstruction technique. COMPARISON:  Head CT 06/02/2022. FINDINGS: CT HEAD FINDINGS Brain: Moderate cerebral and mild cerebellar atrophy. Patchy and confluent areas of decreased attenuation are noted throughout the deep and periventricular white matter of the cerebral hemispheres bilaterally, compatible with chronic microvascular ischemic disease. Several well-defined areas of low attenuation in the basal ganglia bilaterally, compatible with old  lacunar infarcts. No evidence of acute infarction, hemorrhage, hydrocephalus, extra-axial collection or mass lesion/mass effect. Vascular: No hyperdense vessel or unexpected calcification. Skull: Normal. Negative for fracture or focal lesion. Sinuses/Orbits: No acute finding. Other: None. CT CERVICAL SPINE FINDINGS Alignment: Normal. Skull base and vertebrae: Chronic appearing compression fracture of C5 with 25% loss of anterior vertebral body height. No acute fracture. No primary bone lesion or focal pathologic process. Soft tissues and spinal canal: No prevertebral fluid or swelling. No visible canal hematoma. Disc levels: Severe multilevel degenerative disc disease, most pronounced at C4-C5, C5-C6 and C6-C7. Severe multilevel facet arthropathy bilaterally. Upper chest: Unremarkable. Other: None IMPRESSION: 1. No evidence of significant acute traumatic injury to the skull, brain or cervical spine. 2. 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 3. Severe multilevel degenerative disc disease and cervical spondylosis, as above. Electronically Signed   By: Trudie Reed M.D.   On: 08/02/2022 06:40   CT Cervical Spine Wo Contrast  Result Date: 08/02/2022 CLINICAL DATA:  89 year old male with history of head trauma from a fall. EXAM: CT HEAD WITHOUT CONTRAST CT CERVICAL SPINE WITHOUT CONTRAST TECHNIQUE: Multidetector CT imaging of the head and cervical spine was performed following the standard protocol without intravenous contrast. Multiplanar CT image reconstructions of the cervical spine were also generated. RADIATION DOSE REDUCTION: This exam was performed according to the departmental dose-optimization program which includes automated exposure control, adjustment of the mA and/or kV according  to patient size and/or use of iterative reconstruction technique. COMPARISON:  Head CT 06/02/2022. FINDINGS: CT HEAD FINDINGS Brain: Moderate cerebral and mild cerebellar atrophy. Patchy and confluent areas of decreased attenuation are noted throughout the deep and periventricular white  matter of the cerebral hemispheres bilaterally, compatible with chronic microvascular ischemic disease. Several well-defined areas of low attenuation in the basal ganglia bilaterally, compatible with old lacunar infarcts. No evidence of acute infarction, hemorrhage, hydrocephalus, extra-axial collection or mass lesion/mass effect. Vascular: No hyperdense vessel or unexpected calcification. Skull: Normal. Negative for fracture or focal lesion. Sinuses/Orbits: No acute finding. Other: None. CT CERVICAL SPINE FINDINGS Alignment: Normal. Skull base and vertebrae: Chronic appearing compression fracture of C5 with 25% loss of anterior vertebral body height. No acute fracture. No primary bone lesion or focal pathologic process. Soft tissues and spinal canal: No prevertebral fluid or swelling. No visible canal hematoma. Disc levels: Severe multilevel degenerative disc disease, most pronounced at C4-C5, C5-C6 and C6-C7. Severe multilevel facet arthropathy bilaterally. Upper chest: Unremarkable. Other: None IMPRESSION: 1. No evidence of significant acute traumatic injury to the skull, brain or cervical spine. 2. 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 3. Severe multilevel degenerative disc disease and cervical spondylosis, as above. Electronically Signed   By: Trudie Reed M.D.   On: 08/02/2022 06:40   DG Pelvis Portable  Result Date: 08/02/2022 CLINICAL DATA:  89 year old male status post fall. EXAM: PORTABLE PELVIS  1-2 VIEWS COMPARISON:  CT Abdomen and Pelvis 04/02/2018. FINDINGS: Portable AP supine view at 0447 hours. Chronic pelvic floor surgical clips redemonstrated and stable. Femoral heads remain normally located. Hip joint spaces appear stable. The pelvis is mildly rotated to the right. No pelvis fracture identified. SI joints and symphysis appear within normal limits. Grossly intact proximal femurs. Partially visible chronic lumbar compression fractures and dextroconvex scoliosis. Negative visible bowel gas pattern. IMPRESSION: No acute fracture or dislocation identified about the pelvis. Electronically Signed   By: Odessa Fleming M.D.   On: 08/02/2022 05:00   DG Chest Port 1 View  Result Date: 08/02/2022 CLINICAL DATA:  89 year old male status post fall. EXAM: PORTABLE CHEST 1 VIEW COMPARISON:  Portable chest 06/05/2022 and earlier. FINDINGS: Portable AP upright view at 0445 hours. Lordotic positioning. Similar somewhat low lung volumes. Stable left chest dual lead cardiac pacemaker. Mediastinal contours are within normal limits. Visualized tracheal air column is within normal limits. Curvilinear chronic left lung base scarring, demonstrated by CT in 2022. No pneumothorax, pulmonary edema, pleural effusion or acute pulmonary opacity. Paucity of bowel gas in the visible abdomen. Stable visualized osseous structures. IMPRESSION: No acute cardiopulmonary abnormality. Electronically Signed   By: Odessa Fleming M.D.   On: 08/02/2022 04:59    ASSESSMENT & PLAN Kenneth Bowen 89 y.o. male with medical history significant for an unprovoked RLE DVT who presents for a follow up visit.  After review the labs, review the records, discussed with the patient the findings most consistent with an unprovoked right lower extremity DVT.  He is currently taking Eliquis therapy 5 mg p.o. twice daily and tolerating it well.  Unfortunately given the unprovoked nature of this clot the recommendation would be for lifelong anticoagulation.   Previously we discussed the risks and benefits of anticoagulation therapy and the plan moving forward.  Given the unprovoked nature of this clot we do not know what caused it and therefore the recommendation would be for indefinite anticoagulation.  He is at advanced age and unstable on his feet, however falls should not deter one from indefinite anticoagulation.  Given these findings I would f/u in  3 months time to re-evaluate the course of action moving forward.  He has no history of prior bleeds and is tolerating therapy well so far, therefore my preference would be for indefinite anticoagulation, but if the patient feels the medication is too expensive or if the bleeding risk is too high then d/c therapy would be a reasonable option.  The patient voices understanding of this plan moving forward.  # Unprovoked Acute Right Lower Extremity DVT --completed eliquis  BID PO x 6 months.  Continue maintenance dosing with Eliquis 2.5 mg twice daily indefinitely. --unfortunately given the unprovoked nature of this VTE the recommendation is for lifelong anticoagulation. --no indication for a hypercoagulation workup as it would not change management --labs today show white blood cell count 4.2, hemoglobin 11.0, MCV 95.0, and platelets of 184. --RTC in 6 months time to reassess   No orders of the defined types were placed in this encounter.  All questions were answered. The patient knows to call the clinic with any problems, questions or concerns.  A total of more than 25 minutes were spent on this encounter and over half of that time was spent on counseling and coordination of care as outlined above.   Ulysees Barns, MD Department of Hematology/Oncology [facility] Phone: [phone] Pager: [phone] Email: [email or web address]  08/30/2022 4:37 PM

## 2022-08-30 ENCOUNTER — Other Ambulatory Visit: Payer: Self-pay

## 2022-08-30 ENCOUNTER — Inpatient Hospital Stay: Payer: Medicare Other | Attending: Nurse Practitioner

## 2022-08-30 ENCOUNTER — Telehealth: Payer: Self-pay | Admitting: Physician Assistant

## 2022-08-30 ENCOUNTER — Inpatient Hospital Stay (HOSPITAL_BASED_OUTPATIENT_CLINIC_OR_DEPARTMENT_OTHER): Payer: Medicare Other | Admitting: Hematology and Oncology

## 2022-08-30 VITALS — BP 101/62 | HR 60 | Temp 97.0°F | Resp 13 | Wt 170.6 lb

## 2022-08-30 DIAGNOSIS — Z86718 Personal history of other venous thrombosis and embolism: Secondary | ICD-10-CM | POA: Diagnosis present

## 2022-08-30 DIAGNOSIS — I82411 Acute embolism and thrombosis of right femoral vein: Secondary | ICD-10-CM

## 2022-08-30 DIAGNOSIS — Z7901 Long term (current) use of anticoagulants: Secondary | ICD-10-CM | POA: Diagnosis not present

## 2022-08-30 LAB — CMP (CANCER CENTER ONLY)
ALT: 16 U/L (ref 0–44)
AST: 19 U/L (ref 15–41)
Albumin: 3.5 g/dL (ref 3.5–5.0)
Alkaline Phosphatase: 122 U/L (ref 38–126)
Anion gap: 5 (ref 5–15)
BUN: 17 mg/dL (ref 8–23)
CO2: 26 mmol/L (ref 22–32)
Calcium: 9.4 mg/dL (ref 8.9–10.3)
Chloride: 104 mmol/L (ref 98–111)
Creatinine: 0.66 mg/dL (ref 0.61–1.24)
GFR, Estimated: >60 mL/min (ref >60)
Glucose, Bld: 91 mg/dL (ref 70–99)
Potassium: 4.2 mmol/L (ref 3.5–5.1)
Sodium: 135 mmol/L (ref 135–145)
Total Bilirubin: 0.2 mg/dL — ABNORMAL LOW (ref 0.3–1.2)
Total Protein: 6.9 g/dL (ref 6.5–8.1)

## 2022-08-30 LAB — CBC WITH DIFFERENTIAL (CANCER CENTER ONLY)
Abs Immature Granulocytes: 0.08 10*3/uL — ABNORMAL HIGH (ref 0.00–0.07)
Basophils Absolute: 0.1 10*3/uL (ref 0.0–0.1)
Basophils Relative: 1 %
Eosinophils Absolute: 0.1 10*3/uL (ref 0.0–0.5)
Eosinophils Relative: 3 %
HCT: 32.3 % — ABNORMAL LOW (ref 39.0–52.0)
Hemoglobin: 11 g/dL — ABNORMAL LOW (ref 13.0–17.0)
Immature Granulocytes: 2 %
Lymphocytes Relative: 25 %
Lymphs Abs: 1 10*3/uL (ref 0.7–4.0)
MCH: 32.4 pg (ref 26.0–34.0)
MCHC: 34.1 g/dL (ref 30.0–36.0)
MCV: 95 fL (ref 80.0–100.0)
Monocytes Absolute: 0.6 10*3/uL (ref 0.1–1.0)
Monocytes Relative: 15 %
Neutro Abs: 2.3 10*3/uL (ref 1.7–7.7)
Neutrophils Relative %: 54 %
Platelet Count: 184 10*3/uL (ref 150–400)
RBC: 3.4 MIL/uL — ABNORMAL LOW (ref 4.22–5.81)
RDW: 13.8 % (ref 11.5–15.5)
WBC Count: 4.2 10*3/uL (ref 4.0–10.5)
nRBC: 0 % (ref 0.0–0.2)

## 2022-08-30 NOTE — Telephone Encounter (Signed)
Reached out to schedule patient per 4/18 LOS, left voicemail. Will mail appointment reminders.

## 2022-09-04 LAB — CUP PACEART REMOTE DEVICE CHECK
Battery Remaining Longevity: 55 mo
Battery Remaining Percentage: 95.5 %
Battery Voltage: 2.96 V
Brady Statistic AP VP Percent: 60 %
Brady Statistic AP VS Percent: 1 %
Brady Statistic AS VP Percent: 40 %
Brady Statistic AS VS Percent: 1 %
Brady Statistic RA Percent Paced: 58 %
Brady Statistic RV Percent Paced: 99 %
Date Time Interrogation Session: 20240423040014
Implantable Lead Connection Status: 753985
Implantable Lead Connection Status: 753985
Implantable Lead Implant Date: 20240122
Implantable Lead Implant Date: 20240122
Implantable Lead Location: 753859
Implantable Lead Location: 753860
Implantable Pulse Generator Implant Date: 20240122
Lead Channel Impedance Value: 400 Ohm
Lead Channel Impedance Value: 480 Ohm
Lead Channel Pacing Threshold Amplitude: 1.5 V
Lead Channel Pacing Threshold Amplitude: 1.5 V
Lead Channel Pacing Threshold Pulse Width: 0.5 ms
Lead Channel Pacing Threshold Pulse Width: 0.5 ms
Lead Channel Sensing Intrinsic Amplitude: 12 mV
Lead Channel Sensing Intrinsic Amplitude: 5 mV
Lead Channel Setting Pacing Amplitude: 3.5 V
Lead Channel Setting Pacing Amplitude: 3.5 V
Lead Channel Setting Pacing Pulse Width: 0.5 ms
Lead Channel Setting Sensing Sensitivity: 2 mV
Pulse Gen Model: 2272
Pulse Gen Serial Number: 5681397

## 2022-09-05 ENCOUNTER — Ambulatory Visit (INDEPENDENT_AMBULATORY_CARE_PROVIDER_SITE_OTHER): Payer: Medicare Other

## 2022-09-05 DIAGNOSIS — I442 Atrioventricular block, complete: Secondary | ICD-10-CM | POA: Diagnosis not present

## 2022-09-06 ENCOUNTER — Encounter: Payer: Self-pay | Admitting: Internal Medicine

## 2022-09-06 ENCOUNTER — Ambulatory Visit: Payer: Medicare Other | Attending: Internal Medicine | Admitting: Internal Medicine

## 2022-09-06 VITALS — BP 100/58 | HR 60 | Ht 70.0 in | Wt 164.8 lb

## 2022-09-06 DIAGNOSIS — Z950 Presence of cardiac pacemaker: Secondary | ICD-10-CM | POA: Diagnosis not present

## 2022-09-06 DIAGNOSIS — I442 Atrioventricular block, complete: Secondary | ICD-10-CM

## 2022-09-06 DIAGNOSIS — G459 Transient cerebral ischemic attack, unspecified: Secondary | ICD-10-CM | POA: Diagnosis not present

## 2022-09-06 NOTE — Patient Instructions (Addendum)
Medication Instructions:  Your physician recommends that you continue on your current medications as directed. Please refer to the Current Medication list given to you today.  *If you need a refill on your cardiac medications before your next appointment, please call your pharmacy*  Lab Work: None ordered.  If you have labs (blood work) drawn today and your tests are completely normal, you will receive your results only by: MyChart Message (if you have MyChart) OR A paper copy in the mail If you have any lab test that is abnormal or we need to change your treatment, we will call you to review the results.  Testing/Procedures: None ordered.  Follow-Up: At [facility], you and your health needs are our priority.  As part of our continuing mission to provide you with exceptional heart care, we have created designated Provider Care Teams.  These Care Teams include your primary Cardiologist (physician) and Advanced Practice Providers (APPs -  Physician Assistants and Nurse Practitioners) who all work together to provide you with the care you need, when you need it.  Your next appointment:   1 year(s)  The format for your next appointment:   In Person  Provider:   Lewayne Bunting, MD{or one of the following Advanced Practice Providers on your designated Care Team:   Francis Dowse, [place] Casimiro Needle "Mardelle Matte" Lanna Poche, [place]  Remote monitoring is used to monitor your Pacemaker from home. This monitoring reduces the number of office visits required to check your device to one time per year. It allows [place] to keep an eye on the functioning of your device to ensure it is working properly. You are scheduled for a device check from home on 12/05/22. You may send your transmission at any time that day. If you have a wireless device, the transmission will be sent automatically. After your physician reviews your transmission, you will receive a postcard with your next transmission date.     ]

## 2022-09-06 NOTE — Progress Notes (Signed)
HPI Mr. Sones returns today for followup. He is a pleasant 89 yo man with a h/o CHB s/p PPM insertion as well as DVT, on eliquis. He denies chest pain, sob, or edema. No syncope. He has no pain over his PM insertion site.   Allergies  Allergen Reactions   Tape Other (See Comments)    Skin tearing      Current Outpatient Medications  Medication Sig Dispense Refill   ALPRAZolam (XANAX) 0.5 MG tablet Take 0.5 mg by mouth at bedtime as needed.     apixaban (ELIQUIS) 2.5 MG TABS tablet Take 1 tablet (2.5 mg total) by mouth 2 (two) times daily. Resume from 06/10/22 60 tablet 0   ARTIFICIAL TEAR OP Place 1 drop into both eyes daily as needed (dry eye).     cholecalciferol (VITAMIN D) 1000 UNITS tablet Take 2,000 Units by mouth daily.      desoximetasone (TOPICORT) 0.25 % cream Apply 1 Application topically at bedtime.     divalproex (DEPAKOTE ER) 250 MG 24 hr tablet Take 3 tablets (750 mg total) by mouth daily. 90 tablet 0   Ferrous Sulfate (IRON PO) Take 1 tablet by mouth daily.     folic acid (FOLVITE) 1 MG tablet Take 1 tablet (1 mg total) by mouth daily. 30 tablet 0   furosemide (LASIX) 40 MG tablet Take 40 mg by mouth daily.     lactulose (CHRONULAC) 10 GM/15ML solution Take 15 mLs (10 g total) by mouth 2 (two) times daily. 236 mL 0   mometasone (ELOCON) 0.1 % ointment Apply 1 application topically 2 (two) times daily as needed (rash).      Multiple Vitamin (MULTIVITAMIN) tablet Take 1 tablet by mouth daily.     mupirocin ointment (BACTROBAN) 2 % Apply 1 Application topically 2 (two) times daily.     omeprazole (PRILOSEC) 20 MG capsule Take 20 mg by mouth daily.     tamsulosin (FLOMAX) 0.4 MG CAPS capsule TAKE 1 CAPSULE (0.4 MG TOTAL) BY MOUTH DAILY AFTER SUPPER. (Patient taking differently: Take 0.4 mg by mouth daily.) 30 capsule 5   topiramate (TOPAMAX) 50 MG tablet Take 1 tablet (50 mg total) by mouth at bedtime. 90 tablet 3   traMADol (ULTRAM) 50 MG tablet Take 1 tablet (50  mg total) by mouth every 6 (six) hours as needed for moderate pain. 10 tablet 0   traZODone (DESYREL) 50 MG tablet Take 50 mg by mouth at bedtime.     No current facility-administered medications for this visit.     Past Medical History:  Diagnosis Date   Bradycardia 06/02/2022   Colon polyps    Diverticulosis    Encephalopathy acute 06/02/2022   Hypothermia 06/02/2022   Prostate cancer 2015   radiation finished Nov 2015   Seizures 07/2016   Sepsis 06/02/2022   Unresponsiveness 07/28/2016    ROS:   All systems reviewed and negative except as noted in the HPI.   Past Surgical History:  Procedure Laterality Date   colonscopy     INGUINAL HERNIA REPAIR     KNEE SURGERY     PACEMAKER IMPLANT N/A 06/04/2022   Procedure: PACEMAKER IMPLANT;  Surgeon: Marinus Maw, MD;  [facility]: [facility] INV[facility];  Service: Cardiovascular;  Laterality: N/A;   PROSTATE BIOPSY     TEMPORARY PACEMAKER N/A 06/03/2022   Procedure: TEMPORARY PACEMAKER;  Surgeon: Dolores Patty, MD;  Location: [facility] INVASIVE CV LAB;  Service: Cardiovascular;  Laterality: N/A;  Family History  Problem Relation Age of Onset   Alcoholism Father    Cancer Neg Hx    Colon cancer Neg Hx      Social History   Socioeconomic History   Marital status: Married    Spouse name: Not on file   Number of children: Not on file   Years of education: Not on file   Highest education level: Not on file  Occupational History   Occupation: civil Art gallery manager  Tobacco Use   Smoking status: Never   Smokeless tobacco: Never  Substance and Sexual Activity   Alcohol use: Yes    Alcohol/week: 7.0 standard drinks of alcohol    Types: 7 Glasses of wine per week    Comment: 4 oz nightly   Drug use: No   Sexual activity: Yes  Other Topics Concern   Not on file  Social History Narrative   Lives with wife at [facility] in Independent Living.  Uses cane.     Social Determinants of Health   Financial Resource Strain:  Not on file  Food Insecurity: No Food Insecurity (08/02/2022)   Hunger Vital Sign    Worried About Running Out of Food in the Last Year: Never true    Ran Out of Food in the Last Year: Never true  Transportation Needs: No Transportation Needs (08/02/2022)   PRAPARE - Administrator, Civil Service (Medical): No    Lack of Transportation (Non-Medical): No  Physical Activity: Not on file  Stress: Not on file  Social Connections: Not on file  Intimate Partner Violence: Not At Risk (08/02/2022)   Humiliation, Afraid, Rape, and Kick questionnaire    Fear of Current or Ex-Partner: No    Emotionally Abused: No    Physically Abused: No    Sexually Abused: No     BP (!) 100/58   Pulse 60   Ht  (1.778 m)   Wt 164 lb 12.8 oz (74.8 kg)   SpO2 94%   BMI 23.65 kg/m   Physical Exam:  Well appearing NAD HEENT: Unremarkable Neck:  No JVD, no thyromegally Lymphatics:  No adenopathy Back:  No CVA tenderness Lungs:  Clear with no wheezes HEART:  Regular rate rhythm, no murmurs, no rubs, no clicks Abd:  soft, positive bowel sounds, no organomegally, no rebound, no guarding Ext:  2 plus pulses, no edema, no cyanosis, no clubbing Skin:  No rashes no nodules Neuro:  CN II through XII intact, motor grossly intact  EKG - nsr with ventricular pacing  DEVICE  Normal device function.  See PaceArt for details.   Assess/Plan: CHB - he is asymptomatic s/p PPM insertion. PPM -his [employer] DDD PM is working normally. We will recheck in several months.  Sharlot Gowda Sierah Lacewell,MD

## 2022-09-17 ENCOUNTER — Encounter: Payer: Self-pay | Admitting: Surgery

## 2022-09-28 ENCOUNTER — Ambulatory Visit: Payer: Medicare Other | Attending: Cardiology | Admitting: Cardiology

## 2022-09-28 ENCOUNTER — Encounter: Payer: Self-pay | Admitting: Cardiology

## 2022-09-28 VITALS — BP 104/72 | HR 62 | Ht 70.0 in | Wt 173.8 lb

## 2022-09-28 DIAGNOSIS — Z86711 Personal history of pulmonary embolism: Secondary | ICD-10-CM | POA: Diagnosis not present

## 2022-09-28 DIAGNOSIS — Z950 Presence of cardiac pacemaker: Secondary | ICD-10-CM | POA: Diagnosis not present

## 2022-09-28 NOTE — Progress Notes (Signed)
Primary Care Provider: Charlane Ferretti, DO [facility] Cardiologist: Bryan Lemma, MD Electrophysiologist: Lewayne Bunting, MD  Clinic Note: Chief Complaint  Patient presents with   Establish Care    Has been seen by EP for l post pacemaker    ===================================  ASSESSMENT/PLAN   89 year old gentleman who is now status post pacemaker placement for complete heart block-syncope.  Doing well with pacemaker.  He follows up with Dr. Ladona Ridgel.  No other active cardiac symptoms.  Will continue to see him on an annual basis.  Problem List Items Addressed This Visit     Pacemaker - Primary (Chronic)    Doing well.  No further episodes of syncope since pacemaker.  He has midodrine fatigue.  This probably related to iron deficiency anemia.  Could potentially consider increasing rate responsiveness.  I think he does not want but is hands very much because of Parkinson's that may decrease the rate responsiveness.      History of pulmonary embolism (Chronic)    Remains on Eliquis.  Managed by PCP.  I think at this point he is on low-dose for maintenance.  => Defer all questions as far as timing and duration of holding this medication to his PCP.       ===================================  HPI:    Kenneth Bowen is a 89 y.o. male with history of CHB-s/p PPM, Parkinsons's-like Sx, prostate cancer, seizure disorder, DVT on Eliquis who is being seen today ostensibly to establish primary cardiology care.  He presents at the request of Charlane Ferretti, DO.  Minta Balsam Tickner was just seen by Dr. Lewayne Bunting from EP on 09/06/2022.  Asymptomatic following his [facility] Jude DDD PPM insertion.  Recent Hospitalizations:  Admission 1/20-27/2024: Presented from assisted living after syncope.  Was very bradycardic upon arrival, required atropine.  Was hypothermic and heart rate in 40s.  Admitted to the ICU under PCCM service treated with warmed IV and Bair hugger as well as  antibiotics.  Code stroke called because of unresponsiveness.  Head CT did not show any acute process.  CTA negative for stenoses.  EEG normal.  Subsequently suffered bradycardic arrest requiring CPR-placed on dopamine drip.  (Bradycardic arrest x 3) TPM placed on 06/03/2022-PPM delayed due to concern for possible sepsis PPM placed on June 04, 2022 Had a seizure on January 25 and neurology consulted.  Depakote dose increased. Discharged to SNF as DNR  3/21-26/24: fall, T12 fxr  Reviewed  CV studies:    The following studies were reviewed today: (if available, images/films reviewed: From Epic Chart or Care Everywhere) 06/03/2022: TPM placed by Dr. Gala Romney 1/22/2024[place] Jude DDD PPM placed by Dr. Ladona Ridgel  TTE 08/04/2021: EF 60 to 65%.  No RWMA.  Unable to assess PAP but RV appears to be slightly enlarged.  Moderate aortic valve calcification with no stenosis.  Normal mitral valve.  Normal RAP.   Interval History:   Kenneth Bowen presents here today to establish primary cardiology care.  He was just seen by Dr. Ladona Ridgel and doing well.  He still has mild chronic swelling but no orthopnea or PND.  He does wear compression stockings which seems to help.  Lasix does not seem to be a little effective.  He is only taking 20 mg now. .  No further syncopal episodes.  The fall that he had in March was actually 1 of 3 falls over several days.  He was not sure what the true etiology of it was but it was mostly  an unstable gait.  He is in working with physical therapy and.  He does need some help with ADLs at this provide hoping that he can get fully functional again.  Home Health Assistant comes into to some of the chores and help him with activity.  He has been bothered by anemia and is noting that he is tired at the end of the day.  He is back on iron now.  He was given a diagnosis of congestive heart failure, however he really does not have any symptoms besides fatigue and some edema.  No other  symptoms PND or orthopnea.  No real exertional dyspnea.  CV Review of Symptoms (Summary):  positive for - dyspnea on exertion, edema, and continues to be somewhat fatigued. negative for - chest pain, edema, loss of consciousness, palpitations, or paroxysmal nocturnal dyspnea  REVIEWED OF SYSTEMS   Review of Systems  Constitutional:  Positive for malaise/fatigue (Tired at the end of the day.  Doing better back on iron.).  HENT:  Positive for sinus pain.   Respiratory:  Positive for shortness of breath (No real change in baseline).   Cardiovascular:  Positive for leg swelling.  Gastrointestinal:  Negative for blood in stool and melena.  Genitourinary:  Negative for hematuria.  Musculoskeletal:  Positive for falls (Has not fallen since recent ER visit.), joint pain and myalgias.  Neurological:  Positive for dizziness. Negative for focal weakness.  Endo/Heme/Allergies:  Bruises/bleeds easily.  Psychiatric/Behavioral:  Positive for depression. The patient is not nervous/anxious and does not have insomnia.     At least 2 times a night nocturia  I have reviewed and (if needed) personally updated the patient's problem list, medications, allergies, past medical and surgical history, social and family history.   PAST MEDICAL HISTORY   Past Medical History:  Diagnosis Date   Bradycardia 06/02/2022   Symptomatic bradycardia now status post pacemaker after syncope.   Colon polyps    Diverticulosis    Encephalopathy acute 06/02/2022   Hypothermia 06/02/2022   Pacemaker 05/2022   Due to complete heart block   Prostate cancer [facility]) 2015   radiation finished Nov 2015   Seizures (HCC) 07/2016   Sepsis (HCC) 06/02/2022   Unresponsiveness 07/28/2016    PAST SURGICAL HISTORY   Past Surgical History:  Procedure Laterality Date   colonscopy     INGUINAL HERNIA REPAIR     KNEE SURGERY     PACEMAKER IMPLANT N/A 06/04/2022   Procedure: PACEMAKER IMPLANT;  Surgeon: Marinus Maw, MD;   Location: [facility] INVASIVE CV LAB;  Service: Cardiovascular;  Laterality: N/A;   PROSTATE BIOPSY     TEMPORARY PACEMAKER N/A 06/03/2022   Procedure: TEMPORARY PACEMAKER;  Surgeon: Dolores Patty, MD;  Location: [facility] INVASIVE CV LAB;  Service: Cardiovascular;  Laterality: N/A;    MEDICATIONS/ALLERGIES   Current Meds  Medication Sig   ALPRAZolam (XANAX) 0.5 MG tablet Take 0.5 mg by mouth at bedtime as needed.   apixaban (ELIQUIS) 2.5 MG TABS tablet Take 1 tablet (2.5 mg total) by mouth 2 (two) times daily. Resume from 06/10/22   ARTIFICIAL TEAR OP Place 1 drop into both eyes daily as needed (dry eye).   cholecalciferol (VITAMIN D) 1000 UNITS tablet Take 2,000 Units by mouth daily.    divalproex (DEPAKOTE ER) 250 MG 24 hr tablet Take 3 tablets (750 mg total) by mouth daily.   Ferrous Sulfate (IRON PO) Take 1 tablet by mouth daily.   folic acid (FOLVITE) 1 MG tablet  Take 1 tablet (1 mg total) by mouth daily.   furosemide (LASIX) 40 MG tablet Take 40 mg by mouth daily.   lactulose (CHRONULAC) 10 GM/15ML solution Take 15 mLs (10 g total) by mouth 2 (two) times daily.   mometasone (ELOCON) 0.1 % ointment Apply 1 application topically 2 (two) times daily as needed (rash).    Multiple Vitamin (MULTIVITAMIN) tablet Take 1 tablet by mouth daily.   mupirocin ointment (BACTROBAN) 2 % Apply 1 Application topically 2 (two) times daily.   omeprazole (PRILOSEC) 20 MG capsule Take 20 mg by mouth daily.   tamsulosin (FLOMAX) 0.4 MG CAPS capsule TAKE 1 CAPSULE (0.4 MG TOTAL) BY MOUTH DAILY AFTER SUPPER. (Patient taking differently: Take 0.4 mg by mouth daily.)   topiramate (TOPAMAX) 50 MG tablet Take 1 tablet (50 mg total) by mouth at bedtime.   traMADol (ULTRAM) 50 MG tablet Take 1 tablet (50 mg total) by mouth every 6 (six) hours as needed for moderate pain.   traZODone (DESYREL) 50 MG tablet Take 50 mg by mouth at bedtime.    Allergies  Allergen Reactions   Tape Other (See Comments)    Skin tearing      SOCIAL HISTORY/FAMILY HISTORY   Reviewed in Epic:   Social History   Tobacco Use   Smoking status: Never   Smokeless tobacco: Never  Substance Use Topics   Alcohol use: Yes    Alcohol/week: 7.0 standard drinks of alcohol    Types: 7 Glasses of wine per week    Comment: 4 oz nightly   Drug use: No   Social History   Social History Narrative   Lives with wife at [facility] in Independent Living.  Uses cane.     Family History  Problem Relation Age of Onset   Alcoholism Father    Cancer Neg Hx    Colon cancer Neg Hx     OBJCTIVE -PE, EKG, labs   Wt Readings from Last 3 Encounters:  09/28/22 173 lb 12.8 oz (78.8 kg)  09/06/22 164 lb 12.8 oz (74.8 kg)  08/30/22 170 lb 9.6 oz (77.4 kg)    Physical Exam: BP 104/72   Pulse 62   Ht 5\' 10"  (1.778 m)   Wt 173 lb 12.8 oz (78.8 kg)   SpO2 97%   BMI 24.94 kg/m  Physical Exam Constitutional:      General: He is not in acute distress.    Appearance: Normal appearance. He is obese. He is not ill-appearing or toxic-appearing.  HENT:     Head: Normocephalic and atraumatic.  Neck:     Vascular: No carotid bruit.  Cardiovascular:     Rate and Rhythm: Normal rate and regular rhythm. No extrasystoles are present.    Chest Wall: PMI is not displaced.     Pulses: Decreased pulses.     Heart sounds: S1 normal. Heart sounds are distant. Murmur (Soft systolic murmur.) heard.     Comments: Normal S1 with split S2 from pacemaker Pulmonary:     Effort: Pulmonary effort is normal. No respiratory distress.     Breath sounds: Normal breath sounds. No wheezing or rales.  Chest:     Chest wall: No tenderness.  Musculoskeletal:        General: Swelling (1+ bilateral) present.     Cervical back: Normal range of motion and neck supple.  Skin:    General: Skin is warm and dry.  Neurological:     General: No focal deficit present.  Mental Status: He is alert and oriented to person, place, and time.     Gait: Gait abnormal.   Psychiatric:        Mood and Affect: Mood normal.        Behavior: Behavior normal.        Judgment: Judgment normal.      Adult ECG Report N/A  Recent Labs: Reviewed Lab Results  Component Value Date   CHOL 114 07/13/2016   HDL 42 07/13/2016   LDLCALC 55 07/13/2016   TRIG 85 07/13/2016   CHOLHDL 2.7 07/13/2016   Lab Results  Component Value Date   CREATININE 0.66 08/30/2022   BUN 17 08/30/2022   NA 135 08/30/2022   K 4.2 08/30/2022   CL 104 08/30/2022   CO2 26 08/30/2022      Latest Ref Rng & Units 08/30/2022    1:21 PM 08/03/2022    2:48 AM 08/02/2022    5:03 AM  CBC  WBC 4.0 - 10.5 K/uL 4.2  4.4    Hemoglobin 13.0 - 17.0 g/dL 40.9  81.1  91.4   Hematocrit 39.0 - 52.0 % 32.3  30.5  34.0   Platelets 150 - 400 K/uL 184  150      Lab Results  Component Value Date   HGBA1C 5.8 (H) 07/13/2016   Lab Results  Component Value Date   TSH 6.783 (H) 06/02/2022    ================================================== I spent a total of 30  minutes with the patient spent in direct patient consultation.  => Long discussion about his health maintenance and the pacemaker etc. Additional time spent with chart review  / charting (studies, outside notes, etc): 18 min Total Time: 48 min  Current medicines are reviewed at length with the patient today.  (+/- concerns) N/A  Notice: This dictation was prepared with Dragon dictation along with smart phrase technology. Any transcriptional errors that result from this process are unintentional and may not be corrected upon review.   Studies Ordered:  No orders of the defined types were placed in this encounter.  No orders of the defined types were placed in this encounter.   Patient Instructions / Medication Changes & Studies & Tests Ordered   Patient Instructions  Medication Instructions:   No changes  *If you need a refill on your cardiac medications before your next appointment, please call your pharmacy*   Lab  Work:  Not needed  Testing/Procedures:  Not needed  Follow-Up: At [facility], you and your health needs are our priority.  As part of our continuing mission to provide you with exceptional heart care, we have created designated Provider Care Teams.  These Care Teams include your primary Cardiologist (physician) and Advanced Practice Providers (APPs -  Physician Assistants and Nurse Practitioners) who all work together to provide you with the care you need, when you need it.     Your next appointment:   12 month(s)  The format for your next appointment:   In Person  Provider:   Bryan Lemma, MD    Other Instructions     Marykay Lex, MD, MS Bryan Lemma, M.D., M.S. Interventional Cardiologist  [facility]  Pager # [phone] Phone # [phone] [address] [place], [place] [address]   Thank you for choosing [facility] [place]!!

## 2022-09-28 NOTE — Patient Instructions (Addendum)

## 2022-10-02 NOTE — Progress Notes (Signed)
Remote pacemaker transmission.   

## 2022-10-13 ENCOUNTER — Encounter: Payer: Self-pay | Admitting: Cardiology

## 2022-10-13 DIAGNOSIS — Z86711 Personal history of pulmonary embolism: Secondary | ICD-10-CM | POA: Insufficient documentation

## 2022-10-13 NOTE — Assessment & Plan Note (Signed)
Remains on Eliquis.  Managed by PCP.  I think at this point he is on low-dose for maintenance.  => Defer all questions as far as timing and duration of holding this medication to his PCP.

## 2022-10-13 NOTE — Assessment & Plan Note (Signed)
Doing well.  No further episodes of syncope since pacemaker.  He has midodrine fatigue.  This probably related to iron deficiency anemia.  Could potentially consider increasing rate responsiveness.  I think he does not want but is hands very much because of Parkinson's that may decrease the rate responsiveness.

## 2022-11-23 ENCOUNTER — Other Ambulatory Visit: Payer: Self-pay | Admitting: Adult Health

## 2022-12-05 ENCOUNTER — Ambulatory Visit (INDEPENDENT_AMBULATORY_CARE_PROVIDER_SITE_OTHER): Payer: Medicare Other

## 2022-12-05 DIAGNOSIS — I442 Atrioventricular block, complete: Secondary | ICD-10-CM

## 2022-12-06 LAB — CUP PACEART REMOTE DEVICE CHECK
Battery Remaining Longevity: 92 mo
Battery Remaining Percentage: 95.5 %
Battery Voltage: 2.98 V
Brady Statistic AP VP Percent: 71 %
Brady Statistic AP VS Percent: 1 %
Brady Statistic AS VP Percent: 29 %
Brady Statistic AS VS Percent: 1 %
Brady Statistic RA Percent Paced: 69 %
Brady Statistic RV Percent Paced: 99 %
Date Time Interrogation Session: 20240724020022
Implantable Lead Connection Status: 753985
Implantable Lead Connection Status: 753985
Implantable Lead Implant Date: 20240122
Implantable Lead Implant Date: 20240122
Implantable Lead Location: 753859
Implantable Lead Location: 753860
Lead Channel Impedance Value: 380 Ohm
Lead Channel Impedance Value: 450 Ohm
Lead Channel Pacing Threshold Amplitude: 1 V
Lead Channel Pacing Threshold Amplitude: 1 V
Lead Channel Pacing Threshold Pulse Width: 0.5 ms
Lead Channel Pacing Threshold Pulse Width: 0.5 ms
Lead Channel Sensing Intrinsic Amplitude: 12 mV
Lead Channel Setting Pacing Amplitude: 2 V
Lead Channel Setting Pacing Amplitude: 2.5 V
Lead Channel Setting Pacing Pulse Width: 0.5 ms
Lead Channel Setting Sensing Sensitivity: 2 mV

## 2022-12-18 NOTE — Progress Notes (Signed)
Remote pacemaker transmission.   

## 2022-12-20 ENCOUNTER — Other Ambulatory Visit: Payer: Self-pay | Admitting: Adult Health

## 2022-12-27 ENCOUNTER — Ambulatory Visit (INDEPENDENT_AMBULATORY_CARE_PROVIDER_SITE_OTHER): Payer: Medicare Other | Admitting: Adult Health

## 2022-12-27 ENCOUNTER — Encounter: Payer: Self-pay | Admitting: Adult Health

## 2022-12-27 VITALS — BP 124/70 | HR 72 | Ht 66.0 in | Wt 165.0 lb

## 2022-12-27 DIAGNOSIS — R29818 Other symptoms and signs involving the nervous system: Secondary | ICD-10-CM | POA: Diagnosis not present

## 2022-12-27 DIAGNOSIS — G2581 Restless legs syndrome: Secondary | ICD-10-CM | POA: Diagnosis not present

## 2022-12-27 DIAGNOSIS — G40209 Localization-related (focal) (partial) symptomatic epilepsy and epileptic syndromes with complex partial seizures, not intractable, without status epilepticus: Secondary | ICD-10-CM

## 2022-12-27 MED ORDER — DIVALPROEX SODIUM ER 250 MG PO TB24: 750.0000 mg | ORAL_TABLET | Freq: Every day | ORAL | 11 refills | Status: DC

## 2022-12-27 MED ORDER — TOPIRAMATE 50 MG PO TABS: 50.0000 mg | ORAL_TABLET | Freq: Every day | ORAL | 11 refills | Status: DC

## 2022-12-27 NOTE — Progress Notes (Signed)
[facility] [address] [place]. [place] [address] [phone]       OFFICE FOLLOW-UP NOTE  Kenneth. Kenneth Bowen Date of Birth:  [birth year] Medical Record Number:  [id number]    Primary neurologist: Kenneth Bowen  Chief Complaint  Patient presents with   Follow-up    Rm 8, here son Kenneth Bowen Pt is here for seizure follow up. Pt states he has a pacemaker now. Pt has fallen several times. Pt states he had one seizure in January after getting his pacemaker. States he had a seizure that lasted 4 minutes  a week ago as well.       HPI:    Initial visit 02/10/2018 Kenneth Bowen: Kenneth Bowen is a 66 year Caucasian male seen today for first office follow-up visit for in-hospital with admission in March 2018 for episode of brief loss of awareness. History is obtained from the patient, review of electronic medical records and have personally reviewed imaging for films. Kenneth Bowen is an 89 y.o. male who was  at home earlier and wife saw him standing up against the wall unresponsive, staring and had a bowel movement.    He was able to share what his wife described as another similar episodes  Two weeks ago, patient was leaning over to pick up something and says he may have strained to reach down to get the item.  He says his head was "all the way down."  When he sat up, he apparently became dizzy and had blurred vision.  He then became confused and his wife told him that for 20 minutes he "reached for things that were not there and acted confused."  This lasted for 20 minutes and he returned to normal by the time EMS arrived.  The second event occurred prior to admission.  Patient was sitting upright and did not have a positional change or strain to reach for something.  He had sudden onset of dizziness.  Like the time two weeks before, his wife assisted him to another room but he was confused again.  This time the patient did not "return to clear understanding" until he was in the  ambulance.  This was approximately 30-40 minutes after onset.  When asked if this has happened before, patient recalled an event 10 years prior when he suddenly lost his words for a few minutes and an event 15 years prior when he had trouble understanding the words he was reading.  However, no confusion spells of note. CT scan of the brain on admission was unremarkable and CT angiogram showed no large vessel stenosis or occlusion. EEG was obtained which showed no definite seizure activity. Transthoracic echo showed normal ejection fraction. LDL cholesterol was 55 mg percent. Hemoglobin A1c was 5.8. Patient was started on a trial of Depakote ER 500 mg daily. He states he tolerated well but he ran out of a week ago has not filled it yet. He complained of increased frequency of soft stools about 3 times a day he felt this may be related to Depakote. He has had no further recurrent episodes. Denies any prior history of significant head injury with loss of consciousness, childhood epilepsy or other seizures. Update 04/28/2018 : He returns for follow-up after last visit 2-1/2 months ago.  He had unexplained fall backwards when he was getting up quickly.  He saw his primary physician Dr. Chilton Bowen who recommended he see me for further evaluation.  He states that he has had several such falls  following his initial fall in 2017 when he fell and sustained compression fracture of 2 vertebrae in the lumbar spine.  Since then he is learned to get up slowly but most of his falls have been backwards.  He denies any passing out or severe pain leading to the fall.  Patient was seen last week in our office in September 2019 at that time he had comprehensive metabolic panel as well as CBC drawn which were unremarkable.  He remains on Depakote ER 500 daily and has had no further episodes of seizure-like events.  He is been bothered by swelling in his feet and Dr. Chilton Bowen feels this may be related to Depakote.  He has been started on Lasix  20 mg but so far has not shown significant improvement.  He denies any weight gain, tiredness, tremors or dizziness.  Patient denies any leg pain, tingling and numbness.  He does have a feeling of restlessness particularly at night and had trouble sleeping but since he has been taking Xanax 1 mg at night as well as trazodone 50 mg the symptoms are under control.  He has not tried other medications for restless legs like Topamax, gabapentin, Requip or Lyrica.  He denies any tremors in his hands, drooling of saliva, ,bradykinesia, festination no stooped posture.  He still has chronic back pain and his walking is affected by that and he has to favor his back while getting up and sitting down.  He denies radicular pain or numbness. Update 02/16/2019 : He returns for follow-up after last visit 10 months ago.  He states is doing well.  Continues to have gait and balance difficulties with tendency to fall backwards.  He is at only one fall as he is quite careful with no major injuries.  Did undergo MRI scan of the brain on 05/12/2018 which I personally reviewed shows changes of generalized cerebral atrophy as well as changes of chronic microvascular disease.  No significant change compared with previous MRI from 07/18/2016.  The patient states he is tolerating Topamax 50 mg at bedtime quite well and seems to have helped his restless leg syndrome.  He still however needs to take Xanax as it helps him sleep.  He denies significant tremor or bradykinesia with drooling of saliva.  He feels overall his gait balance and difficulty is about unchanged  Update 09/03/2019: He returns for follow-up after last visit 6 months ago.  He continues to have frequent falls and has had compression fractures of his spine.  He saw Dr. Chanetta Marshall from [facility] who did some x-rays and referred him to Dr. Ethelene Hal who did MRI scan lumbar spine on 07/16/2019 which shows acute L5 compression fracture as well as multilevel chronic degenerative changes  and compression fractures at T11 and L3 with moderate to severe right L4-5 neural foraminal stenosis and moderate to severe left L3-L4 foraminal stenosis.  Patient has been referred to physical therapy and states he has benefited and is able to pull his shoulders back and look up better and walk with his feet more apart.  He states he has been using a wheeled walker and has not had any falls in the last 3 weeks.  He does not have any resting or action tremor or rigidity.  He states his restless legs are well controlled on the current dose of Topamax at night.  He is also had no episodes of altered awareness or seizure-like activity since he started taking Depakote several years ago.  He has no  new complaints.  Update 07/04/2020 JM: Kenneth. Schwinghammer is being seen for routine follow-up previously seen approximately 10 months ago by Kenneth Bowen.  He has remained on Depakote tolerating without side effects and denies any episodes of altered awareness or seizure-like activity.  He remains on topiramate for adequate control of RLS tolerating without side effects.  Continues to have gait difficulty with use of RW and 1 fall in the beginning of February thankfully without injury.  He has been doing exercises at his ILF for patients with Parkinson's disease which has been improving overall gait.  He denies any resting or action tremor or drooling and mild bradykinesia stable without worsening.  Recently diagnosed with acute RLE DVT in femoral, popliteal and tibial arteries on 2/9.  Eliquis initiated by PCP which she has remained on without bleeding or bruising.  No further concerns at this time.  Update 08/22/2021 JM: Patient returns for follow-up visit after prior visit over 1 year ago unaccompanied.  Overall stable from neurological standpoint since prior visit.  Remains on Depakote without side effects and no additional seizure-like activity.  Remains on topiramate without side effects with adequate control of RLS. He notes  continued gait impairment and unsteadiness which has been stable since prior visit, continued use of rollator walker, denies any recent falls.  He continues to participate in parkinson's class at [facility]. Denies any action or resting tremor, freezing gait or cognitive changes. He does note occasional drooling but denies any significant swallowing difficulties (only when eats too quickly to tries to eat too much at once). He questions further evaluation with imaging for assess for parkinsons disease or potential need for parkinsons medication. Chronic RLE pain with limited movement post fall in 2017, notes improvement since prior visit doing routine regular exercises. Notes bilateral knee pain which can cause some stiffness and inability to completely straighten at times, reports knees are sensitive to touch, he has never been seen by ortho. No further concerns at this time.      Update 12/27/2022 JM: Returns for follow-up visit accompanied by his son.    Episode of syncope on 1/20, evaluated in ED, upon arrival, bradycardic requiring atropine and hypothermic and confusion.  Code stroke called as patient became unresponsive with fixed pupils, CTh and CTA unremarkable.  EEG normal.  Subsequently had bradycardic arrest requiring CPR and underwent permanent pacemaker placement on 1/22.  Patient had a seizure on 1/25, Depakote dosage increased from 500mg  to 750mg  nightly.  Discharged to SNF.  After returning home from rehab, fell in kitchen, refractured previously fractured T12, back brace for 3 months, just recently cleared to remove, no falls since then. Use of rollator walker. Doing PT at [facility], plans on restarting parkinson's class once able.   Despite the above, he believes gait has been stable without worsening.  Reports recurrent seizure about 1 week ago, typical type seizure, was unable to vocalize words, after 1-2 min able to speak, didn't lose consciousness, aware during event. Reports taking  Depakote ER 250mg  daily (although prescribed to take 3x 250mg  capsules daily, he was not aware of this).   Remains on topiramate for RLS, symptoms stable.        ROS:   14 system review of systems is positive for those listed in HPI only all other systems negative  PMH:  Past Medical History:  Diagnosis Date   Bradycardia 06/02/2022   Symptomatic bradycardia now status post pacemaker after syncope.   Colon polyps    Diverticulosis  Encephalopathy acute 06/02/2022   Hypothermia 06/02/2022   Pacemaker 05/2022   Due to complete heart block   Prostate cancer [facility]) 2015   radiation finished Nov 2015   Seizures (HCC) 07/2016   Sepsis (HCC) 06/02/2022   Unresponsiveness 07/28/2016    Social History:  Social History   Socioeconomic History   Marital status: Married    Spouse name: Not on file   Number of children: Not on file   Years of education: Not on file   Highest education level: Not on file  Occupational History   Occupation: civil Art gallery manager  Tobacco Use   Smoking status: Never   Smokeless tobacco: Never  Substance and Sexual Activity   Alcohol use: Yes    Alcohol/week: 7.0 standard drinks of alcohol    Types: 7 Glasses of wine per week    Comment: 4 oz nightly   Drug use: No   Sexual activity: Yes  Other Topics Concern   Not on file  Social History Narrative   Lives with wife at [facility].  Uses cane.     Social Determinants of Health   Financial Resource Strain: Not on file  Food Insecurity: No Food Insecurity (08/02/2022)   Hunger Vital Sign    Worried About Running Out of Food in the Last Year: Never true    Ran Out of Food in the Last Year: Never true  Transportation Needs: No Transportation Needs (08/02/2022)   PRAPARE - Administrator, Civil Service (Medical): No    Lack of Transportation (Non-Medical): No  Physical Activity: Not on file  Stress: Not on file  Social Connections: Not on file  Intimate  Partner Violence: Not At Risk (08/02/2022)   Humiliation, Afraid, Rape, and Kick questionnaire    Fear of Current or Ex-Partner: No    Emotionally Abused: No    Physically Abused: No    Sexually Abused: No    Medications:   Current Outpatient Medications on File Prior to Visit  Medication Sig Dispense Refill   ALPRAZolam (XANAX) 0.5 MG tablet Take 0.5 mg by mouth at bedtime as needed.     apixaban (ELIQUIS) 2.5 MG TABS tablet Take 1 tablet (2.5 mg total) by mouth 2 (two) times daily. Resume from 06/10/22 60 tablet 0   ARTIFICIAL TEAR OP Place 1 drop into both eyes daily as needed (dry eye).     cholecalciferol (VITAMIN D) 1000 UNITS tablet Take 2,000 Units by mouth daily.      desoximetasone (TOPICORT) 0.25 % cream Apply 1 Application topically at bedtime.     divalproex (DEPAKOTE ER) 250 MG 24 hr tablet Take 3 tablets (750 mg total) by mouth daily. 90 tablet 0   Ferrous Sulfate (IRON PO) Take 1 tablet by mouth daily.     folic acid (FOLVITE) 1 MG tablet Take 1 tablet (1 mg total) by mouth daily. 30 tablet 0   furosemide (LASIX) 40 MG tablet Take 40 mg by mouth daily.     lactulose (CHRONULAC) 10 GM/15ML solution Take 15 mLs (10 g total) by mouth 2 (two) times daily. 236 mL 0   mometasone (ELOCON) 0.1 % ointment Apply 1 application topically 2 (two) times daily as needed (rash).      Multiple Vitamin (MULTIVITAMIN) tablet Take 1 tablet by mouth daily.     mupirocin ointment (BACTROBAN) 2 % Apply 1 Application topically 2 (two) times daily.     omeprazole (PRILOSEC) 20 MG capsule Take 20 mg  by mouth daily.     tamsulosin (FLOMAX) 0.4 MG CAPS capsule TAKE 1 CAPSULE (0.4 MG TOTAL) BY MOUTH DAILY AFTER SUPPER. (Patient taking differently: Take 0.4 mg by mouth daily.) 30 capsule 5   topiramate (TOPAMAX) 50 MG tablet TAKE 1 TABLET BY MOUTH EVERYDAY AT BEDTIME 30 tablet 0   traMADol (ULTRAM) 50 MG tablet Take 1 tablet (50 mg total) by mouth every 6 (six) hours as needed for moderate pain. 10 tablet  0   traZODone (DESYREL) 50 MG tablet Take 50 mg by mouth at bedtime.     No current facility-administered medications on file prior to visit.    Allergies:   Allergies  Allergen Reactions   Tape Other (See Comments)    Skin tearing     Physical Exam Today's Vitals   12/27/22 1256  BP: 124/70  Pulse: 72  Weight: 165 lb (74.8 kg)  Height: 5\' 6"  (1.676 m)    Body mass index is 26.63 kg/m.   General: Frail elderly Caucasian male, seated, in no evident distress Head: head normocephalic and atraumatic.  Neck: supple with no carotid or supraclavicular bruits Cardiovascular: regular rate and rhythm, no murmurs Musculoskeletal: Marked kyphosis Skin:  no rash/petichiae Vascular:  Normal pulses all extremities  Neurologic Exam Mental Status: Awake and fully alert.  Fluent speech and language.  Oriented to place and time. Recent and remote memory intact. Attention span, concentration and fund of knowledge appropriate. Mood and affect appropriate.  Diminished facial expression.  Positive glabellar tap. Cranial Nerves: Pupils equal, briskly reactive to light. Extraocular movements full without nystagmus. Visual fields full to confrontation. Hearing diminished bilaterally Facial sensation intact. Face, tongue, palate moves normally and symmetrically.  Motor: Normal bulk and tone. Normal strength in all tested extremity muscles.  No evidence of action or resting tremor.  Mild cogwheel rigidity at the left wrist.  Very slight bradykinesia noted Sensory.: intact to touch ,pinprick .position and vibratory sensation.  Coordination: Rapid alternating movements normal in all extremities. Finger-to-nose and heel-to-shin performed accurately bilaterally. Gait and Station: Arises from chair with mild difficulty. Stance is markedly stooped. Gait demonstrates decreased step length and step height with adequate balance and use of rolling walker.  Tandem walk and heel toe not attempted due to safety  concerns Reflexes: 1+ and symmetric. Toes downgoing.       ASSESSMENT/PLAN: 37 year Caucasian male with 2 episodes in a 3 week period of brief altered consciousness possible Complex partial seizures in 2018.  Episodes of falling backwards likely of multifactorial etiology given mild parkinsonian features and degenerative back disease.  He also has restless legs syndrome which is well controlled on Topamax.  Cardiac arrest in 05/2022 requiring pacemaker with recurrent seizure a couple days after implant, depakote dosage increased, reports additional seizure 1 week ago, reports only taking 1 250mg  cap Depakote although prescribed to take 3 250mg  capsules (was not aware of this dosage recommendation)   1.  Complex partial seizures 2.  RLS 3.  Parkinsonian features, gait impairement 4. Bilateral knee pain   1.  Advised to increase Depakote ER to 750mg  nightly as previously recommended after seizure in January- new prescription provided.  Advised to call with any difficulty tolerating.  Advised to call with any recurrent seizure activity  2.  Topiramate 50 mg nightly -refill provided  3.  Gait has been stable, did have a fall back in March but none since that time.  No evidence of tremor on exam.  Mild bradykinesias stable. Continue  to monitor for any progression.  Continued use of RW at all times. Continue PT with plans on restarting Parkinson's exercise classes once able      Follow-up in 6 months or call earlier if needed   CC:  Charlane Ferretti, DO   I spent 40 minutes of face-to-face and non-face-to-face time with patient and son.  This included previsit chart review, lab review, study review, order entry, electronic health record documentation, patient and son education discussion regarding above diagnoses and treatment plan and answered all other questions to patient satisfaction  Ihor Austin, [facility]  [facility] [address] [place], [place]  [address]  Phone [phone] Fax [phone] Note: This document was prepared with digital dictation and possible smart phrase technology. Any transcriptional errors that result from this process are unintentional.

## 2022-12-27 NOTE — Patient Instructions (Addendum)
Your Plan:  Increase Depakote ER to 3 capsules nightly - total of 750mg  nightly for seizure prevention  Please call with any additional seizure activity   Continue topamax 50mg  nightly for restless leg symptoms  Restart parkinsons class once able    Follow up in 6 months with Dr. Pearlean Brownie or call sooner if needed     Thank you for coming to see [place] at [facility]. I hope we have been able to provide you high quality care today.  You may receive a patient satisfaction survey over the next few weeks. We would appreciate your feedback and comments so that we may continue to improve ourselves and the health of our patients.

## 2023-01-24 ENCOUNTER — Other Ambulatory Visit (HOSPITAL_COMMUNITY): Payer: Self-pay | Admitting: *Deleted

## 2023-01-25 ENCOUNTER — Ambulatory Visit (HOSPITAL_COMMUNITY)
Admission: RE | Admit: 2023-01-25 | Discharge: 2023-01-25 | Disposition: A | Discharge status: Home / Self Care | Payer: Medicare Other | Source: Ambulatory Visit | Attending: Internal Medicine | Admitting: Internal Medicine

## 2023-01-25 MED ORDER — DENOSUMAB 60 MG/ML ~~LOC~~ SOSY: 60.0000 mg | PREFILLED_SYRINGE | Freq: Once | SUBCUTANEOUS | Status: DC

## 2023-01-25 MED ORDER — DENOSUMAB 60 MG/ML ~~LOC~~ SOSY
PREFILLED_SYRINGE | SUBCUTANEOUS | Status: AC
  Filled 2023-01-25: qty 1

## 2023-01-25 NOTE — Progress Notes (Signed)
Patient stated he had dental surgery yesterday and in 2 weeks will have permanent implant done. Per Lurena Joiner at Dr. Tim Lair office patient needs to reschedule . Patient instructed to ask dentist when he can get prolia and call us back to schedule.

## 2023-01-31 ENCOUNTER — Ambulatory Visit (HOSPITAL_COMMUNITY)
Admission: RE | Admit: 2023-01-31 | Discharge: 2023-01-31 | Disposition: A | Discharge status: Home / Self Care | Payer: Medicare Other | Source: Ambulatory Visit | Attending: Cardiovascular Disease | Admitting: Cardiovascular Disease

## 2023-01-31 ENCOUNTER — Other Ambulatory Visit (HOSPITAL_COMMUNITY): Payer: Self-pay | Admitting: Registered Nurse

## 2023-01-31 DIAGNOSIS — R609 Edema, unspecified: Secondary | ICD-10-CM | POA: Diagnosis present

## 2023-02-26 ENCOUNTER — Other Ambulatory Visit: Payer: Self-pay | Admitting: Physician Assistant

## 2023-02-26 DIAGNOSIS — I82411 Acute embolism and thrombosis of right femoral vein: Secondary | ICD-10-CM

## 2023-02-27 ENCOUNTER — Inpatient Hospital Stay: Payer: Medicare Other | Admitting: Physician Assistant

## 2023-02-27 ENCOUNTER — Inpatient Hospital Stay: Payer: Medicare Other | Attending: Internal Medicine

## 2023-03-06 ENCOUNTER — Ambulatory Visit (INDEPENDENT_AMBULATORY_CARE_PROVIDER_SITE_OTHER): Payer: Medicare Other

## 2023-03-06 DIAGNOSIS — I442 Atrioventricular block, complete: Secondary | ICD-10-CM

## 2023-03-07 LAB — CUP PACEART REMOTE DEVICE CHECK
Battery Remaining Longevity: 91 mo
Battery Remaining Percentage: 93 %
Battery Voltage: 2.98 V
Brady Statistic AP VP Percent: 65 %
Brady Statistic AP VS Percent: 1 %
Brady Statistic AS VP Percent: 34 %
Brady Statistic AS VS Percent: 1 %
Brady Statistic RA Percent Paced: 63 %
Brady Statistic RV Percent Paced: 99 %
Date Time Interrogation Session: 20241023020018
Implantable Lead Connection Status: 753985
Implantable Lead Connection Status: 753985
Implantable Lead Implant Date: 20240122
Implantable Lead Implant Date: 20240122
Implantable Lead Location: 753859
Implantable Lead Location: 753860
Implantable Pulse Generator Implant Date: 20240122
Lead Channel Impedance Value: 390 Ohm
Lead Channel Impedance Value: 450 Ohm
Lead Channel Pacing Threshold Amplitude: 1 V
Lead Channel Pacing Threshold Amplitude: 1 V
Lead Channel Pacing Threshold Pulse Width: 0.5 ms
Lead Channel Pacing Threshold Pulse Width: 0.5 ms
Lead Channel Sensing Intrinsic Amplitude: 12 mV
Lead Channel Sensing Intrinsic Amplitude: 3.9 mV
Lead Channel Setting Pacing Amplitude: 2 V
Lead Channel Setting Pacing Amplitude: 2.5 V
Lead Channel Setting Pacing Pulse Width: 0.5 ms
Lead Channel Setting Sensing Sensitivity: 2 mV
Pulse Gen Model: 2272
Pulse Gen Serial Number: 5681397

## 2023-03-14 ENCOUNTER — Other Ambulatory Visit: Payer: Self-pay | Admitting: Adult Health

## 2023-03-24 ENCOUNTER — Inpatient Hospital Stay (HOSPITAL_COMMUNITY)
Admission: EM | Admit: 2023-03-24 | Discharge: 2023-03-29 | DRG: 543 | Disposition: A | Discharge status: Skilled Nursing Facility | Payer: Medicare Other | Attending: Family Medicine | Admitting: Family Medicine

## 2023-03-24 ENCOUNTER — Other Ambulatory Visit: Payer: Self-pay

## 2023-03-24 ENCOUNTER — Emergency Department (HOSPITAL_COMMUNITY): Payer: Medicare Other

## 2023-03-24 ENCOUNTER — Encounter (HOSPITAL_COMMUNITY): Payer: Self-pay | Admitting: Internal Medicine

## 2023-03-24 ENCOUNTER — Inpatient Hospital Stay (HOSPITAL_COMMUNITY): Payer: Medicare Other

## 2023-03-24 DIAGNOSIS — G20C Parkinsonism, unspecified: Secondary | ICD-10-CM | POA: Diagnosis present

## 2023-03-24 DIAGNOSIS — R410 Disorientation, unspecified: Secondary | ICD-10-CM | POA: Diagnosis not present

## 2023-03-24 DIAGNOSIS — Z860100 Personal history of colon polyps, unspecified: Secondary | ICD-10-CM

## 2023-03-24 DIAGNOSIS — Z7901 Long term (current) use of anticoagulants: Secondary | ICD-10-CM

## 2023-03-24 DIAGNOSIS — R262 Difficulty in walking, not elsewhere classified: Secondary | ICD-10-CM | POA: Diagnosis present

## 2023-03-24 DIAGNOSIS — S22080A Wedge compression fracture of T11-T12 vertebra, initial encounter for closed fracture: Secondary | ICD-10-CM | POA: Diagnosis not present

## 2023-03-24 DIAGNOSIS — G4700 Insomnia, unspecified: Secondary | ICD-10-CM | POA: Diagnosis present

## 2023-03-24 DIAGNOSIS — Z79899 Other long term (current) drug therapy: Secondary | ICD-10-CM

## 2023-03-24 DIAGNOSIS — Z66 Do not resuscitate: Secondary | ICD-10-CM | POA: Diagnosis present

## 2023-03-24 DIAGNOSIS — M11261 Other chondrocalcinosis, right knee: Secondary | ICD-10-CM | POA: Diagnosis present

## 2023-03-24 DIAGNOSIS — Z950 Presence of cardiac pacemaker: Secondary | ICD-10-CM | POA: Diagnosis present

## 2023-03-24 DIAGNOSIS — Z23 Encounter for immunization: Secondary | ICD-10-CM

## 2023-03-24 DIAGNOSIS — R2681 Unsteadiness on feet: Secondary | ICD-10-CM

## 2023-03-24 DIAGNOSIS — E871 Hypo-osmolality and hyponatremia: Secondary | ICD-10-CM

## 2023-03-24 DIAGNOSIS — M4856XA Collapsed vertebra, not elsewhere classified, lumbar region, initial encounter for fracture: Secondary | ICD-10-CM | POA: Diagnosis present

## 2023-03-24 DIAGNOSIS — W050XXA Fall from non-moving wheelchair, initial encounter: Secondary | ICD-10-CM | POA: Diagnosis present

## 2023-03-24 DIAGNOSIS — Z86718 Personal history of other venous thrombosis and embolism: Secondary | ICD-10-CM

## 2023-03-24 DIAGNOSIS — Z8679 Personal history of other diseases of the circulatory system: Secondary | ICD-10-CM

## 2023-03-24 DIAGNOSIS — I442 Atrioventricular block, complete: Secondary | ICD-10-CM | POA: Diagnosis present

## 2023-03-24 DIAGNOSIS — I10 Essential (primary) hypertension: Secondary | ICD-10-CM | POA: Diagnosis present

## 2023-03-24 DIAGNOSIS — N179 Acute kidney failure, unspecified: Secondary | ICD-10-CM | POA: Diagnosis present

## 2023-03-24 DIAGNOSIS — F411 Generalized anxiety disorder: Secondary | ICD-10-CM | POA: Diagnosis present

## 2023-03-24 DIAGNOSIS — Z993 Dependence on wheelchair: Secondary | ICD-10-CM

## 2023-03-24 DIAGNOSIS — N400 Enlarged prostate without lower urinary tract symptoms: Secondary | ICD-10-CM | POA: Diagnosis present

## 2023-03-24 DIAGNOSIS — D638 Anemia in other chronic diseases classified elsewhere: Secondary | ICD-10-CM | POA: Diagnosis present

## 2023-03-24 DIAGNOSIS — S51011A Laceration without foreign body of right elbow, initial encounter: Secondary | ICD-10-CM | POA: Diagnosis present

## 2023-03-24 DIAGNOSIS — G40209 Localization-related (focal) (partial) symptomatic epilepsy and epileptic syndromes with complex partial seizures, not intractable, without status epilepticus: Secondary | ICD-10-CM | POA: Diagnosis present

## 2023-03-24 DIAGNOSIS — R29818 Other symptoms and signs involving the nervous system: Secondary | ICD-10-CM | POA: Diagnosis not present

## 2023-03-24 DIAGNOSIS — G8929 Other chronic pain: Secondary | ICD-10-CM | POA: Diagnosis present

## 2023-03-24 DIAGNOSIS — Y92009 Unspecified place in unspecified non-institutional (private) residence as the place of occurrence of the external cause: Secondary | ICD-10-CM | POA: Diagnosis not present

## 2023-03-24 DIAGNOSIS — M545 Low back pain, unspecified: Secondary | ICD-10-CM

## 2023-03-24 DIAGNOSIS — M4854XA Collapsed vertebra, not elsewhere classified, thoracic region, initial encounter for fracture: Secondary | ICD-10-CM | POA: Diagnosis present

## 2023-03-24 DIAGNOSIS — W19XXXA Unspecified fall, initial encounter: Secondary | ICD-10-CM | POA: Diagnosis not present

## 2023-03-24 DIAGNOSIS — S22000A Wedge compression fracture of unspecified thoracic vertebra, initial encounter for closed fracture: Principal | ICD-10-CM

## 2023-03-24 DIAGNOSIS — Z8546 Personal history of malignant neoplasm of prostate: Secondary | ICD-10-CM

## 2023-03-24 DIAGNOSIS — K5909 Other constipation: Secondary | ICD-10-CM | POA: Diagnosis present

## 2023-03-24 DIAGNOSIS — D649 Anemia, unspecified: Secondary | ICD-10-CM

## 2023-03-24 DIAGNOSIS — G40909 Epilepsy, unspecified, not intractable, without status epilepticus: Secondary | ICD-10-CM

## 2023-03-24 DIAGNOSIS — Z811 Family history of alcohol abuse and dependence: Secondary | ICD-10-CM

## 2023-03-24 LAB — BASIC METABOLIC PANEL
Anion gap: 11 (ref 5–15)
BUN: 19 mg/dL (ref 8–23)
CO2: 22 mmol/L (ref 22–32)
Calcium: 9.2 mg/dL (ref 8.9–10.3)
Chloride: 99 mmol/L (ref 98–111)
Creatinine, Ser: 0.76 mg/dL (ref 0.61–1.24)
GFR, Estimated: >60 mL/min (ref >60)
Glucose, Bld: 139 mg/dL — ABNORMAL HIGH (ref 70–99)
Potassium: 5.1 mmol/L (ref 3.5–5.1)
Sodium: 132 mmol/L — ABNORMAL LOW (ref 135–145)

## 2023-03-24 LAB — CBC
HCT: 33.1 % — ABNORMAL LOW (ref 39.0–52.0)
Hemoglobin: 11 g/dL — ABNORMAL LOW (ref 13.0–17.0)
MCH: 32.4 pg (ref 26.0–34.0)
MCHC: 33.2 g/dL (ref 30.0–36.0)
MCV: 97.6 fL (ref 80.0–100.0)
Platelets: 155 10*3/uL (ref 150–400)
RBC: 3.39 MIL/uL — ABNORMAL LOW (ref 4.22–5.81)
RDW: 13.2 % (ref 11.5–15.5)
WBC: 5.8 10*3/uL (ref 4.0–10.5)
nRBC: 0 % (ref 0.0–0.2)

## 2023-03-24 MED ORDER — DIVALPROEX SODIUM ER 500 MG PO TB24
500.0000 mg | ORAL_TABLET | Freq: Every day | ORAL | Status: DC
  Administered 2023-03-25 – 2023-03-26 (×2): 500 mg via ORAL
  Filled 2023-03-24 (×2): qty 1

## 2023-03-24 MED ORDER — SODIUM CHLORIDE 0.9% FLUSH
3.0000 mL | Freq: Two times a day (BID) | INTRAVENOUS | Status: DC
  Administered 2023-03-25 (×2): 3 mL via INTRAVENOUS

## 2023-03-24 MED ORDER — ONDANSETRON HCL 4 MG PO TABS: 4.0000 mg | ORAL_TABLET | Freq: Four times a day (QID) | ORAL | Status: DC | PRN

## 2023-03-24 MED ORDER — ACETAMINOPHEN 650 MG RE SUPP: 650.0000 mg | Freq: Four times a day (QID) | RECTAL | Status: DC | PRN

## 2023-03-24 MED ORDER — TRAMADOL HCL 50 MG PO TABS
50.0000 mg | ORAL_TABLET | Freq: Four times a day (QID) | ORAL | Status: DC | PRN
  Administered 2023-03-29: 50 mg via ORAL
  Filled 2023-03-24: qty 1

## 2023-03-24 MED ORDER — LACTULOSE 10 GM/15ML PO SOLN
10.0000 g | Freq: Two times a day (BID) | ORAL | Status: DC
  Administered 2023-03-26: 10 g via ORAL
  Filled 2023-03-24 (×3): qty 15
  Filled 2023-03-24: qty 30
  Filled 2023-03-24 (×4): qty 15

## 2023-03-24 MED ORDER — SODIUM CHLORIDE 0.9% FLUSH: 3.0000 mL | INTRAVENOUS | Status: DC | PRN

## 2023-03-24 MED ORDER — ONDANSETRON HCL 4 MG/2ML IJ SOLN: 4.0000 mg | Freq: Four times a day (QID) | INTRAMUSCULAR | Status: DC | PRN

## 2023-03-24 MED ORDER — PANTOPRAZOLE SODIUM 40 MG PO TBEC
40.0000 mg | DELAYED_RELEASE_TABLET | Freq: Every day | ORAL | Status: DC
  Administered 2023-03-25 – 2023-03-29 (×5): 40 mg via ORAL
  Filled 2023-03-24 (×5): qty 1

## 2023-03-24 MED ORDER — FOLIC ACID 1 MG PO TABS
1.0000 mg | ORAL_TABLET | Freq: Every day | ORAL | Status: DC
  Administered 2023-03-25 – 2023-03-29 (×5): 1 mg via ORAL
  Filled 2023-03-24 (×5): qty 1

## 2023-03-24 MED ORDER — FERROUS SULFATE 325 (65 FE) MG PO TABS
325.0000 mg | ORAL_TABLET | Freq: Every day | ORAL | Status: DC
  Administered 2023-03-26 – 2023-03-29 (×4): 325 mg via ORAL
  Filled 2023-03-24 (×5): qty 1

## 2023-03-24 MED ORDER — APIXABAN 2.5 MG PO TABS
2.5000 mg | ORAL_TABLET | Freq: Two times a day (BID) | ORAL | Status: DC
  Administered 2023-03-25 – 2023-03-29 (×9): 2.5 mg via ORAL
  Filled 2023-03-24 (×9): qty 1

## 2023-03-24 MED ORDER — TOPIRAMATE 25 MG PO TABS
50.0000 mg | ORAL_TABLET | Freq: Every day | ORAL | Status: DC
  Administered 2023-03-25 – 2023-03-28 (×4): 50 mg via ORAL
  Filled 2023-03-24 (×4): qty 2

## 2023-03-24 MED ORDER — MORPHINE SULFATE (PF) 4 MG/ML IV SOLN
4.0000 mg | Freq: Once | INTRAVENOUS | Status: AC
  Administered 2023-03-24: 4 mg via INTRAVENOUS
  Filled 2023-03-24: qty 1

## 2023-03-24 MED ORDER — DIVALPROEX SODIUM ER 500 MG PO TB24
750.0000 mg | ORAL_TABLET | Freq: Every day | ORAL | Status: DC
  Administered 2023-03-25 – 2023-03-28 (×4): 750 mg via ORAL
  Filled 2023-03-24 (×4): qty 1

## 2023-03-24 MED ORDER — SODIUM CHLORIDE 0.9 % IV SOLN: 250.0000 mL | INTRAVENOUS | Status: DC | PRN

## 2023-03-24 MED ORDER — ACETAMINOPHEN 325 MG PO TABS
650.0000 mg | ORAL_TABLET | Freq: Four times a day (QID) | ORAL | Status: DC | PRN
  Administered 2023-03-25: 650 mg via ORAL
  Filled 2023-03-24: qty 2

## 2023-03-24 MED ORDER — TAMSULOSIN HCL 0.4 MG PO CAPS
0.4000 mg | ORAL_CAPSULE | Freq: Every day | ORAL | Status: DC
  Administered 2023-03-25 – 2023-03-28 (×4): 0.4 mg via ORAL
  Filled 2023-03-24 (×4): qty 1

## 2023-03-24 NOTE — ED Notes (Signed)
Pt continues to co back pain, MD made aware

## 2023-03-24 NOTE — ED Provider Notes (Signed)
[facility] DEPARTMENT AT [facility] Provider Note   CSN: [id number] Arrival date & time: 03/24/23  2041     History {Add pertinent medical, surgical, social history, OB history to HPI:1}   Kenneth Bowen is a 89 y.o. male.  HPI   Patient has a history of prostate cancer diverticulosis, DVT, seizures bradycardia, sepsis, encephalopathy complete heart block with a pacemaker.  Patient also has history of compression fractures of his thoracic and lumbar vertebrae.  Patient presents to the ED for evaluation of a fall.  Patient states he has been having more difficulty walking over the last month or so.  Patient usually uses a walker but he has a wheelchair as well and has been using that more.  He was transferring from the wheelchair to the commode and ended up falling and hitting his head.  Patient also sustained a small skin tear to his elbow.  Patient denies any complaints.  He does take Eliquis.  He initially denied any headache but then told EMS he was having a mild 1.  He denies any chest pain.  No neck pain no back pain.  No numbness or weakness.  No fevers or chills.  Home Medications Prior to Admission medications   Medication Sig Start Date End Date Taking? Authorizing Provider  ALPRAZolam Prudy Feeler) 0.5 MG tablet Take 0.5 mg by mouth at bedtime as needed. 10/28/20   [provider]  apixaban (ELIQUIS) 2.5 MG TABS tablet Take 1 tablet (2.5 mg total) by mouth 2 (two) times daily. Resume from 06/10/22 08/07/22   Marinda Elk, MD  ARTIFICIAL TEAR OP Place 1 drop into both eyes daily as needed (dry eye).    [provider]  cholecalciferol (VITAMIN D) 1000 UNITS tablet Take 2,000 Units by mouth daily.     [provider]  desoximetasone (TOPICORT) 0.25 % cream Apply 1 Application topically at bedtime. 06/17/19   [provider]  divalproex (DEPAKOTE ER) 250 MG 24 hr tablet Take 3 tablets (750 mg total) by mouth at bedtime.  12/27/22   Ihor Austin, NP  divalproex (DEPAKOTE ER) 500 MG 24 hr tablet TAKE 1 TABLET (500 MG TOTAL) BY MOUTH DAILY. 03/14/23   Ihor Austin, NP  Ferrous Sulfate (IRON PO) Take 1 tablet by mouth daily.    [provider]  folic acid (FOLVITE) 1 MG tablet Take 1 tablet (1 mg total) by mouth daily. 06/10/22   Glade Lloyd, MD  furosemide (LASIX) 40 MG tablet Take 40 mg by mouth daily.    [provider]  lactulose (CHRONULAC) 10 GM/15ML solution Take 15 mLs (10 g total) by mouth 2 (two) times daily. 06/09/22   Glade Lloyd, MD  mometasone (ELOCON) 0.1 % ointment Apply 1 application topically 2 (two) times daily as needed (rash).  06/23/19   [provider]  Multiple Vitamin (MULTIVITAMIN) tablet Take 1 tablet by mouth daily.    [provider]  mupirocin ointment (BACTROBAN) 2 % Apply 1 Application topically 2 (two) times daily. 07/09/22   [provider]  omeprazole (PRILOSEC) 20 MG capsule Take 20 mg by mouth daily. 12/29/18   [provider]  tamsulosin (FLOMAX) 0.4 MG CAPS capsule TAKE 1 CAPSULE (0.4 MG TOTAL) BY MOUTH DAILY AFTER SUPPER. Patient taking differently: Take 0.4 mg by mouth daily. 03/21/16   Margaretmary Dys, MD  topiramate (TOPAMAX) 50 MG tablet Take 1 tablet (50 mg total) by mouth at bedtime. 12/27/22   Ihor Austin, NP  traMADol (ULTRAM) 50 MG tablet Take 1 tablet (50 mg total) by mouth every 6 (six) hours as needed for moderate pain. 08/06/22   Marinda Elk, MD  traZODone (DESYREL) 50 MG tablet Take 50 mg by mouth at bedtime. 12/13/20   [provider]      Allergies    Tape    Review of Systems   Review of Systems  Physical Exam Updated Vital Signs BP 115/73   Pulse 82   Temp 97.9 F (36.6 C) (Oral)   Resp (!) 24   SpO2 95%  Physical Exam Vitals and nursing note reviewed.  Constitutional:      General: He is not in acute distress.    Appearance: He is well-developed.  HENT:     Head:  Normocephalic and atraumatic.     Right Ear: External ear normal.     Left Ear: External ear normal.  Eyes:     General: No scleral icterus.       Right eye: No discharge.        Left eye: No discharge.     Conjunctiva/sclera: Conjunctivae normal.  Neck:     Trachea: No tracheal deviation.  Cardiovascular:     Rate and Rhythm: Normal rate and regular rhythm.  Pulmonary:     Effort: Pulmonary effort is normal. No respiratory distress.     Breath sounds: Normal breath sounds. No stridor. No wheezing or rales.  Abdominal:     General: Bowel sounds are normal. There is no distension.     Palpations: Abdomen is soft.     Tenderness: There is no abdominal tenderness. There is no guarding or rebound.  Musculoskeletal:        General: No tenderness or deformity.     Cervical back: Neck supple.     Right lower leg: Edema present.     Left lower leg: Edema present.     Comments: Edema noted bilateral lower extremities  Skin:    General: Skin is warm and dry.     Findings: No rash.  Neurological:     General: No focal deficit present.     Mental Status: He is alert.     Cranial Nerves: No cranial nerve deficit, dysarthria or facial asymmetry.     Sensory: No sensory deficit.     Motor: No abnormal muscle tone or seizure activity.     Coordination: Coordination normal.     Comments: Patient able to sit up in bed able to lift both arms and legs off the bed without difficulty, normal sensation throughout  Psychiatric:        Mood and Affect: Mood normal.     ED Results / Procedures / Treatments   Labs (all labs ordered are listed, but only abnormal results are displayed) Labs Reviewed  CBC - Abnormal; Notable for the following components:      Result Value   RBC 3.39 (*)    Hemoglobin 11.0 (*)    HCT 33.1 (*)    All other components within normal limits  BASIC METABOLIC PANEL - Abnormal; Notable for the following components:   Sodium 132 (*)    Glucose, Bld 139 (*)    All  other components within normal limits    EKG EKG Interpretation Date/Time:  Sunday March 24 2023 21:02:32 EST Ventricular Rate:  81 PR Interval:  229 QRS Duration:  123 QT Interval:  405 QTC Calculation: 471 R Axis:   -41  Text Interpretation: Sinus rhythm  Prolonged PR interval IVCD, consider atypical RBBB LVH with IVCD, LAD and secondary repol abnrm Inferior infarct, old Confirmed by Linwood Dibbles [id number]) on 03/24/2023 9:06:33 PM  Radiology DG Thoracic Spine 2 View  Result Date: 03/24/2023 CLINICAL DATA:  fall, EXAM: THORACIC SPINE 2 VIEWS; LUMBAR SPINE - COMPLETE 4+ VIEW COMPARISON:  None Available. FINDINGS: Limited evaluation due to overlapping osseous structures and overlying soft tissues. Multilevel severe degenerative changes of the spine. Multilevel thoracic vertebral body height loss with almost complete flattening of the T12 vertebral body. Multilevel lumbar vertebral body height loss. There is no evidence of thoracolumbar acute displaced spine fracture. In saturated kyphotic curvature at the T12 level. Grade 1 anterolisthesis of L4 on L5. Multilevel intervertebral disc space narrowing. No other significant bone abnormalities are identified. IMPRESSION: Multilevel likely chronic vertebral body height loss with almost complete flattening of the T12 level. Severe multilevel degenerative changes of the spine. Limited evaluation due to overlapping osseous structures and overlying soft tissues. Recommend cross-sectional imaging for further evaluation if concern for acute injury. Electronically Signed   By: Tish Frederickson M.D.   On: 03/24/2023 22:46   DG Lumbar Spine Complete  Result Date: 03/24/2023 CLINICAL DATA:  fall, EXAM: THORACIC SPINE 2 VIEWS; LUMBAR SPINE - COMPLETE 4+ VIEW COMPARISON:  None Available. FINDINGS: Limited evaluation due to overlapping osseous structures and overlying soft tissues. Multilevel severe degenerative changes of the spine. Multilevel thoracic vertebral  body height loss with almost complete flattening of the T12 vertebral body. Multilevel lumbar vertebral body height loss. There is no evidence of thoracolumbar acute displaced spine fracture. In saturated kyphotic curvature at the T12 level. Grade 1 anterolisthesis of L4 on L5. Multilevel intervertebral disc space narrowing. No other significant bone abnormalities are identified. IMPRESSION: Multilevel likely chronic vertebral body height loss with almost complete flattening of the T12 level. Severe multilevel degenerative changes of the spine. Limited evaluation due to overlapping osseous structures and overlying soft tissues. Recommend cross-sectional imaging for further evaluation if concern for acute injury. Electronically Signed   By: Tish Frederickson M.D.   On: 03/24/2023 22:46   CT Head Wo Contrast  Result Date: 03/24/2023 CLINICAL DATA:  Head and neck trauma and pain, on blood thinner EXAM: CT HEAD WITHOUT CONTRAST CT CERVICAL SPINE WITHOUT CONTRAST TECHNIQUE: Multidetector CT imaging of the head and cervical spine was performed following the standard protocol without intravenous contrast. Multiplanar CT image reconstructions of the cervical spine were also generated. RADIATION DOSE REDUCTION: This exam was performed according to the departmental dose-optimization program which includes automated exposure control, adjustment of the mA and/or kV according to patient size and/or use of iterative reconstruction technique. COMPARISON:  08/02/2022 CT head and cervical spine FINDINGS: CT HEAD FINDINGS Brain: No evidence of acute infarct, hemorrhage, mass, mass effect, or midline shift. No hydrocephalus or extra-axial fluid collection. Age related cerebral atrophy. Periventricular white matter changes, likely the sequela of chronic small vessel ischemic disease. Lacunar infarcts in the bilateral basal ganglia. Vascular: No hyperdense vessel. Skull: Negative for fracture or focal lesion. Sinuses/Orbits: No acute  finding. Status post bilateral lens replacements. Other: The mastoid air cells are well aerated. CT CERVICAL SPINE FINDINGS Alignment: No traumatic listhesis. Skull base and vertebrae: No acute fracture or suspicious osseous lesion. Chronic compression deformity of C5, unchanged. Soft tissues and spinal canal: No prevertebral fluid or swelling. No visible canal hematoma. Disc levels: Degenerative changes in the cervical spine.Moderate spinal canal stenosis at C4-C5 and C6-C7. Upper chest: No focal pulmonary opacity or  pleural effusion. IMPRESSION: 1. No acute intracranial process. 2. No acute fracture or traumatic listhesis in the cervical spine. Electronically Signed   By: Wiliam Ke M.D.   On: 03/24/2023 22:24   CT Cervical Spine Wo Contrast  Result Date: 03/24/2023 CLINICAL DATA:  Head and neck trauma and pain, on blood thinner EXAM: CT HEAD WITHOUT CONTRAST CT CERVICAL SPINE WITHOUT CONTRAST TECHNIQUE: Multidetector CT imaging of the head and cervical spine was performed following the standard protocol without intravenous contrast. Multiplanar CT image reconstructions of the cervical spine were also generated. RADIATION DOSE REDUCTION: This exam was performed according to the departmental dose-optimization program which includes automated exposure control, adjustment of the mA and/or kV according to patient size and/or use of iterative reconstruction technique. COMPARISON:  08/02/2022 CT head and cervical spine FINDINGS: CT HEAD FINDINGS Brain: No evidence of acute infarct, hemorrhage, mass, mass effect, or midline shift. No hydrocephalus or extra-axial fluid collection. Age related cerebral atrophy. Periventricular white matter changes, likely the sequela of chronic small vessel ischemic disease. Lacunar infarcts in the bilateral basal ganglia. Vascular: No hyperdense vessel. Skull: Negative for fracture or focal lesion. Sinuses/Orbits: No acute finding. Status post bilateral lens replacements. Other:  The mastoid air cells are well aerated. CT CERVICAL SPINE FINDINGS Alignment: No traumatic listhesis. Skull base and vertebrae: No acute fracture or suspicious osseous lesion. Chronic compression deformity of C5, unchanged. Soft tissues and spinal canal: No prevertebral fluid or swelling. No visible canal hematoma. Disc levels: Degenerative changes in the cervical spine.Moderate spinal canal stenosis at C4-C5 and C6-C7. Upper chest: No focal pulmonary opacity or pleural effusion. IMPRESSION: 1. No acute intracranial process. 2. No acute fracture or traumatic listhesis in the cervical spine. Electronically Signed   By: Wiliam Ke M.D.   On: 03/24/2023 22:24    Procedures Procedures  {Document cardiac monitor, telemetry assessment procedure when appropriate:1}  Medications Ordered in ED Medications  morphine (PF) 4 MG/ML injection 4 mg (has no administration in time range)    ED Course/ Medical Decision Making/ A&P Clinical Course as of 03/24/23 2335  [facility] Mar 24, 2023  2233 CBC(!) Hemoglobin stable. [JK]  2233 Basic metabolic panel(!) Metabolic panel normal [JK]  2233 Head CT and C-spine CT without acute abnormalities [JK]  2257 Thoracic spine film shows multi level vertebral body height loss with flattening of the T12 level severe degenerative changes in the spine [JK]    Clinical Course User Index [JK] Linwood Dibbles, MD   {   Click here for ABCD2, HEART and other calculatorsREFRESH Note before signing :1}                              Medical Decision Making Problems Addressed: Compression fracture of body of thoracic vertebra [facility]): acute illness or injury that poses a threat to life or bodily functions Fall, initial encounter: acute illness or injury that poses a threat to life or bodily functions  Amount and/or Complexity of Data Reviewed Labs: ordered. Decision-making details documented in ED Course. Radiology: ordered.  Risk Prescription drug management.   Patient  presented to the ED for evaluation of fall head injury.  Patient states he also has been having worsening back pain and weakness.  Increasing difficulty ambulating.  Patient states it is hard for him to stand.  ED workup without signs of serious head injury.  Patient's laboratory tests were unremarkable.  No signs of dehydration or anemia.  X-rays however  show severe degenerative changes and a significant compression fracture of T12 .  I suspect the patient's difficulty with ambulation was related to that.  Unclear if this is an acute fracture versus old.  With his complaints of weakness will order MRI of thoracic and lumbar spine.  Patient however has a pacemaker and will not be able to get this done until the morning.  I will consult the medical service for admission and further evaluation.  {Document critical care time when appropriate:1} {Document review of labs and clinical decision tools ie heart score, Chads2Vasc2 etc:1}  {Document your independent review of radiology images, and any outside records:1} {Document your discussion with family members, caretakers, and with consultants:1} {Document social determinants of health affecting pt's care:1} {Document your decision making why or why not admission, treatments were needed:1} Final Clinical Impression(s) / ED Diagnoses Final diagnoses:  Compression fracture of body of thoracic vertebra (HCC)  Fall, initial encounter    Rx / DC Orders ED Discharge Orders     None

## 2023-03-24 NOTE — H&P (Addendum)
History and Physical    Kenneth Bowen MWU:[id number] DOB: 1930-08-30 DOA: 03/24/2023  PCP: Charlane Ferretti, DO   Patient coming from: Home   Chief Complaint:  Chief Complaint  Patient presents with   Fall   ED TRIAGE note:  HPI:  Kenneth Bowen is a 89 y.o. male with medical history significant of of chronic heart block status post pacemaker, Parkison like syndrome, prostate cancer, seizure disorder, right lower extremity DVT on Eliquis, chronic constipation, BPH, chronic lower back pain and iron deficiency anemia presented to emergency department for evaluation for fall.  Patient usually uses walker but he has a wheelchair as well and he has been using that more.  He was transferring from wheelchair to the commode and ended up falling and landed on the right side of his hip..  Patient also has sustained a small skin tear to his elbow.  During my evaluation, patient denies any hitting of his head however reported he has some headache as he had not eaten anything for last 8 to 10 hours.  Denies any confusion, loss of consciousness, syncope or presyncope.  Denies any chest pain, palpitation, neck pain, generalized weakness and numbness.  Denies any fever and chill. Patient reported he used to be using walker 3 months ago but for last 3 months he is mostly wheelchair dependent due to unsteady gait and progressively worsening weakness.  In the ED patient reported that he has lower back pain that has been progressed and he is afraid to walk due to back pain.  Patient reported that he lives with his wife to an independent facility where he get some help by caretaker few hours in the morning and few hours in the evening but he is very concerned and requesting for 24-hour help and requesting for short-term skilled facility or assisted living facility placement.   ED Course:  At presentation to ED found to have low blood pressure 94/61 which has been improved 215/73, tachypneic 24, heart rate  84 and O2 sat 97% room air. BMP showing slightly low sodium 134 otherwise unremarkable.  CBC unremarkable with a stable H&H 11 and 33. EKG showed sinus rhythm heart rate 81, prolonged PR interval.  There is no ST and T wave abnormality.  X-ray thoracic and lumbar spine spine showed: Multilevel likely chronic vertebral body height loss with almost complete flattening of the T12 level. Severe multilevel degenerative changes of the spine. Limited evaluation due to overlapping osseous structures and overlying soft tissues. Recommend cross-sectional imaging for further evaluation if concern for acute injury.  Pending MRI of the lumbar and thoracic spine. CT head no acute intracranial process.  CT cervical spine no acute fracture and traumatic listhesis of cervical spine.  MRI of the lumbar and thoracic spine has been ordered given patient has a pacemaker it will not be able to done until morning.  Hospitalist has been contacted for further evaluation of generalized weakness, fall and lower back pain.   Review of Systems:  Review of Systems  Constitutional:  Negative for chills, fever, malaise/fatigue and weight loss.  HENT:  Negative for hearing loss.   Eyes:  Negative for blurred vision.  Respiratory:  Negative for cough and shortness of breath.   Cardiovascular:  Negative for chest pain and leg swelling.  Gastrointestinal:  Negative for abdominal pain, heartburn and nausea.  Musculoskeletal:  Positive for back pain and falls. Negative for myalgias and neck pain.  Neurological:  Negative for dizziness, tremors, sensory change, speech change, focal  weakness, seizures, loss of consciousness, weakness and headaches.  Endo/Heme/Allergies:  Negative for environmental allergies. Does not bruise/bleed easily.  Psychiatric/Behavioral:  The patient is not nervous/anxious.     Past Medical History:  Diagnosis Date   Bradycardia 06/02/2022   Symptomatic bradycardia now status post pacemaker  after syncope.   Colon polyps    Diverticulosis    Encephalopathy acute 06/02/2022   Hypothermia 06/02/2022   Pacemaker 05/2022   Due to complete heart block   Prostate cancer [facility]) 2015   radiation finished Nov 2015   Seizures (HCC) 07/2016   Sepsis (HCC) 06/02/2022   Unresponsiveness 07/28/2016    Past Surgical History:  Procedure Laterality Date   colonscopy     INGUINAL HERNIA REPAIR     KNEE SURGERY     PACEMAKER IMPLANT N/A 06/04/2022   Procedure: PACEMAKER IMPLANT;  Surgeon: Marinus Maw, MD;  Location: [facility];  Service: Cardiovascular;  Laterality: N/A;   PROSTATE BIOPSY     TEMPORARY PACEMAKER N/A 06/03/2022   Procedure: TEMPORARY PACEMAKER;  Surgeon: Dolores Patty, MD;  Location: [facility];  Service: Cardiovascular;  Laterality: N/A;     reports that he has never smoked. He has never used smokeless tobacco. He reports current alcohol use of about 7.0 standard drinks of alcohol per week. He reports that he does not use drugs.  Allergies  Allergen Reactions   Tape Other (See Comments)    Skin tearing     Family History  Problem Relation Age of Onset   Alcoholism Father    Cancer Neg Hx    Colon cancer Neg Hx     Prior to Admission medications   Medication Sig Start Date End Date Taking? Authorizing Provider  ALPRAZolam Prudy Feeler) 0.5 MG tablet Take 0.5 mg by mouth at bedtime as needed. 10/28/20   [provider]  apixaban (ELIQUIS) 2.5 MG TABS tablet Take 1 tablet (2.5 mg total) by mouth 2 (two) times daily. Resume from 06/10/22 08/07/22   Marinda Elk, MD  ARTIFICIAL TEAR OP Place 1 drop into both eyes daily as needed (dry eye).    [provider]  cholecalciferol (VITAMIN D) 1000 UNITS tablet Take 2,000 Units by mouth daily.     [provider]  desoximetasone (TOPICORT) 0.25 % cream Apply 1 Application topically at bedtime. 06/17/19   [provider]  divalproex (DEPAKOTE ER) 250 MG 24 hr tablet  Take 3 tablets (750 mg total) by mouth at bedtime. 12/27/22   Ihor Austin, NP  divalproex (DEPAKOTE ER) 500 MG 24 hr tablet TAKE 1 TABLET (500 MG TOTAL) BY MOUTH DAILY. 03/14/23   Ihor Austin, NP  Ferrous Sulfate (IRON PO) Take 1 tablet by mouth daily.    [provider]  folic acid (FOLVITE) 1 MG tablet Take 1 tablet (1 mg total) by mouth daily. 06/10/22   Glade Lloyd, MD  furosemide (LASIX) 40 MG tablet Take 40 mg by mouth daily.    [provider]  lactulose (CHRONULAC) 10 GM/15ML solution Take 15 mLs (10 g total) by mouth 2 (two) times daily. 06/09/22   Glade Lloyd, MD  mometasone (ELOCON) 0.1 % ointment Apply 1 application topically 2 (two) times daily as needed (rash).  06/23/19   [provider]  Multiple Vitamin (MULTIVITAMIN) tablet Take 1 tablet by mouth daily.    [provider]  mupirocin ointment (BACTROBAN) 2 % Apply 1 Application topically 2 (two) times daily. 07/09/22   [provider]  omeprazole (PRILOSEC) 20 MG capsule Take 20 mg by mouth daily. 12/29/18   [provider]  tamsulosin (FLOMAX) 0.4 MG CAPS capsule TAKE 1 CAPSULE (0.4 MG TOTAL) BY MOUTH DAILY AFTER SUPPER. Patient taking differently: Take 0.4 mg by mouth daily. 03/21/16   Margaretmary Dys, MD  topiramate (TOPAMAX) 50 MG tablet Take 1 tablet (50 mg total) by mouth at bedtime. 12/27/22   Ihor Austin, NP  traMADol (ULTRAM) 50 MG tablet Take 1 tablet (50 mg total) by mouth every 6 (six) hours as needed for moderate pain. 08/06/22   Marinda Elk, MD  traZODone (DESYREL) 50 MG tablet Take 50 mg by mouth at bedtime. 12/13/20   [provider]     Physical Exam: Vitals:   03/24/23 2200 03/24/23 2215 03/24/23 2315 03/25/23 0015  BP: 111/64 133/72 115/73 (!) 105/57  Pulse: 77 87 82 79  Resp: 17 (!) 36 (!) 24 18  Temp:      TempSrc:      SpO2: 99% 94% 95% 98%    Physical Exam Constitutional:      General: He is not in acute distress.     Appearance: He is not ill-appearing.  HENT:     Nose: Nose normal.     Mouth/Throat:     Mouth: Mucous membranes are moist.  Eyes:     Conjunctiva/sclera: Conjunctivae normal.  Cardiovascular:     Rate and Rhythm: Normal rate and regular rhythm.     Pulses: Normal pulses.  Pulmonary:     Effort: Pulmonary effort is normal.  Abdominal:     General: Bowel sounds are normal.     Palpations: Abdomen is soft.  Musculoskeletal:        General: No swelling, tenderness, deformity or signs of injury. Normal range of motion.     Cervical back: Neck supple.     Right lower leg: Edema present.     Left lower leg: No edema.     Comments: Back pain around lower thoracic and lumbar level.  Skin:    Capillary Refill: Capillary refill takes less than 2 seconds.  Neurological:     Mental Status: He is alert and oriented to person, place, and time.     Cranial Nerves: No cranial nerve deficit.     Sensory: No sensory deficit.     Motor: No weakness.     Coordination: Coordination normal.     Deep Tendon Reflexes: Reflexes normal.  Psychiatric:        Mood and Affect: Mood normal.        Thought Content: Thought content normal.        Judgment: Judgment normal.      Labs on Admission: I have personally reviewed following labs and imaging studies  CBC: Recent Labs  Lab 03/24/23 2103  WBC 5.8  HGB 11.0*  HCT 33.1*  MCV 97.6  PLT 155   Basic Metabolic Panel: Recent Labs  Lab 03/24/23 2103  NA 132*  K 5.1  CL 99  CO2 22  GLUCOSE 139*  BUN 19  CREATININE 0.76  CALCIUM 9.2   GFR: CrCl cannot be calculated (Unknown ideal weight.). Liver Function Tests: No results for input(s): "AST", "ALT", "ALKPHOS", "BILITOT", "PROT", "ALBUMIN" in the last 168 hours. No results for input(s): "LIPASE", "AMYLASE" in the last 168 hours. No results for input(s): "AMMONIA" in the last 168 hours. Coagulation Profile: No results for input(s): "INR", "PROTIME" in the last 168 hours. Cardiac  Enzymes:  No results for input(s): "CKTOTAL", "CKMB", "CKMBINDEX", "TROPONINI", "TROPONINIHS" in the last 168 hours. BNP (last 3 results) No results for input(s): "BNP" in the last 8760 hours. HbA1C: No results for input(s): "HGBA1C" in the last 72 hours. CBG: No results for input(s): "GLUCAP" in the last 168 hours. Lipid Profile: No results for input(s): "CHOL", "HDL", "LDLCALC", "TRIG", "CHOLHDL", "LDLDIRECT" in the last 72 hours. Thyroid Function Tests: No results for input(s): "TSH", "T4TOTAL", "FREET4", "T3FREE", "THYROIDAB" in the last 72 hours. Anemia Panel: No results for input(s): "VITAMINB12", "FOLATE", "FERRITIN", "TIBC", "IRON", "RETICCTPCT" in the last 72 hours. Urine analysis:    Component Value Date/Time   COLORURINE STRAW (A) 08/02/2022 0845   APPEARANCEUR CLEAR 08/02/2022 0845   LABSPEC 1.013 08/02/2022 0845   PHURINE 8.0 08/02/2022 0845   GLUCOSEU NEGATIVE 08/02/2022 0845   HGBUR SMALL (A) 08/02/2022 0845   BILIRUBINUR NEGATIVE 08/02/2022 0845   KETONESUR NEGATIVE 08/02/2022 0845   PROTEINUR NEGATIVE 08/02/2022 0845   NITRITE NEGATIVE 08/02/2022 0845   LEUKOCYTESUR NEGATIVE 08/02/2022 0845    Radiological Exams on Admission: I have personally reviewed images DG Pelvis Portable  Result Date: 03/25/2023 CLINICAL DATA:  Status post fall at home, rule out fracture or dislocation. EXAM: PORTABLE PELVIS 1-2 VIEWS; RIGHT FEMUR 2 VIEWS COMPARISON:  08/02/2022. FINDINGS: Pelvis: No acute fracture or dislocation is seen. Mild degenerative changes are noted at the hips bilaterally. Degenerative changes are present in the lower lumbar spine. Surgical clips are present in the inguinal region on the right. Femur: No acute fracture or dislocation is seen. Degenerative changes are present at the hip and knee. Chondrocalcinosis is noted at the knee. Vascular calcifications are present in the soft tissues. IMPRESSION: No acute fracture or dislocation. Electronically Signed   By:  Thornell Sartorius M.D.   On: 03/25/2023 02:04   DG FEMUR, MIN 2 VIEWS RIGHT  Result Date: 03/25/2023 CLINICAL DATA:  Status post fall at home, rule out fracture or dislocation. EXAM: PORTABLE PELVIS 1-2 VIEWS; RIGHT FEMUR 2 VIEWS COMPARISON:  08/02/2022. FINDINGS: Pelvis: No acute fracture or dislocation is seen. Mild degenerative changes are noted at the hips bilaterally. Degenerative changes are present in the lower lumbar spine. Surgical clips are present in the inguinal region on the right. Femur: No acute fracture or dislocation is seen. Degenerative changes are present at the hip and knee. Chondrocalcinosis is noted at the knee. Vascular calcifications are present in the soft tissues. IMPRESSION: No acute fracture or dislocation. Electronically Signed   By: Thornell Sartorius M.D.   On: 03/25/2023 02:04   DG Thoracic Spine 2 View  Result Date: 03/24/2023 CLINICAL DATA:  fall, EXAM: THORACIC SPINE 2 VIEWS; LUMBAR SPINE - COMPLETE 4+ VIEW COMPARISON:  None Available. FINDINGS: Limited evaluation due to overlapping osseous structures and overlying soft tissues. Multilevel severe degenerative changes of the spine. Multilevel thoracic vertebral body height loss with almost complete flattening of the T12 vertebral body. Multilevel lumbar vertebral body height loss. There is no evidence of thoracolumbar acute displaced spine fracture. In saturated kyphotic curvature at the T12 level. Grade 1 anterolisthesis of L4 on L5. Multilevel intervertebral disc space narrowing. No other significant bone abnormalities are identified. IMPRESSION: Multilevel likely chronic vertebral body height loss with almost complete flattening of the T12 level. Severe multilevel degenerative changes of the spine. Limited evaluation due to overlapping osseous structures and overlying soft tissues. Recommend cross-sectional imaging for further evaluation if concern for acute injury. Electronically Signed   By: Normajean Glasgow.D.  On:  03/24/2023 22:46   DG Lumbar Spine Complete  Result Date: 03/24/2023 CLINICAL DATA:  fall, EXAM: THORACIC SPINE 2 VIEWS; LUMBAR SPINE - COMPLETE 4+ VIEW COMPARISON:  None Available. FINDINGS: Limited evaluation due to overlapping osseous structures and overlying soft tissues. Multilevel severe degenerative changes of the spine. Multilevel thoracic vertebral body height loss with almost complete flattening of the T12 vertebral body. Multilevel lumbar vertebral body height loss. There is no evidence of thoracolumbar acute displaced spine fracture. In saturated kyphotic curvature at the T12 level. Grade 1 anterolisthesis of L4 on L5. Multilevel intervertebral disc space narrowing. No other significant bone abnormalities are identified. IMPRESSION: Multilevel likely chronic vertebral body height loss with almost complete flattening of the T12 level. Severe multilevel degenerative changes of the spine. Limited evaluation due to overlapping osseous structures and overlying soft tissues. Recommend cross-sectional imaging for further evaluation if concern for acute injury. Electronically Signed   By: Tish Frederickson M.D.   On: 03/24/2023 22:46   CT Head Wo Contrast  Result Date: 03/24/2023 CLINICAL DATA:  Head and neck trauma and pain, on blood thinner EXAM: CT HEAD WITHOUT CONTRAST CT CERVICAL SPINE WITHOUT CONTRAST TECHNIQUE: Multidetector CT imaging of the head and cervical spine was performed following the standard protocol without intravenous contrast. Multiplanar CT image reconstructions of the cervical spine were also generated. RADIATION DOSE REDUCTION: This exam was performed according to the departmental dose-optimization program which includes automated exposure control, adjustment of the mA and/or kV according to patient size and/or use of iterative reconstruction technique. COMPARISON:  08/02/2022 CT head and cervical spine FINDINGS: CT HEAD FINDINGS Brain: No evidence of acute infarct, hemorrhage,  mass, mass effect, or midline shift. No hydrocephalus or extra-axial fluid collection. Age related cerebral atrophy. Periventricular white matter changes, likely the sequela of chronic small vessel ischemic disease. Lacunar infarcts in the bilateral basal ganglia. Vascular: No hyperdense vessel. Skull: Negative for fracture or focal lesion. Sinuses/Orbits: No acute finding. Status post bilateral lens replacements. Other: The mastoid air cells are well aerated. CT CERVICAL SPINE FINDINGS Alignment: No traumatic listhesis. Skull base and vertebrae: No acute fracture or suspicious osseous lesion. Chronic compression deformity of C5, unchanged. Soft tissues and spinal canal: No prevertebral fluid or swelling. No visible canal hematoma. Disc levels: Degenerative changes in the cervical spine.Moderate spinal canal stenosis at C4-C5 and C6-C7. Upper chest: No focal pulmonary opacity or pleural effusion. IMPRESSION: 1. No acute intracranial process. 2. No acute fracture or traumatic listhesis in the cervical spine. Electronically Signed   By: Wiliam Ke M.D.   On: 03/24/2023 22:24   CT Cervical Spine Wo Contrast  Result Date: 03/24/2023 CLINICAL DATA:  Head and neck trauma and pain, on blood thinner EXAM: CT HEAD WITHOUT CONTRAST CT CERVICAL SPINE WITHOUT CONTRAST TECHNIQUE: Multidetector CT imaging of the head and cervical spine was performed following the standard protocol without intravenous contrast. Multiplanar CT image reconstructions of the cervical spine were also generated. RADIATION DOSE REDUCTION: This exam was performed according to the departmental dose-optimization program which includes automated exposure control, adjustment of the mA and/or kV according to patient size and/or use of iterative reconstruction technique. COMPARISON:  08/02/2022 CT head and cervical spine FINDINGS: CT HEAD FINDINGS Brain: No evidence of acute infarct, hemorrhage, mass, mass effect, or midline shift. No hydrocephalus or  extra-axial fluid collection. Age related cerebral atrophy. Periventricular white matter changes, likely the sequela of chronic small vessel ischemic disease. Lacunar infarcts in the bilateral basal ganglia. Vascular: No hyperdense vessel.  Skull: Negative for fracture or focal lesion. Sinuses/Orbits: No acute finding. Status post bilateral lens replacements. Other: The mastoid air cells are well aerated. CT CERVICAL SPINE FINDINGS Alignment: No traumatic listhesis. Skull base and vertebrae: No acute fracture or suspicious osseous lesion. Chronic compression deformity of C5, unchanged. Soft tissues and spinal canal: No prevertebral fluid or swelling. No visible canal hematoma. Disc levels: Degenerative changes in the cervical spine.Moderate spinal canal stenosis at C4-C5 and C6-C7. Upper chest: No focal pulmonary opacity or pleural effusion. IMPRESSION: 1. No acute intracranial process. 2. No acute fracture or traumatic listhesis in the cervical spine. Electronically Signed   By: Wiliam Ke M.D.   On: 03/24/2023 22:24    EKG: My personal interpretation of EKG shows:  Sinus rhythm, heart rate 81, prolonged PR interval, right bundle branch block and left ventricular hypertrophy.  There is no ST and T wave abnormality.   Assessment/Plan: Principal Problem:   Fall at home, initial encounter Active Problems:   History of T12 compression fracture (HCC)   Seizure disorder (HCC)   Chronic lower back pain   History of heart block   Parkinsonian features   Unstable gait   History of DVT (deep vein thrombosis)   BPH (benign prostatic hyperplasia)   Complex partial seizure (HCC)   S/P placement of cardiac pacemaker   Normocytic anemia   Insomnia   GAD (generalized anxiety disorder)   Hyponatremia    Assessment and Plan: Mechanical fall at home Chronic lower back pain History of chronic unstable gait > Patient reported he has ongoing chronic lower back pain, progressively worsening generalized  weakness and unsteady gait over the course of last 3 months.  He used to ambulate with a walker but for last 3 months has been using wheelchair mostly at home. - Had an accidental fall when transferring from wheelchair to toilet bowl.  Reported landed on the right sided hip.  Denies any hitting head with the wall or on the floor.  Patient complaining about generalized headache since he has not been ate anything almost 8 to 10 hours.  Denies any blurry vision, nausea and vomiting. - X-ray thoracic and lumbar spine showed multilevel chronic vertebral body height loss with almost complete flattening of the T2 level.  Severe multilevel degenerative change of the spine.  Recommended cross-sectional imaging for further evaluation. -Pending MRI of lumbar thoracic spine.  Cannot done until daytime given patient has a pacemaker. -Physical exam no leg length shortening.  Bilateral lower extremity muscle strength 5/5 and patient able to lift bilateral lower leg against gravity. - CT head no acute intracranial process.  CT cervical spine no fracture or traumatic listhesis in the cervical spine . -Obtaining x-ray of pelvis and right sided femur. -It is seems likely patient had a mechanical fall in the context of using Xanax and trazodone as well as progressively worsening bilateral lower extremity weakness, and with a history of unsteady gait and parkinsonian feature it might have been contributing as well. -Consulted inpatient PT and OT for evaluation of balance.  Fall precaution. - Patient requesting for assisted living facility or SNF placement.  Consulted transition care team. - Continue Tylenol as needed. -Holding Xanax and trazodone as patient coming with fall and unsteady gait. Addendum: -X-ray right femur no acute fracture dislocation and x-ray pelvis no fracture.   X-ray pelvis showed mild degenerative change at the bilateral lower, degenerative changes lower lumbar spine.  Surgical clips are present in  the inguinal region on the right.  X-ray femur showed degenerative changes of hip and knee.  Chondrocalcinosis of the knee.  Vascular calcification in the soft tissue.   Mild hyponatremia - Serum sodium 132.  Checking serum osmolarity, urine Osmo and urine sodium.  Continue to monitor serum sodium level.  History of Parkinsonian features and unstable gait -Patient follows outpatient neurology.  History of complex partial seizure -continue Depakote 750 mg at bedtime and 500 mg in the morning. - Continue Topamax 50 mg at bedtime.  History of DVT on Eliquis -Patient reported history of DVT of the right lower extremity in 01/2023.  Need to continue at least 6 months of Eliquis. -Physical exam showed right-sided lower extremity skin erythema and swelling.  Patient denies any tenderness to touch.  Denies any fever and chills.  No concern for cellulitis at this time. - Continue Eliquis 2.5 mg twice daily.  Complete heart block status post pacemaker placement in August 2024. - Heart rate within good range around 79-84.  EKG showing sinus rhythm heart rate 81.  Patient denies any chest pain, chest pressure, shortness of breath and palpitation.  Continue cardiac monitoring.  Essential hypertension -Blood pressure within lower range.  Holding Lasix for now.  If blood pressure improved will initiate or decrease the dose of Lasix to 20 mg.  Generalized anxiety disorder - Hold G Xanax as patient coming with fall and unsteady gait.  Anemia of chronic disease - Continue oral iron and folic acid supplement.  BPH - Continue Flomax  Insomnia - Holding trazodone as patient has history of unsteady gait and coming with fall.   DVT prophylaxis:  Eliquis Code Status:  DNR/DNI(Do NOT Intubate).  Reviewed ACP and verified with patient. Diet: Heart healthy diet. Family Communication: Currently no family member at bedside now Disposition Plan: Pending MRI of thoracic and lumbar spine.  Tentative  discharge to skilled nursing facility versus [facility] facility next 2 to 3 days. Consults: PT, OT and transition care team Admission status:   Inpatient, Telemetry bed  Severity of Illness: The appropriate patient status for this patient is INPATIENT. Inpatient status is judged to be reasonable and necessary in order to provide the required intensity of service to ensure the patient's safety. The patient's presenting symptoms, physical exam findings, and initial radiographic and laboratory data in the context of their chronic comorbidities is felt to place them at high risk for further clinical deterioration. Furthermore, it is not anticipated that the patient will be medically stable for discharge from the hospital within 2 midnights of admission.   * I certify that at the point of admission it is my clinical judgment that the patient will require inpatient hospital care spanning beyond 2 midnights from the point of admission due to high intensity of service, high risk for further deterioration and high frequency of surveillance required.Marland Kitchen    Tereasa Coop, MD [facility] Hospitalists  How to contact the [facility] Attending or Consulting provider 7A - 7P or covering provider during after hours 7P -7A, for this patient.  Check the care team in [facility] and look for a) attending/consulting TR[facility] provider listed and b) the [facility] team listed Log into [email or web address] and use [place]'s universal password to access. If you do not have the password, please contact the hospital operator. Locate the [facility] provider you are looking for under [facility] and page to a number that you can be directly reached. If you still have difficulty reaching the provider, please page the [facility] (Director on Call) for the Hospitalists listed on  amion for assistance.  03/25/2023, 3:20 AM

## 2023-03-24 NOTE — ED Triage Notes (Signed)
Pt fell transferring from wheel chair to a toilet, pt reports low back pain, has chronic low back pain and has had weakness for 3 weeks, reports that he normally uses cane but due to the back pain and weakness he has been in a wheel chair, pt has skin tear noted to the elbow. A&O, denies any other injury, c-collar in place on arrival.

## 2023-03-24 NOTE — ED Notes (Signed)
ED Provider at bedside. 

## 2023-03-24 NOTE — ED Provider Notes (Incomplete)
[place] EMERGENCY DEPARTMENT AT [facility] Provider Note   CSN: [id number] Arrival date & time: 03/24/23  2041     History {Add pertinent medical, surgical, social history, OB history to HPI:1}   Kenneth Bowen is a 89 y.o. male.  HPI   Patient has a history of prostate cancer diverticulosis, DVT, seizures bradycardia, sepsis, encephalopathy complete heart block with a pacemaker.  Patient also has history of compression fractures of his thoracic and lumbar vertebrae.  Patient presents to the ED for evaluation of a fall.  Patient states he has been having more difficulty walking over the last month or so.  Patient usually uses a walker but he has a wheelchair as well and has been using that more.  He was transferring from the wheelchair to the commode and ended up falling and hitting his head.  Patient also sustained a small skin tear to his elbow.  Patient denies any complaints.  He does take Eliquis.  He initially denied any headache but then told EMS he was having a mild 1.  He denies any chest pain.  No neck pain no back pain.  No numbness or weakness.  No fevers or chills.  Home Medications Prior to Admission medications   Medication Sig Start Date End Date Taking? Authorizing Provider  ALPRAZolam Prudy Feeler) 0.5 MG tablet Take 0.5 mg by mouth at bedtime as needed. 10/28/20   [provider]  apixaban (ELIQUIS) 2.5 MG TABS tablet Take 1 tablet (2.5 mg total) by mouth 2 (two) times daily. Resume from 06/10/22 08/07/22   Marinda Elk, MD  ARTIFICIAL TEAR OP Place 1 drop into both eyes daily as needed (dry eye).    [provider]  cholecalciferol (VITAMIN D) 1000 UNITS tablet Take 2,000 Units by mouth daily.     [provider]  desoximetasone (TOPICORT) 0.25 % cream Apply 1 Application topically at bedtime. 06/17/19   [provider]  divalproex (DEPAKOTE ER) 250 MG 24 hr tablet Take 3 tablets (750 mg total) by mouth at bedtime.  12/27/22   Ihor Austin, NP  divalproex (DEPAKOTE ER) 500 MG 24 hr tablet TAKE 1 TABLET (500 MG TOTAL) BY MOUTH DAILY. 03/14/23   Ihor Austin, NP  Ferrous Sulfate (IRON PO) Take 1 tablet by mouth daily.    [provider]  folic acid (FOLVITE) 1 MG tablet Take 1 tablet (1 mg total) by mouth daily. 06/10/22   Glade Lloyd, MD  furosemide (LASIX) 40 MG tablet Take 40 mg by mouth daily.    [provider]  lactulose (CHRONULAC) 10 GM/15ML solution Take 15 mLs (10 g total) by mouth 2 (two) times daily. 06/09/22   Glade Lloyd, MD  mometasone (ELOCON) 0.1 % ointment Apply 1 application topically 2 (two) times daily as needed (rash).  06/23/19   [provider]  Multiple Vitamin (MULTIVITAMIN) tablet Take 1 tablet by mouth daily.    [provider]  mupirocin ointment (BACTROBAN) 2 % Apply 1 Application topically 2 (two) times daily. 07/09/22   [provider]  omeprazole (PRILOSEC) 20 MG capsule Take 20 mg by mouth daily. 12/29/18   [provider]  tamsulosin (FLOMAX) 0.4 MG CAPS capsule TAKE 1 CAPSULE (0.4 MG TOTAL) BY MOUTH DAILY AFTER SUPPER. Patient taking differently: Take 0.4 mg by mouth daily. 03/21/16   Margaretmary Dys, MD  topiramate (TOPAMAX) 50 MG tablet Take 1 tablet (50 mg total) by mouth at bedtime. 12/27/22   Ihor Austin, NP  traMADol (ULTRAM) 50 MG tablet Take 1 tablet (50 mg total) by mouth every 6 (six) hours as needed for moderate pain. 08/06/22   Marinda Elk, MD  traZODone (DESYREL) 50 MG tablet Take 50 mg by mouth at bedtime. 12/13/20   [provider]      Allergies    Tape    Review of Systems   Review of Systems  Physical Exam Updated Vital Signs BP 115/73   Pulse 82   Temp 97.9 F (36.6 C) (Oral)   Resp (!) 24   SpO2 95%  Physical Exam Vitals and nursing note reviewed.  Constitutional:      General: He is not in acute distress.    Appearance: He is well-developed.  HENT:     Head:  Normocephalic and atraumatic.     Right Ear: External ear normal.     Left Ear: External ear normal.  Eyes:     General: No scleral icterus.       Right eye: No discharge.        Left eye: No discharge.     Conjunctiva/sclera: Conjunctivae normal.  Neck:     Trachea: No tracheal deviation.  Cardiovascular:     Rate and Rhythm: Normal rate and regular rhythm.  Pulmonary:     Effort: Pulmonary effort is normal. No respiratory distress.     Breath sounds: Normal breath sounds. No stridor. No wheezing or rales.  Abdominal:     General: Bowel sounds are normal. There is no distension.     Palpations: Abdomen is soft.     Tenderness: There is no abdominal tenderness. There is no guarding or rebound.  Musculoskeletal:        General: No tenderness or deformity.     Cervical back: Neck supple.     Right lower leg: Edema present.     Left lower leg: Edema present.     Comments: Edema noted bilateral lower extremities  Skin:    General: Skin is warm and dry.     Findings: No rash.  Neurological:     General: No focal deficit present.     Mental Status: He is alert.     Cranial Nerves: No cranial nerve deficit, dysarthria or facial asymmetry.     Sensory: No sensory deficit.     Motor: No abnormal muscle tone or seizure activity.     Coordination: Coordination normal.     Comments: Patient able to sit up in bed able to lift both arms and legs off the bed without difficulty, normal sensation throughout  Psychiatric:        Mood and Affect: Mood normal.     ED Results / Procedures / Treatments   Labs (all labs ordered are listed, but only abnormal results are displayed) Labs Reviewed  CBC - Abnormal; Notable for the following components:      Result Value   RBC 3.39 (*)    Hemoglobin 11.0 (*)    HCT 33.1 (*)    All other components within normal limits  BASIC METABOLIC PANEL - Abnormal; Notable for the following components:   Sodium 132 (*)    Glucose, Bld 139 (*)    All  other components within normal limits    EKG EKG Interpretation Date/Time:  Sunday March 24 2023 21:02:32 EST Ventricular Rate:  81 PR Interval:  229 QRS Duration:  123 QT Interval:  405 QTC Calculation: 471 R Axis:   -41  Text Interpretation: Sinus rhythm  Prolonged PR interval IVCD, consider atypical RBBB LVH with IVCD, LAD and secondary repol abnrm Inferior infarct, old Confirmed by Linwood Dibbles [phone]) on 03/24/2023 9:06:33 PM  Radiology DG Thoracic Spine 2 View  Result Date: 03/24/2023 CLINICAL DATA:  fall, EXAM: THORACIC SPINE 2 VIEWS; LUMBAR SPINE - COMPLETE 4+ VIEW COMPARISON:  None Available. FINDINGS: Limited evaluation due to overlapping osseous structures and overlying soft tissues. Multilevel severe degenerative changes of the spine. Multilevel thoracic vertebral body height loss with almost complete flattening of the T12 vertebral body. Multilevel lumbar vertebral body height loss. There is no evidence of thoracolumbar acute displaced spine fracture. In saturated kyphotic curvature at the T12 level. Grade 1 anterolisthesis of L4 on L5. Multilevel intervertebral disc space narrowing. No other significant bone abnormalities are identified. IMPRESSION: Multilevel likely chronic vertebral body height loss with almost complete flattening of the T12 level. Severe multilevel degenerative changes of the spine. Limited evaluation due to overlapping osseous structures and overlying soft tissues. Recommend cross-sectional imaging for further evaluation if concern for acute injury. Electronically Signed   By: Tish Frederickson M.D.   On: 03/24/2023 22:46   DG Lumbar Spine Complete  Result Date: 03/24/2023 CLINICAL DATA:  fall, EXAM: THORACIC SPINE 2 VIEWS; LUMBAR SPINE - COMPLETE 4+ VIEW COMPARISON:  None Available. FINDINGS: Limited evaluation due to overlapping osseous structures and overlying soft tissues. Multilevel severe degenerative changes of the spine. Multilevel thoracic vertebral  body height loss with almost complete flattening of the T12 vertebral body. Multilevel lumbar vertebral body height loss. There is no evidence of thoracolumbar acute displaced spine fracture. In saturated kyphotic curvature at the T12 level. Grade 1 anterolisthesis of L4 on L5. Multilevel intervertebral disc space narrowing. No other significant bone abnormalities are identified. IMPRESSION: Multilevel likely chronic vertebral body height loss with almost complete flattening of the T12 level. Severe multilevel degenerative changes of the spine. Limited evaluation due to overlapping osseous structures and overlying soft tissues. Recommend cross-sectional imaging for further evaluation if concern for acute injury. Electronically Signed   By: Tish Frederickson M.D.   On: 03/24/2023 22:46   CT Head Wo Contrast  Result Date: 03/24/2023 CLINICAL DATA:  Head and neck trauma and pain, on blood thinner EXAM: CT HEAD WITHOUT CONTRAST CT CERVICAL SPINE WITHOUT CONTRAST TECHNIQUE: Multidetector CT imaging of the head and cervical spine was performed following the standard protocol without intravenous contrast. Multiplanar CT image reconstructions of the cervical spine were also generated. RADIATION DOSE REDUCTION: This exam was performed according to the departmental dose-optimization program which includes automated exposure control, adjustment of the mA and/or kV according to patient size and/or use of iterative reconstruction technique. COMPARISON:  08/02/2022 CT head and cervical spine FINDINGS: CT HEAD FINDINGS Brain: No evidence of acute infarct, hemorrhage, mass, mass effect, or midline shift. No hydrocephalus or extra-axial fluid collection. Age related cerebral atrophy. Periventricular white matter changes, likely the sequela of chronic small vessel ischemic disease. Lacunar infarcts in the bilateral basal ganglia. Vascular: No hyperdense vessel. Skull: Negative for fracture or focal lesion. Sinuses/Orbits: No acute  finding. Status post bilateral lens replacements. Other: The mastoid air cells are well aerated. CT CERVICAL SPINE FINDINGS Alignment: No traumatic listhesis. Skull base and vertebrae: No acute fracture or suspicious osseous lesion. Chronic compression deformity of C5, unchanged. Soft tissues and spinal canal: No prevertebral fluid or swelling. No visible canal hematoma. Disc levels: Degenerative changes in the cervical spine.Moderate spinal canal stenosis at C4-C5 and C6-C7. Upper chest: No focal pulmonary opacity or  pleural effusion. IMPRESSION: 1. No acute intracranial process. 2. No acute fracture or traumatic listhesis in the cervical spine. Electronically Signed   By: Wiliam Ke M.D.   On: 03/24/2023 22:24   CT Cervical Spine Wo Contrast  Result Date: 03/24/2023 CLINICAL DATA:  Head and neck trauma and pain, on blood thinner EXAM: CT HEAD WITHOUT CONTRAST CT CERVICAL SPINE WITHOUT CONTRAST TECHNIQUE: Multidetector CT imaging of the head and cervical spine was performed following the standard protocol without intravenous contrast. Multiplanar CT image reconstructions of the cervical spine were also generated. RADIATION DOSE REDUCTION: This exam was performed according to the departmental dose-optimization program which includes automated exposure control, adjustment of the mA and/or kV according to patient size and/or use of iterative reconstruction technique. COMPARISON:  08/02/2022 CT head and cervical spine FINDINGS: CT HEAD FINDINGS Brain: No evidence of acute infarct, hemorrhage, mass, mass effect, or midline shift. No hydrocephalus or extra-axial fluid collection. Age related cerebral atrophy. Periventricular white matter changes, likely the sequela of chronic small vessel ischemic disease. Lacunar infarcts in the bilateral basal ganglia. Vascular: No hyperdense vessel. Skull: Negative for fracture or focal lesion. Sinuses/Orbits: No acute finding. Status post bilateral lens replacements. Other:  The mastoid air cells are well aerated. CT CERVICAL SPINE FINDINGS Alignment: No traumatic listhesis. Skull base and vertebrae: No acute fracture or suspicious osseous lesion. Chronic compression deformity of C5, unchanged. Soft tissues and spinal canal: No prevertebral fluid or swelling. No visible canal hematoma. Disc levels: Degenerative changes in the cervical spine.Moderate spinal canal stenosis at C4-C5 and C6-C7. Upper chest: No focal pulmonary opacity or pleural effusion. IMPRESSION: 1. No acute intracranial process. 2. No acute fracture or traumatic listhesis in the cervical spine. Electronically Signed   By: Wiliam Ke M.D.   On: 03/24/2023 22:24    Procedures Procedures  {Document cardiac monitor, telemetry assessment procedure when appropriate:1}  Medications Ordered in ED Medications  morphine (PF) 4 MG/ML injection 4 mg (has no administration in time range)    ED Course/ Medical Decision Making/ A&P Clinical Course as of 03/24/23 2335  [facility] Mar 24, 2023  2233 CBC(!) Hemoglobin stable. [JK]  2233 Basic metabolic panel(!) Metabolic panel normal [JK]  2233 Head CT and C-spine CT without acute abnormalities [JK]  2257 Thoracic spine film shows multi level vertebral body height loss with flattening of the T12 level severe degenerative changes in the spine [JK]    Clinical Course User Index [JK] Linwood Dibbles, MD   {   Click here for ABCD2, HEART and other calculatorsREFRESH Note before signing :1}                              Medical Decision Making Problems Addressed: Compression fracture of body of thoracic vertebra [facility]): acute illness or injury that poses a threat to life or bodily functions Fall, initial encounter: acute illness or injury that poses a threat to life or bodily functions  Amount and/or Complexity of Data Reviewed Labs: ordered. Decision-making details documented in ED Course. Radiology: ordered.  Risk Prescription drug management.   Patient  presented to the ED for evaluation of fall head injury.  Patient states he also has been having worsening back pain and weakness.  Increasing difficulty ambulating.  Patient states it is hard for him to stand.  ED workup without signs of serious head injury.  Patient's laboratory tests were unremarkable.  No signs of dehydration or anemia.  X-rays however  show severe degenerative changes and a significant compression fracture of T12 .  I suspect the patient's difficulty with ambulation was related to that.  Unclear if this is an acute fracture versus old.  With his complaints of weakness will order MRI of thoracic and lumbar spine.  Patient however has a pacemaker and will not be able to get this done until the morning.  I will consult the medical service for admission and further evaluation.  {Document critical care time when appropriate:1} {Document review of labs and clinical decision tools ie heart score, Chads2Vasc2 etc:1}  {Document your independent review of radiology images, and any outside records:1} {Document your discussion with family members, caretakers, and with consultants:1} {Document social determinants of health affecting pt's care:1} {Document your decision making why or why not admission, treatments were needed:1} Final Clinical Impression(s) / ED Diagnoses Final diagnoses:  Compression fracture of body of thoracic vertebra (HCC)  Fall, initial encounter    Rx / DC Orders ED Discharge Orders     None

## 2023-03-24 NOTE — H&P (Incomplete)
History and Physical    SAVAN CARREAU BJY:[id number] DOB: 13-Apr-1931 DOA: 03/24/2023  PCP: Charlane Ferretti, DO   Patient coming from: Home   Chief Complaint:  Chief Complaint  Patient presents with  . Fall   ED TRIAGE note:  HPI:  HASTINGS BUERKLE is a 89 y.o. male with medical history significant of of chronic heart block status post pacemaker, Parkison like syndrome, prostate cancer, seizure disorder, DVT on Eliquis, chronic constipation, BPH, chronic lower back pain and iron deficiency anemia presented to emergency department for evaluation for fall.  Patient usually uses walker but he has a wheelchair as well and he has been using that more.  He was transferring from wheelchair to the commode and ended up falling and hitting his head.  Patient also has sustained a small skin tear to his elbow.  Patient denies any headache and confusion.  Denies any chest pain, palpitation, neck pain, generalized weakness and numbness.  Denies any fever and chill. In the ED patient reported that he has lower back pain that has been progressed and he is afraid to walk due to back pain.   ED Course:  I at presentation to ED found to have low blood pressure 94/61 which has been improved 215/73, tachypneic 24, heart rate 84 and O2 sat 97% room air. BMP showing slightly low sodium 134 otherwise unremarkable.  CBC unremarkable with a stable H&H 11 and 33. EKG showed sinus rhythm heart rate 81, prolonged PR interval.  There is no ST and T wave abnormality.  X-ray thoracic and lumbar spine spine showed: Multilevel likely chronic vertebral body height loss with almost complete flattening of the T12 level. Severe multilevel degenerative changes of the spine. Limited evaluation due to overlapping osseous structures and overlying soft tissues. Recommend cross-sectional imaging for further evaluation if concern for acute injury.  Pending MRI of the lumbar and thoracic spine. CT head no acute intracranial  process.  CT cervical spine no acute fracture and traumatic listhesis of cervical spine.  MRI of the lumbar and thoracic spine has been ordered given patient has a pacemaker it will not be able to done until morning.  Hospitalist has been contacted for further evaluation of generalized weakness, fall and lower back pain.   Review of Systems:  ROS  Past Medical History:  Diagnosis Date  . Bradycardia 06/02/2022   Symptomatic bradycardia now status post pacemaker after syncope.  . Colon polyps   . Diverticulosis   . Encephalopathy acute 06/02/2022  . Hypothermia 06/02/2022  . Pacemaker 05/2022   Due to complete heart block  . Prostate cancer [facility]) 2015   radiation finished Nov 2015  . Seizures (HCC) 07/2016  . Sepsis (HCC) 06/02/2022  . Unresponsiveness 07/28/2016    Past Surgical History:  Procedure Laterality Date  . colonscopy    . INGUINAL HERNIA REPAIR    . KNEE SURGERY    . PACEMAKER IMPLANT N/A 06/04/2022   Procedure: PACEMAKER IMPLANT;  Surgeon: Marinus Maw, MD;  Location: [facility] INVASIVE CV LAB;  Service: Cardiovascular;  Laterality: N/A;  . PROSTATE BIOPSY    . TEMPORARY PACEMAKER N/A 06/03/2022   Procedure: TEMPORARY PACEMAKER;  Surgeon: Dolores Patty, MD;  Location: [facility] INVASIVE CV LAB;  Service: Cardiovascular;  Laterality: N/A;     reports that he has never smoked. He has never used smokeless tobacco. He reports current alcohol use of about 7.0 standard drinks of alcohol per week. He reports that he does not use drugs.  Allergies  Allergen Reactions  . Tape Other (See Comments)    Skin tearing     Family History  Problem Relation Age of Onset  . Alcoholism Father   . Cancer Neg Hx   . Colon cancer Neg Hx     Prior to Admission medications   Medication Sig Start Date End Date Taking? Authorizing Provider  ALPRAZolam Prudy Feeler) 0.5 MG tablet Take 0.5 mg by mouth at bedtime as needed. 10/28/20   [provider]  apixaban (ELIQUIS) 2.5 MG TABS  tablet Take 1 tablet (2.5 mg total) by mouth 2 (two) times daily. Resume from 06/10/22 08/07/22   Marinda Elk, MD  ARTIFICIAL TEAR OP Place 1 drop into both eyes daily as needed (dry eye).    [provider]  cholecalciferol (VITAMIN D) 1000 UNITS tablet Take 2,000 Units by mouth daily.     [provider]  desoximetasone (TOPICORT) 0.25 % cream Apply 1 Application topically at bedtime. 06/17/19   [provider]  divalproex (DEPAKOTE ER) 250 MG 24 hr tablet Take 3 tablets (750 mg total) by mouth at bedtime. 12/27/22   Ihor Austin, NP  divalproex (DEPAKOTE ER) 500 MG 24 hr tablet TAKE 1 TABLET (500 MG TOTAL) BY MOUTH DAILY. 03/14/23   Ihor Austin, NP  Ferrous Sulfate (IRON PO) Take 1 tablet by mouth daily.    [provider]  folic acid (FOLVITE) 1 MG tablet Take 1 tablet (1 mg total) by mouth daily. 06/10/22   Glade Lloyd, MD  furosemide (LASIX) 40 MG tablet Take 40 mg by mouth daily.    [provider]  lactulose (CHRONULAC) 10 GM/15ML solution Take 15 mLs (10 g total) by mouth 2 (two) times daily. 06/09/22   Glade Lloyd, MD  mometasone (ELOCON) 0.1 % ointment Apply 1 application topically 2 (two) times daily as needed (rash).  06/23/19   [provider]  Multiple Vitamin (MULTIVITAMIN) tablet Take 1 tablet by mouth daily.    [provider]  mupirocin ointment (BACTROBAN) 2 % Apply 1 Application topically 2 (two) times daily. 07/09/22   [provider]  omeprazole (PRILOSEC) 20 MG capsule Take 20 mg by mouth daily. 12/29/18   [provider]  tamsulosin (FLOMAX) 0.4 MG CAPS capsule TAKE 1 CAPSULE (0.4 MG TOTAL) BY MOUTH DAILY AFTER SUPPER. Patient taking differently: Take 0.4 mg by mouth daily. 03/21/16   Margaretmary Dys, MD  topiramate (TOPAMAX) 50 MG tablet Take 1 tablet (50 mg total) by mouth at bedtime. 12/27/22   Ihor Austin, NP  traMADol (ULTRAM) 50 MG tablet Take 1 tablet (50 mg total) by mouth  every 6 (six) hours as needed for moderate pain. 08/06/22   Marinda Elk, MD  traZODone (DESYREL) 50 MG tablet Take 50 mg by mouth at bedtime. 12/13/20   [provider]     Physical Exam: Vitals:   03/24/23 2100 03/24/23 2200 03/24/23 2215 03/24/23 2315  BP: 94/61 111/64 133/72 115/73  Pulse: 82 77 87 82  Resp: (!) 25 17 (!) 36 (!) 24  Temp:      TempSrc:      SpO2: 92% 99% 94% 95%    Physical Exam   Labs on Admission: I have personally reviewed following labs and imaging studies  CBC: Recent Labs  Lab 03/24/23 2103  WBC 5.8  HGB 11.0*  HCT 33.1*  MCV 97.6  PLT 155   Basic Metabolic Panel: Recent Labs  Lab 03/24/23 2103  NA 132*  K 5.1  CL 99  CO2 22  GLUCOSE 139*  BUN 19  CREATININE 0.76  CALCIUM 9.2   GFR: CrCl cannot be calculated (Unknown ideal weight.). Liver Function Tests: No results for input(s): "AST", "ALT", "ALKPHOS", "BILITOT", "PROT", "ALBUMIN" in the last 168 hours. No results for input(s): "LIPASE", "AMYLASE" in the last 168 hours. No results for input(s): "AMMONIA" in the last 168 hours. Coagulation Profile: No results for input(s): "INR", "PROTIME" in the last 168 hours. Cardiac Enzymes: No results for input(s): "CKTOTAL", "CKMB", "CKMBINDEX", "TROPONINI", "TROPONINIHS" in the last 168 hours. BNP (last 3 results) No results for input(s): "BNP" in the last 8760 hours. HbA1C: No results for input(s): "HGBA1C" in the last 72 hours. CBG: No results for input(s): "GLUCAP" in the last 168 hours. Lipid Profile: No results for input(s): "CHOL", "HDL", "LDLCALC", "TRIG", "CHOLHDL", "LDLDIRECT" in the last 72 hours. Thyroid Function Tests: No results for input(s): "TSH", "T4TOTAL", "FREET4", "T3FREE", "THYROIDAB" in the last 72 hours. Anemia Panel: No results for input(s): "VITAMINB12", "FOLATE", "FERRITIN", "TIBC", "IRON", "RETICCTPCT" in the last 72 hours. Urine analysis:    Component Value Date/Time   COLORURINE STRAW (A)  08/02/2022 0845   APPEARANCEUR CLEAR 08/02/2022 0845   LABSPEC 1.013 08/02/2022 0845   PHURINE 8.0 08/02/2022 0845   GLUCOSEU NEGATIVE 08/02/2022 0845   HGBUR SMALL (A) 08/02/2022 0845   BILIRUBINUR NEGATIVE 08/02/2022 0845   KETONESUR NEGATIVE 08/02/2022 0845   PROTEINUR NEGATIVE 08/02/2022 0845   NITRITE NEGATIVE 08/02/2022 0845   LEUKOCYTESUR NEGATIVE 08/02/2022 0845    Radiological Exams on Admission: I have personally reviewed images DG Thoracic Spine 2 View  Result Date: 03/24/2023 CLINICAL DATA:  fall, EXAM: THORACIC SPINE 2 VIEWS; LUMBAR SPINE - COMPLETE 4+ VIEW COMPARISON:  None Available. FINDINGS: Limited evaluation due to overlapping osseous structures and overlying soft tissues. Multilevel severe degenerative changes of the spine. Multilevel thoracic vertebral body height loss with almost complete flattening of the T12 vertebral body. Multilevel lumbar vertebral body height loss. There is no evidence of thoracolumbar acute displaced spine fracture. In saturated kyphotic curvature at the T12 level. Grade 1 anterolisthesis of L4 on L5. Multilevel intervertebral disc space narrowing. No other significant bone abnormalities are identified. IMPRESSION: Multilevel likely chronic vertebral body height loss with almost complete flattening of the T12 level. Severe multilevel degenerative changes of the spine. Limited evaluation due to overlapping osseous structures and overlying soft tissues. Recommend cross-sectional imaging for further evaluation if concern for acute injury. Electronically Signed   By: Tish Frederickson M.D.   On: 03/24/2023 22:46   DG Lumbar Spine Complete  Result Date: 03/24/2023 CLINICAL DATA:  fall, EXAM: THORACIC SPINE 2 VIEWS; LUMBAR SPINE - COMPLETE 4+ VIEW COMPARISON:  None Available. FINDINGS: Limited evaluation due to overlapping osseous structures and overlying soft tissues. Multilevel severe degenerative changes of the spine. Multilevel thoracic vertebral body  height loss with almost complete flattening of the T12 vertebral body. Multilevel lumbar vertebral body height loss. There is no evidence of thoracolumbar acute displaced spine fracture. In saturated kyphotic curvature at the T12 level. Grade 1 anterolisthesis of L4 on L5. Multilevel intervertebral disc space narrowing. No other significant bone abnormalities are identified. IMPRESSION: Multilevel likely chronic vertebral body height loss with almost complete flattening of the T12 level. Severe multilevel degenerative changes of the spine. Limited evaluation due to overlapping osseous structures and overlying soft tissues. Recommend cross-sectional imaging for further evaluation if concern for acute injury. Electronically Signed   By: Normajean Glasgow.D.  On: 03/24/2023 22:46   CT Head Wo Contrast  Result Date: 03/24/2023 CLINICAL DATA:  Head and neck trauma and pain, on blood thinner EXAM: CT HEAD WITHOUT CONTRAST CT CERVICAL SPINE WITHOUT CONTRAST TECHNIQUE: Multidetector CT imaging of the head and cervical spine was performed following the standard protocol without intravenous contrast. Multiplanar CT image reconstructions of the cervical spine were also generated. RADIATION DOSE REDUCTION: This exam was performed according to the departmental dose-optimization program which includes automated exposure control, adjustment of the mA and/or kV according to patient size and/or use of iterative reconstruction technique. COMPARISON:  08/02/2022 CT head and cervical spine FINDINGS: CT HEAD FINDINGS Brain: No evidence of acute infarct, hemorrhage, mass, mass effect, or midline shift. No hydrocephalus or extra-axial fluid collection. Age related cerebral atrophy. Periventricular white matter changes, likely the sequela of chronic small vessel ischemic disease. Lacunar infarcts in the bilateral basal ganglia. Vascular: No hyperdense vessel. Skull: Negative for fracture or focal lesion. Sinuses/Orbits: No acute  finding. Status post bilateral lens replacements. Other: The mastoid air cells are well aerated. CT CERVICAL SPINE FINDINGS Alignment: No traumatic listhesis. Skull base and vertebrae: No acute fracture or suspicious osseous lesion. Chronic compression deformity of C5, unchanged. Soft tissues and spinal canal: No prevertebral fluid or swelling. No visible canal hematoma. Disc levels: Degenerative changes in the cervical spine.Moderate spinal canal stenosis at C4-C5 and C6-C7. Upper chest: No focal pulmonary opacity or pleural effusion. IMPRESSION: 1. No acute intracranial process. 2. No acute fracture or traumatic listhesis in the cervical spine. Electronically Signed   By: Wiliam Ke M.D.   On: 03/24/2023 22:24   CT Cervical Spine Wo Contrast  Result Date: 03/24/2023 CLINICAL DATA:  Head and neck trauma and pain, on blood thinner EXAM: CT HEAD WITHOUT CONTRAST CT CERVICAL SPINE WITHOUT CONTRAST TECHNIQUE: Multidetector CT imaging of the head and cervical spine was performed following the standard protocol without intravenous contrast. Multiplanar CT image reconstructions of the cervical spine were also generated. RADIATION DOSE REDUCTION: This exam was performed according to the departmental dose-optimization program which includes automated exposure control, adjustment of the mA and/or kV according to patient size and/or use of iterative reconstruction technique. COMPARISON:  08/02/2022 CT head and cervical spine FINDINGS: CT HEAD FINDINGS Brain: No evidence of acute infarct, hemorrhage, mass, mass effect, or midline shift. No hydrocephalus or extra-axial fluid collection. Age related cerebral atrophy. Periventricular white matter changes, likely the sequela of chronic small vessel ischemic disease. Lacunar infarcts in the bilateral basal ganglia. Vascular: No hyperdense vessel. Skull: Negative for fracture or focal lesion. Sinuses/Orbits: No acute finding. Status post bilateral lens replacements. Other:  The mastoid air cells are well aerated. CT CERVICAL SPINE FINDINGS Alignment: No traumatic listhesis. Skull base and vertebrae: No acute fracture or suspicious osseous lesion. Chronic compression deformity of C5, unchanged. Soft tissues and spinal canal: No prevertebral fluid or swelling. No visible canal hematoma. Disc levels: Degenerative changes in the cervical spine.Moderate spinal canal stenosis at C4-C5 and C6-C7. Upper chest: No focal pulmonary opacity or pleural effusion. IMPRESSION: 1. No acute intracranial process. 2. No acute fracture or traumatic listhesis in the cervical spine. Electronically Signed   By: Wiliam Ke M.D.   On: 03/24/2023 22:24    EKG: My personal interpretation of EKG shows: ***    Assessment/Plan: Principal Problem:   Fall at home, initial encounter Active Problems:   History of T12 compression fracture (HCC)   Seizure disorder (HCC)   Chronic lower back pain   History  of heart block   Parkinsonian features   Unstable gait   History of DVT (deep vein thrombosis)   BPH (benign prostatic hyperplasia)   Complex partial seizure (HCC)   S/P placement of cardiac pacemaker   Chronic anemia    Assessment and Plan: Mechanical fall Chronic lower back pain History of chronic unstable gait -Olding Xanax and trazodone as patient coming with fall and unsteady gait.  History of Parkinsonian features and unstable gait  History of complex partial seizure  History of DVT on Eliquis  Complete heart block status post pacemaker placement  Essential hypertension  Generalized anxiety disorder - Hold G Xanax as patient coming with fall and unsteady gait.  Anemia of chronic disease - Continue oral iron and folic acid supplement.  BPH - Continue Flomax  Insomnia - Holding trazodone as patient has history of unsteady gait and coming with fall.   DVT prophylaxis:  {Blank single:19197::"Lovenox","SQ Heparin","IV heparin  gtts","Xarelto","Eliquis","Coumadin","SCDs","***"} Code Status:  {Blank single:19197::"Full Code","DNR with Intubation","DNR/DNI(Do NOT Intubate)","Comfort Care","***"} Diet:  Family Communication:  *** Family was present at bedside, at the time of interview.  Opportunity was given to ask question and all questions were answered satisfactorily.  Disposition Plan:  ***  Consults:  ***  Admission status:   {Blank single:19197::"Observation","Inpatient"}, {Blank single:19197::"Med-Surg","Telemetry bed","Step Down Unit"}  Severity of Illness: {Observation/Inpatient:21159}    Tereasa Coop, MD [facility]  How to contact the [facility] Attending or Consulting provider 7A - 7P or covering provider during after hours 7P -7A, for this patient.  Check the care team in [facility] and look for a) attending/consulting TR[facility] provider listed and b) the [facility] team listed Log into [email or web address] and use [place] universal password to access. If you do not have the password, please contact the hospital operator. Locate the [facility] provider you are looking for under [facility] and page to a number that you can be directly reached. If you still have difficulty reaching the provider, please page the [facility] (Director on Call) for the Hospitalists listed on amion for assistance.  03/24/2023, 11:55 PM

## 2023-03-25 ENCOUNTER — Other Ambulatory Visit (HOSPITAL_COMMUNITY): Payer: Medicare Other

## 2023-03-25 ENCOUNTER — Inpatient Hospital Stay (HOSPITAL_COMMUNITY): Payer: Medicare Other

## 2023-03-25 DIAGNOSIS — F411 Generalized anxiety disorder: Secondary | ICD-10-CM

## 2023-03-25 DIAGNOSIS — R2681 Unsteadiness on feet: Secondary | ICD-10-CM | POA: Diagnosis not present

## 2023-03-25 DIAGNOSIS — S22080A Wedge compression fracture of T11-T12 vertebra, initial encounter for closed fracture: Secondary | ICD-10-CM

## 2023-03-25 DIAGNOSIS — M545 Low back pain, unspecified: Secondary | ICD-10-CM | POA: Diagnosis not present

## 2023-03-25 DIAGNOSIS — G4700 Insomnia, unspecified: Secondary | ICD-10-CM

## 2023-03-25 DIAGNOSIS — W19XXXA Unspecified fall, initial encounter: Secondary | ICD-10-CM | POA: Diagnosis not present

## 2023-03-25 DIAGNOSIS — E871 Hypo-osmolality and hyponatremia: Secondary | ICD-10-CM

## 2023-03-25 LAB — URINALYSIS, ROUTINE W REFLEX MICROSCOPIC
Bilirubin Urine: NEGATIVE
Glucose, UA: NEGATIVE mg/dL
Hgb urine dipstick: NEGATIVE
Ketones, ur: NEGATIVE mg/dL
Leukocytes,Ua: NEGATIVE
Nitrite: NEGATIVE
Protein, ur: NEGATIVE mg/dL
Specific Gravity, Urine: 1.008 (ref 1.005–1.030)
pH: 7 (ref 5.0–8.0)

## 2023-03-25 LAB — CBC
HCT: 33.7 % — ABNORMAL LOW (ref 39.0–52.0)
Hemoglobin: 11 g/dL — ABNORMAL LOW (ref 13.0–17.0)
MCH: 31.7 pg (ref 26.0–34.0)
MCHC: 32.6 g/dL (ref 30.0–36.0)
MCV: 97.1 fL (ref 80.0–100.0)
Platelets: 177 10*3/uL (ref 150–400)
RBC: 3.47 MIL/uL — ABNORMAL LOW (ref 4.22–5.81)
RDW: 13.5 % (ref 11.5–15.5)
WBC: 5.1 10*3/uL (ref 4.0–10.5)
nRBC: 0 % (ref 0.0–0.2)

## 2023-03-25 LAB — OSMOLALITY, URINE: Osmolality, Ur: 271 mosm/kg — ABNORMAL LOW (ref 300–900)

## 2023-03-25 LAB — OSMOLALITY: Osmolality: 288 mosm/kg (ref 275–295)

## 2023-03-25 LAB — SODIUM, URINE, RANDOM: Sodium, Ur: 53 mmol/L

## 2023-03-25 MED ORDER — INFLUENZA VAC A&B SURF ANT ADJ 0.5 ML IM SUSY
0.5000 mL | PREFILLED_SYRINGE | INTRAMUSCULAR | Status: AC
  Administered 2023-03-26: 0.5 mL via INTRAMUSCULAR
  Filled 2023-03-25: qty 0.5

## 2023-03-25 MED ORDER — SODIUM CHLORIDE 0.9% FLUSH
10.0000 mL | Freq: Two times a day (BID) | INTRAVENOUS | Status: DC
  Administered 2023-03-25 – 2023-03-29 (×9): 10 mL via INTRAVENOUS

## 2023-03-25 MED ORDER — FUROSEMIDE 20 MG PO TABS: 40.0000 mg | ORAL_TABLET | Freq: Every day | ORAL | Status: DC

## 2023-03-25 NOTE — Evaluation (Signed)
Physical Therapy Evaluation Patient Details Name: Kenneth Bowen MRN: [id number] DOB: January 03, 1931 Today's Date: 03/25/2023  History of Present Illness  Pt is 89 year old presented to [facility] on  03/24/23 for fall and back pain. CT spine negative. MRI pending. PMH - chronic heart block status post pacemaker, Parkison like syndrome, prostate cancer, seizure disorder, right lower extremity DVT on Eliquis, chronic constipation, BPH, chronic lower back pain  Clinical Impression  Pt admitted with above diagnosis and presents to PT with functional limitations due to deficits listed below (See PT problem list). Pt needs skilled PT to maximize independence and safety. Pt presents from his ILF after fall at home. Pt has noticed a general decline in mobility over the past several weeks and has been limited to transfers to w/c and being at w/c level. Has some intermittent hired assistance a couple times of day but now with fall this won't be enough. His wife is also limited with mobility and cannot assist. Patient will benefit from continued inpatient follow up therapy, <3 hours/day           If plan is discharge home, recommend the following: Two people to help with walking and/or transfers;A lot of help with bathing/dressing/bathroom;Assist for transportation   Can travel by private vehicle   No    Equipment Recommendations None recommended by PT  Recommendations for Other Services       Functional Status Assessment Patient has had a recent decline in their functional status and demonstrates the ability to make significant improvements in function in a reasonable and predictable amount of time.     Precautions / Restrictions Precautions Precautions: Fall Restrictions Weight Bearing Restrictions: No      Mobility  Bed Mobility Overal bed mobility: Needs Assistance Bed Mobility: Supine to Sit, Sit to Supine     Supine to sit: Mod assist, +2 for physical assistance Sit to supine: Mod  assist, +2 for physical assistance   General bed mobility comments: Assist to bring legs off of bed, elevate trunk into sitting, and bring hips to EOB. Assist to lower trunk and bring legs back up into the bed.    Transfers Overall transfer level: Needs assistance Equipment used: Rolling walker (2 wheels) Transfers: Sit to/from Stand Sit to Stand: Mod assist, +2 physical assistance           General transfer comment: Assist to power up and for balance to correct posterior lean and get feet under center of gravity. Unable to fully extend knees    Ambulation/Gait             Pre-gait activities: Side stepped up side of stretcher x 2' with mod assist    Stairs            Wheelchair Mobility     Tilt Bed    Modified Rankin (Stroke Patients Only)       Balance Overall balance assessment: Needs assistance Sitting-balance support: Bilateral upper extremity supported, Feet supported Sitting balance-Leahy Scale: Poor   Postural control: Posterior lean Standing balance support: Bilateral upper extremity supported, Reliant on assistive device for balance Standing balance-Leahy Scale: Poor Standing balance comment: Mod assist for static standing with posterior lean                             Pertinent Vitals/Pain Pain Assessment Pain Assessment: Faces Faces Pain Scale: Hurts even more Pain Location: low back with mobility Pain Descriptors / Indicators: Discomfort,  Grimacing Pain Intervention(s): Monitored during session, Repositioned    Home Living Family/patient expects to be discharged to:: Private residence Living Arrangements: Spouse/significant other Available Help at Discharge: Personal care attendant;Available PRN/intermittently Type of Home: Independent living facility Home Access: Level entry       Home Layout: One level Home Equipment: Rollator (4 wheels);Other (comment);Shower seat;Grab bars - tub/shower;Grab bars -  toilet;Wheelchair - manual Additional Comments: Lives at [facility] ILF with elderly wife who also requires assistance. Has private caregivers that come in 2x/day    Prior Function Prior Level of Function : Needs assist       Physical Assist : Mobility (physical);ADLs (physical) Mobility (physical): Bed mobility;Transfers   Mobility Comments: Pt reports recently he has been w/c bound and intermittent assist for transfers and bed mobility ADLs Comments: mentioned a [employer] company that he paid for a few hours in the morning and evening. Stated he had discussed increasing hours but has to pay for this and financially he is unsure if he can do that.  ADL and mobility support     Extremity/Trunk Assessment   Upper Extremity Assessment Upper Extremity Assessment: Defer to OT evaluation    Lower Extremity Assessment Lower Extremity Assessment: Generalized weakness    Cervical / Trunk Assessment Cervical / Trunk Assessment: Kyphotic  Communication   Communication Communication: Hearing impairment  Cognition Arousal: Alert Behavior During Therapy: WFL for tasks assessed/performed Overall Cognitive Status: Within Functional Limits for tasks assessed                                 General Comments: Slowed responses        General Comments      Exercises     Assessment/Plan    PT Assessment Patient needs continued PT services  PT Problem List Decreased strength;Decreased activity tolerance;Decreased balance;Decreased mobility;Pain       PT Treatment Interventions DME instruction;Gait training;Functional mobility training;Therapeutic activities;Therapeutic exercise;Balance training;Patient/family education    PT Goals (Current goals can be found in the Care Plan section)  Acute Rehab PT Goals Patient Stated Goal: go to rehab PT Goal Formulation: With patient Time For Goal Achievement: 04/08/23 Potential to Achieve Goals: Fair    Frequency Min 1X/week      Co-evaluation PT/OT/SLP Co-Evaluation/Treatment: Yes Reason for Co-Treatment: For patient/therapist safety PT goals addressed during session: Mobility/safety with mobility;Balance         AM-PAC PT "6 Clicks" Mobility  Outcome Measure Help needed turning from your back to your side while in a flat bed without using bedrails?: A Lot Help needed moving from lying on your back to sitting on the side of a flat bed without using bedrails?: Total Help needed moving to and from a bed to a chair (including a wheelchair)?: Total Help needed standing up from a chair using your arms (e.g., wheelchair or bedside chair)?: Total Help needed to walk in hospital room?: Total Help needed climbing 3-5 steps with a railing? : Total 6 Click Score: 7    End of Session Equipment Utilized During Treatment: Gait belt Activity Tolerance: Patient limited by pain Patient left: in bed;with call bell/phone within reach   PT Visit Diagnosis: Other abnormalities of gait and mobility (R26.89);Unsteadiness on feet (R26.81);History of falling (Z91.81);Muscle weakness (generalized) (M62.81);Pain Pain - part of body:  (back)    Time: 2956-2130 PT Time Calculation (min) (ACUTE ONLY): 19 min   Charges:   PT Evaluation $PT Eval  Moderate Complexity: 1 Mod   PT General Charges $$ ACUTE PT VISIT: 1 Visit         [facility] PT Acute Rehabilitation Services Office [phone]   Angelina Ok [facility] 03/25/2023, 11:08 AM

## 2023-03-25 NOTE — Hospital Course (Addendum)
Taken from H&P.  Kenneth Bowen is a 89 y.o. male with medical history significant of of chronic heart block status post pacemaker, Parkison like syndrome, prostate cancer, seizure disorder, right lower extremity DVT on Eliquis, chronic constipation, BPH, chronic lower back pain and iron deficiency anemia presented to emergency department for evaluation for fall.   Over the past 30-month patient was becoming progressively weaker and uses wheelchair most of the time.  He fell during transferring from wheelchair to commode and ended up on the right side of his hip.  He was also having progressively worsening chronic back pain.  Patient lives in an independent facility and get some help by caretakers few hours in the morning and evening.  On presentation he has stable vitals, labs mostly unremarkable, except mild hyponatremia at 132. EKG with NSR, prolonged PR interval and nonspecific ST-T abnormalit. CT head and cervical spine with no acute abnormality. DG right hip and pelvis with no acute fractures or dislocation. X-ray of the thoracic and lumbar spine with chronic vertebral body height loss with almost complete flattening of the T12 level, severe multilevel degenerative changes.  MRI of lumbar and thoracic spine was also ordered but unable to obtained yesterday as patient has pacemaker in place.  Admitted with worsening generalized weakness and back pain.  11/11: Vital and labs stable.  PT is recommending SNF  11/12: Remained hemodynamically stable, TOC is working on placement

## 2023-03-25 NOTE — Assessment & Plan Note (Signed)
-   Continue Flomax 

## 2023-03-25 NOTE — Assessment & Plan Note (Signed)
History of parkinsonian feature and unstable gait. -Continue with outpatient follow-up with neurology

## 2023-03-25 NOTE — Progress Notes (Signed)
Progress Note   Patient: Kenneth Bowen HCW:[id number] DOB: November 06, 1930 DOA: 03/24/2023     1 DOS: the patient was seen and examined on 03/25/2023   Brief hospital course: Taken from H&P.  DAVIDMICHAEL SPRINGS is a 89 y.o. male with medical history significant of of chronic heart block status post pacemaker, Parkison like syndrome, prostate cancer, seizure disorder, right lower extremity DVT on Eliquis, chronic constipation, BPH, chronic lower back pain and iron deficiency anemia presented to emergency department for evaluation for fall.   Over the past 70-month patient was becoming progressively weaker and uses wheelchair most of the time.  He fell during transferring from wheelchair to commode and ended up on the right side of his hip.  He was also having progressively worsening chronic back pain.  Patient lives in an independent facility and get some help by caretakers few hours in the morning and evening.  On presentation he has stable vitals, labs mostly unremarkable, except mild hyponatremia at 132. EKG with NSR, prolonged PR interval and nonspecific ST-T abnormalit. CT head and cervical spine with no acute abnormality. DG right hip and pelvis with no acute fractures or dislocation. X-ray of the thoracic and lumbar spine with chronic vertebral body height loss with almost complete flattening of the T12 level, severe multilevel degenerative changes.  MRI of lumbar and thoracic spine was also ordered but unable to obtained yesterday as patient has pacemaker in place.  Admitted with worsening generalized weakness and back pain.  11/11: Vital and labs stable.  PT is recommending SNF     Assessment and Plan: * Fall at home, initial encounter Chronic back pain/history of T12 compression fracture History of unstable gait.  Imaging with no acute fracture or abnormality. Worse in weakness and gait instability, mostly using wheelchair over the past couple of month. PT is recommending  SNF  -TOC consult for SNF placement  Hyponatremia Mild hyponatremia at 132, slightly decreased urine osmolality. Clinically appears dry. -Giving some IV fluid -Monitor sodium  Parkinsonian features History of parkinsonian feature and unstable gait. -Continue with outpatient follow-up with neurology  History of DVT (deep vein thrombosis) -Continue with Eliquis  History of heart block S/p pacemaker in place since August 2024. -No acute concern -Continue to monitor  BPH (benign prostatic hyperplasia) -Continue Flomax  Complex partial seizure (HCC) -Continue home Depakote -Continue Topamax  Normocytic anemia -Continue home iron and folic acid supplement  Insomnia -Home trazodone was held on admission due to unsteady gait  GAD (generalized anxiety disorder) -Home Xanax was held due to unsteady gait    Subjective: Patient was seen and examined today.  Complaining of worsening weakness over the past few month.  Physical Exam: Vitals:   03/25/23 1010 03/25/23 1019 03/25/23 1512 03/25/23 1540  BP: (!) 152/62   135/67  Pulse: 63   67  Resp: (!) 29   (!) 21  Temp:  98.1 F (36.7 C) 98 F (36.7 C)   TempSrc:  Oral Oral   SpO2: 98%   96%  Weight:      Height:       General.  Frail elderly man, in no acute distress. Pulmonary.  Lungs clear bilaterally, normal respiratory effort. CV.  Regular rate and rhythm, no JVD, rub or murmur. Abdomen.  Soft, nontender, nondistended, BS positive. CNS.  Alert and oriented .  No focal neurologic deficit. Extremities.  No edema, no cyanosis, pulses intact and symmetrical.  Data Reviewed: Prior data reviewed  Family Communication:   Disposition:  Status is: Inpatient Remains inpatient appropriate because: Severity of illness  Planned Discharge Destination: Skilled nursing facility  DVT prophylaxis.  Eliquis Time spent: 45 minutes  This record has been created using Conservation officer, historic buildings. Errors have been sought  and corrected,but may not always be located. Such creation errors do not reflect on the standard of care.   Author: Arnetha Courser, MD 03/25/2023 4:07 PM  For on call review [email or web address].

## 2023-03-25 NOTE — Assessment & Plan Note (Signed)
-  Home Xanax was held due to unsteady gait

## 2023-03-25 NOTE — Assessment & Plan Note (Signed)
-  Continue home iron and folic acid supplement

## 2023-03-25 NOTE — ED Notes (Signed)
PT TAKEN TO MRI

## 2023-03-25 NOTE — Assessment & Plan Note (Signed)
Chronic back pain/history of T12 compression fracture History of unstable gait.  Imaging with no acute fracture or abnormality. Worse in weakness and gait instability, mostly using wheelchair over the past couple of month. PT is recommending SNF  -TOC consult for SNF placement

## 2023-03-25 NOTE — Assessment & Plan Note (Signed)
Mild hyponatremia at 132, slightly decreased urine osmolality. Clinically appears dry. -Giving some IV fluid -Monitor sodium

## 2023-03-25 NOTE — Assessment & Plan Note (Signed)
S/p pacemaker in place since August 2024. -No acute concern -Continue to monitor

## 2023-03-25 NOTE — Evaluation (Signed)
Occupational Therapy Evaluation Patient Details Name: Kenneth Bowen MRN: [id number] DOB: May 20, 1930 Today's Date: 03/25/2023   History of Present Illness 89 y.o. male with medical history significant of of chronic heart block status post pacemaker, Parkison like syndrome, prostate cancer, seizure disorder, right lower extremity DVT on Eliquis, chronic constipation, BPH, chronic lower back pain and iron deficiency anemia presented 10/11 for evaluation for fall.   Clinical Impression   Patient presented with the above diagnosis.  PTA he lives at an ILF with paid PCA assist for ADL, iADL and mobility.  Currently he is needing Mod A of 2 for basic mobility and up to Max A for ADL completion from a sit to stand level.  OT will continue efforts in the acute setting to address deficits, and Patient will benefit from continued inpatient follow up therapy, <3 hours/day        If plan is discharge home, recommend the following: A lot of help with bathing/dressing/bathroom;Two people to help with walking and/or transfers;Assist for transportation    Functional Status Assessment  Patient has had a recent decline in their functional status and demonstrates the ability to make significant improvements in function in a reasonable and predictable amount of time.  Equipment Recommendations  None recommended by OT    Recommendations for Other Services       Precautions / Restrictions Precautions Precautions: Fall Restrictions Weight Bearing Restrictions: No      Mobility Bed Mobility Overal bed mobility: Needs Assistance Bed Mobility: Supine to Sit, Sit to Supine     Supine to sit: Mod assist, +2 for physical assistance Sit to supine: Mod assist, +2 for physical assistance        Transfers Overall transfer level: Needs assistance Equipment used: Rolling walker (2 wheels) Transfers: Sit to/from Stand Sit to Stand: Mod assist, +2 physical assistance                  Balance  Overall balance assessment: Needs assistance Sitting-balance support: Feet unsupported, Bilateral upper extremity supported Sitting balance-Leahy Scale: Poor   Postural control: Posterior lean Standing balance support: Reliant on assistive device for balance Standing balance-Leahy Scale: Poor                             ADL either performed or assessed with clinical judgement   ADL       Grooming: Wash/dry hands;Wash/dry face;Set up;Bed level           Upper Body Dressing : Moderate assistance;Sitting   Lower Body Dressing: Bed level;Maximal assistance   Toilet Transfer: Moderate assistance;+2 for physical assistance   Toileting- Clothing Manipulation and Hygiene: Total assistance;Sit to/from stand               Vision Baseline Vision/History: 1 Wears glasses Patient Visual Report: No change from baseline       Perception Perception: Within Functional Limits       Praxis Praxis: WFL       Pertinent Vitals/Pain Pain Assessment Pain Assessment: Faces Faces Pain Scale: Hurts even more Pain Location: low back with mobility Pain Descriptors / Indicators: Discomfort, Grimacing Pain Intervention(s): Monitored during session     Extremity/Trunk Assessment Upper Extremity Assessment Upper Extremity Assessment: Generalized weakness;Right hand dominant   Lower Extremity Assessment Lower Extremity Assessment: Defer to PT evaluation   Cervical / Trunk Assessment Cervical / Trunk Assessment: Kyphotic   Communication Communication Communication: No apparent difficulties   Cognition Arousal:  Alert Behavior During Therapy: WFL for tasks assessed/performed Overall Cognitive Status: Within Functional Limits for tasks assessed                                 General Comments: Slowed responses     General Comments   VSS on RA    Exercises     Shoulder Instructions      Home Living Family/patient expects to be discharged to::  Assisted living                             Home Equipment: Rollator (4 wheels);Other (comment);Shower seat;Grab bars - tub/shower;Grab bars - toilet   Additional Comments: ILF at [facility]      Prior Functioning/Environment               Mobility Comments: pt reports using a 4 wheel rollator, but lately spending most of the day in a wheelchair.  Uses transport w/c for MD appointments.  States he has "parkinson's features" with no formal diagnosis of parkinson's. Pt reports a history of falling backwards ADLs Comments: mentioned a PCA company that he paid for a few hours in the morning and evening. Stated he had discussed increasing hours but has to pay for this and financially he is unsure if he can do that.  ADL and mobility support        OT Problem List: Decreased strength;Decreased range of motion;Decreased activity tolerance;Impaired balance (sitting and/or standing);Pain      OT Treatment/Interventions: Self-care/ADL training;Therapeutic activities;Patient/family education;Balance training;DME and/or AE instruction    OT Goals(Current goals can be found in the care plan section) Acute Rehab OT Goals Patient Stated Goal: Get stronger before I go home OT Goal Formulation: With patient Time For Goal Achievement: 04/08/23 Potential to Achieve Goals: Good  OT Frequency: Min 1X/week    Co-evaluation              AM-PAC OT "6 Clicks" Daily Activity     Outcome Measure Help from another person eating meals?: None Help from another person taking care of personal grooming?: None Help from another person toileting, which includes using toliet, bedpan, or urinal?: A Lot Help from another person bathing (including washing, rinsing, drying)?: A Lot Help from another person to put on and taking off regular upper body clothing?: A Lot Help from another person to put on and taking off regular lower body clothing?: A Lot 6 Click Score: 16   End of Session  Equipment Utilized During Treatment: Gait belt;Rolling walker (2 wheels) Nurse Communication: Mobility status  Activity Tolerance: Patient tolerated treatment well Patient left: in bed;with call bell/phone within reach  OT Visit Diagnosis: Unsteadiness on feet (R26.81);Muscle weakness (generalized) (M62.81);History of falling (Z91.81)                Time: 0940-1005 OT Time Calculation (min): 25 min Charges:  OT General Charges $OT Visit: 1 Visit OT Evaluation $OT Eval Moderate Complexity: 1 Mod  03/25/2023  RP, OTR/L  Acute Rehabilitation Services  Office:  [phone]   Suzanna Obey 03/25/2023, 10:18 AM

## 2023-03-25 NOTE — Assessment & Plan Note (Signed)
-  Continue home Depakote -Continue Topamax

## 2023-03-25 NOTE — ED Notes (Signed)
MRI tech just called and said the rep will be here at 1330 today to program the pt's pacemaker for MRI.

## 2023-03-25 NOTE — Assessment & Plan Note (Signed)
Continue with Eliquis. ?

## 2023-03-25 NOTE — Assessment & Plan Note (Signed)
-  Home trazodone was held on admission due to unsteady gait

## 2023-03-25 NOTE — Progress Notes (Signed)
Remote pacemaker transmission.   

## 2023-03-25 NOTE — ED Notes (Signed)
ED TO INPATIENT HANDOFF REPORT  ED Nurse Name and Phone #:   S Name/Age/Gender Minta Balsam Cronin 89 y.o. male Room/Bed: 022C/022C  Code Status   Code Status: Limited: Do not attempt resuscitation (DNR) -DNR-LIMITED -Do Not Intubate/DNI   Home/SNF/Other Home Patient oriented to: self, place, time, and situation Is this baseline? Yes   Triage Complete: Triage complete  Chief Complaint Fall at home, initial encounter [W19.XXXA, Y92.009]  Triage Note Pt fell transferring from wheel chair to a toilet, pt reports low back pain, has chronic low back pain and has had weakness for 3 weeks, reports that he normally uses cane but due to the back pain and weakness he has been in a wheel chair, pt has skin tear noted to the elbow. A&O, denies any other injury, c-collar in place on arrival.    Allergies Allergies  Allergen Reactions   Tape Other (See Comments)    Skin tearing     Level of Care/Admitting Diagnosis ED Disposition     ED Disposition  Admit   Condition  --   Comment  Hospital Area: [facility] [[id number]]  Level of Care: Telemetry Cardiac [[id number]]  May admit patient to [facility] or [facility] if equivalent level of care is available:: No  Covid Evaluation: Asymptomatic - no recent exposure (last 10 days) testing not required  Diagnosis: Fall at home, initial encounter [phone]  Admitting Physician: Tereasa Coop [[id number]]  Attending Physician: Tereasa Coop [[id number]]  Certification:: I certify this patient will need inpatient services for at least 2 midnights  Expected Medical Readiness: 03/29/2023          B Medical/Surgery History Past Medical History:  Diagnosis Date   Bradycardia 06/02/2022   Symptomatic bradycardia now status post pacemaker after syncope.   Colon polyps    Diverticulosis    Encephalopathy acute 06/02/2022   Hypothermia 06/02/2022   Pacemaker 05/2022   Due to complete heart block   Prostate cancer [facility]) 2015    radiation finished Nov 2015   Seizures (HCC) 07/2016   Sepsis (HCC) 06/02/2022   Unresponsiveness 07/28/2016   Past Surgical History:  Procedure Laterality Date   colonscopy     INGUINAL HERNIA REPAIR     KNEE SURGERY     PACEMAKER IMPLANT N/A 06/04/2022   Procedure: PACEMAKER IMPLANT;  Surgeon: Marinus Maw, MD;  Location: [facility] INVASIVE CV LAB;  Service: Cardiovascular;  Laterality: N/A;   PROSTATE BIOPSY     TEMPORARY PACEMAKER N/A 06/03/2022   Procedure: TEMPORARY PACEMAKER;  Surgeon: Dolores Patty, MD;  Location: [facility] INVASIVE CV LAB;  Service: Cardiovascular;  Laterality: N/A;     A IV Location/Drains/Wounds Patient Lines/Drains/Airways Status     Active Line/Drains/Airways     Name Placement date Placement time Site Days   Peripheral IV 03/24/23 Anterior;Distal;Left;Upper Arm 03/24/23  2300  Arm  1   Wound / Incision (Open or Dehisced) 06/02/22 Non-pressure wound Elbow Left;Posterior 06/02/22  --  Elbow  296   Wound / Incision (Open or Dehisced) 06/02/22 Skin tear Elbow Posterior;Right 06/02/22  --  Elbow  296   Wound / Incision (Open or Dehisced) 06/02/22 Non-pressure wound Leg Right;Lower;Lateral 06/02/22  --  Leg  296   Wound / Incision (Open or Dehisced) 06/02/22 Non-pressure wound Foot Right;Lateral 06/02/22  --  Foot  296   Wound / Incision (Open or Dehisced) 06/03/22 Skin tear Arm Posterior;Right;Upper 06/03/22  0130  Arm  295  Intake/Output Last 24 hours No intake or output data in the 24 hours ending 03/25/23 0028  Labs/Imaging Results for orders placed or performed during the hospital encounter of 03/24/23 (from the past 48 hour(s))  CBC     Status: Abnormal   Collection Time: 03/24/23  9:03 PM  Result Value Ref Range   WBC 5.8 4.0 - 10.5 K/uL   RBC 3.39 (L) 4.22 - 5.81 MIL/uL   Hemoglobin 11.0 (L) 13.0 - 17.0 g/dL   HCT 02.7 (L) 25.3 - 66.4 %   MCV 97.6 80.0 - 100.0 fL   MCH 32.4 26.0 - 34.0 pg   MCHC 33.2 30.0 - 36.0 g/dL   RDW 40.3  47.4 - 25.9 %   Platelets 155 150 - 400 K/uL   nRBC 0.0 0.0 - 0.2 %    Comment: Performed at [facility], [address], [place], [place] [address]  Basic metabolic panel     Status: Abnormal   Collection Time: 03/24/23  9:03 PM  Result Value Ref Range   Sodium 132 (L) 135 - 145 mmol/L   Potassium 5.1 3.5 - 5.1 mmol/L    Comment: HEMOLYSIS AT THIS LEVEL MAY AFFECT RESULT   Chloride 99 98 - 111 mmol/L   CO2 22 22 - 32 mmol/L   Glucose, Bld 139 (H) 70 - 99 mg/dL    Comment: Glucose reference range applies only to samples taken after fasting for at least 8 hours.   BUN 19 8 - 23 mg/dL   Creatinine, Ser 5.64 0.61 - 1.24 mg/dL   Calcium 9.2 8.9 - 33.2 mg/dL   GFR, Estimated >95 >18 mL/min    Comment: (NOTE) Calculated using the CKD-EPI Creatinine Equation (2021)    Anion gap 11 5 - 15    Comment: Performed at [facility], [address], [place], [place] [address]   DG Thoracic Spine 2 View  Result Date: 03/24/2023 CLINICAL DATA:  fall, EXAM: THORACIC SPINE 2 VIEWS; LUMBAR SPINE - COMPLETE 4+ VIEW COMPARISON:  None Available. FINDINGS: Limited evaluation due to overlapping osseous structures and overlying soft tissues. Multilevel severe degenerative changes of the spine. Multilevel thoracic vertebral body height loss with almost complete flattening of the T12 vertebral body. Multilevel lumbar vertebral body height loss. There is no evidence of thoracolumbar acute displaced spine fracture. In saturated kyphotic curvature at the T12 level. Grade 1 anterolisthesis of L4 on L5. Multilevel intervertebral disc space narrowing. No other significant bone abnormalities are identified. IMPRESSION: Multilevel likely chronic vertebral body height loss with almost complete flattening of the T12 level. Severe multilevel degenerative changes of the spine. Limited evaluation due to overlapping osseous structures and overlying soft tissues. Recommend cross-sectional imaging for further evaluation  if concern for acute injury. Electronically Signed   By: Tish Frederickson M.D.   On: 03/24/2023 22:46   DG Lumbar Spine Complete  Result Date: 03/24/2023 CLINICAL DATA:  fall, EXAM: THORACIC SPINE 2 VIEWS; LUMBAR SPINE - COMPLETE 4+ VIEW COMPARISON:  None Available. FINDINGS: Limited evaluation due to overlapping osseous structures and overlying soft tissues. Multilevel severe degenerative changes of the spine. Multilevel thoracic vertebral body height loss with almost complete flattening of the T12 vertebral body. Multilevel lumbar vertebral body height loss. There is no evidence of thoracolumbar acute displaced spine fracture. In saturated kyphotic curvature at the T12 level. Grade 1 anterolisthesis of L4 on L5. Multilevel intervertebral disc space narrowing. No other significant bone abnormalities are identified. IMPRESSION: Multilevel likely chronic  vertebral body height loss with almost complete flattening of the T12 level. Severe multilevel degenerative changes of the spine. Limited evaluation due to overlapping osseous structures and overlying soft tissues. Recommend cross-sectional imaging for further evaluation if concern for acute injury. Electronically Signed   By: Tish Frederickson M.D.   On: 03/24/2023 22:46   CT Head Wo Contrast  Result Date: 03/24/2023 CLINICAL DATA:  Head and neck trauma and pain, on blood thinner EXAM: CT HEAD WITHOUT CONTRAST CT CERVICAL SPINE WITHOUT CONTRAST TECHNIQUE: Multidetector CT imaging of the head and cervical spine was performed following the standard protocol without intravenous contrast. Multiplanar CT image reconstructions of the cervical spine were also generated. RADIATION DOSE REDUCTION: This exam was performed according to the departmental dose-optimization program which includes automated exposure control, adjustment of the mA and/or kV according to patient size and/or use of iterative reconstruction technique. COMPARISON:  08/02/2022 CT head and cervical  spine FINDINGS: CT HEAD FINDINGS Brain: No evidence of acute infarct, hemorrhage, mass, mass effect, or midline shift. No hydrocephalus or extra-axial fluid collection. Age related cerebral atrophy. Periventricular white matter changes, likely the sequela of chronic small vessel ischemic disease. Lacunar infarcts in the bilateral basal ganglia. Vascular: No hyperdense vessel. Skull: Negative for fracture or focal lesion. Sinuses/Orbits: No acute finding. Status post bilateral lens replacements. Other: The mastoid air cells are well aerated. CT CERVICAL SPINE FINDINGS Alignment: No traumatic listhesis. Skull base and vertebrae: No acute fracture or suspicious osseous lesion. Chronic compression deformity of C5, unchanged. Soft tissues and spinal canal: No prevertebral fluid or swelling. No visible canal hematoma. Disc levels: Degenerative changes in the cervical spine.Moderate spinal canal stenosis at C4-C5 and C6-C7. Upper chest: No focal pulmonary opacity or pleural effusion. IMPRESSION: 1. No acute intracranial process. 2. No acute fracture or traumatic listhesis in the cervical spine. Electronically Signed   By: Wiliam Ke M.D.   On: 03/24/2023 22:24   CT Cervical Spine Wo Contrast  Result Date: 03/24/2023 CLINICAL DATA:  Head and neck trauma and pain, on blood thinner EXAM: CT HEAD WITHOUT CONTRAST CT CERVICAL SPINE WITHOUT CONTRAST TECHNIQUE: Multidetector CT imaging of the head and cervical spine was performed following the standard protocol without intravenous contrast. Multiplanar CT image reconstructions of the cervical spine were also generated. RADIATION DOSE REDUCTION: This exam was performed according to the departmental dose-optimization program which includes automated exposure control, adjustment of the mA and/or kV according to patient size and/or use of iterative reconstruction technique. COMPARISON:  08/02/2022 CT head and cervical spine FINDINGS: CT HEAD FINDINGS Brain: No evidence of  acute infarct, hemorrhage, mass, mass effect, or midline shift. No hydrocephalus or extra-axial fluid collection. Age related cerebral atrophy. Periventricular white matter changes, likely the sequela of chronic small vessel ischemic disease. Lacunar infarcts in the bilateral basal ganglia. Vascular: No hyperdense vessel. Skull: Negative for fracture or focal lesion. Sinuses/Orbits: No acute finding. Status post bilateral lens replacements. Other: The mastoid air cells are well aerated. CT CERVICAL SPINE FINDINGS Alignment: No traumatic listhesis. Skull base and vertebrae: No acute fracture or suspicious osseous lesion. Chronic compression deformity of C5, unchanged. Soft tissues and spinal canal: No prevertebral fluid or swelling. No visible canal hematoma. Disc levels: Degenerative changes in the cervical spine.Moderate spinal canal stenosis at C4-C5 and C6-C7. Upper chest: No focal pulmonary opacity or pleural effusion. IMPRESSION: 1. No acute intracranial process. 2. No acute fracture or traumatic listhesis in the cervical spine. Electronically Signed   By: Elaina Pattee.D.  On: 03/24/2023 22:24    Pending Labs Unresulted Labs (From admission, onward)     Start     Ordered   03/25/23 0500  Comprehensive metabolic panel  Tomorrow morning,   R        03/24/23 2354   03/25/23 0500  CBC  Tomorrow morning,   R        03/24/23 2354            Vitals/Pain Today's Vitals   03/24/23 2215 03/24/23 2315 03/25/23 0015 03/25/23 0027  BP: 133/72 115/73 (!) 105/57   Pulse: 87 82 79   Resp: (!) 36 (!) 24 18   Temp:      TempSrc:      SpO2: 94% 95% 98%   PainSc:    1     Isolation Precautions No active isolations  Medications Medications  traMADol (ULTRAM) tablet 50 mg (has no administration in time range)  lactulose (CHRONULAC) 10 GM/15ML solution 10 g (has no administration in time range)  pantoprazole (PROTONIX) EC tablet 40 mg (has no administration in time range)  tamsulosin (FLOMAX)  capsule 0.4 mg (has no administration in time range)  apixaban (ELIQUIS) tablet 2.5 mg (has no administration in time range)  Iron TABS (has no administration in time range)  folic acid (FOLVITE) tablet 1 mg (has no administration in time range)  divalproex (DEPAKOTE ER) 24 hr tablet 750 mg (has no administration in time range)  divalproex (DEPAKOTE ER) 24 hr tablet 500 mg (has no administration in time range)  topiramate (TOPAMAX) tablet 50 mg (has no administration in time range)  sodium chloride flush (NS) 0.9 % injection 3 mL (3 mLs Intravenous Given 03/25/23 0027)  sodium chloride flush (NS) 0.9 % injection 3 mL (has no administration in time range)  0.9 %  sodium chloride infusion (has no administration in time range)  acetaminophen (TYLENOL) tablet 650 mg (has no administration in time range)    Or  acetaminophen (TYLENOL) suppository 650 mg (has no administration in time range)  ondansetron (ZOFRAN) tablet 4 mg (has no administration in time range)    Or  ondansetron (ZOFRAN) injection 4 mg (has no administration in time range)  furosemide (LASIX) tablet 40 mg (has no administration in time range)  morphine (PF) 4 MG/ML injection 4 mg (4 mg Intravenous Given 03/24/23 2349)    Mobility manual wheelchair     Focused Assessments    R Recommendations: See Admitting Provider Note  Report given to:   Additional Notes:

## 2023-03-26 DIAGNOSIS — W19XXXA Unspecified fall, initial encounter: Secondary | ICD-10-CM | POA: Diagnosis not present

## 2023-03-26 DIAGNOSIS — M545 Low back pain, unspecified: Secondary | ICD-10-CM | POA: Diagnosis not present

## 2023-03-26 DIAGNOSIS — S22080A Wedge compression fracture of T11-T12 vertebra, initial encounter for closed fracture: Secondary | ICD-10-CM | POA: Diagnosis not present

## 2023-03-26 DIAGNOSIS — R2681 Unsteadiness on feet: Secondary | ICD-10-CM | POA: Diagnosis not present

## 2023-03-26 LAB — COMPREHENSIVE METABOLIC PANEL
ALT: 25 U/L (ref 0–44)
AST: 38 U/L (ref 15–41)
Albumin: 3 g/dL — ABNORMAL LOW (ref 3.5–5.0)
Alkaline Phosphatase: 77 U/L (ref 38–126)
Anion gap: 8 (ref 5–15)
BUN: 16 mg/dL (ref 8–23)
CO2: 24 mmol/L (ref 22–32)
Calcium: 9.3 mg/dL (ref 8.9–10.3)
Chloride: 100 mmol/L (ref 98–111)
Creatinine, Ser: 0.82 mg/dL (ref 0.61–1.24)
GFR, Estimated: >60 mL/min (ref >60)
Glucose, Bld: 88 mg/dL (ref 70–99)
Potassium: 4.9 mmol/L (ref 3.5–5.1)
Sodium: 132 mmol/L — ABNORMAL LOW (ref 135–145)
Total Bilirubin: 0.7 mg/dL (ref <1.2)
Total Protein: 6.1 g/dL — ABNORMAL LOW (ref 6.5–8.1)

## 2023-03-26 LAB — BASIC METABOLIC PANEL
Anion gap: 10 (ref 5–15)
BUN: 16 mg/dL (ref 8–23)
CO2: 23 mmol/L (ref 22–32)
Calcium: 9.2 mg/dL (ref 8.9–10.3)
Chloride: 99 mmol/L (ref 98–111)
Creatinine, Ser: 0.85 mg/dL (ref 0.61–1.24)
GFR, Estimated: >60 mL/min (ref >60)
Glucose, Bld: 90 mg/dL (ref 70–99)
Potassium: 4.7 mmol/L (ref 3.5–5.1)
Sodium: 132 mmol/L — ABNORMAL LOW (ref 135–145)

## 2023-03-26 NOTE — TOC Initial Note (Signed)
Transition of Care [facility]) - Initial/Assessment Note    Patient Details  Name: Kenneth Bowen MRN: [id number] Date of Birth: Aug 09, 1930  Transition of [facility]) CM/SW Contact:    Lorri Frederick, LCSW Phone Number: 03/26/2023, 2:34 PM  Clinical Narrative:         1030: pt listed as oriented x3, mittens on, unable to have meaningful conversation about DC plan.   CSW attempted to contact son  Kenneth Bowen, left message.  1400: second attempt to reach son Kenneth Bowen, no answer.  CSW did speak with son Kenneth Bowen, who provides all info.  Pt from independent living at [facility], has [facility] aide come in to assist multiple times per day.  Discussed PT recommendations for SNF, son hoping pt will improve to possibly DC directly back to [facility],  but is agreeable to having referral sent out in hub for SNF.      Son does have some questions about seizure medication, MD notified.  Referral sent out in hub for SNF.    Expected Discharge Plan: Skilled Nursing Facility Barriers to Discharge: SNF Pending bed offer   Patient Goals and CMS Choice     Choice offered to / list presented to : Adult Children (son Kenneth Bowen)      Expected Discharge Plan and Services In-house Referral: Clinical Social Work   Post Acute Care Choice:  (TBD) Living arrangements for the past 2 months: Independent Living Facility [employer] logistics/support/administrative officer)                                      Prior Living Arrangements/Services Living arrangements for the past 2 months: Independent Living Facility [employer] logistics/support/administrative officer) Lives with:: Spouse Patient language and need for interpreter reviewed:: Yes        Need for Family Participation in Patient Care: Yes (Comment) Care giver support system in place?: Yes (comment) Current home services: Homehealth aide Criminal Activity/Legal Involvement Pertinent to Current Situation/Hospitalization: No - Comment as needed  Activities of Daily Living   ADL Screening (condition at time of  admission) Independently performs ADLs?: No Does the patient have a NEW difficulty with bathing/dressing/toileting/self-feeding that is expected to last >3 days?: Yes (Initiates electronic notice to provider for possible OT consult) Does the patient have a NEW difficulty with getting in/out of bed, walking, or climbing stairs that is expected to last >3 days?: Yes (Initiates electronic notice to provider for possible PT consult) Does the patient have a NEW difficulty with communication that is expected to last >3 days?: No Is the patient deaf or have difficulty hearing?: No Does the patient have difficulty seeing, even when wearing glasses/contacts?: No Does the patient have difficulty concentrating, remembering, or making decisions?: No  Permission Sought/Granted                  Emotional Assessment Appearance:: Appears stated age Attitude/Demeanor/Rapport: Engaged Affect (typically observed): Pleasant Orientation: : Oriented to Self, Oriented to  Time, Oriented to Situation      Admission diagnosis:  Fall, initial encounter [W19.XXXA] Fall at home, initial encounter [id number], Y92.009] Compression fracture of body of thoracic vertebra (HCC) [S22.000A] Patient Active Problem List   Diagnosis Date Noted   Insomnia 03/25/2023   GAD (generalized anxiety disorder) 03/25/2023   Hyponatremia 03/25/2023   Fall at home, initial encounter 03/24/2023   Chronic lower back pain 03/24/2023   History of heart block 03/24/2023   Parkinsonian  features 03/24/2023   Unstable gait 03/24/2023   Normocytic anemia 03/24/2023   History of pulmonary embolism 10/13/2022   S/P placement of cardiac pacemaker 09/06/2022   Falls 08/02/2022   History of DVT (deep vein thrombosis) 08/02/2022   History of T12 compression fracture (HCC) 08/02/2022   Pancytopenia (HCC) 08/02/2022   Seizure disorder (HCC) 08/02/2022   Elevated AST (SGOT) 08/02/2022   At risk for aspiration 06/15/2022   Palliative care  encounter 06/15/2022   Acute respiratory failure with hypoxia (HCC) 06/05/2022   Physical deconditioning 06/05/2022   Heart block AV complete (HCC) 06/04/2022   Altered mental status 06/03/2022   Suspected stroke patient last known to be well 2 to 3 hours ago 06/02/2022   Therapeutic drug monitoring 06/10/2017   Complex partial seizure (HCC) 12/05/2016   Syncope 07/29/2016   BPH (benign prostatic hyperplasia) 07/28/2016   Dyslipidemia 07/28/2016   Anxiety    History of prostate cancer    TIA (transient ischemic attack) 07/12/2016   PCP:  Charlane Ferretti, DO Pharmacy:   [facility] [address], [place] - [address] [place] [address] Phone: [phone] Fax: [phone]  [facility] [address] [place] [address] Phone: [phone] Fax: [phone]     Social Determinants of Health (SDOH) Social History: SDOH Screenings   Food Insecurity: No Food Insecurity (03/25/2023)  Housing: Low Risk  (03/25/2023)  Transportation Needs: No Transportation Needs (03/25/2023)  Utilities: Not At Risk (03/25/2023)  Tobacco Use: Low Risk  (03/24/2023)   SDOH Interventions:     Readmission Risk Interventions     No data to display

## 2023-03-26 NOTE — Assessment & Plan Note (Signed)
Chronic back pain/history of T12 compression fracture History of unstable gait.  Imaging with no acute fracture or abnormality. Worse in weakness and gait instability, mostly using wheelchair over the past couple of month. PT is recommending SNF  -TOC consult for SNF placement

## 2023-03-26 NOTE — Progress Notes (Signed)
Progress Note   Patient: Kenneth Bowen:[id number] DOB: 1931/02/01 DOA: 03/24/2023     2 DOS: the patient was seen and examined on 03/26/2023   Brief hospital course: Taken from H&P.  Kenneth Bowen is a 89 y.o. male with medical history significant of of chronic heart block status post pacemaker, Parkison like syndrome, prostate cancer, seizure disorder, right lower extremity DVT on Eliquis, chronic constipation, BPH, chronic lower back pain and iron deficiency anemia presented to emergency department for evaluation for fall.   Over the past 35-month patient was becoming progressively weaker and uses wheelchair most of the time.  He fell during transferring from wheelchair to commode and ended up on the right side of his hip.  He was also having progressively worsening chronic back pain.  Patient lives in an independent facility and get some help by caretakers few hours in the morning and evening.  On presentation he has stable vitals, labs mostly unremarkable, except mild hyponatremia at 132. EKG with NSR, prolonged PR interval and nonspecific ST-T abnormalit. CT head and cervical spine with no acute abnormality. DG right hip and pelvis with no acute fractures or dislocation. X-ray of the thoracic and lumbar spine with chronic vertebral body height loss with almost complete flattening of the T12 level, severe multilevel degenerative changes.  MRI of lumbar and thoracic spine was also ordered but unable to obtained yesterday as patient has pacemaker in place.  Admitted with worsening generalized weakness and back pain.  11/11: Vital and labs stable.  PT is recommending SNF  11/12: Remained hemodynamically stable, TOC is working on placement     Assessment and Plan: * Fall at home, initial encounter Chronic back pain/history of T12 compression fracture History of unstable gait.  Imaging with no acute fracture or abnormality. Worse in weakness and gait instability, mostly  using wheelchair over the past couple of month. PT is recommending SNF  -TOC consult for SNF placement  Hyponatremia Mild hyponatremia at 132, slightly decreased urine osmolality. Clinically appears dry, received some normal saline -Repeat labs pending -Monitor sodium  Parkinsonian features History of parkinsonian feature and unstable gait. -Continue with outpatient follow-up with neurology  History of DVT (deep vein thrombosis) -Continue with Eliquis  History of heart block S/p pacemaker in place since August 2024. -No acute concern -Continue to monitor  BPH (benign prostatic hyperplasia) -Continue Flomax  Complex partial seizure (HCC) -Continue home Depakote -Continue Topamax  Normocytic anemia -Continue home iron and folic acid supplement  Insomnia -Home trazodone was held on admission due to unsteady gait  GAD (generalized anxiety disorder) -Home Xanax was held due to unsteady gait    Subjective: Patient was seen and examined today.  He was eating lunch.  No new concern.  Physical Exam: Vitals:   03/26/23 0355 03/26/23 0433 03/26/23 0803 03/26/23 1114  BP: (!) 105/45 (!) 104/56 (!) 112/52 113/74  Pulse: 74 93 65 78  Resp: 12 19 17 18   Temp: 98.1 F (36.7 C)   98.1 F (36.7 C)  TempSrc: Oral   Oral  SpO2: 92% 92% 92% 95%  Weight:      Height:       General.  Frail elderly man, in no acute distress. Pulmonary.  Lungs clear bilaterally, normal respiratory effort. CV.  Regular rate and rhythm, no JVD, rub or murmur. Abdomen.  Soft, nontender, nondistended, BS positive. CNS.  Alert and oriented .  No focal neurologic deficit. Extremities.  No edema, no cyanosis, pulses intact and symmetrical.  Data Reviewed: Prior data reviewed  Family Communication:   Disposition: Status is: Inpatient Remains inpatient appropriate because: Severity of illness  Planned Discharge Destination: Skilled nursing facility  DVT prophylaxis.  Eliquis Time spent: 40  minutes  This record has been created using Conservation officer, historic buildings. Errors have been sought and corrected,but may not always be located. Such creation errors do not reflect on the standard of care.   Author: Arnetha Courser, MD 03/26/2023 1:53 PM  For on call review [email or web address].

## 2023-03-26 NOTE — Assessment & Plan Note (Signed)
Mild hyponatremia at 132, slightly decreased urine osmolality. Clinically appears dry, received some normal saline -Repeat labs pending -Monitor sodium

## 2023-03-26 NOTE — Progress Notes (Signed)
Pt confused this morning.  MD notified.  Pt attempting to get out of bed to "go upstairs to his room and see his wife".  Pt removing medical equipment.  Unable to orient pt to place.  Safety mitts placed on pt.  Bed in lowest position, bed alarm on, with fall mats at bedside.

## 2023-03-26 NOTE — Plan of Care (Signed)

## 2023-03-26 NOTE — [place] FL2 (Signed)
[place] MEDICAID FL2 LEVEL OF CARE FORM     IDENTIFICATION  Patient Name: Kenneth Bowen Birthdate: 11-19-1930 Sex: male Admission Date (Current Location): 03/24/2023  [facility] and [place] Number:  Producer, television/film/video and Address:  [address]. [facility], [address], [place], [place] [address]      Provider Number: [id number]  Attending Physician Name and Address:  Arnetha Courser, MD  Relative Name and Phone Number:  Kyjuan, Stolley   [phone]    Current Level of Care: Hospital Recommended Level of Care: Skilled Nursing Facility Prior Approval Number:    Date Approved/Denied:   PASRR Number: [id number]  Discharge Plan: SNF    Current Diagnoses: Patient Active Problem List   Diagnosis Date Noted   Insomnia 03/25/2023   GAD (generalized anxiety disorder) 03/25/2023   Hyponatremia 03/25/2023   Fall at home, initial encounter 03/24/2023   Chronic lower back pain 03/24/2023   History of heart block 03/24/2023   Parkinsonian features 03/24/2023   Unstable gait 03/24/2023   Normocytic anemia 03/24/2023   History of pulmonary embolism 10/13/2022   S/P placement of cardiac pacemaker 09/06/2022   Falls 08/02/2022   History of DVT (deep vein thrombosis) 08/02/2022   History of T12 compression fracture (HCC) 08/02/2022   Pancytopenia (HCC) 08/02/2022   Seizure disorder (HCC) 08/02/2022   Elevated AST (SGOT) 08/02/2022   At risk for aspiration 06/15/2022   Palliative care encounter 06/15/2022   Acute respiratory failure with hypoxia (HCC) 06/05/2022   Physical deconditioning 06/05/2022   Heart block AV complete (HCC) 06/04/2022   Altered mental status 06/03/2022   Suspected stroke patient last known to be well 2 to 3 hours ago 06/02/2022   Therapeutic drug monitoring 06/10/2017   Complex partial seizure (HCC) 12/05/2016   Syncope 07/29/2016   BPH (benign prostatic hyperplasia) 07/28/2016   Dyslipidemia 07/28/2016   Anxiety    History of  prostate cancer    TIA (transient ischemic attack) 07/12/2016    Orientation RESPIRATION BLADDER Height & Weight     Self, Time, Situation  Normal External catheter, Incontinent Weight: 176 lb 2.4 oz (79.9 kg) Height:  5\' 6"  (167.6 cm)  BEHAVIORAL SYMPTOMS/MOOD NEUROLOGICAL BOWEL NUTRITION STATUS    Convulsions/Seizures Continent Diet (see discharge summary)  AMBULATORY STATUS COMMUNICATION OF NEEDS Skin   Extensive Assist Verbally Other (Comment) (ecchymosis, redness)                       Personal Care Assistance Level of Assistance  Bathing, Feeding, Dressing Bathing Assistance: Maximum assistance Feeding assistance: Limited assistance Dressing Assistance: Maximum assistance     Functional Limitations Info  Sight, Hearing, Speech Sight Info: Adequate Hearing Info: Adequate Speech Info: Adequate    SPECIAL CARE FACTORS FREQUENCY  PT (By licensed PT), OT (By licensed OT)     PT Frequency: 5x week OT Frequency: 5x week            Contractures Contractures Info: Not present    Additional Factors Info  Code Status, Allergies Code Status Info: DNR Allergies Info: tape           Current Medications (03/26/2023):  This is the current hospital active medication list Current Facility-Administered Medications  Medication Dose Route Frequency Provider Last Rate Last Admin   acetaminophen (TYLENOL) tablet 650 mg  650 mg Oral Q6H PRN Janalyn Shy, Subrina, MD   650 mg at 03/25/23 1010   Or   acetaminophen (TYLENOL) suppository 650 mg  650 mg Rectal Q6H PRN Janalyn Shy, Subrina, MD       apixaban Everlene Balls) tablet 2.5 mg  2.5 mg Oral BID Janalyn Shy, Subrina, MD   2.5 mg at 03/26/23 0816   divalproex (DEPAKOTE ER) 24 hr tablet 750 mg  750 mg Oral QHS Sundil, Subrina, MD   750 mg at 03/25/23 2016   ferrous sulfate tablet 325 mg  325 mg Oral Daily Sundil, Subrina, MD   325 mg at 03/26/23 0816   folic acid (FOLVITE) tablet 1 mg  1 mg Oral Daily Sundil, Subrina, MD   1 mg at 03/26/23  0816   lactulose (CHRONULAC) 10 GM/15ML solution 10 g  10 g Oral BID Janalyn Shy, Subrina, MD   10 g at 03/26/23 0816   ondansetron (ZOFRAN) tablet 4 mg  4 mg Oral Q6H PRN Janalyn Shy, Subrina, MD       Or   ondansetron [facility]) injection 4 mg  4 mg Intravenous Q6H PRN Janalyn Shy, Subrina, MD       pantoprazole (PROTONIX) EC tablet 40 mg  40 mg Oral Daily Sundil, Subrina, MD   40 mg at 03/26/23 0816   sodium chloride flush (NS) 0.9 % injection 10 mL  10 mL Intravenous Q12H Arnetha Courser, MD   10 mL at 03/26/23 0816   tamsulosin (FLOMAX) capsule 0.4 mg  0.4 mg Oral QPC supper Sundil, Subrina, MD   0.4 mg at 03/25/23 1819   topiramate (TOPAMAX) tablet 50 mg  50 mg Oral QHS Sundil, Subrina, MD   50 mg at 03/25/23 2017   traMADol (ULTRAM) tablet 50 mg  50 mg Oral Q6H PRN Tereasa Coop, MD         Discharge Medications: Please see discharge summary for a list of discharge medications.  Relevant Imaging Results:  Relevant Lab Results:   Additional Information SSN: [id number]  Lorri Frederick, LCSW

## 2023-03-26 NOTE — Plan of Care (Signed)

## 2023-03-27 DIAGNOSIS — W19XXXA Unspecified fall, initial encounter: Secondary | ICD-10-CM | POA: Diagnosis not present

## 2023-03-27 DIAGNOSIS — R2681 Unsteadiness on feet: Secondary | ICD-10-CM | POA: Diagnosis not present

## 2023-03-27 DIAGNOSIS — Y92009 Unspecified place in unspecified non-institutional (private) residence as the place of occurrence of the external cause: Secondary | ICD-10-CM | POA: Diagnosis not present

## 2023-03-27 MED ORDER — ALPRAZOLAM 0.5 MG PO TABS: 0.5000 mg | ORAL_TABLET | Freq: Every evening | ORAL | Status: DC | PRN

## 2023-03-27 MED ORDER — TRAZODONE HCL 50 MG PO TABS
50.0000 mg | ORAL_TABLET | Freq: Every day | ORAL | Status: DC
  Administered 2023-03-27 – 2023-03-28 (×2): 50 mg via ORAL
  Filled 2023-03-27 (×2): qty 1

## 2023-03-27 NOTE — Progress Notes (Signed)
Physical Therapy Treatment Patient Details Name: Kenneth Bowen MRN: [id number] DOB: 15-Apr-1931 Today's Date: 03/27/2023   History of Present Illness Pt is 89 year old presented to [facility] on  03/24/[facility] for fall and back pain. CT spine negative. MRI showed chronic T10-L5 compression fractures, but no acute fractures. PMH - chronic heart block status post pacemaker, Parkison like syndrome, prostate cancer, seizure disorder, right lower extremity DVT on Eliquis, chronic constipation, BPH, chronic lower back pain    PT Comments  Focused session on trying to reduce pt's posterior lean when standing, as it is impacting his independence and safety with standing mobility. Pt does endorse a fear of falling as he has fallen recently, likely resulting in his posterior lean response. He required modA to transfer to stand from the chair to a RW but quickly progressed to minA-CGA when transferring to stand utilizing the stedy, likely due to the increased stability and sense of security the stedy provides. He still required cues to bring his abdomen towards the anterior bar of the stedy and keep his toes in contact with the ground to reduce his posterior lean though. Will continue to follow acutely.    If plan is discharge home, recommend the following: Two people to help with walking and/or transfers;A lot of help with bathing/dressing/bathroom;Assist for transportation;Assistance with cooking/housework   Can travel by private vehicle     No  Equipment Recommendations  Other (comment) (defer to next venue of care)    Recommendations for Other Services       Precautions / Restrictions Precautions Precautions: Fall Restrictions Weight Bearing Restrictions: No     Mobility  Bed Mobility               General bed mobility comments: Pt up in recliner at start and end of session    Transfers Overall transfer level: Needs assistance Equipment used: Rolling walker (2 wheels), Ambulation equipment  used Transfers: Sit to/from Stand Sit to Stand: Mod assist, Contact guard assist, Min assist           General transfer comment: Pt required modA to shift his weight anteriorly and power him up to stand from recliner. However, pt only needed minA to power up to stand from recliner to stedy. CGA needed to transfer to stand from stedy flaps x15 reps.    Ambulation/Gait Ambulation/Gait assistance: Mod assist Gait Distance (Feet): 1 Feet Assistive device: Rolling walker (2 wheels) Gait Pattern/deviations: Decreased step length - left, Step-to pattern, Decreased step length - right, Decreased stride length, Leaning posteriorly Gait velocity: reduced Gait velocity interpretation: <1.31 ft/sec, indicative of household ambulator Pre-gait activities: Marched in place in stedy 15x each with CGA-minA, cuing pt to lean anteriorly General Gait Details: Pt was only able to take a couple small steps, primarily in place, when standing with the RW. ModA needed for balance and to prevent pt from falling posteriorly. Verbal and tactile cues provided for pt to lean anteriorly to push RW down in ground and go up on his toes rather than lean back on his heels, min success noted.   Stairs             Wheelchair Mobility     Tilt Bed    Modified Rankin (Stroke Patients Only)       Balance Overall balance assessment: Needs assistance Sitting-balance support: Bilateral upper extremity supported, Feet supported Sitting balance-Leahy Scale: Poor   Postural control: Posterior lean Standing balance support: Bilateral upper extremity supported, Reliant on assistive device  for balance Standing balance-Leahy Scale: Poor Standing balance comment: Posterior lean noted, modA to stand with RW but minA-CGA to stand with stedy. Pt needs frequent cuing to bring hips towards stedy bar to reduce posterior lean                            Cognition Arousal: Alert Behavior During Therapy: WFL for  tasks assessed/performed Overall Cognitive Status: No family/caregiver present to determine baseline cognitive functioning                                 General Comments: Slowed responses. Poor problem-solving to correct his posterior lean. Upon arrival, pt was trying to play with his phone but had been locked out for > 2 hours per phone screen. He was aware that the battery was low though        Exercises Other Exercises Other Exercises: x15 reps sit <> stand from stedy flaps Other Exercises: Marched in place 15x standing in stedy    General Comments        Pertinent Vitals/Pain Pain Assessment Pain Assessment: Faces Faces Pain Scale: Hurts a little bit Pain Location: back with mobility Pain Descriptors / Indicators: Discomfort, Grimacing Pain Intervention(s): Monitored during session, Limited activity within patient's tolerance, Repositioned    Home Living                          Prior Function            PT Goals (current goals can now be found in the care plan section) Acute Rehab PT Goals Patient Stated Goal: go to rehab PT Goal Formulation: With patient Time For Goal Achievement: 04/08/23 Potential to Achieve Goals: Fair Progress towards PT goals: Progressing toward goals    Frequency    Min 1X/week      PT Plan      Co-evaluation              AM-PAC PT "6 Clicks" Mobility   Outcome Measure  Help needed turning from your back to your side while in a flat bed without using bedrails?: A Lot Help needed moving from lying on your back to sitting on the side of a flat bed without using bedrails?: Total Help needed moving to and from a bed to a chair (including a wheelchair)?: Total Help needed standing up from a chair using your arms (e.g., wheelchair or bedside chair)?: A Lot Help needed to walk in hospital room?: Total Help needed climbing 3-5 steps with a railing? : Total 6 Click Score: 8    End of Session Equipment  Utilized During Treatment: Gait belt Activity Tolerance: Patient tolerated treatment well Patient left: with call bell/phone within reach;in chair;with chair alarm set Nurse Communication: Need for lift equipment;Mobility status (NT) PT Visit Diagnosis: Other abnormalities of gait and mobility (R26.89);Unsteadiness on feet (R26.81);History of falling (Z91.81);Muscle weakness (generalized) (M62.81);Pain;Difficulty in walking, not elsewhere classified (R26.2) Pain - part of body:  (back)     Time: 8119-1478 PT Time Calculation (min) (ACUTE ONLY): 20 min  Charges:    $Therapeutic Activity: 8-22 mins PT General Charges $$ ACUTE PT VISIT: 1 Visit                     Virgil Benedict, PT, DPT Acute Rehabilitation Services  Office: [phone]    Darnelle Maffucci  Cloyde Reams 03/27/2023, 3:08 PM

## 2023-03-27 NOTE — Progress Notes (Addendum)
Patient becomes more confused as the night progresses. He is consistently oriented to self  but inconsistently oriented to place and situation. As the night progressed, he pulled off his purewick, kept thinking that he was at [facility] [place], repeated over and over "I have 4 trips" while trying to get up and unpurposefully fidgets with the covers and wires. Patient was placed in mittens prior to my shift start to prevent him from pulling out his IV and safety. Upon assessing patient, noticed that he had some blood at the base of mittens. Removed mittens and found a new skin tear to R-wrist. Patient has very fragile skin and had a previous skin tear to the R-upper arm that he was admitted to unit with. I placed dressing on wrist and padded area. He refused lactulose. Denied additional needs.

## 2023-03-27 NOTE — Plan of Care (Signed)
  Problem: Health Behavior/Discharge Planning: Goal: Ability to manage health-related needs will improve Outcome: Progressing   Problem: Clinical Measurements: Goal: Ability to maintain clinical measurements within normal limits will improve Outcome: Progressing Goal: Will remain free from infection Outcome: Progressing Goal: Diagnostic test results will improve Outcome: Progressing Goal: Respiratory complications will improve Outcome: Progressing Goal: Cardiovascular complication will be avoided Outcome: Progressing   Problem: Activity: Goal: Risk for activity intolerance will decrease Outcome: Progressing   Problem: Nutrition: Goal: Adequate nutrition will be maintained Outcome: Progressing   Problem: Coping: Goal: Level of anxiety will decrease Outcome: Progressing   Problem: Elimination: Goal: Will not experience complications related to bowel motility Outcome: Progressing Goal: Will not experience complications related to urinary retention Outcome: Progressing   Problem: Pain Management: Goal: General experience of comfort will improve Outcome: Progressing   Problem: Education: Goal: Knowledge of General Education information will improve Description: Including pain rating scale, medication(s)/side effects and non-pharmacologic comfort measures Outcome: Progressing   Problem: Health Behavior/Discharge Planning: Goal: Ability to manage health-related needs will improve Outcome: Progressing   Problem: Clinical Measurements: Goal: Ability to maintain clinical measurements within normal limits will improve Outcome: Progressing Goal: Will remain free from infection Outcome: Progressing Goal: Diagnostic test results will improve Outcome: Progressing Goal: Respiratory complications will improve Outcome: Progressing Goal: Cardiovascular complication will be avoided Outcome: Progressing   Problem: Activity: Goal: Risk for activity intolerance will  decrease Outcome: Progressing   Problem: Nutrition: Goal: Adequate nutrition will be maintained Outcome: Progressing   Problem: Coping: Goal: Level of anxiety will decrease Outcome: Progressing   Problem: Elimination: Goal: Will not experience complications related to bowel motility Outcome: Progressing Goal: Will not experience complications related to urinary retention Outcome: Progressing   Problem: Pain Management: Goal: General experience of comfort will improve Outcome: Progressing   Problem: Safety: Goal: Ability to remain free from injury will improve Outcome: Progressing

## 2023-03-27 NOTE — TOC Progression Note (Signed)
Transition of Care [facility]) - Progression Note    Patient Details  Name: Kenneth Bowen MRN: [id number] Date of Birth: Jul 26, 1930  [facility]) CM/SW Contact  Inis Sizer, LCSW Phone Number: 03/27/2023, 12:05 PM  Clinical Narrative:    CSW spoke with patient's son [name] to present bed offers. [name] selected [facility] for [employer]  CSW spoke with Lowella Bandy at [facility] to inform her of information.   Expected Discharge Plan: Skilled Nursing Facility Barriers to Discharge: SNF Pending bed offer  Expected Discharge Plan and Services In-house Referral: Clinical Social Work   Post Acute Care Choice:  (TBD) Living arrangements for the past 2 months: Independent Living Facility ([facility])                                       Social Determinants of Health (SDOH) Interventions SDOH Screenings   Food Insecurity: No Food Insecurity (03/25/2023)  Housing: Low Risk  (03/25/2023)  Transportation Needs: No Transportation Needs (03/25/2023)  Utilities: Not At Risk (03/25/2023)  Tobacco Use: Low Risk  (03/24/2023)    Readmission Risk Interventions     No data to display

## 2023-03-27 NOTE — Care Management Important Message (Signed)
Important Message  Patient Details  Name: Kenneth Bowen MRN: [id number] Date of Birth: [birth year]   Important Message Given:  Yes - Medicare IM     Dorena Bodo 03/27/2023, 3:09 PM

## 2023-03-27 NOTE — Progress Notes (Signed)
PROGRESS NOTE    [name]  QQV:[id number] DOB: [birth year] DOA: 03/24/2023 PCP: Charlane Ferretti, DO  Chief Complaint  Patient presents with   Fall    Brief Narrative:   Kenneth Bowen is Kenneth Bowen 89 y.o. male with medical history significant of of chronic heart block status post pacemaker, Parkison like syndrome, prostate cancer, seizure disorder, right lower extremity DVT on Eliquis, chronic constipation, BPH, chronic lower back pain and iron deficiency anemia presented to emergency department for evaluation for fall.   Per discussion with patient/wife and review of H&P appears he's been using wheelchair more often and has been progressively weaker over past 3 months.  He fell while transferring to commode from his wheelchair.  He presented to the hospital with worsening back pain.   Assessment & Plan:   Principal Problem:   Fall at home, initial encounter Active Problems:   History of T12 compression fracture (HCC)   Chronic lower back pain   Unstable gait   Hyponatremia   History of DVT (deep vein thrombosis)   Parkinsonian features   S/P placement of cardiac pacemaker   History of heart block   BPH (benign prostatic hyperplasia)   Complex partial seizure (HCC)   Normocytic anemia   Insomnia   GAD (generalized anxiety disorder)  Fall at home, initial encounter Chronic back pain/history of T12 compression fracture History of unstable gait. Parkinsonism. Sounds like things have been declining gradually for [name] while, maybe more rapidly over past few weeks. His wife implies that he may have been spending more time in the bed due to Kenneth Bowen fear of falling which may have accelerated things.  Based on his history, suspect parkinsonism maybe one of the major risk factors for his falls.    CT head without acute intracranial process CT c spine without acute fracture or traumatic listhesis in the cervical spine MRI T/L spine without acute fracture - T12 compression fracture with  progressive, severe height loss since 08/02/2022 - chronic L1-L5 compression fractures R femur plain films without fx or dislocation Will need continued neurology follow up outpatient  Home tramadol continued    Delirium Noted, suspect due to acute hospitalization Delirium precautions Additional workup as needed Resume home trazodone  Hyponatremia Mild, noted, will monitor    Parkinsonian features History of parkinsonian feature and unstable gait. -Continue with outpatient follow-up with neurology (last saw [name] on 12/27/2022)   History of DVT (deep vein thrombosis) -Continue with Eliquis   History of heart block S/p pacemaker in place since August 2024. -No acute concern -Continue to monitor   BPH (benign prostatic hyperplasia) -Continue Flomax   Complex partial seizure (HCC) -Continue home Depakote -Continue Topamax   Normocytic anemia -Continue home iron and folic acid supplement   Insomnia -Home trazodone    GAD (generalized anxiety disorder) -xanax prn   BPH Flomax       DVT prophylaxis: eliquis Code Status: DNR Family Communication: wife Disposition:   Status is: Inpatient Remains inpatient appropriate because: need for safe disposition   Consultants:  none  Procedures:  none  Antimicrobials:  Anti-infectives (From admission, onward)    None       Subjective: Wife: worked till he was 30, has gone downhill since then - depressed - falling x2 years (2-3 times in last 2 years) - couldn't get out of bed in last 2 weeks Patient notes he didn't do what he was supposed to - he was trying to turn and didn't take small  steps like he should've   Objective: Vitals:   03/27/23 0356 03/27/23 0357 03/27/23 0358 03/27/23 0738  BP:    (!) 158/137  Pulse: 66 61 (!) 114 73  Resp: (!) 21 17 18 18   Temp:    97.9 F (36.6 C)  TempSrc:    Oral  SpO2: 93% 94% 94% 97%  Weight:      Height:        Intake/Output Summary (Last 24 hours) at  03/27/2023 0102 Last data filed at 03/27/2023 0600 Gross per 24 hour  Intake --  Output 1840 ml  Net -1840 ml   Filed Weights   03/25/23 2005 03/26/23 0149 03/27/23 0353  Weight: 79.9 kg 79.9 kg 77.9 kg    Examination:  General exam: Appears calm and comfortable  Respiratory system: unlabored Cardiovascular system: RRR Central nervous system: Alert and oriented. Resting tremor.  Moving all extremities with equal strength.  CN 2-12 intact. Extremities: no lee    Data Reviewed: I have personally reviewed following labs and imaging studies  CBC: Recent Labs  Lab 03/24/23 2103 03/25/23 0605  WBC 5.8 5.1  HGB 11.0* 11.0*  HCT 33.1* 33.7*  MCV 97.6 97.1  PLT 155 177    Basic Metabolic Panel: Recent Labs  Lab 03/24/23 2103 03/25/23 2323 03/26/23 1232  NA 132* 132* 132*  K 5.1 4.9 4.7  CL 99 100 99  CO2 22 24 23   GLUCOSE 139* 88 90  BUN 19 16 16   CREATININE 0.76 0.82 0.85  CALCIUM 9.2 9.3 9.2    GFR: Estimated Creatinine Clearance: 56.7 mL/min (by C-G formula based on SCr of 0.85 mg/dL).  Liver Function Tests: Recent Labs  Lab 03/25/23 2323  AST 38  ALT 25  ALKPHOS 77  BILITOT 0.7  PROT 6.1*  ALBUMIN 3.0*    CBG: No results for input(s): "GLUCAP" in the last 168 hours.   No results found for this or any previous visit (from the past 240 hour(s)).       Radiology Studies: MR LUMBAR SPINE WO CONTRAST  Result Date: 03/25/2023 CLINICAL DATA:  Back trauma, abnormal neuro exam, CT or xray positive (Age >= 16y). EXAM: MRI LUMBAR SPINE WITHOUT CONTRAST TECHNIQUE: Multiplanar, multisequence MR imaging of the lumbar spine was performed. No intravenous contrast was administered. COMPARISON:  Lumbar spine radiographs 03/24/2023 and MRI 08/02/2022 FINDINGS: Segmentation:  Standard. Alignment: Mild thoracolumbar dextroscoliosis. Exaggerated lumbar lordosis. Vertebrae: Chronic L1 compression fracture with moderate vertebral body height loss, mildly progressed  from the prior MRI without residual marrow edema. Chronic L2-L5 compression fractures, unchanged from the prior MRI and with mild-to-moderate height loss. No acute fracture or suspicious marrow lesion. Conus medullaris and cauda equina: Conus extends to the L1 level. Conus and cauda equina appear normal. Paraspinal and other soft tissues: No acute finding. Disc levels: Disc desiccation throughout the lumbar spine. Asymmetrically advanced disc space narrowing on the left at L2-3 and on the right at L4-5. T12-L1: Minimal T12 retropulsion without stenosis. L1-2: Minimal disc bulging and mild facet hypertrophy without stenosis, unchanged. L2-3: Disc bulging and moderate facet and ligamentum flavum hypertrophy result in mild right and moderate left lateral recess stenosis and mild right and mild-to-moderate left neural foraminal stenosis without significant spinal stenosis, unchanged. L3-4: Disc bulging and severe right and moderate left facet and ligamentum flavum hypertrophy result in moderate spinal stenosis, moderate right greater than left lateral recess stenosis, and mild-to-moderate bilateral neural foraminal stenosis, unchanged. L4-5: Right eccentric disc bulging and severe facet  hypertrophy result in mild-to-moderate right neural foraminal stenosis without significant spinal stenosis, unchanged. L5-S1: Minimal disc bulging and severe facet hypertrophy without stenosis, unchanged. IMPRESSION: 1. Chronic L1-L5 compression fractures.  No acute fracture. 2. Unchanged lumbar disc and facet degeneration with moderate spinal stenosis at L3-4. Electronically Signed   By: Sebastian Ache M.D.   On: 03/25/2023 18:06   MR THORACIC SPINE WO CONTRAST  Result Date: 03/25/2023 CLINICAL DATA:  Back trauma, abnormal neuro exam, CT or xray positive (Age >= 16y). EXAM: MRI THORACIC SPINE WITHOUT CONTRAST TECHNIQUE: Multiplanar, multisequence MR imaging of the thoracic spine was performed. No intravenous contrast was  administered. COMPARISON:  Thoracic spine radiographs 03/24/2023. CT chest, abdomen, and pelvis 08/02/2022. MRI lumbar spine 08/02/2022. FINDINGS: The study is intermittently mildly to moderately motion degraded. Alignment: Mild scoliosis. Vertebrae: T12 vertebral body fracture with severe height loss anteriorly which has progressed from 08/02/2022. Mild residual edema in the T12 vertebral body, decreased from the prior MRI. Mild chronic T10 and T11 superior endplate compression fractures. No acute fracture. Cord:  Normal signal and morphology. Paraspinal and other soft tissues: Unremarkable. Disc levels: Mild thoracic spondylosis. No significant disc herniation or stenosis. IMPRESSION: 1. No acute thoracic spine fracture. 2. T12 compression fracture with progressive, severe height loss since 08/02/2022. Mild residual marrow edema. 3. Mild chronic T10 and T11 compression fractures. Electronically Signed   By: Sebastian Ache M.D.   On: 03/25/2023 17:58        Scheduled Meds:  apixaban  2.5 mg Oral BID   divalproex  750 mg Oral QHS   ferrous sulfate  325 mg Oral Daily   folic acid  1 mg Oral Daily   lactulose  10 g Oral BID   pantoprazole  40 mg Oral Daily   sodium chloride flush  10 mL Intravenous Q12H   tamsulosin  0.4 mg Oral QPC supper   topiramate  50 mg Oral QHS   Continuous Infusions:   LOS: 3 days    Time spent: over 30 min    Lacretia Nicks, MD [facility]   To contact the attending provider between 7A-7P or the covering provider during after hours 7P-7A, please log into the web site [email or web address] and access using universal [place] password for that web site. If you do not have the password, please call the hospital operator.  03/27/2023, 9:18 AM

## 2023-03-28 ENCOUNTER — Inpatient Hospital Stay (HOSPITAL_COMMUNITY): Payer: Medicare Other

## 2023-03-28 DIAGNOSIS — Y92009 Unspecified place in unspecified non-institutional (private) residence as the place of occurrence of the external cause: Secondary | ICD-10-CM | POA: Diagnosis not present

## 2023-03-28 DIAGNOSIS — W19XXXA Unspecified fall, initial encounter: Secondary | ICD-10-CM | POA: Diagnosis not present

## 2023-03-28 DIAGNOSIS — R2681 Unsteadiness on feet: Secondary | ICD-10-CM | POA: Diagnosis not present

## 2023-03-28 LAB — COMPREHENSIVE METABOLIC PANEL
ALT: 24 U/L (ref 0–44)
AST: 43 U/L — ABNORMAL HIGH (ref 15–41)
Albumin: 3.1 g/dL — ABNORMAL LOW (ref 3.5–5.0)
Alkaline Phosphatase: 78 U/L (ref 38–126)
Anion gap: 9 (ref 5–15)
BUN: 27 mg/dL — ABNORMAL HIGH (ref 8–23)
CO2: 21 mmol/L — ABNORMAL LOW (ref 22–32)
Calcium: 9.1 mg/dL (ref 8.9–10.3)
Chloride: 96 mmol/L — ABNORMAL LOW (ref 98–111)
Creatinine, Ser: 1.27 mg/dL — ABNORMAL HIGH (ref 0.61–1.24)
GFR, Estimated: 54 mL/min — ABNORMAL LOW (ref >60)
Glucose, Bld: 98 mg/dL (ref 70–99)
Potassium: 3.9 mmol/L (ref 3.5–5.1)
Sodium: 126 mmol/L — ABNORMAL LOW (ref 135–145)
Total Bilirubin: 1.1 mg/dL (ref <1.2)
Total Protein: 6.6 g/dL (ref 6.5–8.1)

## 2023-03-28 LAB — CBC WITH DIFFERENTIAL/PLATELET
Abs Immature Granulocytes: 0.08 10*3/uL — ABNORMAL HIGH (ref 0.00–0.07)
Basophils Absolute: 0 10*3/uL (ref 0.0–0.1)
Basophils Relative: 1 %
Eosinophils Absolute: 0.1 10*3/uL (ref 0.0–0.5)
Eosinophils Relative: 1 %
HCT: 34.5 % — ABNORMAL LOW (ref 39.0–52.0)
Hemoglobin: 11.9 g/dL — ABNORMAL LOW (ref 13.0–17.0)
Immature Granulocytes: 1 %
Lymphocytes Relative: 17 %
Lymphs Abs: 1 10*3/uL (ref 0.7–4.0)
MCH: 32.2 pg (ref 26.0–34.0)
MCHC: 34.5 g/dL (ref 30.0–36.0)
MCV: 93.2 fL (ref 80.0–100.0)
Monocytes Absolute: 0.9 10*3/uL (ref 0.1–1.0)
Monocytes Relative: 14 %
Neutro Abs: 4.1 10*3/uL (ref 1.7–7.7)
Neutrophils Relative %: 66 %
Platelets: 188 10*3/uL (ref 150–400)
RBC: 3.7 MIL/uL — ABNORMAL LOW (ref 4.22–5.81)
RDW: 13.2 % (ref 11.5–15.5)
WBC: 6.1 10*3/uL (ref 4.0–10.5)
nRBC: 0 % (ref 0.0–0.2)

## 2023-03-28 LAB — BASIC METABOLIC PANEL
Anion gap: 9 (ref 5–15)
BUN: 26 mg/dL — ABNORMAL HIGH (ref 8–23)
CO2: 23 mmol/L (ref 22–32)
Calcium: 9.3 mg/dL (ref 8.9–10.3)
Chloride: 96 mmol/L — ABNORMAL LOW (ref 98–111)
Creatinine, Ser: 0.85 mg/dL (ref 0.61–1.24)
GFR, Estimated: >60 mL/min (ref >60)
Glucose, Bld: 118 mg/dL — ABNORMAL HIGH (ref 70–99)
Potassium: 4.1 mmol/L (ref 3.5–5.1)
Sodium: 128 mmol/L — ABNORMAL LOW (ref 135–145)

## 2023-03-28 LAB — MAGNESIUM: Magnesium: 1.8 mg/dL (ref 1.7–2.4)

## 2023-03-28 LAB — PHOSPHORUS: Phosphorus: 3.8 mg/dL (ref 2.5–4.6)

## 2023-03-28 LAB — BRAIN NATRIURETIC PEPTIDE: B Natriuretic Peptide: 67.8 pg/mL (ref 0.0–100.0)

## 2023-03-28 NOTE — Plan of Care (Signed)

## 2023-03-28 NOTE — Progress Notes (Signed)
Occupational Therapy Treatment Patient Details Name: Kenneth Bowen MRN: [id number] DOB: [birth year] Today's Date: 03/28/2023   History of present illness Pt is 89 year old presented [facility]  [facility] for fall and back pain. CT spine negative. MRI showed chronic T10-L5 compression fractures, but no acute fractures. PMH - chronic heart block status post pacemaker, Parkison like syndrome, prostate cancer, seizure disorder, right lower extremity DVT on Eliquis, chronic constipation, BPH, chronic lower back pain   OT comments  Patient with fair progress toward patient focused goals.  No significant improvements to mobility, but increased independence with ADL once in supported sitting.  Setup for eating and grooming, Max A for lower body ADL seated, and supervision the increased time for upper body ADL.  OT will continue efforts in the acute setting to address deficits, and Patient will benefit from continued inpatient follow up therapy, <3 hours/day       If plan is discharge home, recommend the following:  A lot of help with bathing/dressing/bathroom;Two people to help with walking and/or transfers;Assist for transportation   Equipment Recommendations  None recommended by OT    Recommendations for Other Services      Precautions / Restrictions Precautions Precautions: Fall Restrictions Weight Bearing Restrictions: No       Mobility Bed Mobility Overal bed mobility: Needs Assistance Bed Mobility: Supine to Sit     Supine to sit: Mod assist          Transfers Overall transfer level: Needs assistance Equipment used: Rolling walker (2 wheels), Ambulation equipment used Transfers: Sit to/from Stand, Bed to chair/wheelchair/BSC Sit to Stand: Mod assist Stand pivot transfers: Max assist               Balance Overall balance assessment: Needs assistance Sitting-balance support: Bilateral upper extremity supported, Feet supported Sitting balance-Leahy Scale: Fair      Standing balance support: Reliant on assistive device for balance Standing balance-Leahy Scale: Zero                             ADL either performed or assessed with clinical judgement   ADL       Grooming: Wash/dry hands;Wash/dry face;Set up;Sitting                   Toilet Transfer: Maximal assistance;Rolling walker (2 wheels);BSC/3in1;Stand-pivot   Toileting- Clothing Manipulation and Hygiene: Total assistance;Sit to/from stand              Extremity/Trunk Assessment Upper Extremity Assessment Upper Extremity Assessment: Generalized weakness;RUE deficits/detail;LUE deficits/detail RUE Deficits / Details: tremors RUE Coordination: decreased fine motor LUE Deficits / Details: tremors LUE Coordination: decreased fine motor   Lower Extremity Assessment Lower Extremity Assessment: Defer to PT evaluation   Cervical / Trunk Assessment Cervical / Trunk Assessment: Kyphotic    Vision Patient Visual Report: No change from baseline     Perception Perception Perception: Not tested   Praxis Praxis Praxis: Not tested    Cognition Arousal: Alert Behavior During Therapy: WFL for tasks assessed/performed Overall Cognitive Status: Impaired/Different from baseline Area of Impairment: Orientation, Following commands, Awareness, Problem solving                 Orientation Level: Time     Following Commands: Follows one step commands with increased time   Awareness: Emergent Problem Solving: Slow processing, Requires verbal cues          Exercises  Shoulder Instructions       General Comments  VSS on RA    Pertinent Vitals/ Pain       Pain Assessment Pain Assessment: No/denies pain Pain Intervention(s): Monitored during session                                                          Frequency  Min 1X/week        Progress Toward Goals  OT Goals(current goals can now be found in the care plan  section)  Progress towards OT goals: Progressing toward goals  Acute Rehab OT Goals OT Goal Formulation: With patient Time For Goal Achievement: 04/08/23 Potential to Achieve Goals: Good  Plan      Co-evaluation                 AM-PAC OT "6 Clicks" Daily Activity     Outcome Measure   Help from another person eating meals?: A Little Help from another person taking care of personal grooming?: A Little Help from another person toileting, which includes using toliet, bedpan, or urinal?: A Lot Help from another person bathing (including washing, rinsing, drying)?: A Lot Help from another person to put on and taking off regular upper body clothing?: A Lot Help from another person to put on and taking off regular lower body clothing?: A Lot 6 Click Score: 14    End of Session Equipment Utilized During Treatment: Gait belt;Rolling walker (2 wheels)  OT Visit Diagnosis: Unsteadiness on feet (R26.81);Muscle weakness (generalized) (M62.81);History of falling (Z91.81)   Activity Tolerance Patient tolerated treatment well   Patient Left in chair;with call bell/phone within reach;with chair alarm set   Nurse Communication Mobility status        Time: 1005-1026 OT Time Calculation (min): 21 min  Charges: OT General Charges $OT Visit: 1 Visit OT Treatments $Self Care/Home Management : 8-22 mins  03/28/2023  RP, OTR/L  Acute Rehabilitation Services  Office:  [phone]   Kenneth Bowen 03/28/2023, 10:32 AM

## 2023-03-28 NOTE — TOC Progression Note (Signed)
Transition of Care [facility]) - Progression Note    Patient Details  Name: Kenneth Bowen MRN: [id number] Date of Birth: [birth year]  Transition of Care [facility]) CM/SW Contact  Erin Sons, [place] Phone Number: 03/28/2023, 11:41 AM  Clinical Narrative:     Per attending, pt not medically ready for DC today. TOC will continue to follow. CSW updated [facility]   Expected Discharge Plan: Skilled Nursing Facility Barriers to Discharge: SNF Pending bed offer  Expected Discharge Plan and Services In-house Referral: Clinical Social Work   Post Acute Care Choice:  (TBD) Living arrangements for the past 2 months: Independent Living Facility ([facility])                                       Social Determinants of Health (SDOH) Interventions SDOH Screenings   Food Insecurity: No Food Insecurity (03/25/2023)  Housing: Low Risk  (03/25/2023)  Transportation Needs: No Transportation Needs (03/25/2023)  Utilities: Not At Risk (03/25/2023)  Tobacco Use: Low Risk  (03/24/2023)    Readmission Risk Interventions     No data to display

## 2023-03-28 NOTE — Plan of Care (Signed)
  Problem: Health Behavior/Discharge Planning: Goal: Ability to manage health-related needs will improve Outcome: Progressing   Problem: Clinical Measurements: Goal: Diagnostic test results will improve Outcome: Progressing   Problem: Nutrition: Goal: Adequate nutrition will be maintained Outcome: Progressing   Problem: Activity: Goal: Risk for activity intolerance will decrease Outcome: Progressing

## 2023-03-28 NOTE — Progress Notes (Signed)
PROGRESS NOTE    Kenneth Bowen  [id number]:[id number] DOB: 06/24/1930 DOA: 03/24/2023 PCP: Charlane Ferretti, DO  Chief Complaint  Patient presents with   Fall    Brief Narrative:   Kenneth Bowen is Kenneth Bowen 89 y.o. male with medical history significant of of chronic heart block status post pacemaker, Parkison like syndrome, prostate cancer, seizure disorder, right lower extremity DVT on Eliquis, chronic constipation, BPH, chronic lower back pain and iron deficiency anemia presented to emergency department for evaluation for fall.   Per discussion with patient/wife and review of H&P appears he's been using wheelchair more often and has been progressively weaker over past 3 months.  He fell while transferring to commode from his wheelchair.  He presented to the hospital with worsening back pain.   Assessment & Plan:   Principal Problem:   Fall at home, initial encounter Active Problems:   History of T12 compression fracture (HCC)   Chronic lower back pain   Unstable gait   Hyponatremia   History of DVT (deep vein thrombosis)   Parkinsonian features   S/P placement of cardiac pacemaker   History of heart block   BPH (benign prostatic hyperplasia)   Complex partial seizure (HCC)   Normocytic anemia   Insomnia   GAD (generalized anxiety disorder)  Fall at home, initial encounter Chronic back pain/history of T12 compression fracture History of unstable gait. Parkinsonism. Sounds like things have been declining gradually for Kenneth Bowen while, maybe more rapidly over past few weeks. His wife implies that he may have been spending more time in the bed due to Kenneth Bowen fear of falling which may have accelerated things.  Based on his history, suspect parkinsonism maybe one of the major risk factors for his falls.    CT head without acute intracranial process CT c spine without acute fracture or traumatic listhesis in the cervical spine MRI T/L spine without acute fracture - T12 compression fracture with  progressive, severe height loss since 08/02/2022 - chronic L1-L5 compression fractures R femur plain films without fx or dislocation Will need continued neurology follow up outpatient  Home tramadol continued    Hyponatremia Acute Kidney Injury  Worsened today, will follow closely  Encourage PO (noted IVF shortage) - with AKI/hyponatremia, suspected due to hypovolemia, but will follow closely (continue to hold home lasix)  Delirium Noted, suspect due to acute hospitalization -> improved today Delirium precautions Resume home trazodone   Parkinsonian features History of parkinsonian feature and unstable gait. -Continue with outpatient follow-up with neurology (last saw Kenneth Austin NP on 12/27/2022)   History of DVT (deep vein thrombosis) -Continue with Eliquis   History of heart block S/p pacemaker in place since August 2024. -No acute concern -Continue to monitor   BPH (benign prostatic hyperplasia) -Continue Flomax   Complex partial seizure (HCC) -Continue home Depakote -Continue Topamax   Normocytic anemia -Continue home iron and folic acid supplement   Insomnia -Home trazodone    GAD (generalized anxiety disorder) -xanax prn   BPH Flomax       DVT prophylaxis: eliquis Code Status: DNR Family Communication: wife Disposition:   Status is: Inpatient Remains inpatient appropriate because: need for safe disposition   Consultants:  none  Procedures:  none  Antimicrobials:  Anti-infectives (From admission, onward)    None       Subjective: No new complaints  Objective: Vitals:   03/27/23 2006 03/28/23 0019 03/28/23 0336 03/28/23 0751  BP: 96/74 95/71 110/84 136/76  Pulse: 88 76 81 78  Resp: 14 17 15 16   Temp: (!) 97 F (36.1 C) (!) 97 F (36.1 C) (!) 97.1 F (36.2 C)   TempSrc: Oral Oral Axillary   SpO2: 93% 97% 99% 96%  Weight:   81.9 kg   Height:        Intake/Output Summary (Last 24 hours) at 03/28/2023 0918 Last data filed at  03/28/2023 0023 Gross per 24 hour  Intake 430 ml  Output 500 ml  Net -70 ml   Filed Weights   03/26/23 0149 03/27/23 0353 03/28/23 0336  Weight: 79.9 kg 77.9 kg 81.9 kg    Examination:  General: No acute distress. Cardiovascular: RRR Lungs: unlabored Neurological: Alert. Moves all extremities 4 with equal strength. Cranial nerves II through XII grossly intact. Extremities: No clubbing or cyanosis. No edema.   Data Reviewed: I have personally reviewed following labs and imaging studies  CBC: Recent Labs  Lab 03/24/23 2103 03/25/23 0605 03/28/23 0410  WBC 5.8 5.1 6.1  NEUTROABS  --   --  4.1  HGB 11.0* 11.0* 11.9*  HCT 33.1* 33.7* 34.5*  MCV 97.6 97.1 93.2  PLT 155 177 188    Basic Metabolic Panel: Recent Labs  Lab 03/24/23 2103 03/25/23 2323 03/26/23 1232 03/28/23 0410  NA 132* 132* 132* 126*  K 5.1 4.9 4.7 3.9  CL 99 100 99 96*  CO2 22 24 23  21*  GLUCOSE 139* 88 90 98  BUN 19 16 16  27*  CREATININE 0.76 0.82 0.85 1.27*  CALCIUM 9.2 9.3 9.2 9.1  MG  --   --   --  1.8  PHOS  --   --   --  3.8    GFR: Estimated Creatinine Clearance: 38.8 mL/min (K[place] Messam[place]) (by C-G formula based on SCr of 1.27 mg/dL (H)).  Liver Function Tests: Recent Labs  Lab 03/25/23 2323 03/28/23 0410  AST 38 43*  ALT 25 24  ALKPHOS 77 78  BILITOT 0.7 1.1  PROT 6.1* 6.6  ALBUMIN 3.0* 3.1*    CBG: No results for input(s): "GLUCAP" in the last 168 hours.   No results found for this or any previous visit (from the past 240 hour(s)).       Radiology Studies: No results found.      Scheduled Meds:  apixaban  2.5 mg Oral BID   divalproex  750 mg Oral QHS   ferrous sulfate  325 mg Oral Daily   folic acid  1 mg Oral Daily   lactulose  10 g Oral BID   pantoprazole  40 mg Oral Daily   sodium chloride flush  10 mL Intravenous Q12H   tamsulosin  0.4 mg Oral QPC supper   topiramate  50 mg Oral QHS   traZODone  50 mg Oral QHS   Continuous Infusions:   LOS: 4 days     Time spent: over 30 min    Lacretia Nicks, MD [facility]   To contact the attending provider between 7A-7P or the covering provider during after hours 7P-7A, please log into the web site [email or web address] and access using universal [place] password for that web site. If you do not have the password, please call the hospital operator.  03/28/2023, 9:18 AM

## 2023-03-29 DIAGNOSIS — R2681 Unsteadiness on feet: Secondary | ICD-10-CM | POA: Diagnosis not present

## 2023-03-29 DIAGNOSIS — W19XXXA Unspecified fall, initial encounter: Secondary | ICD-10-CM | POA: Diagnosis not present

## 2023-03-29 DIAGNOSIS — Y92009 Unspecified place in unspecified non-institutional (private) residence as the place of occurrence of the external cause: Secondary | ICD-10-CM | POA: Diagnosis not present

## 2023-03-29 LAB — CBC
HCT: 32.3 % — ABNORMAL LOW (ref 39.0–52.0)
Hemoglobin: 11.1 g/dL — ABNORMAL LOW (ref 13.0–17.0)
MCH: 32.2 pg (ref 26.0–34.0)
MCHC: 34.4 g/dL (ref 30.0–36.0)
MCV: 93.6 fL (ref 80.0–100.0)
Platelets: 154 10*3/uL (ref 150–400)
RBC: 3.45 MIL/uL — ABNORMAL LOW (ref 4.22–5.81)
RDW: 12.9 % (ref 11.5–15.5)
WBC: 5 10*3/uL (ref 4.0–10.5)
nRBC: 0 % (ref 0.0–0.2)

## 2023-03-29 LAB — BASIC METABOLIC PANEL
Anion gap: 10 (ref 5–15)
BUN: 21 mg/dL (ref 8–23)
CO2: 23 mmol/L (ref 22–32)
Calcium: 9.2 mg/dL (ref 8.9–10.3)
Chloride: 99 mmol/L (ref 98–111)
Creatinine, Ser: 0.8 mg/dL (ref 0.61–1.24)
GFR, Estimated: >60 mL/min (ref >60)
Glucose, Bld: 82 mg/dL (ref 70–99)
Potassium: 4.3 mmol/L (ref 3.5–5.1)
Sodium: 132 mmol/L — ABNORMAL LOW (ref 135–145)

## 2023-03-29 LAB — PHOSPHORUS: Phosphorus: 3.3 mg/dL (ref 2.5–4.6)

## 2023-03-29 LAB — MAGNESIUM: Magnesium: 2 mg/dL (ref 1.7–2.4)

## 2023-03-29 NOTE — TOC Transition Note (Signed)
Transition of Care [facility]) - CM/SW Discharge Note   Patient Details  Name: Kenneth Bowen MRN: [id number] Date of Birth: [birth year]  Transition of Care [facility]) CM/SW Contact:  Erin Sons, LCSW Phone Number: 03/29/2023, 11:05 AM   Clinical Narrative:     Spoke with pt this morning who states he would like [address] instead of [facility] [employer]. CSW confirmed choice with pt's son. CSW confirmed bed offer with [facility]. They are ready to admit pt today.   Per MD patient ready for DC to [facility] RN, patient, patient's family, and facility notified of DC. Discharge Summary and FL2 sent to facility. RN to call report prior to discharge [phone]. [id number]). DC packet on chart. Ambulance transport requested for patient.   CSW will sign off for now as social work intervention is no longer needed. Please consult [place] again if new needs arise.     Barriers to Discharge: No Barriers Identified   Patient Goals and CMS Choice   Choice offered to / list presented to : Adult Children (son Kenneth Hua)  Discharge Placement                Patient chooses bed at: [facility] Patient to be transferred to facility by: [facility] Name of family member notified: Son Kenneth Hua Patient and family notified of of transfer: 03/29/23  Discharge Plan and Services Additional resources added to the After Visit Summary for   In-house Referral: Clinical Social Work   Post Acute Care Choice:  (TBD)                               Social Determinants of Health (SDOH) Interventions SDOH Screenings   Food Insecurity: No Food Insecurity (03/25/2023)  Housing: Low Risk  (03/25/2023)  Transportation Needs: No Transportation Needs (03/25/2023)  Utilities: Not At Risk (03/25/2023)  Tobacco Use: Low Risk  (03/24/2023)     Readmission Risk Interventions     No data to display

## 2023-03-29 NOTE — Discharge Summary (Signed)
Physician Discharge Summary  Draco Donate Short LKG:[id number] DOB: Dec 16, 1930 DOA: 03/24/2023  PCP: Charlane Ferretti, DO  Admit date: 03/24/2023 Discharge date: 03/29/2023  Time spent: 40 minutes  Recommendations for Outpatient Follow-up:  Follow outpatient CBC/CMP  Follow with neurology outpatient for parkinsonism Follow chronic back pain outpatient Follow mood outpatient, wife suspects Shomari Scicchitano component of depressed mood Follow renal function/lytes with lasix -> had mild AKI/hyponatremia here, will need to be followed closely   Discharge Diagnoses:  Principal Problem:   Fall at home, initial encounter Active Problems:   History of T12 compression fracture (HCC)   Chronic lower back pain   Unstable gait   Hyponatremia   History of DVT (deep vein thrombosis)   Parkinsonian features   S/P placement of cardiac pacemaker   History of heart block   BPH (benign prostatic hyperplasia)   Complex partial seizure (HCC)   Normocytic anemia   Insomnia   GAD (generalized anxiety disorder)   Discharge Condition: stable  Diet recommendation: heart healthy  Filed Weights   03/27/23 0353 03/28/23 0336 03/29/23 0508  Weight: 77.9 kg 81.9 kg 82.5 kg    History of present illness:   CHURCHILL GERE is Rakesha Dalporto 89 y.o. male with medical history significant of of chronic heart block status post pacemaker, Parkison like syndrome, prostate cancer, seizure disorder, right lower extremity DVT on Eliquis, chronic constipation, BPH, chronic lower back pain and iron deficiency anemia presented to emergency department for evaluation for fall.    Per discussion with patient/wife and review of H&P appears he's been using wheelchair more often and has been progressively weaker over past 3 months.  He fell while transferring to commode from his wheelchair.  He presented to the hospital with worsening back pain.   S/p imaging which showed T12 compression fracture with progressive, severe height loss since 07/2022,  chronic L1-L5 compression fractures.  His hospitalization was complicated by delirium, which is now improved.    Currently stable for discharge.  Plan is for SNF for rehab.   Hospital Course:  Assessment and Plan:  Fall at home, initial encounter Chronic back pain  Compression Fractures History of unstable gait. Parkinsonism. Sounds like things have been declining gradually for Bobbijo Holst while, maybe more rapidly over past few weeks. His wife implies that he may have been spending more time in the bed due to Sky Primo fear of falling which may have accelerated things in Aryeh Butterfield sort of vicious cycle.  Based on his history, suspect parkinsonism maybe one of the major risk factors for his falls.    CT head without acute intracranial process CT c spine without acute fracture or traumatic listhesis in the cervical spine MRI T/L spine without acute fracture - T12 compression fracture with progressive, severe height loss since 08/02/2022 - chronic L1-L5 compression fractures R femur plain films without fx or dislocation Will need continued neurology follow up outpatient  Home tramadol continued    Hyponatremia Acute Kidney Injury  Improved on day of discharge Follow closely when resuming lasix after discharge - repeat labs within 2-3 days   Delirium Noted, suspect due to acute hospitalization -> resolved Delirium precautions Resume home trazodone   Parkinsonian features History of parkinsonian feature and unstable gait. -Continue with outpatient follow-up with neurology (last saw [name] on 12/27/2022)   History of DVT (deep vein thrombosis) -Continue with Eliquis   History of heart block S/p pacemaker in place since August 2024. -No acute concern -Continue to monitor   BPH (benign prostatic  hyperplasia) -Continue Flomax   Complex partial seizure (HCC) -Continue home Depakote -Continue Topamax   Normocytic anemia -Continue home iron and folic acid supplement   Insomnia -Home trazodone     GAD (generalized anxiety disorder) -xanax prn    BPH Flomax     Procedures: none   Consultations: none  Discharge Exam: Vitals:   03/29/23 0425 03/29/23 0839  BP: 121/62 (!) 107/57  Pulse: 60 (!) 59  Resp: 12 13  Temp: (!) 97.4 F (36.3 C) 97.8 F (36.6 C)  SpO2: 97% 100%   No complaints Discussed discharge plan with patient and his wife (over phone)  General: No acute distress. Cardiovascular: Heart sounds show Keli Buehner regular rate, and rhythm. Lungs: unlabored Neurological: Alert and oriented 3. Resting tremor.  Moves all extremities 4 Cranial nerves II through XII grossly intact. Extremities: No clubbing or cyanosis. No edema.   Discharge Instructions   Discharge Instructions     Call MD for:  difficulty breathing, headache or visual disturbances   Complete by: As directed    Call MD for:  extreme fatigue   Complete by: As directed    Call MD for:  hives   Complete by: As directed    Call MD for:  persistant dizziness or light-headedness   Complete by: As directed    Call MD for:  persistant nausea and vomiting   Complete by: As directed    Call MD for:  redness, tenderness, or signs of infection (pain, swelling, redness, odor or green/yellow discharge around incision site)   Complete by: As directed    Call MD for:  severe uncontrolled pain   Complete by: As directed    Call MD for:  temperature >100.4   Complete by: As directed    Diet - low sodium heart healthy   Complete by: As directed    Discharge instructions   Complete by: As directed    You were seen after Aleisa Howk fall.  You had imaging that shows compression fractures which is likely contributing to and causing your back pain.    You'll need to continue to participate with therapy in efforts to maintain your strength and lower your fall risk.    You should follow up with neurology for your parkinsonism.  Return for new, recurrent, or worsening symptoms.  Please ask your PCP to request  records from this hospitalization so they know what was done and what the next steps will be.   Increase activity slowly   Complete by: As directed    No wound care   Complete by: As directed       Allergies as of 03/29/2023       Reactions   Tape Other (See Comments)   Skin tearing         Medication List     TAKE these medications    ALPRAZolam 0.5 MG tablet Commonly known as: XANAX Take 0.5 mg by mouth at bedtime as needed for sleep.   ARTIFICIAL TEAR OP Place 1 drop into both eyes at bedtime as needed (dry eye).   cholecalciferol 1000 units tablet Commonly known as: VITAMIN D Take 2,000 Units by mouth daily.   desoximetasone 0.25 % cream Commonly known as: TOPICORT Apply 1 Application topically at bedtime.   divalproex 500 MG 24 hr tablet Commonly known as: DEPAKOTE ER TAKE 1 TABLET (500 MG TOTAL) BY MOUTH DAILY. What changed:  when to take this Another medication with the same name was removed. Continue taking this  medication, and follow the directions you see here.   Eliquis 2.5 MG Tabs tablet Generic drug: apixaban Take 1 tablet (2.5 mg total) by mouth 2 (two) times daily. Resume from 06/10/22   folic acid 1 MG tablet Commonly known as: FOLVITE Take 1 tablet (1 mg total) by mouth daily.   furosemide 40 MG tablet Commonly known as: LASIX Take 40 mg by mouth daily.   IRON PO Take 1 tablet by mouth daily.   lactulose 10 GM/15ML solution Commonly known as: CHRONULAC Take 15 mLs (10 g total) by mouth 2 (two) times daily. What changed:  when to take this reasons to take this   mometasone 0.1 % ointment Commonly known as: ELOCON Apply 1 application topically 2 (two) times daily as needed (rash).   multivitamin tablet Take 1 tablet by mouth daily.   mupirocin ointment 2 % Commonly known as: BACTROBAN Apply 1 Application topically 2 (two) times daily.   omeprazole 20 MG capsule Commonly known as: PRILOSEC Take 20 mg by mouth daily.    tamsulosin 0.4 MG Caps capsule Commonly known as: FLOMAX TAKE 1 CAPSULE (0.4 MG TOTAL) BY MOUTH DAILY AFTER SUPPER. What changed: when to take this   topiramate 50 MG tablet Commonly known as: TOPAMAX Take 1 tablet (50 mg total) by mouth at bedtime.   traMADol 50 MG tablet Commonly known as: ULTRAM Take 1 tablet (50 mg total) by mouth every 6 (six) hours as needed for moderate pain.   traZODone 50 MG tablet Commonly known as: DESYREL Take 50 mg by mouth at bedtime.   triamcinolone cream 0.1 % Commonly known as: KENALOG Apply 1 Application topically as directed.       Allergies  Allergen Reactions   Tape Other (See Comments)    Skin tearing     Contact information for after-discharge care     Destination     [facility] Preferred SNF .   Service: Skilled Nursing Contact information: [address] [place] [address] [phone]                      The results of significant diagnostics from this hospitalization (including imaging, microbiology, ancillary and laboratory) are listed below for reference.    Significant Diagnostic Studies: DG CHEST PORT 1 VIEW  Result Date: 03/28/2023 CLINICAL DATA:  Fall, hyponatremia. EXAM: PORTABLE CHEST 1 VIEW COMPARISON:  August 02, 2022. FINDINGS: The heart size and mediastinal contours are within normal limits. Left-sided pacemaker is unchanged. Minimal bibasilar subsegmental atelectasis or scarring is noted. The visualized skeletal structures are unremarkable. IMPRESSION: Minimal bibasilar subsegmental atelectasis or scarring. Electronically Signed   By: Lupita Raider M.D.   On: 03/28/2023 14:04   MR LUMBAR SPINE WO CONTRAST  Result Date: 03/25/2023 CLINICAL DATA:  Back trauma, abnormal neuro exam, CT or xray positive (Age >= 16y). EXAM: MRI LUMBAR SPINE WITHOUT CONTRAST TECHNIQUE: Multiplanar, multisequence MR imaging of the lumbar spine was performed. No intravenous contrast was  administered. COMPARISON:  Lumbar spine radiographs 03/24/2023 and MRI 08/02/2022 FINDINGS: Segmentation:  Standard. Alignment: Mild thoracolumbar dextroscoliosis. Exaggerated lumbar lordosis. Vertebrae: Chronic L1 compression fracture with moderate vertebral body height loss, mildly progressed from the prior MRI without residual marrow edema. Chronic L2-L5 compression fractures, unchanged from the prior MRI and with mild-to-moderate height loss. No acute fracture or suspicious marrow lesion. Conus medullaris and cauda equina: Conus extends to the L1 level. Conus and cauda equina appear normal. Paraspinal and other soft  tissues: No acute finding. Disc levels: Disc desiccation throughout the lumbar spine. Asymmetrically advanced disc space narrowing on the left at L2-3 and on the right at L4-5. T12-L1: Minimal T12 retropulsion without stenosis. L1-2: Minimal disc bulging and mild facet hypertrophy without stenosis, unchanged. L2-3: Disc bulging and moderate facet and ligamentum flavum hypertrophy result in mild right and moderate left lateral recess stenosis and mild right and mild-to-moderate left neural foraminal stenosis without significant spinal stenosis, unchanged. L3-4: Disc bulging and severe right and moderate left facet and ligamentum flavum hypertrophy result in moderate spinal stenosis, moderate right greater than left lateral recess stenosis, and mild-to-moderate bilateral neural foraminal stenosis, unchanged. L4-5: Right eccentric disc bulging and severe facet hypertrophy result in mild-to-moderate right neural foraminal stenosis without significant spinal stenosis, unchanged. L5-S1: Minimal disc bulging and severe facet hypertrophy without stenosis, unchanged. IMPRESSION: 1. Chronic L1-L5 compression fractures.  No acute fracture. 2. Unchanged lumbar disc and facet degeneration with moderate spinal stenosis at L3-4. Electronically Signed   By: Sebastian Ache M.D.   On: 03/25/2023 18:06   MR THORACIC  SPINE WO CONTRAST  Result Date: 03/25/2023 CLINICAL DATA:  Back trauma, abnormal neuro exam, CT or xray positive (Age >= 16y). EXAM: MRI THORACIC SPINE WITHOUT CONTRAST TECHNIQUE: Multiplanar, multisequence MR imaging of the thoracic spine was performed. No intravenous contrast was administered. COMPARISON:  Thoracic spine radiographs 03/24/2023. CT chest, abdomen, and pelvis 08/02/2022. MRI lumbar spine 08/02/2022. FINDINGS: The study is intermittently mildly to moderately motion degraded. Alignment: Mild scoliosis. Vertebrae: T12 vertebral body fracture with severe height loss anteriorly which has progressed from 08/02/2022. Mild residual edema in the T12 vertebral body, decreased from the prior MRI. Mild chronic T10 and T11 superior endplate compression fractures. No acute fracture. Cord:  Normal signal and morphology. Paraspinal and other soft tissues: Unremarkable. Disc levels: Mild thoracic spondylosis. No significant disc herniation or stenosis. IMPRESSION: 1. No acute thoracic spine fracture. 2. T12 compression fracture with progressive, severe height loss since 08/02/2022. Mild residual marrow edema. 3. Mild chronic T10 and T11 compression fractures. Electronically Signed   By: Sebastian Ache M.D.   On: 03/25/2023 17:58   DG Pelvis Portable  Result Date: 03/25/2023 CLINICAL DATA:  Status post fall at home, rule out fracture or dislocation. EXAM: PORTABLE PELVIS 1-2 VIEWS; RIGHT FEMUR 2 VIEWS COMPARISON:  08/02/2022. FINDINGS: Pelvis: No acute fracture or dislocation is seen. Mild degenerative changes are noted at the hips bilaterally. Degenerative changes are present in the lower lumbar spine. Surgical clips are present in the inguinal region on the right. Femur: No acute fracture or dislocation is seen. Degenerative changes are present at the hip and knee. Chondrocalcinosis is noted at the knee. Vascular calcifications are present in the soft tissues. IMPRESSION: No acute fracture or dislocation.  Electronically Signed   By: Thornell Sartorius M.D.   On: 03/25/2023 02:04   DG FEMUR, MIN 2 VIEWS RIGHT  Result Date: 03/25/2023 CLINICAL DATA:  Status post fall at home, rule out fracture or dislocation. EXAM: PORTABLE PELVIS 1-2 VIEWS; RIGHT FEMUR 2 VIEWS COMPARISON:  08/02/2022. FINDINGS: Pelvis: No acute fracture or dislocation is seen. Mild degenerative changes are noted at the hips bilaterally. Degenerative changes are present in the lower lumbar spine. Surgical clips are present in the inguinal region on the right. Femur: No acute fracture or dislocation is seen. Degenerative changes are present at the hip and knee. Chondrocalcinosis is noted at the knee. Vascular calcifications are present in the soft tissues. IMPRESSION: No acute  fracture or dislocation. Electronically Signed   By: Thornell Sartorius M.D.   On: 03/25/2023 02:04   DG Thoracic Spine 2 View  Result Date: 03/24/2023 CLINICAL DATA:  fall, EXAM: THORACIC SPINE 2 VIEWS; LUMBAR SPINE - COMPLETE 4+ VIEW COMPARISON:  None Available. FINDINGS: Limited evaluation due to overlapping osseous structures and overlying soft tissues. Multilevel severe degenerative changes of the spine. Multilevel thoracic vertebral body height loss with almost complete flattening of the T12 vertebral body. Multilevel lumbar vertebral body height loss. There is no evidence of thoracolumbar acute displaced spine fracture. In saturated kyphotic curvature at the T12 level. Grade 1 anterolisthesis of L4 on L5. Multilevel intervertebral disc space narrowing. No other significant bone abnormalities are identified. IMPRESSION: Multilevel likely chronic vertebral body height loss with almost complete flattening of the T12 level. Severe multilevel degenerative changes of the spine. Limited evaluation due to overlapping osseous structures and overlying soft tissues. Recommend cross-sectional imaging for further evaluation if concern for acute injury. Electronically Signed   By:  Tish Frederickson M.D.   On: 03/24/2023 22:46   DG Lumbar Spine Complete  Result Date: 03/24/2023 CLINICAL DATA:  fall, EXAM: THORACIC SPINE 2 VIEWS; LUMBAR SPINE - COMPLETE 4+ VIEW COMPARISON:  None Available. FINDINGS: Limited evaluation due to overlapping osseous structures and overlying soft tissues. Multilevel severe degenerative changes of the spine. Multilevel thoracic vertebral body height loss with almost complete flattening of the T12 vertebral body. Multilevel lumbar vertebral body height loss. There is no evidence of thoracolumbar acute displaced spine fracture. In saturated kyphotic curvature at the T12 level. Grade 1 anterolisthesis of L4 on L5. Multilevel intervertebral disc space narrowing. No other significant bone abnormalities are identified. IMPRESSION: Multilevel likely chronic vertebral body height loss with almost complete flattening of the T12 level. Severe multilevel degenerative changes of the spine. Limited evaluation due to overlapping osseous structures and overlying soft tissues. Recommend cross-sectional imaging for further evaluation if concern for acute injury. Electronically Signed   By: Tish Frederickson M.D.   On: 03/24/2023 22:46   CT Head Wo Contrast  Result Date: 03/24/2023 CLINICAL DATA:  Head and neck trauma and pain, on blood thinner EXAM: CT HEAD WITHOUT CONTRAST CT CERVICAL SPINE WITHOUT CONTRAST TECHNIQUE: Multidetector CT imaging of the head and cervical spine was performed following the standard protocol without intravenous contrast. Multiplanar CT image reconstructions of the cervical spine were also generated. RADIATION DOSE REDUCTION: This exam was performed according to the departmental dose-optimization program which includes automated exposure control, adjustment of the mA and/or kV according to patient size and/or use of iterative reconstruction technique. COMPARISON:  08/02/2022 CT head and cervical spine FINDINGS: CT HEAD FINDINGS Brain: No evidence of  acute infarct, hemorrhage, mass, mass effect, or midline shift. No hydrocephalus or extra-axial fluid collection. Age related cerebral atrophy. Periventricular white matter changes, likely the sequela of chronic small vessel ischemic disease. Lacunar infarcts in the bilateral basal ganglia. Vascular: No hyperdense vessel. Skull: Negative for fracture or focal lesion. Sinuses/Orbits: No acute finding. Status post bilateral lens replacements. Other: The mastoid air cells are well aerated. CT CERVICAL SPINE FINDINGS Alignment: No traumatic listhesis. Skull base and vertebrae: No acute fracture or suspicious osseous lesion. Chronic compression deformity of C5, unchanged. Soft tissues and spinal canal: No prevertebral fluid or swelling. No visible canal hematoma. Disc levels: Degenerative changes in the cervical spine.Moderate spinal canal stenosis at C4-C5 and C6-C7. Upper chest: No focal pulmonary opacity or pleural effusion. IMPRESSION: 1. No acute intracranial process. 2. No  acute fracture or traumatic listhesis in the cervical spine. Electronically Signed   By: Wiliam Ke M.D.   On: 03/24/2023 22:24   CT Cervical Spine Wo Contrast  Result Date: 03/24/2023 CLINICAL DATA:  Head and neck trauma and pain, on blood thinner EXAM: CT HEAD WITHOUT CONTRAST CT CERVICAL SPINE WITHOUT CONTRAST TECHNIQUE: Multidetector CT imaging of the head and cervical spine was performed following the standard protocol without intravenous contrast. Multiplanar CT image reconstructions of the cervical spine were also generated. RADIATION DOSE REDUCTION: This exam was performed according to the departmental dose-optimization program which includes automated exposure control, adjustment of the mA and/or kV according to patient size and/or use of iterative reconstruction technique. COMPARISON:  08/02/2022 CT head and cervical spine FINDINGS: CT HEAD FINDINGS Brain: No evidence of acute infarct, hemorrhage, mass, mass effect, or midline  shift. No hydrocephalus or extra-axial fluid collection. Age related cerebral atrophy. Periventricular white matter changes, likely the sequela of chronic small vessel ischemic disease. Lacunar infarcts in the bilateral basal ganglia. Vascular: No hyperdense vessel. Skull: Negative for fracture or focal lesion. Sinuses/Orbits: No acute finding. Status post bilateral lens replacements. Other: The mastoid air cells are well aerated. CT CERVICAL SPINE FINDINGS Alignment: No traumatic listhesis. Skull base and vertebrae: No acute fracture or suspicious osseous lesion. Chronic compression deformity of C5, unchanged. Soft tissues and spinal canal: No prevertebral fluid or swelling. No visible canal hematoma. Disc levels: Degenerative changes in the cervical spine.Moderate spinal canal stenosis at C4-C5 and C6-C7. Upper chest: No focal pulmonary opacity or pleural effusion. IMPRESSION: 1. No acute intracranial process. 2. No acute fracture or traumatic listhesis in the cervical spine. Electronically Signed   By: Wiliam Ke M.D.   On: 03/24/2023 22:24   CUP PACEART REMOTE DEVICE CHECK  Result Date: 03/07/2023 Scheduled remote reviewed. Normal device function.  Known PAF, on OAC according to previous reports, good ventricular rate control, AF burden is 1.1% of the time Next remote 91 days. ML, CVRS   Microbiology: No results found for this or any previous visit (from the past 240 hour(s)).   Labs: Basic Metabolic Panel: Recent Labs  Lab 03/25/23 2323 03/26/23 1232 03/28/23 0410 03/28/23 1930 03/29/23 0348  NA 132* 132* 126* 128* 132*  K 4.9 4.7 3.9 4.1 4.3  CL 100 99 96* 96* 99  CO2 24 23 21* 23 23  GLUCOSE 88 90 98 118* 82  BUN 16 16 27* 26* 21  CREATININE 0.82 0.85 1.27* 0.85 0.80  CALCIUM 9.3 9.2 9.1 9.3 9.2  MG  --   --  1.8  --  2.0  PHOS  --   --  3.8  --  3.3   Liver Function Tests: Recent Labs  Lab 03/25/23 2323 03/28/23 0410  AST 38 43*  ALT 25 24  ALKPHOS 77 78  BILITOT 0.7  1.1  PROT 6.1* 6.6  ALBUMIN 3.0* 3.1*   No results for input(s): "LIPASE", "AMYLASE" in the last 168 hours. No results for input(s): "AMMONIA" in the last 168 hours. CBC: Recent Labs  Lab 03/24/23 2103 03/25/23 0605 03/28/23 0410 03/29/23 0348  WBC 5.8 5.1 6.1 5.0  NEUTROABS  --   --  4.1  --   HGB 11.0* 11.0* 11.9* 11.1*  HCT 33.1* 33.7* 34.5* 32.3*  MCV 97.6 97.1 93.2 93.6  PLT 155 177 188 154   Cardiac Enzymes: No results for input(s): "CKTOTAL", "CKMB", "CKMBINDEX", "TROPONINI" in the last 168 hours. BNP: BNP (last 3 results) Recent Labs  03/28/23 0410  BNP 67.8    ProBNP (last 3 results) No results for input(s): "PROBNP" in the last 8760 hours.  CBG: No results for input(s): "GLUCAP" in the last 168 hours.     Signed:  Lacretia Nicks MD.  [facility] 03/29/2023, 9:00 AM

## 2023-03-29 NOTE — Progress Notes (Signed)
Report called to Marylene Land, Charity fundraiser at [facility]

## 2023-03-29 NOTE — Consult Note (Addendum)
[facility] Liaison Consult Note   03/29/2023  TEAGUN TIVNAN Apr 19, 1931 [id number]  Primary Care Provider:  Charlane Ferretti, DO with [facility] which follows for [facility] and [facility] for pharmacy needs only   Insurance: Medicare ACO REACH  Addendum: For SNF level of Care for rehab.  For questions or referrals, please contact:  Charlesetta Shanks, RN, BSN, CCM [place]  [facility], [facility]  [facility] Liaison Direct Dial: [phone] or secure chat Email: [email or web address]

## 2023-05-18 ENCOUNTER — Emergency Department (HOSPITAL_COMMUNITY): Payer: Medicare Other

## 2023-05-18 ENCOUNTER — Emergency Department (HOSPITAL_COMMUNITY)
Admission: EM | Admit: 2023-05-18 | Discharge: 2023-05-18 | Disposition: A | Discharge status: Skilled Nursing Facility | Payer: Medicare Other | Attending: Emergency Medicine | Admitting: Emergency Medicine

## 2023-05-18 ENCOUNTER — Other Ambulatory Visit: Payer: Self-pay

## 2023-05-18 ENCOUNTER — Encounter (HOSPITAL_COMMUNITY): Payer: Self-pay | Admitting: *Deleted

## 2023-05-18 DIAGNOSIS — R2243 Localized swelling, mass and lump, lower limb, bilateral: Secondary | ICD-10-CM | POA: Insufficient documentation

## 2023-05-18 DIAGNOSIS — Z950 Presence of cardiac pacemaker: Secondary | ICD-10-CM | POA: Insufficient documentation

## 2023-05-18 DIAGNOSIS — G20C Parkinsonism, unspecified: Secondary | ICD-10-CM | POA: Insufficient documentation

## 2023-05-18 DIAGNOSIS — R079 Chest pain, unspecified: Secondary | ICD-10-CM | POA: Insufficient documentation

## 2023-05-18 DIAGNOSIS — Z7901 Long term (current) use of anticoagulants: Secondary | ICD-10-CM | POA: Insufficient documentation

## 2023-05-18 LAB — COMPREHENSIVE METABOLIC PANEL
ALT: 25 U/L (ref 0–44)
AST: 37 U/L (ref 15–41)
Albumin: 3.4 g/dL — ABNORMAL LOW (ref 3.5–5.0)
Alkaline Phosphatase: 118 U/L (ref 38–126)
Anion gap: 8 (ref 5–15)
BUN: 15 mg/dL (ref 8–23)
CO2: 26 mmol/L (ref 22–32)
Calcium: 9.5 mg/dL (ref 8.9–10.3)
Chloride: 102 mmol/L (ref 98–111)
Creatinine, Ser: 0.72 mg/dL (ref 0.61–1.24)
GFR, Estimated: >60 mL/min (ref >60)
Glucose, Bld: 113 mg/dL — ABNORMAL HIGH (ref 70–99)
Potassium: 4.8 mmol/L (ref 3.5–5.1)
Sodium: 136 mmol/L (ref 135–145)
Total Bilirubin: 1.1 mg/dL (ref 0.0–1.2)
Total Protein: 6.7 g/dL (ref 6.5–8.1)

## 2023-05-18 LAB — CBC
HCT: 36.6 % — ABNORMAL LOW (ref 39.0–52.0)
Hemoglobin: 12.5 g/dL — ABNORMAL LOW (ref 13.0–17.0)
MCH: 32.7 pg (ref 26.0–34.0)
MCHC: 34.2 g/dL (ref 30.0–36.0)
MCV: 95.8 fL (ref 80.0–100.0)
Platelets: 281 10*3/uL (ref 150–400)
RBC: 3.82 MIL/uL — ABNORMAL LOW (ref 4.22–5.81)
RDW: 13.1 % (ref 11.5–15.5)
WBC: 3.5 10*3/uL — ABNORMAL LOW (ref 4.0–10.5)
nRBC: 0 % (ref 0.0–0.2)

## 2023-05-18 LAB — TROPONIN I (HIGH SENSITIVITY)
Troponin I (High Sensitivity): 27 ng/L — ABNORMAL HIGH (ref <18)
Troponin I (High Sensitivity): 30 ng/L — ABNORMAL HIGH (ref <18)

## 2023-05-18 MED ORDER — IBUPROFEN 400 MG PO TABS
600.0000 mg | ORAL_TABLET | Freq: Once | ORAL | Status: AC
  Administered 2023-05-18: 600 mg via ORAL
  Filled 2023-05-18: qty 1

## 2023-05-18 NOTE — ED Triage Notes (Signed)
 Patient presents to ed via [facility] states at 330am to use his urinal and dev left flank pain that radiated to his left chest , left shoulder and arm. Denies n/v

## 2023-05-18 NOTE — ED Notes (Signed)
 Patient endorses generalized pressure from his left shoulder down into his left leg 8/10, constant in nature.

## 2023-05-18 NOTE — ED Notes (Signed)
 Attempt to call facility for report x2

## 2023-05-18 NOTE — ED Notes (Signed)
 [facility] called

## 2023-05-18 NOTE — ED Notes (Signed)
 Attempt to call facility x3 without success.

## 2023-05-18 NOTE — ED Notes (Signed)
 Patient resting comfortably at this time, RN and PA at bedside

## 2023-05-18 NOTE — Discharge Instructions (Addendum)
 Thank you for letting us  evaluate you today.  It appears that your chest pain is a little more musculoskeletal in nature as it is reproducible with palpation.  However, your ED workup is negative for injury to heart.  Your chest x-ray is negative for pneumonia.  All other labs are within normal limits.  We interrogated your pacemaker and there was 1 episode of atrial flutter prior to you waking up and could have contributed to symptoms.  We gave you 1 dose of ibuprofen  here which improved her pain significantly.  Return to emergency department if you experience chest pain, shortness of breath, worsening of symptoms.  However you can follow-up with your cardiologist outpatient

## 2023-05-18 NOTE — ED Provider Triage Note (Signed)
 Emergency Medicine Provider Triage Evaluation Note  Kenneth Bowen , a 89 y.o. male  was evaluated in triage.  Pt complains of left-sided chest pain as well as left-sided abdominal pain.  Reports that he woke up this morning to use the bathroom and had a sudden onset of this pain that lasted less than 5 minutes.  Denies any shortness of breath, lightheadedness, dizziness or weakness associated.  Reports he takes a blood thinner for previous history of blood clot.  Denies fevers at home.  Denies nausea or vomiting.  Denies falling.  Denies leg swelling beyond his baseline.  Review of Systems  Positive:  Negative:   Physical Exam  There were no vitals taken for this visit. Gen:   Awake, no distress   Resp:  Normal effort  MSK:   Moves extremities without difficulty  Other:    Medical Decision Making  Medically screening exam initiated at 5:15 AM.  Appropriate orders placed.  Nancyann LITTIE Piascik was informed that the remainder of the evaluation will be completed by another provider, this initial triage assessment does not replace that evaluation, and the importance of remaining in the ED until their evaluation is complete.     Ruthell Lonni FALCON, PA-C 05/18/23 [phone]

## 2023-05-18 NOTE — ED Notes (Signed)
 This RN spoke with [place] at [facility]. Per [place], no medical staff to take report at this time. This RN left name and number to be called back so report can be called.

## 2023-05-18 NOTE — ED Provider Notes (Signed)
 [place] EMERGENCY DEPARTMENT AT  HOSPITAL Provider Note   CSN: [id number] Arrival date & time: 05/18/23  9493     History  Chief Complaint  Patient presents with   Chest Pain    Kenneth Bowen is a 89 y.o. male with past medical history of parkinsonian features with gait impairment, TIA, BPH, HLD, seizures, DVT, PE (on Eliquis ), pacemaker presents to emergency department via EMS for evaluation of chest pain. He reports that he woke up to use restroom this morning at 0330 and when he got back into bed, he started to experience left-sided chest pain and difficulty with getting comfortable.  At that time, he complained of 10/10 sharp chest pain with radiation to left shoulder.  Pain now is 4/10.  He also states that he was told by possibly EMS that his rhythm was not regular.  He denies complaints prior to tonight, recent infection, shortness of breath.  The history is provided by the patient. No language interpreter was used.  Chest Pain Pain location:  L chest Pain radiates to:  Does not radiate Associated symptoms: no abdominal pain, no cough, no dizziness, no fatigue, no fever, no headache, no nausea, no numbness, no palpitations, no shortness of breath, no vomiting and no weakness      Home Medications Prior to Admission medications   Medication Sig Start Date End Date Taking? Authorizing Provider  ALPRAZolam  (XANAX ) 0.5 MG tablet Take 0.5 mg by mouth at bedtime as needed for sleep. 10/28/20   [provider]  apixaban  (ELIQUIS ) 2.5 MG TABS tablet Take 1 tablet (2.5 mg total) by mouth 2 (two) times daily. Resume from 06/10/22 08/07/22   Odell Celinda Balo, MD  ARTIFICIAL TEAR OP Place 1 drop into both eyes at bedtime as needed (dry eye).    [provider]  cholecalciferol  (VITAMIN D ) 1000 UNITS tablet Take 2,000 Units by mouth daily.     [provider]  desoximetasone (TOPICORT) 0.25 % cream Apply 1 Application topically at bedtime.  06/17/19   [provider]  divalproex  (DEPAKOTE  ER) 500 MG 24 hr tablet TAKE 1 TABLET (500 MG TOTAL) BY MOUTH DAILY. Patient taking differently: Take 500 mg by mouth at bedtime. 03/14/23   Whitfield Raisin, NP  Ferrous Sulfate  (IRON  PO) Take 1 tablet by mouth daily. Patient not taking: Reported on 03/25/2023    [provider]  folic acid  (FOLVITE ) 1 MG tablet Take 1 tablet (1 mg total) by mouth daily. Patient not taking: Reported on 03/25/2023 06/10/22   Cheryle Page, MD  furosemide  (LASIX ) 40 MG tablet Take 40 mg by mouth daily.    [provider]  lactulose  (CHRONULAC ) 10 GM/15ML solution Take 15 mLs (10 g total) by mouth 2 (two) times daily. Patient taking differently: Take 10 g by mouth daily as needed for mild constipation. 06/09/22   Cheryle Page, MD  mometasone (ELOCON) 0.1 % ointment Apply 1 application topically 2 (two) times daily as needed (rash).  06/23/19   [provider]  Multiple Vitamin (MULTIVITAMIN) tablet Take 1 tablet by mouth daily.    [provider]  mupirocin  ointment (BACTROBAN ) 2 % Apply 1 Application topically 2 (two) times daily. 07/09/22   [provider]  omeprazole (PRILOSEC) 20 MG capsule Take 20 mg by mouth daily. 12/29/18   [provider]  tamsulosin  (FLOMAX ) 0.4 MG CAPS capsule TAKE 1 CAPSULE (0.4 MG TOTAL) BY MOUTH DAILY AFTER SUPPER. Patient taking differently: Take 0.4 mg by mouth daily.  03/21/16   Patrcia Cough, MD  topiramate  (TOPAMAX ) 50 MG tablet Take 1 tablet (50 mg total) by mouth at bedtime. 12/27/22   Whitfield Raisin, NP  traMADol  (ULTRAM ) 50 MG tablet Take 1 tablet (50 mg total) by mouth every 6 (six) hours as needed for moderate pain. 08/06/22   Odell Celinda Balo, MD  traZODone  (DESYREL ) 50 MG tablet Take 50 mg by mouth at bedtime. 12/13/20   [provider]  triamcinolone cream (KENALOG) 0.1 % Apply 1 Application topically as directed. 03/14/23   [provider]       Allergies    Tape    Review of Systems   Review of Systems  Constitutional:  Negative for chills, fatigue and fever.  Respiratory:  Negative for cough, chest tightness, shortness of breath and wheezing.   Cardiovascular:  Positive for chest pain. Negative for palpitations.  Gastrointestinal:  Negative for abdominal pain, constipation, diarrhea, nausea and vomiting.  Musculoskeletal:  Positive for arthralgias.  Neurological:  Negative for dizziness, seizures, weakness, light-headedness, numbness and headaches.    Physical Exam Updated Vital Signs BP (!) 155/90 (BP Location: Right Arm)   Pulse 60   Temp 97.6 F (36.4 C) (Oral)   Resp 16   Ht 5' 10 (1.778 m)   Wt 80.3 kg   SpO2 95%   BMI 25.40 kg/m  Physical Exam Vitals and nursing note reviewed.  Constitutional:      General: He is not in acute distress.    Appearance: Normal appearance. He is not ill-appearing or diaphoretic.  HENT:     Head: Normocephalic and atraumatic.     Nose: Nose normal.  Eyes:     General: No scleral icterus.       Right eye: No discharge.        Left eye: No discharge.     Extraocular Movements: Extraocular movements intact.     Conjunctiva/sclera: Conjunctivae normal.     Pupils: Pupils are equal, round, and reactive to light.  Cardiovascular:     Rate and Rhythm: Normal rate.     Pulses:          Dorsalis pedis pulses are 2+ on the right side and 2+ on the left side.     Comments: Paced rhythm Pulmonary:     Effort: Pulmonary effort is normal. No respiratory distress.     Breath sounds: Normal breath sounds.  Abdominal:     General: Bowel sounds are normal. There is no distension.     Palpations: Abdomen is soft.     Tenderness: There is no abdominal tenderness. There is no right CVA tenderness, left CVA tenderness, guarding or rebound.  Musculoskeletal:     Right lower leg: 1+ Pitting Edema present.     Left lower leg: 1+ Pitting Edema present.     Comments: Tenderness to  palpation of anterior chest wall and left shoulder.  Describes pain as achy. No calf tenderness  Skin:    Coloration: Skin is not jaundiced or pale.  Neurological:     Mental Status: He is alert and oriented to person, place, and time. Mental status is at baseline.     ED Results / Procedures / Treatments   Labs (all labs ordered are listed, but only abnormal results are displayed) Labs Reviewed  CBC - Abnormal; Notable for the following components:      Result Value   WBC 3.5 (*)    RBC 3.82 (*)    Hemoglobin 12.5 (*)  HCT 36.6 (*)    All other components within normal limits  COMPREHENSIVE METABOLIC PANEL - Abnormal; Notable for the following components:   Glucose, Bld 113 (*)    Albumin 3.4 (*)    All other components within normal limits  TROPONIN I (HIGH SENSITIVITY) - Abnormal; Notable for the following components:   Troponin I (High Sensitivity) 27 (*)    All other components within normal limits  TROPONIN I (HIGH SENSITIVITY) - Abnormal; Notable for the following components:   Troponin I (High Sensitivity) 30 (*)    All other components within normal limits    EKG EKG Interpretation Date/Time:  Saturday May 18 2023 05:29:05 EST Ventricular Rate:  73 PR Interval:  206 QRS Duration:  156 QT Interval:  474 QTC Calculation: 522 R Axis:   -34  Text Interpretation: Atrial-sensed ventricular-paced rhythm with occasional Premature ventricular complexes Abnormal ECG No significant change since last tracing Confirmed by Midge Golas [phone]) on 05/18/2023 6:43:54 AM  Radiology DG Chest Port 1 View Result Date: 05/18/2023 CLINICAL DATA:  89 year old male with history of chest pain. EXAM: PORTABLE CHEST 1 VIEW COMPARISON:  Chest x-ray 03/28/2023. FINDINGS: Lung volumes are low. Bibasilar opacities favored to reflect areas of subsegmental atelectasis or chronic scarring. No definite consolidative airspace disease. No pleural effusions. No pneumothorax. No evidence of  pulmonary edema. Heart size is normal. Upper mediastinal contours are within normal limits. Left-sided pacemaker device in place with lead tips projecting over the expected location of the right atrium and right ventricle. Atherosclerotic calcifications in the thoracic aorta. IMPRESSION: 1. Low lung volumes without definite radiographic evidence of acute cardiopulmonary disease. 2. Aortic atherosclerosis. Electronically Signed   By: Toribio Aye M.D.   On: 05/18/2023 06:24    Procedures Procedures    Medications Ordered in ED Medications  ibuprofen  (ADVIL ) tablet 600 mg (600 mg Oral Given 05/18/23 0813)    ED Course/ Medical Decision Making/ A&P Clinical Course as of 05/18/23 1530  Sat May 18, 2023  [id number] 89 year old male presented by ambulance from home with left chest and side pain when he got up to go to bathroom.  Pain was initially severe.  Priors.  First troponin mildly elevated but with his priors.  Second troponin.  Will need reassessment [MB]    Clinical Course User Index [MB] Towana Ozell BROCKS, MD                                 Medical Decision Making  Patient presents to the ED for concern of chest pain, this involves an extensive number of treatment options, and is a complaint that carries with it a high risk of complications and morbidity.  The differential diagnosis includes ACS, infection, pneumonia, MSK   Co morbidities that complicate the patient evaluation  All noted above   Additional history obtained:  Additional history obtained from EMS, Family, Nursing, and Outside Medical Records   External records from outside source obtained and reviewed including  EMS and triage RN note Recent medical evaluations and medication list   Lab Tests:  I Ordered, and personally interpreted labs.  The pertinent results include:   2 flat troponins (27 and 30-baseline is 30-35 over past year) Hemoglobin 12.5 (baseline 10.6-11.9 over past 9 months) Glucose 113 Albumin  3.4 (baseline 3-3.1 over past month)   Imaging Studies ordered:  I ordered imaging studies including chest x-ray I independently visualized and interpreted imaging which  showed neck for pneumonia or acute cardiopulmonary disease I agree with the radiologist interpretation   Cardiac Monitoring:  The patient was maintained on a cardiac monitor.  I personally viewed and interpreted the cardiac monitored which showed an underlying rhythm of: Paced rhythm and unchanged from prior   Medicines ordered and prescription drug management:  I ordered medication including ibuprofen  for pain Reevaluation of the patient after these medicines showed that the patient improved I have reviewed the patients home medicines and have made adjustments as needed    Problem List / ED Course:  Chest pain On physical exam, tenderness to palpation of anterior chest wall and left shoulder.  Appears to be more MSK in nature. Flat troponins of 27 and 30 (baseline 30-35 over past 11 months).  EKG shows paced rhythm with unchanged from prior EKG.   Pacer interrogation shows 1 atrial flutter rhythm at midnight Will have patient follow-up with their cardiologist regarding pacemaker findings and chest pain Patient reports significant improvement of pain following ibuprofen  and rest in ED  Reevaluation:  After the interventions noted above, I reevaluated the patient and found that they have :improved   Social Determinants of Health:  Has cardiologist follow-up   Dispostion:  After consideration of the diagnostic results and the patients response to treatment, I feel that the patent would benefit from outpatient cardiology follow-up.   Discussed patient, ED workup with Dr. Towana who agrees with plan Final Clinical Impression(s) / ED Diagnoses Final diagnoses:  Chest pain, unspecified type    Rx / DC Orders ED Discharge Orders          Ordered    Ambulatory referral to Cardiology       Comments: If  you have not heard from the Cardiology office within the next 72 hours please call [phone].   05/18/23 1021              Minnie Tinnie BRAVO, PA 05/18/23 1530    Towana Ozell BROCKS, MD 05/18/23 1726

## 2023-05-18 NOTE — ED Notes (Signed)
Pacemaker interrogated by this RN

## 2023-05-18 NOTE — ED Notes (Signed)
 Joni Reining from [place]. [facility] contacted this RN, one episode of atrial flutter this morning at 1234, report faxed to [phone].

## 2023-05-20 NOTE — Progress Notes (Addendum)
 Cardiology Office Note    Date:  05/21/2023  ID:  Kenneth Bowen, DOB 03-31-1931, MRN [id number] PCP:  Valentin Skates, DO  Cardiologist:  Alm Clay, MD  Electrophysiologist:  Danelle Birmingham, MD   Chief Complaint: Hospital follow up   History of Present Illness: .    Kenneth Bowen is a 89 y.o. male with visit-pertinent history of HB s/p PPM seizures, DVT on Eliquis , RLS, prostate cancer  First evaluated by cardiology service in on 06/03/2022 after patient was admitted from his assisted living facility following a syncopal episode.  He was found to be hypothermic and bradycardic.  He was seen by EP team on 06/03/2022 who recommended observation given sepsis however on the evening of 06/02/2022 he had sinus arrest with asystole and underwent emergent transvenous pacemaker placement.  Was admitted underwent PPM placement on 06/04/2023.  Following had a seizure on 06/07/2022, Depakote  dose increased.  Patiently recently admitted from 03/24/2023 through 03/29/23  following a fall.  Patient reported he was transferring from wheelchair to the commode and ended up falling and hit his head.  CT head and cervical spine with no acute abnormalities.  He was admitted with worsening generalized weakness and back pain.  Overall imaging showed T12 compression fracture with progressive, severe height loss since 07/2022, chronic L1-L5 compression fractures.  His hospitalization was complicated by delirium which improved prior to discharge.  On 05/18/2023 patient presented to the ED via EMS.  He reported that he woke up to use the restroom that morning and when he got back into bed he started experiencing left-sided chest pain and difficulty with getting comfortable.  He then experienced 10 out of 10 sharp chest pain with radiation to left shoulder.  He had tenderness to palpation on anterior chest wall and left shoulder.  He noted significant improvement in pain following ibuprofen  and rest. HsTrop 27>>35, similar to  priors.  Pacer interrogation showed 1 episode of atrial flutter.  Today he presents for hospital follow-up.  He reports that he is not feeling very well overall, he feels more weak than he normally does.  His caregiver presented with him today, she reports that he has been increasingly weak in the last few days and has seemed somewhat confused.  She is concerned for possible infection.  He denies any further chest pain, shortness of breath or palpitations.  He denies any dizziness, presyncope or syncope.  However he does note that he is unable to get up and walk around in recent weeks.   ROS: .   Today he denies chest pain, shortness of breath, palpitations, melena, hematuria, hemoptysis, diaphoresis, weakness, presyncope, syncope, orthopnea, and PND.  All other systems are reviewed and otherwise negative. Studies Reviewed: SABRA    EKG:  EKG not ordered.   CV Studies: Cardiac Studies & Procedures   CARDIAC CATHETERIZATION  CARDIAC CATHETERIZATION 06/03/2022  Narrative Successful TVP. PPM tomorrow per EP.  Toribio Fuel, MD 7:38 PM    ECHOCARDIOGRAM  ECHOCARDIOGRAM COMPLETE 08/04/2021  Narrative ECHOCARDIOGRAM REPORT    Patient Name:   Kenneth Bowen Date of Exam: 08/04/2021 Medical Rec #:  [id number]         Height:       70.0 in Accession #:    [id number]        Weight:       178.9 lb Date of Birth:  18-Aug-1930          BSA:  1.991 m Patient Age:    89 years          BP:           132/69 mmHg Patient Gender: M                 HR:           78 bpm. Exam Location:  [address]  Procedure: 2D Echo, Cardiac Doppler and Color Doppler  Indications:    R60.0 Lower extremity edema  History:        Patient has prior history of Echocardiogram examinations, most recent 07/13/2016. TIA, Signs/Symptoms:Syncope; Risk Factors:Dyslipidemia. Seizures. History of DVT.  Sonographer:    Jon Hacker RCS Referring Phys: AUSTIN SKAKLE  IMPRESSIONS   1. Left ventricular  ejection fraction, by estimation, is 60 to 65%. The left ventricle has normal function. The left ventricle has no regional wall motion abnormalities. Left ventricular diastolic parameters are indeterminate. 2. Right ventricular systolic function is normal. The right ventricular size is mildly enlarged. Tricuspid regurgitation signal is inadequate for assessing PA pressure. 3. The mitral valve is grossly normal. Trivial mitral valve regurgitation. 4. The aortic valve is grossly normal. There is moderate calcification of the aortic valve. Aortic valve regurgitation is not visualized. 5. The inferior vena cava is normal in size with greater than 50% respiratory variability, suggesting right atrial pressure of 3 mmHg.  Comparison(s): No prior Echocardiogram.  FINDINGS Left Ventricle: Left ventricular ejection fraction, by estimation, is 60 to 65%. The left ventricle has normal function. The left ventricle has no regional wall motion abnormalities. The left ventricular internal cavity size was normal in size. There is borderline asymmetric left ventricular hypertrophy. Left ventricular diastolic parameters are indeterminate.  Right Ventricle: The right ventricular size is mildly enlarged. No increase in right ventricular wall thickness. Right ventricular systolic function is normal. Tricuspid regurgitation signal is inadequate for assessing PA pressure.  Left Atrium: Left atrial size was normal in size.  Right Atrium: Right atrial size was normal in size.  Pericardium: There is no evidence of pericardial effusion.  Mitral Valve: The mitral valve is grossly normal. Trivial mitral valve regurgitation.  Tricuspid Valve: The tricuspid valve is grossly normal. Tricuspid valve regurgitation is trivial.  Aortic Valve: The aortic valve is grossly normal. There is moderate calcification of the aortic valve. Aortic valve regurgitation is not visualized. Aortic valve mean gradient measures 8.0 mmHg. Aortic  valve peak gradient measures 15.5 mmHg. Aortic valve area, by VTI measures 1.51 cm.  Pulmonic Valve: The pulmonic valve was grossly normal. Pulmonic valve regurgitation is not visualized.  Aorta: The aortic root and ascending aorta are structurally normal, with no evidence of dilitation.  Venous: The inferior vena cava is normal in size with greater than 50% respiratory variability, suggesting right atrial pressure of 3 mmHg.  IAS/Shunts: No atrial level shunt detected by color flow Doppler.   LEFT VENTRICLE PLAX 2D LVIDd:         4.30 cm   Diastology LVIDs:         2.60 cm   LV e' lateral:   13.10 cm/s LV PW:         0.70 cm   LV E/e' lateral: 4.5 LV IVS:        1.20 cm LVOT diam:     2.30 cm LV SV:         65 LV SV Index:   33 LVOT Area:     4.15 cm   RIGHT  VENTRICLE RV Basal diam:  4.30 cm RV S prime:     11.90 cm/s TAPSE (M-mode): 2.2 cm  LEFT ATRIUM             Index        RIGHT ATRIUM           Index LA diam:        4.00 cm 2.01 cm/m   RA Pressure: 8.00 mmHg LA Vol (A2C):   80.2 ml 40.29 ml/m  RA Area:     19.90 cm LA Vol (A4C):   62.4 ml 31.35 ml/m  RA Volume:   44.70 ml  22.45 ml/m LA Biplane Vol: 71.4 ml 35.87 ml/m AORTIC VALVE AV Area (Vmax):    1.60 cm AV Area (Vmean):   1.49 cm AV Area (VTI):     1.51 cm AV Vmax:           197.00 cm/s AV Vmean:          130.000 cm/s AV VTI:            0.433 m AV Peak Grad:      15.5 mmHg AV Mean Grad:      8.0 mmHg LVOT Vmax:         75.70 cm/s LVOT Vmean:        46.500 cm/s LVOT VTI:          0.157 m LVOT/AV VTI ratio: 0.36  AORTA Ao Root diam: 3.50 cm Ao Asc diam:  3.20 cm  MITRAL VALVE               TRICUSPID VALVE MV Area (PHT): 3.26 cm    Estimated RAP:  8.00 mmHg MV Decel Time: 233 msec MV E velocity: 59.20 cm/s  SHUNTS MV A velocity: 80.70 cm/s  Systemic VTI:  0.16 m MV E/A ratio:  0.73        Systemic Diam: 2.30 cm  Ronal Ross Electronically signed by Ronal Ross Signature Date/Time:  08/04/2021/3:44:00 PM    Final             Current Reported Medications:.    Current Meds  Medication Sig   ALPRAZolam  (XANAX ) 0.5 MG tablet Take 0.5 mg by mouth at bedtime as needed for sleep.   apixaban  (ELIQUIS ) 2.5 MG TABS tablet Take 1 tablet (2.5 mg total) by mouth 2 (two) times daily. Resume from 06/10/22   ARTIFICIAL TEAR OP Place 1 drop into both eyes at bedtime as needed (dry eye).   cholecalciferol  (VITAMIN D ) 1000 UNITS tablet Take 2,000 Units by mouth daily.    desoximetasone (TOPICORT) 0.25 % cream Apply 1 Application topically at bedtime.   divalproex  (DEPAKOTE  ER) 500 MG 24 hr tablet TAKE 1 TABLET (500 MG TOTAL) BY MOUTH DAILY. (Patient taking differently: Take 500 mg by mouth at bedtime.)   Ferrous Sulfate  (IRON  PO) Take 1 tablet by mouth daily.   folic acid  (FOLVITE ) 1 MG tablet Take 1 tablet (1 mg total) by mouth daily.   furosemide  (LASIX ) 40 MG tablet Take 40 mg by mouth daily.   lactulose  (CHRONULAC ) 10 GM/15ML solution Take 15 mLs (10 g total) by mouth 2 (two) times daily. (Patient taking differently: Take 10 g by mouth daily as needed for mild constipation.)   mometasone (ELOCON) 0.1 % ointment Apply 1 application topically 2 (two) times daily as needed (rash).    Multiple Vitamin (MULTIVITAMIN) tablet Take 1 tablet by mouth daily.   mupirocin  ointment (BACTROBAN ) 2 % Apply  1 Application topically 2 (two) times daily.   omeprazole (PRILOSEC) 20 MG capsule Take 20 mg by mouth daily.   tamsulosin  (FLOMAX ) 0.4 MG CAPS capsule TAKE 1 CAPSULE (0.4 MG TOTAL) BY MOUTH DAILY AFTER SUPPER. (Patient taking differently: Take 0.4 mg by mouth daily.)   topiramate  (TOPAMAX ) 50 MG tablet Take 1 tablet (50 mg total) by mouth at bedtime.   traMADol  (ULTRAM ) 50 MG tablet Take 1 tablet (50 mg total) by mouth every 6 (six) hours as needed for moderate pain.   traZODone  (DESYREL ) 50 MG tablet Take 50 mg by mouth at bedtime.   triamcinolone cream (KENALOG) 0.1 % Apply 1 Application  topically as directed.   Physical Exam:    VS:  BP (!) 80/52   Pulse 66   Ht 5' 10 (1.778 m)   Wt 164 lb (74.4 kg)   SpO2 96%   BMI 23.53 kg/m    Wt Readings from Last 3 Encounters:  05/21/23 164 lb (74.4 kg)  05/18/23 177 lb (80.3 kg)  03/29/23 181 lb 14.1 oz (82.5 kg)   GEN: Well nourished NECK: No JVD; No carotid bruits CARDIAC: RRR, no murmurs, rubs, gallops RESPIRATORY:  Clear to auscultation without rales, wheezing or rhonchi  ABDOMEN: Soft, non-tender, non-distended EXTREMITIES:  Mild lower extremity edema; No acute deformity  Asessement and Plan:SABRA    Hypotension: Patient's initial blood pressure today 84/56, on recheck was 80/52.  He denies any dizziness or lightheadedness, presyncope or syncope.  He notes that he overall just does not feel well and feels that he is weaker than his normal.  His caregiver reports that he seems slightly more confused.  He denies any cough or recent fevers.  Reports he took his Lasix  40 mg daily this morning. Discussed with Dr. Mona, DOD at [facility] today.  It was recommended that he present to the ED for further evaluation, caregiver questions if possible UTI.  EMS transport offered, caregiver reports that she will transport him to [facility].  Chest pain: Patient presented to the ED on 05/18/2023 for chest pain after moving from his wheelchair to his bed.  In the ED chest he had tenderness on palpation. HsTrop 27>>35, similar to priors.  His discomfort improved following ibuprofen . Today he reports no further chest discomfort, he denies any shortness of breath.  Hx of PAF: Noted to have 1 episode of atrial flutter overnight on 05/18/23.  Most recent device check on 03/06/2023 indicated an A-fib burden of 1.1% with good ventricular rate control.  On Eliquis  2.5 mg twice daily for history of DVT, will need to consider increasing to 5 mg twice daily as he does not meet decreased dose requirements given episodes of PAF, however does have increased  fall risk. Will defer to primary cardiologist.  Addendum: Discussed with Dr. Anner, he recommended patient remain on Eliquis  2.5 mg twice daily given history of frequent falls.   Hx CHB: s/p PPM in 05/2022. Followed by EP.   DVT on Eliquis : Managed by PCP.    Disposition: Transport to ED by caregiver, follow up pending ED evaluation.   Signed, Leathie Weich D Edyth Glomb, NP

## 2023-05-21 ENCOUNTER — Encounter: Payer: Self-pay | Admitting: Cardiology

## 2023-05-21 ENCOUNTER — Other Ambulatory Visit: Payer: Self-pay

## 2023-05-21 ENCOUNTER — Emergency Department (HOSPITAL_COMMUNITY): Payer: Medicare Other

## 2023-05-21 ENCOUNTER — Inpatient Hospital Stay (HOSPITAL_COMMUNITY)
Admission: EM | Admit: 2023-05-21 | Discharge: 2023-05-28 | DRG: 315 | Disposition: A | Discharge status: Skilled Nursing Facility | Payer: Medicare Other | Source: Skilled Nursing Facility | Attending: Internal Medicine | Admitting: Internal Medicine

## 2023-05-21 ENCOUNTER — Encounter: Payer: Self-pay | Admitting: Physician Assistant

## 2023-05-21 ENCOUNTER — Encounter (HOSPITAL_COMMUNITY): Payer: Self-pay

## 2023-05-21 ENCOUNTER — Ambulatory Visit (INDEPENDENT_AMBULATORY_CARE_PROVIDER_SITE_OTHER): Payer: Medicare Other | Admitting: Cardiology

## 2023-05-21 VITALS — BP 80/52 | HR 66 | Ht 70.0 in | Wt 164.0 lb

## 2023-05-21 DIAGNOSIS — Z66 Do not resuscitate: Secondary | ICD-10-CM | POA: Diagnosis present

## 2023-05-21 DIAGNOSIS — R680 Hypothermia, not associated with low environmental temperature: Secondary | ICD-10-CM | POA: Diagnosis present

## 2023-05-21 DIAGNOSIS — N400 Enlarged prostate without lower urinary tract symptoms: Secondary | ICD-10-CM | POA: Diagnosis present

## 2023-05-21 DIAGNOSIS — I480 Paroxysmal atrial fibrillation: Secondary | ICD-10-CM | POA: Diagnosis present

## 2023-05-21 DIAGNOSIS — Z79899 Other long term (current) drug therapy: Secondary | ICD-10-CM | POA: Diagnosis not present

## 2023-05-21 DIAGNOSIS — R410 Disorientation, unspecified: Secondary | ICD-10-CM | POA: Diagnosis present

## 2023-05-21 DIAGNOSIS — R0789 Other chest pain: Secondary | ICD-10-CM

## 2023-05-21 DIAGNOSIS — I4892 Unspecified atrial flutter: Secondary | ICD-10-CM | POA: Diagnosis present

## 2023-05-21 DIAGNOSIS — Z7901 Long term (current) use of anticoagulants: Secondary | ICD-10-CM

## 2023-05-21 DIAGNOSIS — Z950 Presence of cardiac pacemaker: Secondary | ICD-10-CM | POA: Insufficient documentation

## 2023-05-21 DIAGNOSIS — Z8673 Personal history of transient ischemic attack (TIA), and cerebral infarction without residual deficits: Secondary | ICD-10-CM | POA: Diagnosis not present

## 2023-05-21 DIAGNOSIS — R5381 Other malaise: Secondary | ICD-10-CM | POA: Diagnosis present

## 2023-05-21 DIAGNOSIS — G40909 Epilepsy, unspecified, not intractable, without status epilepticus: Secondary | ICD-10-CM | POA: Diagnosis present

## 2023-05-21 DIAGNOSIS — G2581 Restless legs syndrome: Secondary | ICD-10-CM | POA: Diagnosis present

## 2023-05-21 DIAGNOSIS — F411 Generalized anxiety disorder: Secondary | ICD-10-CM | POA: Diagnosis present

## 2023-05-21 DIAGNOSIS — E785 Hyperlipidemia, unspecified: Secondary | ICD-10-CM | POA: Diagnosis present

## 2023-05-21 DIAGNOSIS — Z86718 Personal history of other venous thrombosis and embolism: Secondary | ICD-10-CM | POA: Diagnosis not present

## 2023-05-21 DIAGNOSIS — A419 Sepsis, unspecified organism: Secondary | ICD-10-CM | POA: Diagnosis present

## 2023-05-21 DIAGNOSIS — D649 Anemia, unspecified: Secondary | ICD-10-CM | POA: Diagnosis present

## 2023-05-21 DIAGNOSIS — R079 Chest pain, unspecified: Secondary | ICD-10-CM | POA: Diagnosis present

## 2023-05-21 DIAGNOSIS — I959 Hypotension, unspecified: Secondary | ICD-10-CM

## 2023-05-21 DIAGNOSIS — T68XXXA Hypothermia, initial encounter: Secondary | ICD-10-CM | POA: Diagnosis not present

## 2023-05-21 DIAGNOSIS — Z86711 Personal history of pulmonary embolism: Secondary | ICD-10-CM

## 2023-05-21 DIAGNOSIS — G4700 Insomnia, unspecified: Secondary | ICD-10-CM | POA: Diagnosis present

## 2023-05-21 DIAGNOSIS — Z91048 Other nonmedicinal substance allergy status: Secondary | ICD-10-CM | POA: Diagnosis not present

## 2023-05-21 DIAGNOSIS — Z8546 Personal history of malignant neoplasm of prostate: Secondary | ICD-10-CM

## 2023-05-21 DIAGNOSIS — Z9181 History of falling: Secondary | ICD-10-CM | POA: Diagnosis not present

## 2023-05-21 DIAGNOSIS — I442 Atrioventricular block, complete: Secondary | ICD-10-CM | POA: Insufficient documentation

## 2023-05-21 LAB — URINALYSIS, ROUTINE W REFLEX MICROSCOPIC
Bilirubin Urine: NEGATIVE
Glucose, UA: NEGATIVE mg/dL
Hgb urine dipstick: NEGATIVE
Ketones, ur: NEGATIVE mg/dL
Leukocytes,Ua: NEGATIVE
Nitrite: NEGATIVE
Protein, ur: NEGATIVE mg/dL
Specific Gravity, Urine: 1.015 (ref 1.005–1.030)
pH: 5 (ref 5.0–8.0)

## 2023-05-21 LAB — CBC WITH DIFFERENTIAL/PLATELET
Abs Immature Granulocytes: 0.07 10*3/uL (ref 0.00–0.07)
Basophils Absolute: 0 10*3/uL (ref 0.0–0.1)
Basophils Relative: 1 %
Eosinophils Absolute: 0 10*3/uL (ref 0.0–0.5)
Eosinophils Relative: 1 %
HCT: 35.7 % — ABNORMAL LOW (ref 39.0–52.0)
Hemoglobin: 12.3 g/dL — ABNORMAL LOW (ref 13.0–17.0)
Immature Granulocytes: 1 %
Lymphocytes Relative: 16 %
Lymphs Abs: 0.8 10*3/uL (ref 0.7–4.0)
MCH: 33.4 pg (ref 26.0–34.0)
MCHC: 34.5 g/dL (ref 30.0–36.0)
MCV: 97 fL (ref 80.0–100.0)
Monocytes Absolute: 0.5 10*3/uL (ref 0.1–1.0)
Monocytes Relative: 10 %
Neutro Abs: 3.8 10*3/uL (ref 1.7–7.7)
Neutrophils Relative %: 71 %
Platelets: 220 10*3/uL (ref 150–400)
RBC: 3.68 MIL/uL — ABNORMAL LOW (ref 4.22–5.81)
RDW: 13.6 % (ref 11.5–15.5)
WBC: 5.3 10*3/uL (ref 4.0–10.5)
nRBC: 0.4 % — ABNORMAL HIGH (ref 0.0–0.2)

## 2023-05-21 LAB — TROPONIN I (HIGH SENSITIVITY)
Troponin I (High Sensitivity): 40 ng/L — ABNORMAL HIGH (ref <18)
Troponin I (High Sensitivity): 38 ng/L — ABNORMAL HIGH (ref <18)

## 2023-05-21 LAB — I-STAT CG4 LACTIC ACID, ED
Lactic Acid, Venous: 1.6 mmol/L (ref 0.5–1.9)
Lactic Acid, Venous: 1.5 mmol/L (ref 0.5–1.9)

## 2023-05-21 LAB — VALPROIC ACID LEVEL: Valproic Acid Lvl: 29 ug/mL — ABNORMAL LOW (ref 50.0–100.0)

## 2023-05-21 LAB — BRAIN NATRIURETIC PEPTIDE: B Natriuretic Peptide: 184.5 pg/mL — ABNORMAL HIGH (ref 0.0–100.0)

## 2023-05-21 MED ORDER — ACETAMINOPHEN 650 MG RE SUPP: 650.0000 mg | Freq: Four times a day (QID) | RECTAL | Status: DC | PRN

## 2023-05-21 MED ORDER — ALPRAZOLAM 0.5 MG PO TABS: 0.5000 mg | ORAL_TABLET | Freq: Every evening | ORAL | Status: DC | PRN

## 2023-05-21 MED ORDER — ENOXAPARIN SODIUM 40 MG/0.4ML IJ SOSY: 40.0000 mg | PREFILLED_SYRINGE | INTRAMUSCULAR | Status: DC

## 2023-05-21 MED ORDER — ONDANSETRON HCL 4 MG PO TABS: 4.0000 mg | ORAL_TABLET | Freq: Four times a day (QID) | ORAL | Status: DC | PRN

## 2023-05-21 MED ORDER — TOPIRAMATE 25 MG PO TABS
50.0000 mg | ORAL_TABLET | Freq: Every day | ORAL | Status: DC
  Administered 2023-05-21 – 2023-05-27 (×6): 50 mg via ORAL
  Filled 2023-05-21 (×7): qty 2

## 2023-05-21 MED ORDER — SODIUM CHLORIDE 0.9 % IV BOLUS
1000.0000 mL | Freq: Once | INTRAVENOUS | Status: AC
  Administered 2023-05-21: 1000 mL via INTRAVENOUS

## 2023-05-21 MED ORDER — SODIUM CHLORIDE 0.9 % IV SOLN
500.0000 mg | Freq: Once | INTRAVENOUS | Status: AC
  Administered 2023-05-21: 500 mg via INTRAVENOUS
  Filled 2023-05-21: qty 5

## 2023-05-21 MED ORDER — ONDANSETRON HCL 4 MG/2ML IJ SOLN: 4.0000 mg | Freq: Four times a day (QID) | INTRAMUSCULAR | Status: DC | PRN

## 2023-05-21 MED ORDER — DIVALPROEX SODIUM ER 500 MG PO TB24
500.0000 mg | ORAL_TABLET | Freq: Every day | ORAL | Status: DC
  Administered 2023-05-21 – 2023-05-28 (×8): 500 mg via ORAL
  Filled 2023-05-21: qty 1
  Filled 2023-05-21: qty 2
  Filled 2023-05-21: qty 1
  Filled 2023-05-21 (×2): qty 2
  Filled 2023-05-21: qty 1
  Filled 2023-05-21 (×2): qty 2

## 2023-05-21 MED ORDER — PANTOPRAZOLE SODIUM 40 MG PO TBEC
40.0000 mg | DELAYED_RELEASE_TABLET | Freq: Every day | ORAL | Status: DC
  Administered 2023-05-21 – 2023-05-28 (×8): 40 mg via ORAL
  Filled 2023-05-21 (×8): qty 1

## 2023-05-21 MED ORDER — VANCOMYCIN HCL 1.5 G IV SOLR
1500.0000 mg | INTRAVENOUS | Status: DC
  Administered 2023-05-22: 1500 mg via INTRAVENOUS
  Filled 2023-05-21: qty 30

## 2023-05-21 MED ORDER — SODIUM CHLORIDE 0.9 % IV SOLN
1.0000 g | Freq: Once | INTRAVENOUS | Status: AC
  Administered 2023-05-21: 1 g via INTRAVENOUS
  Filled 2023-05-21: qty 10

## 2023-05-21 MED ORDER — APIXABAN 2.5 MG PO TABS
2.5000 mg | ORAL_TABLET | Freq: Two times a day (BID) | ORAL | Status: DC
  Administered 2023-05-21 – 2023-05-28 (×13): 2.5 mg via ORAL
  Filled 2023-05-21 (×14): qty 1

## 2023-05-21 MED ORDER — TAMSULOSIN HCL 0.4 MG PO CAPS
0.4000 mg | ORAL_CAPSULE | Freq: Every day | ORAL | Status: DC
  Administered 2023-05-21 – 2023-05-28 (×8): 0.4 mg via ORAL
  Filled 2023-05-21 (×8): qty 1

## 2023-05-21 MED ORDER — VANCOMYCIN HCL 1.5 G IV SOLR
1500.0000 mg | Freq: Once | INTRAVENOUS | Status: AC
  Administered 2023-05-21: 1500 mg via INTRAVENOUS
  Filled 2023-05-21: qty 30

## 2023-05-21 MED ORDER — SODIUM CHLORIDE 0.9 % IV SOLN
2.0000 g | Freq: Three times a day (TID) | INTRAVENOUS | Status: DC
  Administered 2023-05-21 – 2023-05-23 (×5): 2 g via INTRAVENOUS
  Filled 2023-05-21 (×5): qty 12.5

## 2023-05-21 MED ORDER — ACETAMINOPHEN 325 MG PO TABS: 650.0000 mg | ORAL_TABLET | Freq: Four times a day (QID) | ORAL | Status: DC | PRN

## 2023-05-21 MED ORDER — TRAZODONE HCL 50 MG PO TABS
50.0000 mg | ORAL_TABLET | Freq: Every day | ORAL | Status: DC
  Administered 2023-05-21 – 2023-05-27 (×6): 50 mg via ORAL
  Filled 2023-05-21 (×7): qty 1

## 2023-05-21 MED ORDER — SODIUM CHLORIDE 0.9 % IV BOLUS
500.0000 mL | Freq: Once | INTRAVENOUS | Status: AC
  Administered 2023-05-21: 500 mL via INTRAVENOUS

## 2023-05-21 MED ORDER — OXYCODONE HCL 5 MG PO TABS
5.0000 mg | ORAL_TABLET | ORAL | Status: DC | PRN
  Administered 2023-05-26: 5 mg via ORAL
  Filled 2023-05-21: qty 1

## 2023-05-21 MED ORDER — LACTULOSE 10 GM/15ML PO SOLN
10.0000 g | Freq: Two times a day (BID) | ORAL | Status: DC
  Administered 2023-05-21 – 2023-05-28 (×7): 10 g via ORAL
  Filled 2023-05-21 (×3): qty 15
  Filled 2023-05-21 (×2): qty 30
  Filled 2023-05-21 (×8): qty 15

## 2023-05-21 NOTE — ED Provider Notes (Signed)
 [place] EMERGENCY DEPARTMENT AT [facility] Provider Note   CSN: [id number] Arrival date & time: 05/21/23  1630     History  Chief Complaint  Patient presents with   Hypotension    Kenneth Bowen is a 89 y.o. male here presenting with hypotension.  Patient has history of A-fib on Eliquis , heart block with AICD here presenting with hypotension.  Patient came to the ER several days ago with chest pain and troponin is stable around 35.  Patient is from a an assisted living.  He states that his blood pressure has been running low last several days.  His blood pressure was checked this morning and was 80s.  Patient states that he is urinating very frequently and eating and now vomiting.  He went to cardiology clinic was noted to be hypotensive and sent here for further evaluation.  Denies any fevers or chills.  Denies any abdominal pain.  Denies any further chest pain now.  The history is provided by the patient.       Home Medications Prior to Admission medications   Medication Sig Start Date End Date Taking? Authorizing Provider  ALPRAZolam  (XANAX ) 0.5 MG tablet Take 0.5 mg by mouth at bedtime as needed for sleep. 10/28/20   [provider]  apixaban  (ELIQUIS ) 2.5 MG TABS tablet Take 1 tablet (2.5 mg total) by mouth 2 (two) times daily. Resume from 06/10/22 08/07/22   Odell Celinda Balo, MD  ARTIFICIAL TEAR OP Place 1 drop into both eyes at bedtime as needed (dry eye).    [provider]  cholecalciferol  (VITAMIN D ) 1000 UNITS tablet Take 2,000 Units by mouth daily.     [provider]  desoximetasone (TOPICORT) 0.25 % cream Apply 1 Application topically at bedtime. 06/17/19   [provider]  divalproex  (DEPAKOTE  ER) 500 MG 24 hr tablet TAKE 1 TABLET (500 MG TOTAL) BY MOUTH DAILY. Patient taking differently: Take 500 mg by mouth at bedtime. 03/14/23   Whitfield Raisin, NP  Ferrous Sulfate  (IRON  PO) Take 1 tablet by mouth daily.    [provider]  folic acid  (FOLVITE ) 1 MG tablet Take 1 tablet (1 mg total) by mouth daily. 06/10/22   Cheryle Page, MD  furosemide  (LASIX ) 40 MG tablet Take 40 mg by mouth daily.    [provider]  lactulose  (CHRONULAC ) 10 GM/15ML solution Take 15 mLs (10 g total) by mouth 2 (two) times daily. Patient taking differently: Take 10 g by mouth daily as needed for mild constipation. 06/09/22   Cheryle Page, MD  mometasone (ELOCON) 0.1 % ointment Apply 1 application topically 2 (two) times daily as needed (rash).  06/23/19   [provider]  Multiple Vitamin (MULTIVITAMIN) tablet Take 1 tablet by mouth daily.    [provider]  mupirocin  ointment (BACTROBAN ) 2 % Apply 1 Application topically 2 (two) times daily. 07/09/22   [provider]  omeprazole (PRILOSEC) 20 MG capsule Take 20 mg by mouth daily. 12/29/18   [provider]  tamsulosin  (FLOMAX ) 0.4 MG CAPS capsule TAKE 1 CAPSULE (0.4 MG TOTAL) BY MOUTH DAILY AFTER SUPPER. Patient taking differently: Take 0.4 mg by mouth daily. 03/21/16   Patrcia Cough, MD  topiramate  (TOPAMAX ) 50 MG tablet Take 1 tablet (50 mg total) by mouth at bedtime. 12/27/22   Whitfield Raisin, NP  traMADol  (ULTRAM ) 50 MG tablet Take 1 tablet (50 mg total) by mouth every 6 (six) hours as needed for moderate pain. 08/06/22  Odell Celinda Balo, MD  traZODone  (DESYREL ) 50 MG tablet Take 50 mg by mouth at bedtime. 12/13/20   [provider]  triamcinolone cream (KENALOG) 0.1 % Apply 1 Application topically as directed. 03/14/23   [provider]      Allergies    Tape    Review of Systems   Review of Systems  Neurological:  Positive for dizziness and weakness.  All other systems reviewed and are negative.   Physical Exam Updated Vital Signs BP (!) 85/47 (BP Location: Left Arm)   Pulse 67   Temp 97.7 F (36.5 C) (Axillary)   Resp 16   SpO2 100%  Physical Exam Vitals and nursing note reviewed.   Constitutional:      Comments: Well appearing for age  HENT:     Head: Normocephalic.     Nose: Nose normal.     Mouth/Throat:     Mouth: Mucous membranes are dry.  Eyes:     Extraocular Movements: Extraocular movements intact.     Pupils: Pupils are equal, round, and reactive to light.  Cardiovascular:     Rate and Rhythm: Normal rate and regular rhythm.     Pulses: Normal pulses.     Heart sounds: Normal heart sounds.  Pulmonary:     Effort: Pulmonary effort is normal.     Breath sounds: Normal breath sounds.  Abdominal:     General: Abdomen is flat.     Palpations: Abdomen is soft.  Musculoskeletal:        General: Normal range of motion.     Cervical back: Normal range of motion and neck supple.  Skin:    General: Skin is warm.     Capillary Refill: Capillary refill takes less than 2 seconds.  Neurological:     General: No focal deficit present.     Mental Status: He is alert and oriented to person, place, and time.  Psychiatric:        Mood and Affect: Mood normal.        Behavior: Behavior normal.     ED Results / Procedures / Treatments   Labs (all labs ordered are listed, but only abnormal results are displayed) Labs Reviewed  CULTURE, BLOOD (ROUTINE X 2)  CULTURE, BLOOD (ROUTINE X 2)  URINE CULTURE  BASIC METABOLIC PANEL  CBC WITH DIFFERENTIAL/PLATELET  URINALYSIS, ROUTINE W REFLEX MICROSCOPIC  BRAIN NATRIURETIC PEPTIDE  VALPROIC  ACID LEVEL  I-STAT CG4 LACTIC ACID, ED  TROPONIN I (HIGH SENSITIVITY)    EKG None  Radiology DG Chest Port 1 View Result Date: 05/21/2023 CLINICAL DATA:  Hypotension and weakness. Concern for infection. Burning with urination. EXAM: PORTABLE CHEST 1 VIEW COMPARISON:  05/18/2023 FINDINGS: Cardiac pacemaker. Shallow inspiration with linear atelectasis in the lung bases, similar to prior study. No airspace disease or consolidation. No pleural effusions. No pneumothorax. Mediastinal contours appear intact. IMPRESSION: Shallow  inspiration with linear atelectasis in the lung bases, similar to prior study. Electronically Signed   By: Elsie Gravely M.D.   On: 05/21/2023 17:21    Procedures Procedures    CRITICAL CARE Performed by: Alm VEAR Cave   Total critical care time: 39 minutes  Critical care time was exclusive of separately billable procedures and treating other patients.  Critical care was necessary to treat or prevent imminent or life-threatening deterioration.  Critical care was time spent personally by me on the following activities: development of treatment plan with patient and/or surrogate as well as nursing, discussions with consultants,  evaluation of patient's response to treatment, examination of patient, obtaining history from patient or surrogate, ordering and performing treatments and interventions, ordering and review of laboratory studies, ordering and review of radiographic studies, pulse oximetry and re-evaluation of patient's condition.   Medications Ordered in ED Medications  sodium chloride  0.9 % bolus 500 mL (has no administration in time range)    ED Course/ Medical Decision Making/ A&P                                 Medical Decision Making CALUM CORMIER is a 89 y.o. male here presenting with hypotension.  Considered dehydration versus UTI versus sepsis from pneumonia causing symptoms.  Plan to get CBC and CMP and lactate and cultures and UA and urine culture.  Will hydrate patient and reassess.  7:36 PM I reviewed patient's labs and white blood cell count is normal.  Lactate is normal.  Chest x-ray showed questionable pneumonia.  Patient was given empiric antibiotics for pneumonia.  Patient is no longer hypotensive.  However he is now hypothermic with temperature of 93.  Patient is put on [employer]  Concern for possible sepsis.  At this point hospitalist to admit  Problems Addressed: Hypotension, unspecified hypotension type: acute illness or injury Hypothermia,  initial encounter: acute illness or injury Sepsis, due to unspecified organism, unspecified whether acute organ dysfunction present [facility]): acute illness or injury  Amount and/or Complexity of Data Reviewed Labs: ordered. Decision-making details documented in ED Course. Radiology: ordered and independent interpretation performed. Decision-making details documented in ED Course. ECG/medicine tests: ordered and independent interpretation performed. Decision-making details documented in ED Course.    Final Clinical Impression(s) / ED Diagnoses Final diagnoses:  None    Rx / DC Orders ED Discharge Orders     None         Patt Alm Macho, MD 05/21/23 [phone]

## 2023-05-21 NOTE — Patient Instructions (Addendum)
 Medication Instructions:  No changes *If you need a refill on your cardiac medications before your next appointment, please call your pharmacy*  Lab Work: No labs  Testing/Procedures: No testing  Follow-Up: At [facility], you and your health needs are our priority.  As part of our continuing mission to provide you with exceptional heart care, we have created designated Provider Care Teams.  These Care Teams include your primary Cardiologist (physician) and Advanced Practice Providers (APPs -  Physician Assistants and Nurse Practitioners) who all work together to provide you with the care you need, when you need it.  We recommend signing up for the patient portal called MyChart.  Sign up information is provided on this After Visit Summary.  MyChart is used to connect with patients for Virtual Visits (Telemedicine).  Patients are able to view lab/test results, encounter notes, upcoming appointments, etc.  Non-urgent messages can be sent to your provider as well.   To learn more about what you can do with MyChart, go to [email or web address].    Your next appointment:   As directed by the ER

## 2023-05-21 NOTE — ED Triage Notes (Signed)
 Pt presents with c/o hypotension. Pt reports he was at [facility] for a workup and was told he needed to come here to the ER as his blood pressure was low. [name] BP is 85/47 upon arrival to triage. Pt is asymptomatic at this time.

## 2023-05-21 NOTE — Progress Notes (Signed)
 Covering cardmaster today. Received sign out from [facility] NP that patient being sent to [facility] ED for concerns of hypotension, possible UTI/infection, confusion. Presentation not felt cardiac but cardiology will be available if [facility] team has concerns. I relayed this information to our on call APP/MD.

## 2023-05-21 NOTE — Progress Notes (Signed)
 Pharmacy Antibiotic Note  Kenneth Bowen is a 89 y.o. male admitted on 05/21/2023 with sepsis and possible pneumonia seen on CXR. Pharmacy has been consulted for vancomycin  and cefepime  dosing.  Patient was given ceftriaxone  and azithromycin  x1 while in the ED 1/7 PM. Scr from 1/4 was 0.72 (CrCl ~63.4 ml/min). BMP collected earlier today (1/7) was hemolyzed upon arrival to lab. A repeat BMP was collected and also hemolyzed upon arrival to lab. Will dose antibiotics based off of 1/4 Scr and pharmacy will follow-up BMP tomorrow for any necessary dose adjustments.   Plan: Start cefepime  2g IV q8hrs Give vancomycin  1500mg  IV x1, followed by 1500mg  IV q24hrs (eAUC 491 using Scr rounded to 0.8, IBW, and Vd 0.72) Vanc levels PRN Monitor cultures, renal function, and overall clinical picture  De-escalate abx as able     Temp (24hrs), Avg:95.4 F (35.2 C), Min:93 F (33.9 C), Max:97.7 F (36.5 C)  Recent Labs  Lab 05/18/23 0537 05/21/23 1748 05/21/23 1817 05/21/23 1935  WBC 3.5* 5.3  --   --   CREATININE 0.72  --   --   --   LATICACIDVEN  --   --  1.6 1.5    Estimated Creatinine Clearance: 63.4 mL/min (by C-G formula based on SCr of 0.72 mg/dL).    Allergies  Allergen Reactions   Tape Other (See Comments)    Skin tearing     Antimicrobials this admission: 1/7 ceftriaxone  1g x1 1/7 azithromycin  500mg  x1 1/7 vancomycin  >> 1/7 cefepime  >>  Dose adjustments this admission: N/A  Microbiology results: 1/7 BCx: in process 1/7 UCx: in process    Thank you for allowing pharmacy to be a part of this patient's care.  Lacinda Moats, PharmD Clinical Pharmacist  1/7/20258:42 PM

## 2023-05-21 NOTE — ED Provider Triage Note (Signed)
 Emergency Medicine Provider Triage Evaluation Note  TELVIN REINDERS , a 89 y.o. male  was evaluated in triage.  Pt complains of hypotension and weakness.  Patient was at [facility] and told to come here due to concern for infection.  Patient states he does have burning when he urinates but that they are also worried for pneumonia however is unsure if he said any chest pain shortness of breath or cough.  Patient denies fevers.  Patient states he is eating and drinking okay.  Patient denies medicine changes.  Review of Systems  Positive:  Negative:   Physical Exam  BP (!) 85/47 (BP Location: Left Arm)   Pulse 67   Temp 97.7 F (36.5 C) (Oral)   Resp 16   SpO2 100%  Gen:   Awake, no distress   Resp:  Normal effort  MSK:   Moves extremities without difficulty  Other:  Abd nontender, lungs clear to auscultation bilaterally  Medical Decision Making  Medically screening exam initiated at 4:50 PM.  Appropriate orders placed.  Nancyann LITTIE Hildebrandt was informed that the remainder of the evaluation will be completed by another provider, this initial triage assessment does not replace that evaluation, and the importance of remaining in the ED until their evaluation is complete.  Workup initiated , patient stable this time, triage nurse notified that patient cannot go back out to the waiting room due to blood pressure of 85/47.   Victor Lynwood DASEN, PA-C 05/21/23 1651

## 2023-05-21 NOTE — H&P (Signed)
 History and Physical    Kenneth Bowen FMW:[id number] DOB: [birth year] DOA: 05/21/2023  PCP: Valentin Skates, DO   Chief Complaint: Hypotension  HPI: Kenneth Bowen is a 89 y.o. male with medical history significant of bradycardia status post pacemaker, complete heart block, prostate cancer, prior seizure who presents emergency department due to hypotension.  Patient lives in assisted living facility was noted to have systolic blood pressures in the 80s.  He went to his cardiology clinic who recommended he present to the ER for further assessment.  Patient remained asymptomatic.  On arrival to the emergency department he was hypotensive with systolics in the 80s.  Labs were [place]ane which showed WBC 5.3, hemoglobin 12.3, troponin 40, BNP 184, Depakote  level 29, lactic acid 1.6, urinalysis negative for infection.  Patient underwent chest x-ray which showed no acute findings.  Patient was admitted for further workup.  On admission he was noted to be hypothermic with a temperature 93.  He was started on IV fluids and broad-spectrum antibiotics as well as Lawyer.  CT chest and pelvis was ordered due to undifferentiated sepsis.   Review of Systems: Review of Systems  Constitutional:  Positive for chills and malaise/fatigue. Negative for fever.  HENT: Negative.    Eyes: Negative.   Respiratory: Negative.    Cardiovascular: Negative.   Gastrointestinal: Negative.   Genitourinary: Negative.   Musculoskeletal: Negative.   Skin: Negative.   Neurological: Negative.   Endo/Heme/Allergies: Negative.   Psychiatric/Behavioral: Negative.    All other systems reviewed and are negative.    As per HPI otherwise 10 point review of systems negative.   Allergies  Allergen Reactions   Tape Other (See Comments)    Skin tearing     Past Medical History:  Diagnosis Date   Bradycardia 06/02/2022   Symptomatic bradycardia now status post pacemaker after syncope.   Colon polyps    Diverticulosis     Encephalopathy acute 06/02/2022   Hypothermia 06/02/2022   Pacemaker 05/2022   Due to complete heart block   Prostate cancer [facility]) 2015   radiation finished Nov 2015   Seizures (HCC) 07/2016   Sepsis (HCC) 06/02/2022   Unresponsiveness 07/28/2016    Past Surgical History:  Procedure Laterality Date   colonscopy     INGUINAL HERNIA REPAIR     KNEE SURGERY     PACEMAKER IMPLANT N/A 06/04/2022   Procedure: PACEMAKER IMPLANT;  Surgeon: Waddell Danelle ORN, MD;  Location: [facility] INVASIVE CV LAB;  Service: Cardiovascular;  Laterality: N/A;   PROSTATE BIOPSY     TEMPORARY PACEMAKER N/A 06/03/2022   Procedure: TEMPORARY PACEMAKER;  Surgeon: Cherrie Toribio SAUNDERS, MD;  Location: [facility] INVASIVE CV LAB;  Service: Cardiovascular;  Laterality: N/A;     reports that he has never smoked. He has never used smokeless tobacco. He reports that he does not currently use alcohol after a past usage of about 7.0 standard drinks of alcohol per week. He reports that he does not use drugs.  Family History  Problem Relation Age of Onset   Alcoholism Father    Cancer Neg Hx    Colon cancer Neg Hx     Prior to Admission medications   Medication Sig Start Date End Date Taking? Authorizing Provider  ALPRAZolam  (XANAX ) 0.5 MG tablet Take 0.5 mg by mouth at bedtime as needed for sleep. 10/28/20   [provider]  apixaban  (ELIQUIS ) 2.5 MG TABS tablet Take 1 tablet (2.5 mg total) by mouth 2 (two) times daily. Resume  from 06/10/22 08/07/22   Odell Celinda Balo, MD  ARTIFICIAL TEAR OP Place 1 drop into both eyes at bedtime as needed (dry eye).    [provider]  cholecalciferol  (VITAMIN D ) 1000 UNITS tablet Take 2,000 Units by mouth daily.     [provider]  desoximetasone (TOPICORT) 0.25 % cream Apply 1 Application topically at bedtime. 06/17/19   [provider]  divalproex  (DEPAKOTE  ER) 500 MG 24 hr tablet TAKE 1 TABLET (500 MG TOTAL) BY MOUTH DAILY. Patient taking differently: Take 500  mg by mouth at bedtime. 03/14/23   Whitfield Raisin, NP  Ferrous Sulfate  (IRON  PO) Take 1 tablet by mouth daily.    [provider]  folic acid  (FOLVITE ) 1 MG tablet Take 1 tablet (1 mg total) by mouth daily. 06/10/22   Cheryle Page, MD  furosemide  (LASIX ) 40 MG tablet Take 40 mg by mouth daily.    [provider]  lactulose  (CHRONULAC ) 10 GM/15ML solution Take 15 mLs (10 g total) by mouth 2 (two) times daily. Patient taking differently: Take 10 g by mouth daily as needed for mild constipation. 06/09/22   Cheryle Page, MD  mometasone (ELOCON) 0.1 % ointment Apply 1 application topically 2 (two) times daily as needed (rash).  06/23/19   [provider]  Multiple Vitamin (MULTIVITAMIN) tablet Take 1 tablet by mouth daily.    [provider]  mupirocin  ointment (BACTROBAN ) 2 % Apply 1 Application topically 2 (two) times daily. 07/09/22   [provider]  omeprazole (PRILOSEC) 20 MG capsule Take 20 mg by mouth daily. 12/29/18   [provider]  tamsulosin  (FLOMAX ) 0.4 MG CAPS capsule TAKE 1 CAPSULE (0.4 MG TOTAL) BY MOUTH DAILY AFTER SUPPER. Patient taking differently: Take 0.4 mg by mouth daily. 03/21/16   Patrcia Cough, MD  topiramate  (TOPAMAX ) 50 MG tablet Take 1 tablet (50 mg total) by mouth at bedtime. 12/27/22   Whitfield Raisin, NP  traMADol  (ULTRAM ) 50 MG tablet Take 1 tablet (50 mg total) by mouth every 6 (six) hours as needed for moderate pain. 08/06/22   Odell Celinda Balo, MD  traZODone  (DESYREL ) 50 MG tablet Take 50 mg by mouth at bedtime. 12/13/20   [provider]  triamcinolone cream (KENALOG) 0.1 % Apply 1 Application topically as directed. 03/14/23   [provider]    Physical Exam: Vitals:   05/21/23 1639 05/21/23 1800 05/21/23 1920 05/21/23 1933  BP: (!) 85/47 105/73 120/84   Pulse: 67 (!) 56 73   Resp: 16  18   Temp: 97.7 F (36.5 C)   (S) (!) 93 F (33.9 C)  TempSrc: Axillary   Rectal  SpO2: 100% 96% 98%     Physical Exam Constitutional:      Appearance: He is normal weight.  HENT:     Head: Normocephalic.     Mouth/Throat:     Mouth: Mucous membranes are moist.     Pharynx: Oropharynx is clear.  Eyes:     Conjunctiva/sclera: Conjunctivae normal.     Pupils: Pupils are equal, round, and reactive to light.  Cardiovascular:     Rate and Rhythm: Normal rate and regular rhythm.     Pulses: Normal pulses.     Heart sounds: Normal heart sounds.  Pulmonary:     Effort: Pulmonary effort is normal.     Breath sounds: Normal breath sounds.  Abdominal:     General: Abdomen is flat. Bowel sounds are normal.  Musculoskeletal:  General: Normal range of motion.  Skin:    General: Skin is warm.     Capillary Refill: Capillary refill takes less than 2 seconds.  Neurological:     General: No focal deficit present.     Mental Status: He is alert. Mental status is at baseline.  Psychiatric:        Mood and Affect: Mood normal.        Labs on Admission: I have personally reviewed the patients's labs and imaging studies.  Assessment/Plan Active Problems:   Sepsis (HCC)   # Sepsis, unclear etiology - Patient hypothermic with a temperature of 93 - Patient had hypotension with systolics in the 80s and maps less than 65 - No overt signs of infection  Plan: Continue volume resuscitation Continue vancomycin  and cefepime  Obtain CT chest abdomen pelvis to further assess etiology of possible infection  # Generalized anxiety-continue Xanax   # A-fib, paroxysmal-continue Eliquis   # History of seizure-continue Depakote , Topamax   # BPH-continue Flomax   # Insomnia-continue trazodone   # Chronic anemia-trend cbc    Admission status: Inpatient Stepdown  Certification: The appropriate patient status for this patient is INPATIENT. Inpatient status is judged to be reasonable and necessary in order to provide the required intensity of service to ensure the patient's safety. The  patient's presenting symptoms, physical exam findings, and initial radiographic and laboratory data in the context of their chronic comorbidities is felt to place them at high risk for further clinical deterioration. Furthermore, it is not anticipated that the patient will be medically stable for discharge from the hospital within 2 midnights of admission.   * I certify that at the point of admission it is my clinical judgment that the patient will require inpatient hospital care spanning beyond 2 midnights from the point of admission due to high intensity of service, high risk for further deterioration and high frequency of surveillance required.DEWAINE Lamar Dess MD [facility] If 7PM-7AM, please contact night-coverage [email or web address]  05/21/2023, 8:38 PM

## 2023-05-22 ENCOUNTER — Other Ambulatory Visit: Payer: Self-pay

## 2023-05-22 ENCOUNTER — Encounter (HOSPITAL_COMMUNITY): Payer: Self-pay | Admitting: Internal Medicine

## 2023-05-22 ENCOUNTER — Inpatient Hospital Stay (HOSPITAL_COMMUNITY): Payer: Medicare Other

## 2023-05-22 DIAGNOSIS — A419 Sepsis, unspecified organism: Secondary | ICD-10-CM | POA: Diagnosis not present

## 2023-05-22 LAB — BASIC METABOLIC PANEL
Anion gap: 6 (ref 5–15)
Anion gap: 8 (ref 5–15)
BUN: 20 mg/dL (ref 8–23)
BUN: 23 mg/dL (ref 8–23)
CO2: 22 mmol/L (ref 22–32)
CO2: 22 mmol/L (ref 22–32)
Calcium: 8.3 mg/dL — ABNORMAL LOW (ref 8.9–10.3)
Calcium: 8.7 mg/dL — ABNORMAL LOW (ref 8.9–10.3)
Chloride: 105 mmol/L (ref 98–111)
Chloride: 106 mmol/L (ref 98–111)
Creatinine, Ser: 0.58 mg/dL — ABNORMAL LOW (ref 0.61–1.24)
Creatinine, Ser: 0.66 mg/dL (ref 0.61–1.24)
GFR, Estimated: >60 mL/min (ref >60)
GFR, Estimated: >60 mL/min (ref >60)
Glucose, Bld: 78 mg/dL (ref 70–99)
Glucose, Bld: 99 mg/dL (ref 70–99)
Potassium: 4 mmol/L (ref 3.5–5.1)
Potassium: 4.4 mmol/L (ref 3.5–5.1)
Sodium: 134 mmol/L — ABNORMAL LOW (ref 135–145)
Sodium: 135 mmol/L (ref 135–145)

## 2023-05-22 LAB — URINE CULTURE: Culture: NO GROWTH

## 2023-05-22 LAB — PROTIME-INR
INR: 1.1 (ref 0.8–1.2)
Prothrombin Time: 14.4 s (ref 11.4–15.2)

## 2023-05-22 LAB — TSH: TSH: 3.806 u[IU]/mL (ref 0.350–4.500)

## 2023-05-22 LAB — CBC
HCT: 31.5 % — ABNORMAL LOW (ref 39.0–52.0)
Hemoglobin: 10.6 g/dL — ABNORMAL LOW (ref 13.0–17.0)
MCH: 32.7 pg (ref 26.0–34.0)
MCHC: 33.7 g/dL (ref 30.0–36.0)
MCV: 97.2 fL (ref 80.0–100.0)
Platelets: 208 10*3/uL (ref 150–400)
RBC: 3.24 MIL/uL — ABNORMAL LOW (ref 4.22–5.81)
RDW: 13.6 % (ref 11.5–15.5)
WBC: 4.6 10*3/uL (ref 4.0–10.5)
nRBC: 0 % (ref 0.0–0.2)

## 2023-05-22 LAB — MRSA NEXT GEN BY PCR, NASAL: MRSA by PCR Next Gen: NOT DETECTED

## 2023-05-22 MED ORDER — IOHEXOL 300 MG/ML  SOLN
100.0000 mL | Freq: Once | INTRAMUSCULAR | Status: AC | PRN
  Administered 2023-05-22: 100 mL via INTRAVENOUS

## 2023-05-22 MED ORDER — CHLORHEXIDINE GLUCONATE CLOTH 2 % EX PADS
6.0000 | MEDICATED_PAD | Freq: Every day | CUTANEOUS | Status: DC
  Administered 2023-05-22 – 2023-05-23 (×2): 6 via TOPICAL

## 2023-05-22 MED ORDER — ORAL CARE MOUTH RINSE: 15.0000 mL | OROMUCOSAL | Status: DC | PRN

## 2023-05-22 NOTE — ED Notes (Addendum)
 Oral temperature 97.7. Patient tired of wearing the bair hugger. Removed bair hugger. Will continue to monitor.

## 2023-05-22 NOTE — ED Notes (Signed)
 Placed in regular admission hospital bed. Patient expressed appreciation for the comfort.

## 2023-05-22 NOTE — Progress Notes (Signed)
 PROGRESS NOTE  Kenneth Bowen FMW:[id number] DOB: 01/29/1931 DOA: 05/21/2023 PCP: Valentin Skates, DO   LOS: 1 day   Brief narrative:   Kenneth Bowen is a 89 y.o. male with medical history significant of bradycardia status post pacemaker, complete heart block, prostate cancer, history of seizure disorder presented to hospital from assisted living facility for low blood pressure.  Patient had gone to his cardiology clinic when he was recommended to come to the hospital for low blood pressure.  In the ED he was hypotensive with systolic in the 80s.  Was noted to be hypothermic with temperature of 93 F.  Labs showed WBC at 5.3 with hemoglobin of 12.3. Depakote  level 29, lactic acid 1.6, urinalysis negative for infection.  Chest x-ray which showed no acute findings.  Patient was started on IV fluids and broad-spectrum antibiotics as well as Lawyer.  CT chest, abdomen and pelvis did not show any acute findings.  Patient was then admitted hospital for further evaluation and treatment.  Assessment/Plan  Question sepsis. Patient presented with hypotension, hypothermia on presentation likely from infection. Diabetes fluid bolus with improvement in blood pressure at this time.  Lactate was 1.5.  Empirically on vancomycin  and cefepime .  CT chest abdomen pelvis without any overt signs of infection.  Urinalysis and chest x-ray was negative as well.  TSH was within normal limits at 3.8.  Follow-up blood cultures urine cultures pending.  Blood cultures negative in less than 12 hours.  De-escalate antibiotic by tomorrow if negative cultures  Hypothermia.  Still on [employer]  TSH of 3.8.  Generalized anxiety disorder-continue Xanax    Paroxysmal atrial fibrillation.-continue Eliquis     History of seizure-continue Depakote , Topamax .  Valproic  acid level was 29.   BPH-continue Flomax    Insomnia-continue trazodone    Chronic anemia-latest hemoglobin of 10.6.     DVT prophylaxis: apixaban   (ELIQUIS ) tablet 2.5 mg Start: 05/21/23 2200 SCDs Start: 05/21/23 2038 apixaban  (ELIQUIS ) tablet 2.5 mg   Disposition: Patient from independent living facility.  Likely back to independent living facility in 1 to 2 days.  Will get PT OT evaluation.  Status is: Inpatient Remains inpatient appropriate because: Patient sepsis, IV antibiotic, hypothermia    Code Status:     Code Status: Limited: Do not attempt resuscitation (DNR) -DNR-LIMITED -Do Not Intubate/DNI   Family Communication: None at bedside  Consultants: None  Procedures: None  Anti-infectives:  Vancomycin  and cefepime   Anti-infectives (From admission, onward)    Start     Dose/Rate Route Frequency Ordered Stop   05/22/23 2130  Vancomycin  (VANCOCIN ) 1,500 mg in sodium chloride  0.9 % 500 mL IVPB       Placed in Followed by Linked Group   1,500 mg 250 mL/hr over 120 Minutes Intravenous Every 24 hours 05/21/23 2058     05/21/23 2200  ceFEPIme  (MAXIPIME ) 2 g in sodium chloride  0.9 % 100 mL IVPB        2 g 200 mL/hr over 30 Minutes Intravenous Every 8 hours 05/21/23 2058     05/21/23 2130  Vancomycin  (VANCOCIN ) 1,500 mg in sodium chloride  0.9 % 500 mL IVPB       Placed in Followed by Linked Group   1,500 mg 250 mL/hr over 120 Minutes Intravenous  Once 05/21/23 2058 05/22/23 0010   05/21/23 1945  cefTRIAXone  (ROCEPHIN ) 1 g in sodium chloride  0.9 % 100 mL IVPB        1 g 200 mL/hr over 30 Minutes Intravenous  Once 05/21/23 1930 05/21/23  2007   05/21/23 1945  azithromycin  (ZITHROMAX ) 500 mg in sodium chloride  0.9 % 250 mL IVPB        500 mg 250 mL/hr over 60 Minutes Intravenous  Once 05/21/23 1930 05/21/23 2112        Subjective: Today, patient was seen and examined at bedside.  Patient is alert awake oriented.  Complains of cough but no chest pain dyspnea or shortness of breath.  Communicative.  Objective: Vitals:   05/22/23 0843 05/22/23 0900  BP:  115/65  Pulse:  71  Resp:  12  Temp: (!) 95.4 F (35.2  C)   SpO2:  96%    Intake/Output Summary (Last 24 hours) at 05/22/2023 9057 Last data filed at 05/22/2023 0308 Gross per 24 hour  Intake --  Output 600 ml  Net -600 ml   There were no vitals filed for this visit. There is no height or weight on file to calculate BMI.   Physical Exam: GENERAL: Patient is alert awake and oriented. Not in obvious distress.  On room air, on Bair hugger elderly male HENT: No scleral pallor or icterus. Pupils equally reactive to light. Oral mucosa is moist NECK: is supple, no gross swelling noted. CHEST: Clear to auscultation. No crackles or wheezes.  Diminished breath sounds bilaterally. CVS: S1 and S2 heard, no murmur. Regular rate and rhythm.  ABDOMEN: Soft, non-tender, bowel sounds are present. EXTREMITIES: No edema. CNS: Cranial nerves are intact. No focal motor deficits.  Generalized weakness noted. SKIN: warm and dry without rashes.  Data Review: I have personally reviewed the following laboratory data and studies,  CBC: Recent Labs  Lab 05/18/23 0537 05/21/23 1748 05/22/23 0541  WBC 3.5* 5.3 4.6  NEUTROABS  --  3.8  --   HGB 12.5* 12.3* 10.6*  HCT 36.6* 35.7* 31.5*  MCV 95.8 97.0 97.2  PLT 281 220 208   Basic Metabolic Panel: Recent Labs  Lab 05/18/23 0537 05/21/23 2352 05/22/23 0541  NA 136 134* 135  K 4.8 4.0 4.4  CL 102 106 105  CO2 26 22 22   GLUCOSE 113* 99 78  BUN 15 23 20   CREATININE 0.72 0.58* 0.66  CALCIUM  9.5 8.3* 8.7*   Liver Function Tests: Recent Labs  Lab 05/18/23 0537  AST 37  ALT 25  ALKPHOS 118  BILITOT 1.1  PROT 6.7  ALBUMIN 3.4*   No results for input(s): LIPASE, AMYLASE in the last 168 hours. No results for input(s): AMMONIA in the last 168 hours. Cardiac Enzymes: No results for input(s): CKTOTAL, CKMB, CKMBINDEX, TROPONINI in the last 168 hours. BNP (last 3 results) Recent Labs    03/28/23 0410 05/21/23 1748  BNP 67.8 184.5*    ProBNP (last 3 results) No results for  input(s): PROBNP in the last 8760 hours.  CBG: No results for input(s): GLUCAP in the last 168 hours. Recent Results (from the past 240 hours)  Blood culture (routine x 2)     Status: None (Preliminary result)   Collection Time: 05/21/23  5:48 PM   Specimen: BLOOD  Result Value Ref Range Status   Specimen Description   Final    BLOOD LEFT ANTECUBITAL Performed at [facility], [address], [place], [place] [address]    Special Requests   Final    BOTTLES DRAWN AEROBIC AND ANAEROBIC Blood Culture results may not be optimal due to an inadequate volume of blood received in culture bottles Performed at [facility], [address]  [place], [place] [address]    Culture   Final    NO GROWTH < 12 HOURS Performed at [facility], [address], [place], [place] [address]    Report Status PENDING  Incomplete     Studies: CT CHEST ABDOMEN PELVIS W CONTRAST Result Date: 05/22/2023 CLINICAL DATA:  Sepsis, hypotension. EXAM: CT CHEST, ABDOMEN, AND PELVIS WITH CONTRAST TECHNIQUE: Multidetector CT imaging of the chest, abdomen and pelvis was performed following the standard protocol during bolus administration of intravenous contrast. RADIATION DOSE REDUCTION: This exam was performed according to the departmental dose-optimization program which includes automated exposure control, adjustment of the mA and/or kV according to patient size and/or use of iterative reconstruction technique. CONTRAST:  OMNIPAQUE  IOHEXOL  300 MG/ML  SOLN COMPARISON:  08/02/2022 FINDINGS: CT CHEST FINDINGS Cardiovascular: Heart is normal size. Aorta is normal caliber. Pacer wires in the right heart. Moderate coronary artery and aortic atherosclerosis. Mediastinum/Nodes: No mediastinal, hilar, or axillary adenopathy. Trachea and esophagus are unremarkable. Thyroid unremarkable. Lungs/Pleura: Areas of linear scarring or atelectasis in the lingula and both lower lobes at the  lung bases. No acute confluent areas of consolidation or effusions. Musculoskeletal: Chest wall soft tissues are unremarkable. No acute bony abnormality. Severe chronic compression fracture at T12. CT ABDOMEN PELVIS FINDINGS Hepatobiliary: No focal hepatic abnormality. Gallbladder unremarkable. Pancreas: No focal abnormality or ductal dilatation. Spleen: No focal abnormality.  Normal size. Adrenals/Urinary Tract: No adrenal abnormality. No focal renal abnormality. No stones or hydronephrosis. Urinary bladder is unremarkable. Stomach/Bowel: Moderate stool burden throughout the colon. Normal appendix. Stomach, large and small bowel grossly unremarkable. Vascular/Lymphatic: No evidence of aneurysm or adenopathy. Reproductive: No visible focal abnormality. Other: No free fluid or free air. Musculoskeletal: No acute bony abnormality. Severe chronic multilevel compression fractures involving all lumbar vertebral bodies. IMPRESSION: No acute findings in the chest, abdomen or pelvis. Linear and platelike densities in the lung bases most compatible with scarring or atelectasis. Coronary artery disease, aortic atherosclerosis. Sigmoid diverticulosis. Moderate stool burden throughout the colon. Electronically Signed   By: Franky Crease M.D.   On: 05/22/2023 01:17   DG Chest Port 1 View Result Date: 05/21/2023 CLINICAL DATA:  Hypotension and weakness. Concern for infection. Burning with urination. EXAM: PORTABLE CHEST 1 VIEW COMPARISON:  05/18/2023 FINDINGS: Cardiac pacemaker. Shallow inspiration with linear atelectasis in the lung bases, similar to prior study. No airspace disease or consolidation. No pleural effusions. No pneumothorax. Mediastinal contours appear intact. IMPRESSION: Shallow inspiration with linear atelectasis in the lung bases, similar to prior study. Electronically Signed   By: Elsie Gravely M.D.   On: 05/21/2023 17:21      Vernal Alstrom, MD  [facility] 05/22/2023  If 7PM-7AM, please  contact night-coverage

## 2023-05-22 NOTE — ED Notes (Signed)
 Pt condom cath came off, Writer assisted pt with new one.

## 2023-05-23 DIAGNOSIS — T68XXXA Hypothermia, initial encounter: Secondary | ICD-10-CM | POA: Diagnosis not present

## 2023-05-23 MED ORDER — SODIUM CHLORIDE 0.9 % IV BOLUS
500.0000 mL | Freq: Once | INTRAVENOUS | Status: AC
  Administered 2023-05-23: 500 mL via INTRAVENOUS

## 2023-05-23 MED ORDER — HYDROXYZINE HCL 10 MG PO TABS
10.0000 mg | ORAL_TABLET | Freq: Three times a day (TID) | ORAL | Status: DC | PRN
  Administered 2023-05-23: 10 mg via ORAL
  Filled 2023-05-23: qty 1

## 2023-05-23 MED ORDER — HYDROCERIN EX CREA
TOPICAL_CREAM | Freq: Two times a day (BID) | CUTANEOUS | Status: DC
  Filled 2023-05-23 (×2): qty 113

## 2023-05-23 NOTE — Plan of Care (Signed)

## 2023-05-23 NOTE — Progress Notes (Addendum)
 Orthostatic vitals   @1435  lying in chair: HR 73 BP 117/53 MAP 76  @1440  sitting in chair: HR 70 BP 126/74 MAP 82  @1445  after 3 mins sitting in chair: HR 66  BP 111/60 MAP 72

## 2023-05-23 NOTE — Progress Notes (Signed)
 Pt has frequent periods of apnea while sleeping. Applied 2L [place]

## 2023-05-23 NOTE — Evaluation (Addendum)
 Physical Therapy Evaluation Patient Details Name: Kenneth Bowen MRN: [id number] DOB: 02-Sep-1930 Today's Date: 05/23/2023  History of Present Illness  Pt is 89 year old presented to [facility] from MD office 05/21/23 for Low BP, hyporthermic.SABRA PMH:  chronic T10-L5 compression fractures,  chronic heart block status post pacemaker, Parkison like syndrome, prostate cancer, seizure disorder, right lower extremity DVT on Eliquis , chronic constipation, BPH, chronic lower back pain.  Clinical Impression  Pt admitted with above diagnosis.  Pt currently with functional limitations due to the deficits listed below (see PT Problem List). Pt will benefit from acute skilled PT to increase their independence and safety with mobility to allow discharge.     The patient is pleasant  and participates in mobility.  Patient oriented to [facility] and states that he has been here 4 days and has not gotten up.SABRA Reoriented  patient to time and situation.  Patient required +2 max support to stand at  [facility] and step to recliner.  No family present and patient not able to give information related to  his ability to ambulate.Note that patient was DC'd to SNF after previous hospitalization and  had returned to ILF Patient will benefit from continued inpatient follow up therapy, <3 hours/day unless  24/7 caregivers available to assist.  BP 106/60 after transfer(prior 131/62)      If plan is discharge home, recommend the following: Two people to help with walking and/or transfers;Assist for transportation;A lot of help with bathing/dressing/bathroom   Can travel by private vehicle   No    Equipment Recommendations None recommended by PT  Recommendations for Other Services       Functional Status Assessment Patient has had a recent decline in their functional status and demonstrates the ability to make significant improvements in function in a reasonable and predictable amount of time.     Precautions / Restrictions  Precautions Precautions: Fall Restrictions Weight Bearing Restrictions Per Provider Order: No      Mobility  Bed Mobility Overal bed mobility: Needs Assistance Bed Mobility: Supine to Sit     Supine to sit: Mod assist, HOB elevated, Used rails     General bed mobility comments: assistance for trunk and scoot forward    Transfers Overall transfer level: Needs assistance Equipment used: Rolling walker (2 wheels) Transfers: Sit to/from Stand, Bed to chair/wheelchair/BSC Sit to Stand: +2 physical assistance, +2 safety/equipment, Max assist, From elevated surface   Step pivot transfers: +2 physical assistance, +2 safety/equipment, Max assist       General transfer comment: steady assist for balance , slow to side step LLE  and turn to recliner, cues to reach [place]    Ambulation/Gait                  Stairs            Wheelchair Mobility     Tilt Bed    Modified Rankin (Stroke Patients Only)       Balance Overall balance assessment: Needs assistance Sitting-balance support: Bilateral upper extremity supported, Feet supported Sitting balance-Leahy Scale: Fair     Standing balance support: Bilateral upper extremity supported, No upper extremity supported, During functional activity Standing balance-Leahy Scale: Poor Standing balance comment: reliant on  RW and external support                             Pertinent Vitals/Pain Pain Assessment Pain Assessment: Faces Faces Pain Scale: Hurts a little  bit Pain Location: generalized Pain Descriptors / Indicators: Aching, Discomfort Pain Intervention(s): Monitored during session    Home Living Family/patient expects to be discharged to:: Assisted living Living Arrangements: Spouse/significant other               Home Equipment: Rollator (4 wheels);Other (comment);Shower seat;Grab bars - tub/shower;Grab bars - toilet;Wheelchair - manual Additional Comments: Lives at [facility] ILF with elderly wife who also requires assistance. Has private caregivers that come in 2x/day- from previous encounter 1 month ago, unsure of  caregiver hours since Return from SNF.    Prior Function Prior Level of Function : Needs assist             Mobility Comments: states he does not ambulate, does not elaborate ADLs Comments: unsure     Extremity/Trunk Assessment   Upper Extremity Assessment Upper Extremity Assessment: Generalized weakness    Lower Extremity Assessment Lower Extremity Assessment: Generalized weakness    Cervical / Trunk Assessment Cervical / Trunk Assessment: Kyphotic  Communication   Communication Communication: Hearing impairment  Cognition Arousal: Alert Behavior During Therapy: WFL for tasks assessed/performed Overall Cognitive Status: No family/caregiver present to determine baseline cognitive functioning Area of Impairment: Orientation, Following commands                 Orientation Level: Time, Situation     Following Commands: Follows one step commands consistently       General Comments: States that he has not been up and has been here 4 days.        General Comments      Exercises     Assessment/Plan    PT Assessment Patient needs continued PT services  PT Problem List Decreased strength;Decreased balance;Decreased cognition;Decreased knowledge of precautions;Decreased mobility;Decreased activity tolerance;Decreased safety awareness       PT Treatment Interventions DME instruction;Therapeutic activities;Gait training;Therapeutic exercise;Patient/family education;Functional mobility training;Balance training    PT Goals (Current goals can be found in the Care Plan section)  Acute Rehab PT Goals Patient Stated Goal: states he needs to get up more PT Goal Formulation: Patient unable to participate in goal setting Time For Goal Achievement: 06/06/23 Potential to Achieve Goals: Fair    Frequency Min 1X/week      Co-evaluation               AM-PAC PT 6 Clicks Mobility  Outcome Measure Help needed turning from your back to your side while in a flat bed without using bedrails?: A Lot Help needed moving from lying on your back to sitting on the side of a flat bed without using bedrails?: A Lot Help needed moving to and from a bed to a chair (including a wheelchair)?: A Lot Help needed standing up from a chair using your arms (e.g., wheelchair or bedside chair)?: A Lot Help needed to walk in hospital room?: Total Help needed climbing 3-5 steps with a railing? : Total 6 Click Score: 10    End of Session Equipment Utilized During Treatment: Gait belt Activity Tolerance: Patient limited by fatigue Patient left: in chair;with chair alarm set;with call bell/phone within reach;with nursing/sitter in room Nurse Communication: Mobility status PT Visit Diagnosis: Unsteadiness on feet (R26.81);Muscle weakness (generalized) (M62.81);Difficulty in walking, not elsewhere classified (R26.2)    Time: 8888-8863 PT Time Calculation (min) (ACUTE ONLY): 25 min   Charges:   PT Evaluation $PT Eval Low Complexity: 1 Low PT Treatments $Therapeutic Activity: 8-22 mins PT General Charges $$ ACUTE PT VISIT: 1 Visit  Darice Potters PT Acute Rehabilitation Services Office [phone] Weekend [phone]   Potters Darice Norris 05/23/2023, 1:14 PM

## 2023-05-23 NOTE — Progress Notes (Addendum)
 PROGRESS NOTE  Kenneth Bowen FMW:[id number] DOB: 05/15/1930 DOA: 05/21/2023 PCP: Valentin Skates, DO   LOS: 2 days   Brief narrative:  Kenneth Bowen is a 89 y.o. male with medical history significant of bradycardia status post pacemaker, complete heart block, prostate cancer, history of seizure disorder presented to hospital from assisted living facility for low blood pressure.  Patient had gone to his cardiology clinic when he was recommended to come to the hospital for low blood pressure.  In the ED, he was hypotensive with systolic in the 80s.  Was noted to be hypothermic with temperature of 93 F.  Labs showed WBC at 5.3 with hemoglobin of 12.3. Depakote  level 29, lactic acid 1.6, urinalysis negative for infection.  Chest x-ray which showed no acute findings.  Patient was started on IV fluids and broad-spectrum antibiotics as well as [place].  CT chest, abdomen and pelvis did not show any acute findings.  Patient was then admitted hospital for further evaluation and treatment.  Assessment/Plan  Question sepsis. Patient presented with hypotension, hypothermia on presentation. Received fluid bolus with improvement in blood pressure at this time.  Lactate was 1.5.  CT chest abdomen pelvis without any overt signs of infection.  Urinalysis and chest x-ray was negative for infiltrate.  TSH was within normal limits at 3.8.   Blood cultures negative in 2 days.  Urine culture no growth.  Empirically on vancomycin  and cefepime .  Will discontinue antibiotic today and observe overnight.  MRSA PCR negative.  Hypothermia.  Improved at this time.  Was on Bair hugger 05/22/2023.  TSH within normal range.  Generalized anxiety disorder-continue Xanax    Paroxysmal atrial fibrillation.-continue Eliquis    History of seizure-continue Depakote , Topamax .  Valproic  acid level was 29.   BPH-continue Flomax    Insomnia-continue trazodone    Chronic anemia-latest hemoglobin of 10.6.     DVT prophylaxis:  apixaban  (ELIQUIS ) tablet 2.5 mg Start: 05/21/23 2200 SCDs Start: 05/21/23 2038 apixaban  (ELIQUIS ) tablet 2.5 mg   Disposition: Patient from independent living facility.  Pending PT evaluation.  Likely discharge on 05/24/23.  Status is: Inpatient  Remains inpatient appropriate because: Patient with suspected sepsis, IV antibiotic, hypothermia, will transfer out of the stepdown unit.    Code Status:     Code Status: Limited: Do not attempt resuscitation (DNR) -DNR-LIMITED -Do Not Intubate/DNI   Family Communication: Spoke with the patient's wife on the phone and updated her about the clinical condition of the patient.  Consultants: None  Procedures: None  Anti-infectives:  Vancomycin  and cefepime , will discontinue  Anti-infectives (From admission, onward)    Start     Dose/Rate Route Frequency Ordered Stop   05/22/23 2130  Vancomycin  (VANCOCIN ) 1,500 mg in sodium chloride  0.9 % 500 mL IVPB       Placed in Followed by Linked Group   1,500 mg 250 mL/hr over 120 Minutes Intravenous Every 24 hours 05/21/23 2058     05/21/23 2200  ceFEPIme  (MAXIPIME ) 2 g in sodium chloride  0.9 % 100 mL IVPB        2 g 200 mL/hr over 30 Minutes Intravenous Every 8 hours 05/21/23 2058     05/21/23 2130  Vancomycin  (VANCOCIN ) 1,500 mg in sodium chloride  0.9 % 500 mL IVPB       Placed in Followed by Linked Group   1,500 mg 250 mL/hr over 120 Minutes Intravenous  Once 05/21/23 2058 05/22/23 0010   05/21/23 1945  cefTRIAXone  (ROCEPHIN ) 1 g in sodium chloride  0.9 % 100 mL  IVPB        1 g 200 mL/hr over 30 Minutes Intravenous  Once 05/21/23 1930 05/21/23 2007   05/21/23 1945  azithromycin  (ZITHROMAX ) 500 mg in sodium chloride  0.9 % 250 mL IVPB        500 mg 250 mL/hr over 60 Minutes Intravenous  Once 05/21/23 1930 05/21/23 2112       Subjective: Today, patient was seen and examined at bedside.  Patient is alert awake and communicative.  Slightly somnolent but denies any cough, congestion,  chest pain, fever, chills or rigor.  Denies any urinary symptoms.   Objective: Vitals:   05/23/23 0900 05/23/23 1000  BP:  131/62  Pulse: 71 62  Resp: 14 14  Temp:    SpO2: 99% 96%    Intake/Output Summary (Last 24 hours) at 05/23/2023 1053 Last data filed at 05/23/2023 0650 Gross per 24 hour  Intake 1161.67 ml  Output 1200 ml  Net -38.33 ml   Filed Weights   05/22/23 1541  Weight: 78.8 kg   Body mass index is 24.93 kg/m.   Physical Exam:  GENERAL: Patient is alert awake and oriented. Not in obvious distress.  On room air, HENT: No scleral pallor or icterus. Pupils equally reactive to light. Oral mucosa is moist NECK: is supple, no gross swelling noted. CHEST: Clear to auscultation. No crackles or wheezes.  Diminished breath sounds bilaterally. CVS: S1 and S2 heard, no murmur. Regular rate and rhythm.  ABDOMEN: Soft, non-tender, bowel sounds are present. EXTREMITIES: No edema. CNS: Cranial nerves are intact. No focal motor deficits.  Generalized weakness noted. SKIN: warm and dry without rashes.  Data Review: I have personally reviewed the following laboratory data and studies,  CBC: Recent Labs  Lab 05/18/23 0537 05/21/23 1748 05/22/23 0541  WBC 3.5* 5.3 4.6  NEUTROABS  --  3.8  --   HGB 12.5* 12.3* 10.6*  HCT 36.6* 35.7* 31.5*  MCV 95.8 97.0 97.2  PLT 281 220 208   Basic Metabolic Panel: Recent Labs  Lab 05/18/23 0537 05/21/23 2352 05/22/23 0541  NA 136 134* 135  K 4.8 4.0 4.4  CL 102 106 105  CO2 26 22 22   GLUCOSE 113* 99 78  BUN 15 23 20   CREATININE 0.72 0.58* 0.66  CALCIUM  9.5 8.3* 8.7*   Liver Function Tests: Recent Labs  Lab 05/18/23 0537  AST 37  ALT 25  ALKPHOS 118  BILITOT 1.1  PROT 6.7  ALBUMIN 3.4*   No results for input(s): LIPASE, AMYLASE in the last 168 hours. No results for input(s): AMMONIA in the last 168 hours. Cardiac Enzymes: No results for input(s): CKTOTAL, CKMB, CKMBINDEX, TROPONINI in the last 168  hours. BNP (last 3 results) Recent Labs    03/28/23 0410 05/21/23 1748  BNP 67.8 184.5*    ProBNP (last 3 results) No results for input(s): PROBNP in the last 8760 hours.  CBG: No results for input(s): GLUCAP in the last 168 hours. Recent Results (from the past 240 hours)  Blood culture (routine x 2)     Status: None (Preliminary result)   Collection Time: 05/21/23  5:48 PM   Specimen: BLOOD  Result Value Ref Range Status   Specimen Description   Final    BLOOD LEFT ANTECUBITAL Performed at [facility], [address] [place], [place] [address]    Special Requests   Final    BOTTLES DRAWN AEROBIC AND ANAEROBIC Blood Culture results may not be optimal due to an  inadequate volume of blood received in culture bottles Performed at [facility], [address] [place], [place] [address]    Culture   Final    NO GROWTH 2 DAYS Performed at [facility], [address], [place], [place] [address]    Report Status PENDING  Incomplete  Urine Culture     Status: None   Collection Time: 05/21/23  6:57 PM   Specimen: Urine, Clean Catch  Result Value Ref Range Status   Specimen Description   Final    URINE, CLEAN CATCH Performed at [facility], [address] [place], [place] [address]    Special Requests   Final    NONE Performed at [facility], [address] [place], [place] [address]    Culture   Final    NO GROWTH Performed at [facility], [address] [place], [place] [address]    Report Status 05/22/2023 FINAL  Final  Blood culture (routine x 2)     Status: None (Preliminary result)   Collection Time: 05/21/23 11:52 PM   Specimen: BLOOD  Result Value Ref Range Status   Specimen Description   Final    BLOOD BLOOD RIGHT FOREARM Performed at [facility], [address] [place], [place] [address]    Special Requests   Final    BOTTLES DRAWN AEROBIC  AND ANAEROBIC Blood Culture adequate volume Performed at [facility], [address] [place], [place] [address]    Culture   Final    NO GROWTH 1 DAY Performed at [facility], [address] [place], [place] [address]    Report Status PENDING  Incomplete  MRSA Next Gen by PCR, Nasal     Status: None   Collection Time: 05/22/23  3:38 PM   Specimen: Nasal Mucosa; Nasal Swab  Result Value Ref Range Status   MRSA by PCR Next Gen NOT DETECTED NOT DETECTED Final    Comment: (NOTE) The GeneXpert MRSA Assay (FDA approved for NASAL specimens only), is one component of a comprehensive MRSA colonization surveillance program. It is not intended to diagnose MRSA infection nor to guide or monitor treatment for MRSA infections. Test performance is not FDA approved in patients less than 67 years old. Performed at [facility], [address] [place], [place] [address]      Studies: CT CHEST ABDOMEN PELVIS W CONTRAST Result Date: 05/22/2023 CLINICAL DATA:  Sepsis, hypotension. EXAM: CT CHEST, ABDOMEN, AND PELVIS WITH CONTRAST TECHNIQUE: Multidetector CT imaging of the chest, abdomen and pelvis was performed following the standard protocol during bolus administration of intravenous contrast. RADIATION DOSE REDUCTION: This exam was performed according to the departmental dose-optimization program which includes automated exposure control, adjustment of the mA and/or kV according to patient size and/or use of iterative reconstruction technique. CONTRAST:  OMNIPAQUE  IOHEXOL  300 MG/ML  SOLN COMPARISON:  08/02/2022 FINDINGS: CT CHEST FINDINGS Cardiovascular: Heart is normal size. Aorta is normal caliber. Pacer wires in the right heart. Moderate coronary artery and aortic atherosclerosis. Mediastinum/Nodes: No mediastinal, hilar, or axillary adenopathy. Trachea and esophagus are unremarkable. Thyroid unremarkable. Lungs/Pleura: Areas of linear scarring or  atelectasis in the lingula and both lower lobes at the lung bases. No acute confluent areas of consolidation or effusions. Musculoskeletal: Chest wall soft tissues are unremarkable. No acute bony abnormality. Severe chronic compression fracture at T12. CT ABDOMEN PELVIS FINDINGS Hepatobiliary: No focal hepatic abnormality. Gallbladder  unremarkable. Pancreas: No focal abnormality or ductal dilatation. Spleen: No focal abnormality.  Normal size. Adrenals/Urinary Tract: No adrenal abnormality. No focal renal abnormality. No stones or hydronephrosis. Urinary bladder is unremarkable. Stomach/Bowel: Moderate stool burden throughout the colon. Normal appendix. Stomach, large and small bowel grossly unremarkable. Vascular/Lymphatic: No evidence of aneurysm or adenopathy. Reproductive: No visible focal abnormality. Other: No free fluid or free air. Musculoskeletal: No acute bony abnormality. Severe chronic multilevel compression fractures involving all lumbar vertebral bodies. IMPRESSION: No acute findings in the chest, abdomen or pelvis. Linear and platelike densities in the lung bases most compatible with scarring or atelectasis. Coronary artery disease, aortic atherosclerosis. Sigmoid diverticulosis. Moderate stool burden throughout the colon. Electronically Signed   By: Franky Crease M.D.   On: 05/22/2023 01:17   DG Chest Port 1 View Result Date: 05/21/2023 CLINICAL DATA:  Hypotension and weakness. Concern for infection. Burning with urination. EXAM: PORTABLE CHEST 1 VIEW COMPARISON:  05/18/2023 FINDINGS: Cardiac pacemaker. Shallow inspiration with linear atelectasis in the lung bases, similar to prior study. No airspace disease or consolidation. No pleural effusions. No pneumothorax. Mediastinal contours appear intact. IMPRESSION: Shallow inspiration with linear atelectasis in the lung bases, similar to prior study. Electronically Signed   By: Elsie Gravely M.D.   On: 05/21/2023 17:21      Vernal Alstrom,  MD  [facility] 05/23/2023  If 7PM-7AM, please contact night-coverage

## 2023-05-24 DIAGNOSIS — A419 Sepsis, unspecified organism: Secondary | ICD-10-CM | POA: Diagnosis not present

## 2023-05-24 LAB — CBC
HCT: 32.3 % — ABNORMAL LOW (ref 39.0–52.0)
Hemoglobin: 10.7 g/dL — ABNORMAL LOW (ref 13.0–17.0)
MCH: 32.8 pg (ref 26.0–34.0)
MCHC: 33.1 g/dL (ref 30.0–36.0)
MCV: 99.1 fL (ref 80.0–100.0)
Platelets: 164 10*3/uL (ref 150–400)
RBC: 3.26 MIL/uL — ABNORMAL LOW (ref 4.22–5.81)
RDW: 14.1 % (ref 11.5–15.5)
WBC: 5.1 10*3/uL (ref 4.0–10.5)
nRBC: 0 % (ref 0.0–0.2)

## 2023-05-24 LAB — GLUCOSE, CAPILLARY: Glucose-Capillary: 79 mg/dL (ref 70–99)

## 2023-05-24 LAB — CORTISOL: Cortisol, Plasma: 12 ug/dL

## 2023-05-24 MED ORDER — MIDODRINE HCL 5 MG PO TABS
2.5000 mg | ORAL_TABLET | Freq: Three times a day (TID) | ORAL | Status: DC
Start: 2023-05-24 — End: 2023-05-24

## 2023-05-24 MED ORDER — COSYNTROPIN 0.25 MG IJ SOLR
0.2500 mg | Freq: Once | INTRAMUSCULAR | Status: AC
  Administered 2023-05-25: 0.25 mg via INTRAVENOUS
  Filled 2023-05-24: qty 0.25

## 2023-05-24 MED ORDER — SODIUM CHLORIDE 0.9 % IV BOLUS
500.0000 mL | Freq: Once | INTRAVENOUS | Status: AC
  Administered 2023-05-24: 500 mL via INTRAVENOUS

## 2023-05-24 MED ORDER — SODIUM CHLORIDE 0.9 % IV BOLUS
250.0000 mL | Freq: Once | INTRAVENOUS | Status: AC
  Administered 2023-05-24: 250 mL via INTRAVENOUS

## 2023-05-24 MED ORDER — SODIUM CHLORIDE 0.9 % IV SOLN
INTRAVENOUS | Status: DC
Start: 2023-05-24 — End: 2023-05-25

## 2023-05-24 NOTE — Plan of Care (Signed)

## 2023-05-24 NOTE — Progress Notes (Addendum)
 PROGRESS NOTE  Kenneth Bowen FMW:[id number] DOB: [birth year] DOA: 05/21/2023 PCP: Valentin Skates, DO   LOS: 3 days   Brief narrative:  Kenneth Bowen is a 89 y.o. male with medical history significant of bradycardia status post pacemaker, complete heart block, prostate cancer, history of seizure disorder presented to hospital from assisted living facility for low blood pressure.  Patient had gone to his cardiology clinic when he was recommended to come to the hospital for low blood pressure.  In the ED, he was hypotensive with systolic in the 80s.  Was noted to be hypothermic with temperature of 93 F.  Labs showed WBC at 5.3 with hemoglobin of 12.3. Depakote  level 29, lactic acid 1.6, urinalysis negative for infection.  Chest x-ray which showed no acute findings.  Patient was started on IV fluids and broad-spectrum antibiotics as well as Lawyer.  CT chest, abdomen and pelvis did not show any acute findings.  Patient was then admitted hospital for further evaluation and treatment.  Assessment/Plan  Question sepsis. Patient presented with hypotension, hypothermia on presentation. Received fluid bolus with improvement in blood pressure at this time.  Lactate was 1.5.  CT chest abdomen pelvis without any overt signs of infection.  Urinalysis and chest x-ray was negative for infiltrate.  TSH was within normal limits at 3.8.   Blood cultures negative in 2 days.  Urine culture no growth.  Empirically was put on on vancomycin  and cefepime  but since cultures were negative antibiotics were discontinued 05/23/2023.  Patient persist to have hypotension and hypothermia with some confusion today.  Uncertain etiology at this time.  Serum cortisol a.m. was 12.  Will get formal ACTH  stimulation test in AM.  Question some form of autonomic dysfunction. MRSA PCR negative.  Will continue with IV fluid hydration including bolus for today..  Add compression devices.  Mild confusion.  Will observe.  Could be  hospital induced delirium.  Does not seem to have any infection.  Patient is elderly.  Continue hydration, although blood pressure in body temperature.  Hypothermia.  Needed Bair hugger again on 05/23/2023 due to recurrence of hypothermia.    TSH within normal range.  Temperature today, temperature max of 98.8 F and continue to monitor.  Generalized anxiety disorder-Xanax .  Will Xanax  hold today due to confusion..  On trazodone  at nighttime.   Paroxysmal atrial fibrillation.-continue Eliquis    History of seizure-continue Depakote , Topamax .  Valproic  acid level was 29.   BPH-continue Flomax    Insomnia-continue trazodone    Chronic anemia-latest hemoglobin of 10.7.  Debility, deconditioning.  PT has seen the patient and recommend skilled nursing facility placement.  Consult TOC for skilled nursing facility placement     DVT prophylaxis: apixaban  (ELIQUIS ) tablet 2.5 mg Start: 05/21/23 2200 SCDs Start: 05/21/23 2038 apixaban  (ELIQUIS ) tablet 2.5 mg   Disposition: Patient from independent living facility.  PT has recommended skilled nursing facility placement likely in 1 to 2 days. Status is: Inpatient  Remains inpatient appropriate because: Altered mental status, debility weakness, hypotension, hypothermia, possible need for skilled nursing facility placement.    Code Status:     Code Status: Limited: Do not attempt resuscitation (DNR) -DNR-LIMITED -Do Not Intubate/DNI   Family Communication: Spoke with the patient's wife on the phone on 05/23/2023.  Consultants: None  Procedures: None  Anti-infectives:  None currently  Anti-infectives (From admission, onward)    Start     Dose/Rate Route Frequency Ordered Stop   05/22/23 2130  Vancomycin  (VANCOCIN ) 1,500 mg in sodium chloride   0.9 % 500 mL IVPB  Status:  Discontinued       Placed in Followed by Linked Group   1,500 mg 250 mL/hr over 120 Minutes Intravenous Every 24 hours 05/21/23 2058 05/23/23 1058   05/21/23 2200   ceFEPIme  (MAXIPIME ) 2 g in sodium chloride  0.9 % 100 mL IVPB  Status:  Discontinued        2 g 200 mL/hr over 30 Minutes Intravenous Every 8 hours 05/21/23 2058 05/23/23 1058   05/21/23 2130  Vancomycin  (VANCOCIN ) 1,500 mg in sodium chloride  0.9 % 500 mL IVPB       Placed in Followed by Linked Group   1,500 mg 250 mL/hr over 120 Minutes Intravenous  Once 05/21/23 2058 05/22/23 0010   05/21/23 1945  cefTRIAXone  (ROCEPHIN ) 1 g in sodium chloride  0.9 % 100 mL IVPB        1 g 200 mL/hr over 30 Minutes Intravenous  Once 05/21/23 1930 05/21/23 2007   05/21/23 1945  azithromycin  (ZITHROMAX ) 500 mg in sodium chloride  0.9 % 250 mL IVPB        500 mg 250 mL/hr over 60 Minutes Intravenous  Once 05/21/23 1930 05/21/23 2112       Subjective: Today, patient was seen and examined at bedside.  Seen episode little more confused today, was hypotensive. Required Bair hugger again yesterday.  Is been more somnolent.  Nursing staff reported that he had a large bowel movement last night and urine output seems to be okay.  Objective: Vitals:   05/24/23 1000 05/24/23 1100  BP: (!) 100/36 (!) 91/53  Pulse: 79 76  Resp: 16 14  Temp: 97.7 F (36.5 C) (!) 97.5 F (36.4 C)  SpO2: 96% 94%    Intake/Output Summary (Last 24 hours) at 05/24/2023 1253 Last data filed at 05/24/2023 0600 Gross per 24 hour  Intake 500 ml  Output 800 ml  Net -300 ml   Filed Weights   05/22/23 1541  Weight: 78.8 kg   Body mass index is 24.93 kg/m.   Physical Exam:  GENERAL: Patient is alert awake confused, mildly sleepy, not in obvious distress.  On room air, elderly male, HENT: No scleral pallor or icterus. Pupils equally reactive to light. Oral mucosa is moist NECK: is supple, no gross swelling noted. CHEST: Clear to auscultation. No crackles or wheezes.  Diminished breath sounds bilaterally. CVS: S1 and S2 heard, no murmur. Regular rate and rhythm.  ABDOMEN: Soft, non-tender, bowel sounds are present. EXTREMITIES:  No edema. CNS: Cranial nerves are intact.  Somnolent, confused, SKIN: warm and dry without rashes.  Data Review: I have personally reviewed the following laboratory data and studies,  CBC: Recent Labs  Lab 05/18/23 0537 05/21/23 1748 05/22/23 0541 05/24/23 0324  WBC 3.5* 5.3 4.6 5.1  NEUTROABS  --  3.8  --   --   HGB 12.5* 12.3* 10.6* 10.7*  HCT 36.6* 35.7* 31.5* 32.3*  MCV 95.8 97.0 97.2 99.1  PLT 281 220 208 164   Basic Metabolic Panel: Recent Labs  Lab 05/18/23 0537 05/21/23 2352 05/22/23 0541  NA 136 134* 135  K 4.8 4.0 4.4  CL 102 106 105  CO2 26 22 22   GLUCOSE 113* 99 78  BUN 15 23 20   CREATININE 0.72 0.58* 0.66  CALCIUM  9.5 8.3* 8.7*   Liver Function Tests: Recent Labs  Lab 05/18/23 0537  AST 37  ALT 25  ALKPHOS 118  BILITOT 1.1  PROT 6.7  ALBUMIN 3.4*   No results  for input(s): LIPASE, AMYLASE in the last 168 hours. No results for input(s): AMMONIA in the last 168 hours. Cardiac Enzymes: No results for input(s): CKTOTAL, CKMB, CKMBINDEX, TROPONINI in the last 168 hours. BNP (last 3 results) Recent Labs    03/28/23 0410 05/21/23 1748  BNP 67.8 184.5*    ProBNP (last 3 results) No results for input(s): PROBNP in the last 8760 hours.  CBG: No results for input(s): GLUCAP in the last 168 hours. Recent Results (from the past 240 hours)  Blood culture (routine x 2)     Status: None (Preliminary result)   Collection Time: 05/21/23  5:48 PM   Specimen: BLOOD  Result Value Ref Range Status   Specimen Description   Final    BLOOD LEFT ANTECUBITAL Performed at [facility], [address], [place], [place] [address]    Special Requests   Final    BOTTLES DRAWN AEROBIC AND ANAEROBIC Blood Culture results may not be optimal due to an inadequate volume of blood received in culture bottles Performed at [facility], [address] [place], [place] [address]    Culture   Final    NO GROWTH 3  DAYS Performed at [facility], [address] [place], [place] [address]    Report Status PENDING  Incomplete  Urine Culture     Status: None   Collection Time: 05/21/23  6:57 PM   Specimen: Urine, Clean Catch  Result Value Ref Range Status   Specimen Description   Final    URINE, CLEAN CATCH Performed at [facility], [address] [place], [place] [address]    Special Requests   Final    NONE Performed at [facility], [address] [place], [place] [address]    Culture   Final    NO GROWTH Performed at [facility], [address] [place], [place] [address]    Report Status 05/22/2023 FINAL  Final  Blood culture (routine x 2)     Status: None (Preliminary result)   Collection Time: 05/21/23 11:52 PM   Specimen: BLOOD  Result Value Ref Range Status   Specimen Description   Final    BLOOD BLOOD RIGHT FOREARM Performed at [facility], [address] [place], [place] [address]    Special Requests   Final    BOTTLES DRAWN AEROBIC AND ANAEROBIC Blood Culture adequate volume Performed at [facility], [address] [place], [place] [address]    Culture   Final    NO GROWTH 2 DAYS Performed at [facility], [address], [place], [place] [address]    Report Status PENDING  Incomplete  MRSA Next Gen by PCR, Nasal     Status: None   Collection Time: 05/22/23  3:38 PM   Specimen: Nasal Mucosa; Nasal Swab  Result Value Ref Range Status   MRSA by PCR Next Gen NOT DETECTED NOT DETECTED Final    Comment: (NOTE) The GeneXpert MRSA Assay (FDA approved for NASAL specimens only), is one component of a comprehensive MRSA colonization surveillance program. It is not intended to diagnose MRSA infection nor to guide or monitor treatment for MRSA infections. Test performance is not FDA approved in patients less than 31 years old. Performed at [facility], [address] [place], [place] [address]      Studies: No results found.     Jennifer Holland, MD  [facility] 05/24/2023  If 7PM-7AM, please contact night-coverage

## 2023-05-25 DIAGNOSIS — A419 Sepsis, unspecified organism: Secondary | ICD-10-CM | POA: Diagnosis not present

## 2023-05-25 LAB — PHOSPHORUS: Phosphorus: 2.7 mg/dL (ref 2.5–4.6)

## 2023-05-25 LAB — BASIC METABOLIC PANEL
Anion gap: 7 (ref 5–15)
BUN: 27 mg/dL — ABNORMAL HIGH (ref 8–23)
CO2: 23 mmol/L (ref 22–32)
Calcium: 8.6 mg/dL — ABNORMAL LOW (ref 8.9–10.3)
Chloride: 108 mmol/L (ref 98–111)
Creatinine, Ser: 0.9 mg/dL (ref 0.61–1.24)
GFR, Estimated: >60 mL/min (ref >60)
Glucose, Bld: 127 mg/dL — ABNORMAL HIGH (ref 70–99)
Potassium: 3.9 mmol/L (ref 3.5–5.1)
Sodium: 138 mmol/L (ref 135–145)

## 2023-05-25 LAB — CBC
HCT: 31.6 % — ABNORMAL LOW (ref 39.0–52.0)
Hemoglobin: 10.3 g/dL — ABNORMAL LOW (ref 13.0–17.0)
MCH: 32.6 pg (ref 26.0–34.0)
MCHC: 32.6 g/dL (ref 30.0–36.0)
MCV: 100 fL (ref 80.0–100.0)
Platelets: 154 10*3/uL (ref 150–400)
RBC: 3.16 MIL/uL — ABNORMAL LOW (ref 4.22–5.81)
RDW: 13.9 % (ref 11.5–15.5)
WBC: 4.2 10*3/uL (ref 4.0–10.5)
nRBC: 0 % (ref 0.0–0.2)

## 2023-05-25 LAB — ACTH STIMULATION, 3 TIME POINTS
Cortisol, 30 Min: 21.4 ug/dL
Cortisol, 60 Min: 28.9 ug/dL
Cortisol, Base: 8.9 ug/dL

## 2023-05-25 LAB — MAGNESIUM: Magnesium: 2.1 mg/dL (ref 1.7–2.4)

## 2023-05-25 LAB — AMMONIA: Ammonia: 28 umol/L (ref 9–35)

## 2023-05-25 MED ORDER — SODIUM CHLORIDE 0.9 % IV SOLN: INTRAVENOUS | Status: DC

## 2023-05-25 NOTE — Progress Notes (Signed)
 PROGRESS NOTE  Kenneth Bowen FMW:[id number] DOB: 05/18/1930 DOA: 05/21/2023 PCP: Valentin Skates, DO   LOS: 4 days   Brief narrative:  Kenneth Bowen is a 89 y.o. male with medical history significant of bradycardia status post pacemaker, complete heart block, prostate cancer, history of seizure disorder presented to hospital from assisted living facility for low blood pressure.  Patient had gone to his cardiology clinic when he was recommended to come to the hospital for low blood pressure.  In the ED, he was hypotensive with systolic in the 80s.  Was noted to be hypothermic with temperature of 93 F.  Labs showed WBC at 5.3 with hemoglobin of 12.3. Depakote  level 29, lactic acid 1.6, urinalysis negative for infection.  Chest x-ray which showed no acute findings.  Patient was started on IV fluids and broad-spectrum antibiotics as well as Lawyer.  CT chest, abdomen and pelvis did not show any acute findings.  Patient was then admitted hospital for further evaluation and treatment.  Assessment/Plan  Question sepsis. Patient presented with hypotension, hypothermia on presentation. Received fluid bolus with improvement in blood pressure at this time.  Lactate was 1.5.  CT chest abdomen pelvis without any overt signs of infection.  Urinalysis and chest x-ray was negative for infiltrate.  TSH was within normal limits at 3.8.   Blood cultures negative in 3 days.  Urine culture no growth.  Empirically patient was put on on vancomycin  and cefepime  but since cultures were negative antibiotics were discontinued 05/23/2023.  Patient continued to have hypotension and hypothermia with some confusion, Serum cortisol a.m. was 12.  pending ACTH  stimulation test.  Mentation and blood pressure including temperature has improved today.  Question some form of autonomic dysfunction. MRSA PCR negative.  Will continue with IV fluid hydration since patient still appears to be volume depleted.  Compression  devices.  Mild confusion.  Improved.  Could be from hypothermia volume depletion.  Does not look like infection.  Improved body temperature at this time.    Hypothermia.  Needed Bair hugger again on 05/23/2023 due to recurrence of hypothermia.    TSH within normal range.  Patient has improved at this time.  Generalized anxiety disorder-on Xanax  at home.  Currently on hold due to confusion yesterday.  Continue trazodone  at nighttime.   Paroxysmal atrial fibrillation.-continue Eliquis    History of seizure-continue Depakote , Topamax .  Valproic  acid level was 29.   BPH-continue Flomax    Insomnia-continue trazodone    Chronic anemia-latest hemoglobin of 10.7.  Debility, deconditioning.  PT has seen the patient and recommend skilled nursing facility placement.  Consult TOC for skilled nursing facility placement     DVT prophylaxis: Place and maintain sequential compression device Start: 05/24/23 1257 apixaban  (ELIQUIS ) tablet 2.5 mg Start: 05/21/23 2200 SCDs Start: 05/21/23 2038 apixaban  (ELIQUIS ) tablet 2.5 mg   Disposition: Patient from independent living facility.  PT has recommended skilled nursing facility placement likely in 1 to 2 days. Status is: Inpatient  Remains inpatient appropriate because: Altered mental status, debility weakness, hypotension, hypothermia,  need for skilled nursing facility placement.    Code Status:     Code Status: Limited: Do not attempt resuscitation (DNR) -DNR-LIMITED -Do Not Intubate/DNI   Family Communication: Spoke with the patient's wife on the phone on 05/23/2023.  Consultants: None  Procedures: None  Anti-infectives:  None currently  Anti-infectives (From admission, onward)    Start     Dose/Rate Route Frequency Ordered Stop   05/22/23 2130  Vancomycin  (VANCOCIN ) 1,500 mg  in sodium chloride  0.9 % 500 mL IVPB  Status:  Discontinued       Placed in Followed by Linked Group   1,500 mg 250 mL/hr over 120 Minutes Intravenous Every 24  hours 05/21/23 2058 05/23/23 1058   05/21/23 2200  ceFEPIme  (MAXIPIME ) 2 g in sodium chloride  0.9 % 100 mL IVPB  Status:  Discontinued        2 g 200 mL/hr over 30 Minutes Intravenous Every 8 hours 05/21/23 2058 05/23/23 1058   05/21/23 2130  Vancomycin  (VANCOCIN ) 1,500 mg in sodium chloride  0.9 % 500 mL IVPB       Placed in Followed by Linked Group   1,500 mg 250 mL/hr over 120 Minutes Intravenous  Once 05/21/23 2058 05/22/23 0010   05/21/23 1945  cefTRIAXone  (ROCEPHIN ) 1 g in sodium chloride  0.9 % 100 mL IVPB        1 g 200 mL/hr over 30 Minutes Intravenous  Once 05/21/23 1930 05/21/23 2007   05/21/23 1945  azithromycin  (ZITHROMAX ) 500 mg in sodium chloride  0.9 % 250 mL IVPB        500 mg 250 mL/hr over 60 Minutes Intravenous  Once 05/21/23 1930 05/21/23 2112       Subjective: Today, patient was seen and examined at bedside.  Appears to be more alert awake and communicative today.  Denies any pain, nausea, vomiting, fever, chills or rigor.  Blood pressure has improved. Objective: Vitals:   05/25/23 0900 05/25/23 1000  BP: (!) 100/59 (!) 101/48  Pulse:    Resp: 14 (!) 24  Temp: 98.1 F (36.7 C) 97.9 F (36.6 C)  SpO2:      Intake/Output Summary (Last 24 hours) at 05/25/2023 1104 Last data filed at 05/25/2023 0328 Gross per 24 hour  Intake 1471.03 ml  Output 600 ml  Net 871.03 ml   Filed Weights   05/22/23 1541  Weight: 78.8 kg   Body mass index is 24.93 kg/m.   Physical Exam:  GENERAL: Patient is alert awake Communicative,  not in obvious distress.  On room air, elderly male, HENT: No scleral pallor or icterus. Pupils equally reactive to light. Oral mucosa is moist NECK: is supple, no gross swelling noted. CHEST: Clear to auscultation. No crackles or wheezes.   CVS: S1 and S2 heard, no murmur. Regular rate and rhythm.  ABDOMEN: Soft, non-tender, bowel sounds are present. EXTREMITIES: No edema. CNS: Cranial nerves are intact.  Communicative.  Oriented to time  place and person. SKIN: warm and dry without rashes.  Data Review: I have personally reviewed the following laboratory data and studies,  CBC: Recent Labs  Lab 05/21/23 1748 05/22/23 0541 05/24/23 0324 05/25/23 0939  WBC 5.3 4.6 5.1 4.2  NEUTROABS 3.8  --   --   --   HGB 12.3* 10.6* 10.7* 10.3*  HCT 35.7* 31.5* 32.3* 31.6*  MCV 97.0 97.2 99.1 100.0  PLT 220 208 164 154   Basic Metabolic Panel: Recent Labs  Lab 05/21/23 2352 05/22/23 0541 05/25/23 0939  NA 134* 135 138  K 4.0 4.4 3.9  CL 106 105 108  CO2 22 22 23   GLUCOSE 99 78 127*  BUN 23 20 27*  CREATININE 0.58* 0.66 0.90  CALCIUM  8.3* 8.7* 8.6*  MG  --   --  2.1  PHOS  --   --  2.7   Liver Function Tests: No results for input(s): AST, ALT, ALKPHOS, BILITOT, PROT, ALBUMIN in the last 168 hours.  No results for input(s):  LIPASE, AMYLASE in the last 168 hours. Recent Labs  Lab 05/25/23 1014  AMMONIA 28   Cardiac Enzymes: No results for input(s): CKTOTAL, CKMB, CKMBINDEX, TROPONINI in the last 168 hours. BNP (last 3 results) Recent Labs    03/28/23 0410 05/21/23 1748  BNP 67.8 184.5*    ProBNP (last 3 results) No results for input(s): PROBNP in the last 8760 hours.  CBG: Recent Labs  Lab 05/24/23 2249  GLUCAP 79   Recent Results (from the past 240 hours)  Blood culture (routine x 2)     Status: None (Preliminary result)   Collection Time: 05/21/23  5:48 PM   Specimen: BLOOD  Result Value Ref Range Status   Specimen Description   Final    BLOOD LEFT ANTECUBITAL Performed at [facility], [address], [place], [place] [address]    Special Requests   Final    BOTTLES DRAWN AEROBIC AND ANAEROBIC Blood Culture results may not be optimal due to an inadequate volume of blood received in culture bottles Performed at [facility], [address] [place], [place] [address]    Culture   Final    NO GROWTH 4 DAYS Performed at [facility], [address] [place], [place] [address]    Report Status PENDING  Incomplete  Urine Culture     Status: None   Collection Time: 05/21/23  6:57 PM   Specimen: Urine, Clean Catch  Result Value Ref Range Status   Specimen Description   Final    URINE, CLEAN CATCH Performed at [facility], [address] [place], [place] [address]    Special Requests   Final    NONE Performed at [facility], [address] [place], [place] [address]    Culture   Final    NO GROWTH Performed at [facility], [address], [place], [place] [address]    Report Status 05/22/2023 FINAL  Final  Blood culture (routine x 2)     Status: None (Preliminary result)   Collection Time: 05/21/23 11:52 PM   Specimen: BLOOD  Result Value Ref Range Status   Specimen Description   Final    BLOOD BLOOD RIGHT FOREARM Performed at [facility], [address] [place], [place] [address]    Special Requests   Final    BOTTLES DRAWN AEROBIC AND ANAEROBIC Blood Culture adequate volume Performed at [facility], [address], [place], [place] [address]    Culture   Final    NO GROWTH 3 DAYS Performed at [facility], [address] [place], [place] [address]    Report Status PENDING  Incomplete  MRSA Next Gen by PCR, Nasal     Status: None   Collection Time: 05/22/23  3:38 PM   Specimen: Nasal Mucosa; Nasal Swab  Result Value Ref Range Status   MRSA by PCR Next Gen NOT DETECTED NOT DETECTED Final    Comment: (NOTE) The GeneXpert MRSA Assay (FDA approved for NASAL specimens only), is one component of a comprehensive MRSA colonization surveillance program. It is not intended to diagnose MRSA infection nor to guide or monitor treatment for MRSA infections. Test performance is not FDA approved in patients less than 35 years old. Performed at [facility], [address] [place], [place] [address]      Studies: No results found.     Delight Bickle, MD  [facility] 05/25/2023  If 7PM-7AM, please contact night-coverage

## 2023-05-25 NOTE — Plan of Care (Signed)
   Problem: Clinical Measurements: Goal: Ability to maintain clinical measurements within normal limits will improve Outcome: Progressing Goal: Diagnostic test results will improve Outcome: Progressing Goal: Respiratory complications will improve Outcome: Progressing Goal: Cardiovascular complication will be avoided Outcome: Progressing

## 2023-05-26 DIAGNOSIS — T68XXXA Hypothermia, initial encounter: Secondary | ICD-10-CM | POA: Diagnosis not present

## 2023-05-26 LAB — BASIC METABOLIC PANEL
Anion gap: 3 — ABNORMAL LOW (ref 5–15)
BUN: 24 mg/dL — ABNORMAL HIGH (ref 8–23)
CO2: 26 mmol/L (ref 22–32)
Calcium: 8.7 mg/dL — ABNORMAL LOW (ref 8.9–10.3)
Chloride: 109 mmol/L (ref 98–111)
Creatinine, Ser: 0.8 mg/dL (ref 0.61–1.24)
GFR, Estimated: >60 mL/min (ref >60)
Glucose, Bld: 104 mg/dL — ABNORMAL HIGH (ref 70–99)
Potassium: 4 mmol/L (ref 3.5–5.1)
Sodium: 138 mmol/L (ref 135–145)

## 2023-05-26 LAB — CULTURE, BLOOD (ROUTINE X 2): Culture: NO GROWTH

## 2023-05-26 LAB — CBC
HCT: 30.9 % — ABNORMAL LOW (ref 39.0–52.0)
Hemoglobin: 10.1 g/dL — ABNORMAL LOW (ref 13.0–17.0)
MCH: 33 pg (ref 26.0–34.0)
MCHC: 32.7 g/dL (ref 30.0–36.0)
MCV: 101 fL — ABNORMAL HIGH (ref 80.0–100.0)
Platelets: 142 10*3/uL — ABNORMAL LOW (ref 150–400)
RBC: 3.06 MIL/uL — ABNORMAL LOW (ref 4.22–5.81)
RDW: 14 % (ref 11.5–15.5)
WBC: 4.4 10*3/uL (ref 4.0–10.5)
nRBC: 0 % (ref 0.0–0.2)

## 2023-05-26 LAB — MAGNESIUM: Magnesium: 2 mg/dL (ref 1.7–2.4)

## 2023-05-26 MED ORDER — ENSURE ENLIVE PO LIQD
237.0000 mL | Freq: Two times a day (BID) | ORAL | Status: DC
  Administered 2023-05-26 – 2023-05-28 (×4): 237 mL via ORAL

## 2023-05-26 MED ORDER — SODIUM CHLORIDE 0.9 % IV SOLN: INTRAVENOUS | Status: AC

## 2023-05-26 MED ORDER — MIDODRINE HCL 5 MG PO TABS
2.5000 mg | ORAL_TABLET | Freq: Two times a day (BID) | ORAL | Status: DC
  Administered 2023-05-26 – 2023-05-27 (×3): 2.5 mg via ORAL
  Filled 2023-05-26 (×4): qty 1

## 2023-05-26 NOTE — Plan of Care (Signed)

## 2023-05-26 NOTE — Progress Notes (Addendum)
 PROGRESS NOTE  AVANISH CERULLO FMW:[id number] DOB: 1931/01/05 DOA: 05/21/2023 PCP: Valentin Skates, DO   LOS: 5 days   Brief narrative:  Kenneth Bowen is a 89 y.o. male with medical history significant of bradycardia status post pacemaker, complete heart block, prostate cancer, history of seizure disorder presented to hospital from assisted living facility for low blood pressure.  Patient had gone to his cardiology clinic when he was recommended to come to the hospital for low blood pressure.  In the ED, he was hypotensive with systolic in the 80s.  Was noted to be hypothermic with temperature of 93 F.  Labs showed WBC at 5.3 with hemoglobin of 12.3. Depakote  level 29, lactic acid 1.6, urinalysis negative for infection.  Chest x-ray which showed no acute findings.  Patient was started on IV fluids and broad-spectrum antibiotics as well as Lawyer.  CT chest, abdomen and pelvis did not show any acute findings.  Patient was then admitted hospital for further evaluation and treatment.  Assessment/Plan  Hypotension, hypothermia. Patient presented with hypotension, hypothermia on presentation. Received fluid bolus via IV fluid during hospitalization.  Blood pressure is overall improved but still borderline low.  Lactate was 1.5.  CT chest abdomen pelvis without any overt signs of infection.  Urinalysis and chest x-ray was negative for infiltrate.  TSH was within normal limits at 3.8.   Blood cultures negative in 4 days.  Urine culture no growth.  Empirically patient was initially put on on vancomycin  and cefepime  but since cultures were negative antibiotics were discontinued 05/23/2023.  ACTH  stimulation test.  With adequate response.  Mentation and blood pressure including temperature has improved today but still with low borderline blood pressure.  Will start on midodrine  2.5 mg twice daily for now.  Question some form of autonomic dysfunction. MRSA PCR negative.  Hold off with further hydration  today.  Continue compression devices.  Mild confusion.  Improved.  Could be from hypothermia, volume depletion.  Appears to be oriented at this time.  Does not look like infection. .    Hypothermia.  Needed Bair hugger again, patient states that his body temperature is usually low.  Latest temperature of 96.8.  Currently off [employer]  Generalized anxiety disorder-on Xanax  at home.  Currently on hold due to confusion   Continue trazodone  at nighttime.   Paroxysmal atrial fibrillation.-continue Eliquis    History of seizure-continue Depakote , Topamax .  Valproic  acid level was 29.   BPH-continue Flomax    Insomnia-continue trazodone    Chronic anemia-latest hemoglobin of 10.1 and has remained stable..  Debility, deconditioning.  PT has seen the patient and recommend skilled nursing facility placement.  TOC has been consulted.  Will see if PT still recommends skilled nursing facility.   DVT prophylaxis: Place and maintain sequential compression device Start: 05/24/23 1257 apixaban  (ELIQUIS ) tablet 2.5 mg Start: 05/21/23 2200 SCDs Start: 05/21/23 2038 apixaban  (ELIQUIS ) tablet 2.5 mg   Disposition:   Patient from independent living facility.  PT has recommended skilled nursing facility placement, patient wishes to be in the same facility since his wife also lives there.    Status is: Inpatient  Remains inpatient appropriate because:  debility weakness, hypotension, hypothermia,  need for skilled nursing facility placement.    Code Status:     Code Status: Limited: Do not attempt resuscitation (DNR) -DNR-LIMITED -Do Not Intubate/DNI   Family Communication:  Spoke with the patient's wife on the phone on 05/26/2023 and updated her about the clinical condition of the patient.  She is okay about him going to skilled nursing facility but patient is not quite happy about it.  Consultants: None  Procedures: None  Anti-infectives:  None currently  Anti-infectives (From  admission, onward)    Start     Dose/Rate Route Frequency Ordered Stop   05/22/23 2130  Vancomycin  (VANCOCIN ) 1,500 mg in sodium chloride  0.9 % 500 mL IVPB  Status:  Discontinued       Placed in Followed by Linked Group   1,500 mg 250 mL/hr over 120 Minutes Intravenous Every 24 hours 05/21/23 2058 05/23/23 1058   05/21/23 2200  ceFEPIme  (MAXIPIME ) 2 g in sodium chloride  0.9 % 100 mL IVPB  Status:  Discontinued        2 g 200 mL/hr over 30 Minutes Intravenous Every 8 hours 05/21/23 2058 05/23/23 1058   05/21/23 2130  Vancomycin  (VANCOCIN ) 1,500 mg in sodium chloride  0.9 % 500 mL IVPB       Placed in Followed by Linked Group   1,500 mg 250 mL/hr over 120 Minutes Intravenous  Once 05/21/23 2058 05/22/23 0010   05/21/23 1945  cefTRIAXone  (ROCEPHIN ) 1 g in sodium chloride  0.9 % 100 mL IVPB        1 g 200 mL/hr over 30 Minutes Intravenous  Once 05/21/23 1930 05/21/23 2007   05/21/23 1945  azithromycin  (ZITHROMAX ) 500 mg in sodium chloride  0.9 % 250 mL IVPB        500 mg 250 mL/hr over 60 Minutes Intravenous  Once 05/21/23 1930 05/21/23 2112       Subjective:  Today, patient was seen and examined at bedside.  Alert awake and communicative.  Denies any dizziness, lightheadedness shortness of breath but has generalized weakness.  He wishes to go to the same facility where he can be with his wife.  Discussed about the need for rehabilitation.    Objective: Vitals:   05/26/23 0600 05/26/23 0700  BP: (!) 121/98 (!) 141/62  Pulse: 60 61  Resp: 12 20  Temp: (!) 97 F (36.1 C) (!) 96.8 F (36 C)  SpO2: 97% 95%    Intake/Output Summary (Last 24 hours) at 05/26/2023 1101 Last data filed at 05/26/2023 1000 Gross per 24 hour  Intake 1110.88 ml  Output 550 ml  Net 560.88 ml   Filed Weights   05/22/23 1541  Weight: 78.8 kg   Body mass index is 24.93 kg/m.   Physical Exam:  GENERAL: Patient is alert awake Communicative,  not in obvious distress.  On room air, elderly male, HENT:  No scleral pallor or icterus. Pupils equally reactive to light. Oral mucosa is moist NECK: is supple, no gross swelling noted. CHEST: Clear to auscultation. No crackles or wheezes.   CVS: S1 and S2 heard, no murmur. Regular rate and rhythm.  ABDOMEN: Soft, non-tender, bowel sounds are present. EXTREMITIES: No edema. CNS: Cranial nerves are intact.  Communicative.  Oriented to time place and person. SKIN: warm and dry, erythema over the skin.  Data Review: I have personally reviewed the following laboratory data and studies,  CBC: Recent Labs  Lab 05/21/23 1748 05/22/23 0541 05/24/23 0324 05/25/23 0939 05/26/23 0331  WBC 5.3 4.6 5.1 4.2 4.4  NEUTROABS 3.8  --   --   --   --   HGB 12.3* 10.6* 10.7* 10.3* 10.1*  HCT 35.7* 31.5* 32.3* 31.6* 30.9*  MCV 97.0 97.2 99.1 100.0 101.0*  PLT 220 208 164 154 142*   Basic Metabolic Panel: Recent Labs  Lab 05/21/23  2352 05/22/23 0541 05/25/23 0939 05/26/23 0331  NA 134* 135 138 138  K 4.0 4.4 3.9 4.0  CL 106 105 108 109  CO2 22 22 23 26   GLUCOSE 99 78 127* 104*  BUN 23 20 27* 24*  CREATININE 0.58* 0.66 0.90 0.80  CALCIUM  8.3* 8.7* 8.6* 8.7*  MG  --   --  2.1 2.0  PHOS  --   --  2.7  --    Liver Function Tests: No results for input(s): AST, ALT, ALKPHOS, BILITOT, PROT, ALBUMIN in the last 168 hours.  No results for input(s): LIPASE, AMYLASE in the last 168 hours. Recent Labs  Lab 05/25/23 1014  AMMONIA 28   Cardiac Enzymes: No results for input(s): CKTOTAL, CKMB, CKMBINDEX, TROPONINI in the last 168 hours. BNP (last 3 results) Recent Labs    03/28/23 0410 05/21/23 1748  BNP 67.8 184.5*    ProBNP (last 3 results) No results for input(s): PROBNP in the last 8760 hours.  CBG: Recent Labs  Lab 05/24/23 2249  GLUCAP 79   Recent Results (from the past 240 hours)  Blood culture (routine x 2)     Status: None   Collection Time: 05/21/23  5:48 PM   Specimen: BLOOD  Result Value Ref Range  Status   Specimen Description   Final    BLOOD LEFT ANTECUBITAL Performed at [facility], [address] [place], [place] [address]    Special Requests   Final    BOTTLES DRAWN AEROBIC AND ANAEROBIC Blood Culture results may not be optimal due to an inadequate volume of blood received in culture bottles Performed at [facility], [address] [place], [place] [address]    Culture   Final    NO GROWTH 5 DAYS Performed at [facility], [address], [place], [place] [address]    Report Status 05/26/2023 FINAL  Final  Urine Culture     Status: None   Collection Time: 05/21/23  6:57 PM   Specimen: Urine, Clean Catch  Result Value Ref Range Status   Specimen Description   Final    URINE, CLEAN CATCH Performed at [facility], [address], [place], [place] [address]    Special Requests   Final    NONE Performed at [facility], [address], [place], [place] [address]    Culture   Final    NO GROWTH Performed at [facility], [address] [place], [place] [address]    Report Status 05/22/2023 FINAL  Final  Blood culture (routine x 2)     Status: None (Preliminary result)   Collection Time: 05/21/23 11:52 PM   Specimen: BLOOD  Result Value Ref Range Status   Specimen Description   Final    BLOOD BLOOD RIGHT FOREARM Performed at [facility], [address], [place], [place] [address]    Special Requests   Final    BOTTLES DRAWN AEROBIC AND ANAEROBIC Blood Culture adequate volume Performed at [facility], [address], [place], [place] [address]    Culture   Final    NO GROWTH 4 DAYS Performed at [facility], [address] [place], [place] [address]    Report Status PENDING  Incomplete  MRSA Next Gen by PCR, Nasal     Status: None   Collection Time: 05/22/23  3:38 PM   Specimen: Nasal Mucosa; Nasal Swab  Result Value  Ref  Range Status   MRSA by PCR Next Gen NOT DETECTED NOT DETECTED Final    Comment: (NOTE) The GeneXpert MRSA Assay (FDA approved for NASAL specimens only), is one component of a comprehensive MRSA colonization surveillance program. It is not intended to diagnose MRSA infection nor to guide or monitor treatment for MRSA infections. Test performance is not FDA approved in patients less than 57 years old. Performed at [facility], [address], [place], [place] [address]      Studies: No results found.    Tiwanda Threats, MD  [facility] 05/26/2023  If 7PM-7AM, please contact night-coverage

## 2023-05-27 DIAGNOSIS — T68XXXA Hypothermia, initial encounter: Secondary | ICD-10-CM | POA: Diagnosis not present

## 2023-05-27 LAB — CULTURE, BLOOD (ROUTINE X 2)
Culture: NO GROWTH
Special Requests: ADEQUATE

## 2023-05-27 MED ORDER — SODIUM CHLORIDE 0.9 % IV SOLN: INTRAVENOUS | Status: DC

## 2023-05-27 MED ORDER — POTASSIUM CHLORIDE 10 MEQ/100ML IV SOLN: 10.0000 meq | INTRAVENOUS | Status: DC

## 2023-05-27 MED ORDER — POTASSIUM CHLORIDE CRYS ER 20 MEQ PO TBCR: 40.0000 meq | EXTENDED_RELEASE_TABLET | Freq: Once | ORAL | Status: DC

## 2023-05-27 NOTE — Plan of Care (Signed)

## 2023-05-27 NOTE — Progress Notes (Signed)
 PROGRESS NOTE  Kenneth Bowen FMW:[id number] DOB: 12-10-1930 DOA: 05/21/2023 PCP: Valentin Skates, DO   LOS: 6 days   Brief narrative:  Kenneth Bowen is a 89 y.o. male with medical history significant of bradycardia status post pacemaker, complete heart block, prostate cancer, history of seizure disorder presented to hospital from assisted living facility for low blood pressure.  Patient had gone to his cardiology clinic when he was recommended to come to the hospital for low blood pressure.  In the ED, he was hypotensive with systolic in the 80s.  Was noted to be hypothermic with temperature of 93 F.  Labs showed WBC at 5.3 with hemoglobin of 12.3. Depakote  level 29, lactic acid 1.6, urinalysis negative for infection.  Chest x-ray which showed no acute findings.  Patient was started on IV fluids and broad-spectrum antibiotics as well as Lawyer.  CT chest, abdomen and pelvis did not show any acute findings.  Patient was then admitted hospital for further evaluation and treatment.  Assessment/Plan  Hypotension, hypothermia. Patient presented with hypotension, hypothermia on presentation. Received fluid bolus via IV fluid during hospitalization.  Blood pressure is overall improved but still borderline low.  Lactate was 1.5.  CT chest abdomen pelvis without any overt signs of infection.  Urinalysis and chest x-ray was negative for infiltrate.  TSH was within normal limits at 3.8.   Blood cultures negative in 5 days.  Urine culture no growth.  Empirically patient was initially put on on vancomycin  and cefepime  but since cultures were negative antibiotics were discontinued 05/23/2023.  ACTH  stimulation test with adequate response.  Mentation and blood pressure including temperature has improved today after initiating low dose midodrine  2.5 mg twice daily. MRSA PCR negative.   Continue compression devices.  Mild confusion.  Improved.  Could be from hypothermia, volume depletion.  Oriented  today.  Hypothermia.  Needed Bair hugger again, patient states that his body temperature is usually low.  Latest temperature of 97.2.  Patient states that his temperature usually runs in the 97.  Generalized anxiety disorder-on Xanax  at home.  Currently on hold Continue trazodone  at nighttime.   Paroxysmal atrial fibrillation.-continue Eliquis    History of seizure-continue Depakote , Topamax .  Valproic  acid level was 29.   BPH-continue Flomax    Insomnia-continue trazodone    Chronic anemia-latest hemoglobin of 10.1 and has remained stable..  Debility, deconditioning.  PT has seen the patient and recommend skilled nursing facility placement.  Patient is from [facility] independent living facility.  DVT prophylaxis: Place and maintain sequential compression device Start: 05/26/23 1105 Place and maintain sequential compression device Start: 05/24/23 1257 apixaban  (ELIQUIS ) tablet 2.5 mg Start: 05/21/23 2200 SCDs Start: 05/21/23 2038 apixaban  (ELIQUIS ) tablet 2.5 mg   Disposition:  Likely to skilled nursing facility.  Status is: Inpatient  Remains inpatient appropriate because: need for skilled nursing facility placement.    Code Status:     Code Status: Limited: Do not attempt resuscitation (DNR) -DNR-LIMITED -Do Not Intubate/DNI   Family Communication:  Spoke with the patient's wife on the phone on 05/26/2023   Consultants: None  Procedures: None  Anti-infectives:  None currently  Anti-infectives (From admission, onward)    Start     Dose/Rate Route Frequency Ordered Stop   05/22/23 2130  Vancomycin  (VANCOCIN ) 1,500 mg in sodium chloride  0.9 % 500 mL IVPB  Status:  Discontinued       Placed in Followed by Linked Group   1,500 mg 250 mL/hr over 120 Minutes Intravenous Every 24 hours  05/21/23 2058 05/23/23 1058   05/21/23 2200  ceFEPIme  (MAXIPIME ) 2 g in sodium chloride  0.9 % 100 mL IVPB  Status:  Discontinued        2 g 200 mL/hr over 30 Minutes Intravenous  Every 8 hours 05/21/23 2058 05/23/23 1058   05/21/23 2130  Vancomycin  (VANCOCIN ) 1,500 mg in sodium chloride  0.9 % 500 mL IVPB       Placed in Followed by Linked Group   1,500 mg 250 mL/hr over 120 Minutes Intravenous  Once 05/21/23 2058 05/22/23 0010   05/21/23 1945  cefTRIAXone  (ROCEPHIN ) 1 g in sodium chloride  0.9 % 100 mL IVPB        1 g 200 mL/hr over 30 Minutes Intravenous  Once 05/21/23 1930 05/21/23 2007   05/21/23 1945  azithromycin  (ZITHROMAX ) 500 mg in sodium chloride  0.9 % 250 mL IVPB        500 mg 250 mL/hr over 60 Minutes Intravenous  Once 05/21/23 1930 05/21/23 2112       Subjective:  Today, patient was seen and examined at bedside.  Appears to be more alert awake and Communicative.  Denies any dizziness lightheadedness shortness of breath cough fever chills or rigor.  States that his temperature usually runs on the low side in the 97's.  Objective: Vitals:   05/27/23 0900 05/27/23 1000  BP: (!) 115/46 126/82  Pulse: 72 90  Resp: 12 (!) 25  Temp:    SpO2: 96% (!) 84%    Intake/Output Summary (Last 24 hours) at 05/27/2023 1257 Last data filed at 05/27/2023 0800 Gross per 24 hour  Intake 859.16 ml  Output 1175 ml  Net -315.84 ml   Filed Weights   05/22/23 1541  Weight: 78.8 kg   Body mass index is 24.93 kg/m.   Physical Exam:  GENERAL: Patient is alert awake oriented.  Not in obvious distress.  Elderly male. HENT: No scleral pallor or icterus. Pupils equally reactive to light. Oral mucosa is moist NECK: is supple, no gross swelling noted. CHEST: Clear to auscultation. No crackles or wheezes.   CVS: S1 and S2 heard, no murmur. Regular rate and rhythm.  ABDOMEN: Soft, non-tender, bowel sounds are present. EXTREMITIES: No edema. CNS: Cranial nerves are intact.   Oriented at this time. SKIN: warm and dry,   Data Review: I have personally reviewed the following laboratory data and studies,  CBC: Recent Labs  Lab 05/21/23 1748 05/22/23 0541  05/24/23 0324 05/25/23 0939 05/26/23 0331  WBC 5.3 4.6 5.1 4.2 4.4  NEUTROABS 3.8  --   --   --   --   HGB 12.3* 10.6* 10.7* 10.3* 10.1*  HCT 35.7* 31.5* 32.3* 31.6* 30.9*  MCV 97.0 97.2 99.1 100.0 101.0*  PLT 220 208 164 154 142*   Basic Metabolic Panel: Recent Labs  Lab 05/21/23 2352 05/22/23 0541 05/25/23 0939 05/26/23 0331  NA 134* 135 138 138  K 4.0 4.4 3.9 4.0  CL 106 105 108 109  CO2 22 22 23 26   GLUCOSE 99 78 127* 104*  BUN 23 20 27* 24*  CREATININE 0.58* 0.66 0.90 0.80  CALCIUM  8.3* 8.7* 8.6* 8.7*  MG  --   --  2.1 2.0  PHOS  --   --  2.7  --    Liver Function Tests: No results for input(s): AST, ALT, ALKPHOS, BILITOT, PROT, ALBUMIN in the last 168 hours.  No results for input(s): LIPASE, AMYLASE in the last 168 hours. Recent Labs  Lab 05/25/23 1014  AMMONIA 28   Cardiac Enzymes: No results for input(s): CKTOTAL, CKMB, CKMBINDEX, TROPONINI in the last 168 hours. BNP (last 3 results) Recent Labs    03/28/23 0410 05/21/23 1748  BNP 67.8 184.5*    ProBNP (last 3 results) No results for input(s): PROBNP in the last 8760 hours.  CBG: Recent Labs  Lab 05/24/23 2249  GLUCAP 79   Recent Results (from the past 240 hours)  Blood culture (routine x 2)     Status: None   Collection Time: 05/21/23  5:48 PM   Specimen: BLOOD  Result Value Ref Range Status   Specimen Description   Final    BLOOD LEFT ANTECUBITAL Performed at [facility], [address], [place], [place] [address]    Special Requests   Final    BOTTLES DRAWN AEROBIC AND ANAEROBIC Blood Culture results may not be optimal due to an inadequate volume of blood received in culture bottles Performed at [facility], [address], [place], [place] [address]    Culture   Final    NO GROWTH 5 DAYS Performed at [facility], [address] [place], [place] [address]    Report Status 05/26/2023 FINAL  Final  Urine  Culture     Status: None   Collection Time: 05/21/23  6:57 PM   Specimen: Urine, Clean Catch  Result Value Ref Range Status   Specimen Description   Final    URINE, CLEAN CATCH Performed at [facility], [address], [place], [place] [address]    Special Requests   Final    NONE Performed at [facility], [address] [place], [place] [address]    Culture   Final    NO GROWTH Performed at [facility], [address], [place], [place] [address]    Report Status 05/22/2023 FINAL  Final  Blood culture (routine x 2)     Status: None   Collection Time: 05/21/23 11:52 PM   Specimen: BLOOD  Result Value Ref Range Status   Specimen Description   Final    BLOOD BLOOD RIGHT FOREARM Performed at [facility], [address] [place], [place] [address]    Special Requests   Final    BOTTLES DRAWN AEROBIC AND ANAEROBIC Blood Culture adequate volume Performed at [facility], [address], [place], [place] [address]    Culture   Final    NO GROWTH 5 DAYS Performed at [facility], [address], [place], [place] [address]    Report Status 05/27/2023 FINAL  Final  MRSA Next Gen by PCR, Nasal     Status: None   Collection Time: 05/22/23  3:38 PM   Specimen: Nasal Mucosa; Nasal Swab  Result Value Ref Range Status   MRSA by PCR Next Gen NOT DETECTED NOT DETECTED Final    Comment: (NOTE) The GeneXpert MRSA Assay (FDA approved for NASAL specimens only), is one component of a comprehensive MRSA colonization surveillance program. It is not intended to diagnose MRSA infection nor to guide or monitor treatment for MRSA infections. Test performance is not FDA approved in patients less than 89 years old. Performed at [facility], [address] [place], [place] [address]      Studies: No results found.    Jesilyn Easom, MD  [facility] 05/27/2023  If 7PM-7AM,  please contact night-coverage

## 2023-05-27 NOTE — TOC Progression Note (Signed)
 Transition of Care [facility]) - Progression Note    Patient Details  Name: Kenneth Bowen MRN: [id number] Date of Birth: 01-21-1931  Transition of Care [facility]) CM/SW Contact  Janiylah Hannis, Nathanel, RN Phone Number: 05/27/2023, 1:40 PM  Clinical Narrative:  Patient spouse agreed to ST SNF-faxed out await bed offers. Prefers [address]       Barriers to Discharge: Continued Medical Work up  Expected Discharge Plan and Services                                               Social Determinants of Health (SDOH) Interventions SDOH Screenings   Food Insecurity: No Food Insecurity (05/22/2023)  Housing: Low Risk  (05/22/2023)  Transportation Needs: No Transportation Needs (05/22/2023)  Utilities: Not At Risk (05/22/2023)  Social Connections: Socially Integrated (05/22/2023)  Tobacco Use: Low Risk  (05/22/2023)    Readmission Risk Interventions     No data to display

## 2023-05-27 NOTE — Progress Notes (Signed)
 Physical Therapy Treatment Patient Details Name: Kenneth Bowen MRN: [id number] DOB: [birth year] Today's Date: 05/27/2023   History of Present Illness Pt is 89 year old presented to [facility] from MD office 05/21/23 for Low BP, hyporthermic.SABRA PMH:  chronic T10-L5 compression fractures,  chronic heart block status post pacemaker, Parkison like syndrome, prostate cancer, seizure disorder, right lower extremity DVT on Eliquis , chronic constipation, BPH, chronic lower back pain.    PT Comments  The patient is alert. Patient requires mod support for bed  mobility and +2 persons to stand and transfer to Recliner using RW . Patient  reports  that he was ambulatory(? Distance)  prior to a fall recently and used a WC PTA.  Patient will benefit from continued inpatient follow up therapy, <3 hours/day    If plan is discharge home, recommend the following: Two people to help with walking and/or transfers;Assist for transportation;A lot of help with bathing/dressing/bathroom   Can travel by private vehicle     No  Equipment Recommendations    none   Recommendations for Other Services       Precautions / Restrictions Precautions Precautions: Fall Restrictions Weight Bearing Restrictions Per Provider Order: No     Mobility  Bed Mobility Overal bed mobility: Needs Assistance Bed Mobility: Supine to Sit     Supine to sit: Mod assist, HOB elevated, Used rails     General bed mobility comments: assistance for trunk and scoot forward    Transfers Overall transfer level: Needs assistance Equipment used: Rolling walker (2 wheels) Transfers: Sit to/from Stand, Bed to chair/wheelchair/BSC Sit to Stand: +2 physical assistance, +2 safety/equipment, Max assist, From elevated surface   Step pivot transfers: +2 physical assistance, +2 safety/equipment, Max assist       General transfer comment: steady assist for balance , slow to side step and turn to recliner, cues to reach back     Ambulation/Gait                   Stairs             Wheelchair Mobility     Tilt Bed    Modified Rankin (Stroke Patients Only)       Balance   Sitting-balance support: Bilateral upper extremity supported, Feet supported Sitting balance-Leahy Scale: Fair     Standing balance support: Bilateral upper extremity supported, No upper extremity supported, During functional activity Standing balance-Leahy Scale: Poor Standing balance comment: reliant on  RW and external support                            Cognition Arousal: Alert Behavior During Therapy: WFL for tasks assessed/performed                                   General Comments: Oriented  to place, checked his watch for date        Exercises      General Comments        Pertinent Vitals/Pain Pain Assessment Pain Assessment: No/denies pain    Home Living                          Prior Function            PT Goals (current goals can now be found in the care plan section) Progress towards PT goals: Progressing  toward goals    Frequency    Min 1X/week      PT Plan      Co-evaluation              AM-PAC PT 6 Clicks Mobility   Outcome Measure  Help needed turning from your back to your side while in a flat bed without using bedrails?: A Lot Help needed moving from lying on your back to sitting on the side of a flat bed without using bedrails?: A Lot Help needed moving to and from a bed to a chair (including a wheelchair)?: A Lot Help needed standing up from a chair using your arms (e.g., wheelchair or bedside chair)?: A Lot Help needed to walk in hospital room?: Total Help needed climbing 3-5 steps with a railing? : Total 6 Click Score: 10    End of Session Equipment Utilized During Treatment: Gait belt Activity Tolerance: Patient tolerated treatment well Patient left: in chair;with chair alarm set;with call bell/phone within  reach Nurse Communication: Mobility status PT Visit Diagnosis: Unsteadiness on feet (R26.81)     Time: 8760-8698 PT Time Calculation (min) (ACUTE ONLY): 22 min  Charges:    $Therapeutic Activity: 8-22 mins PT General Charges $$ ACUTE PT VISIT: 1 Visit                     Darice Potters PT Acute Rehabilitation Services Office [phone] Weekend [phone]    Potters Darice Norris 05/27/2023, 1:46 PM

## 2023-05-27 NOTE — NC FL2 (Signed)
 [place]  MEDICAID FL2 LEVEL OF CARE FORM     IDENTIFICATION  Patient Name: Kenneth Bowen Birthdate: 28-Oct-1930 Sex: male Admission Date (Current Location): 05/21/2023  [facility] and [place] Number:  Producer, Television/film/video and Address:  [facility],  [address], [place] [address]      Provider Number: [id number]  Attending Physician Name and Address:  Sonjia Held, MD  Relative Name and Phone Number:  Tressie Lochner(spouse)[phone]    Current Level of Care: Hospital Recommended Level of Care: Skilled Nursing Facility Prior Approval Number:    Date Approved/Denied:   PASRR Number: [id number]  Discharge Plan: SNF    Current Diagnoses: Patient Active Problem List   Diagnosis Date Noted   Sepsis (HCC) 05/21/2023   Insomnia 03/25/2023   GAD (generalized anxiety disorder) 03/25/2023   Hyponatremia 03/25/2023   Fall at home, initial encounter 03/24/2023   Chronic lower back pain 03/24/2023   History of heart block 03/24/2023   Parkinsonian features 03/24/2023   Unstable gait 03/24/2023   Normocytic anemia 03/24/2023   History of pulmonary embolism 10/13/2022   S/P placement of cardiac pacemaker 09/06/2022   Falls 08/02/2022   History of DVT (deep vein thrombosis) 08/02/2022   History of T12 compression fracture (HCC) 08/02/2022   Pancytopenia (HCC) 08/02/2022   Seizure disorder (HCC) 08/02/2022   Elevated AST (SGOT) 08/02/2022   At risk for aspiration 06/15/2022   Palliative care encounter 06/15/2022   Acute respiratory failure with hypoxia (HCC) 06/05/2022   Physical deconditioning 06/05/2022   Heart block AV complete (HCC) 06/04/2022   Altered mental status 06/03/2022   Suspected stroke patient last known to be well 2 to 3 hours ago 06/02/2022   Therapeutic drug monitoring 06/10/2017   Complex partial seizure (HCC) 12/05/2016   Syncope 07/29/2016   BPH (benign prostatic hyperplasia) 07/28/2016   Dyslipidemia 07/28/2016   Anxiety     History of prostate cancer    TIA (transient ischemic attack) 07/12/2016    Orientation RESPIRATION BLADDER Height & Weight     Self, Time, Situation, Place  Normal Continent Weight: 78.8 kg Height:  5' 10 (177.8 cm)  BEHAVIORAL SYMPTOMS/MOOD NEUROLOGICAL BOWEL NUTRITION STATUS      Continent Diet (Regular)  AMBULATORY STATUS COMMUNICATION OF NEEDS Skin   Limited Assist Verbally Normal                       Personal Care Assistance Level of Assistance  Bathing, Feeding, Dressing Bathing Assistance: Limited assistance Feeding assistance: Limited assistance Dressing Assistance: Limited assistance     Functional Limitations Info  Sight, Hearing, Speech Sight Info: Impaired (eyeglasses) Hearing Info: Adequate Speech Info: Adequate    SPECIAL CARE FACTORS FREQUENCY  PT (By licensed PT), OT (By licensed OT)     PT Frequency: 5x week OT Frequency: 5x week            Contractures Contractures Info: Not present    Additional Factors Info  Code Status, Allergies Code Status Info: DNR Allergies Info: Tape, Chlorhexidine            Current Medications (05/27/2023):  This is the current hospital active medication list Current Facility-Administered Medications  Medication Dose Route Frequency Provider Last Rate Last Admin   0.9 %  sodium chloride  infusion   Intravenous Continuous Daniels, James K, NP 75 mL/hr at 05/27/23 0800 Infusion Verify at 05/27/23 0800   acetaminophen  (TYLENOL ) tablet 650 mg  650 mg Oral Q6H PRN Dorrell,  Lamar, MD       Or   acetaminophen  (TYLENOL ) suppository 650 mg  650 mg Rectal Q6H PRN Dorrell, Robert, MD       apixaban  (ELIQUIS ) tablet 2.5 mg  2.5 mg Oral BID Dena Lamar, MD   2.5 mg at 05/27/23 9048   divalproex  (DEPAKOTE  ER) 24 hr tablet 500 mg  500 mg Oral Daily Dorrell, Lamar, MD   500 mg at 05/27/23 9048   feeding supplement (ENSURE ENLIVE / ENSURE PLUS) liquid 237 mL  237 mL Oral BID BM Pokhrel, Laxman, MD   237 mL at 05/27/23  0951   hydrocerin (EUCERIN) cream   Topical BID Pokhrel, Laxman, MD   Given at 05/27/23 0951   hydrOXYzine  (ATARAX ) tablet 10 mg  10 mg Oral TID PRN Pokhrel, Laxman, MD   10 mg at 05/23/23 1208   lactulose  (CHRONULAC ) 10 GM/15ML solution 10 g  10 g Oral BID Dena Lamar, MD   10 g at 05/26/23 9083   midodrine  (PROAMATINE ) tablet 2.5 mg  2.5 mg Oral BID WC Pokhrel, Laxman, MD   2.5 mg at 05/27/23 9182   ondansetron  (ZOFRAN ) tablet 4 mg  4 mg Oral Q6H PRN Dena Lamar, MD       Or   ondansetron  (ZOFRAN ) injection 4 mg  4 mg Intravenous Q6H PRN Dorrell, Robert, MD       Oral care mouth rinse  15 mL Mouth Rinse PRN Pokhrel, Laxman, MD       oxyCODONE  (Oxy IR/ROXICODONE ) immediate release tablet 5 mg  5 mg Oral Q4H PRN Dena Lamar, MD   5 mg at 05/26/23 2121   pantoprazole  (PROTONIX ) EC tablet 40 mg  40 mg Oral Daily Dorrell, Robert, MD   40 mg at 05/27/23 9048   tamsulosin  (FLOMAX ) capsule 0.4 mg  0.4 mg Oral Daily Dena Lamar, MD   0.4 mg at 05/27/23 9048   topiramate  (TOPAMAX ) tablet 50 mg  50 mg Oral QHS Dorrell, Robert, MD   50 mg at 05/26/23 2122   traZODone  (DESYREL ) tablet 50 mg  50 mg Oral QHS Dorrell, Robert, MD   50 mg at 05/26/23 2121     Discharge Medications: Please see discharge summary for a list of discharge medications.  Relevant Imaging Results:  Relevant Lab Results:   Additional Information SS#[id number] [address] [place], [place]

## 2023-05-27 NOTE — Progress Notes (Signed)
 Oriented patient to room. Patient sitting in recliner.

## 2023-05-27 NOTE — Plan of Care (Signed)

## 2023-05-28 DIAGNOSIS — T68XXXA Hypothermia, initial encounter: Secondary | ICD-10-CM | POA: Diagnosis not present

## 2023-05-28 MED ORDER — ENSURE ENLIVE PO LIQD: 237.0000 mL | Freq: Two times a day (BID) | ORAL | Status: DC

## 2023-05-28 MED ORDER — MIDODRINE HCL 2.5 MG PO TABS: 2.5000 mg | ORAL_TABLET | Freq: Two times a day (BID) | ORAL | 0 refills | Status: AC

## 2023-05-28 MED ORDER — HYDROCERIN EX CREA: 1.0000 | TOPICAL_CREAM | Freq: Two times a day (BID) | CUTANEOUS | Status: DC

## 2023-05-28 MED ORDER — FUROSEMIDE 40 MG PO TABS: 20.0000 mg | ORAL_TABLET | Freq: Every morning | ORAL | Status: DC

## 2023-05-28 MED ORDER — TRAMADOL HCL 50 MG PO TABS: 50.0000 mg | ORAL_TABLET | Freq: Four times a day (QID) | ORAL | 0 refills | Status: DC | PRN

## 2023-05-28 NOTE — TOC Transition Note (Addendum)
 Transition of Care [facility]) - Discharge Note   Patient Details  Name: Kenneth Bowen MRN: [id number] Date of Birth: 09-13-1930  Transition of [facility]) CM/SW Contact:  Dalila Camellia SAUNDERS, LCSW Phone Number: 05/28/2023, 12:19 PM   Clinical Narrative:     CSW spoke to patient's wife Kenneth Bowen [phone], and it was confirmed that she would like [facility] for short term rehab.  CSW spoke to [facility] and she confirmed bed is available and they can accept her today.  CSW updated attending physician and bedside nurse.  CSW attempted to contact patient's wife to let her know he is discharging today.  Unable to leave a message on voice mail.   Patient to be d/c'ed today to [facility] room 604 P.  Patient and family agreeable to plans will transport via ems RN to call report to [phone].  EMS called at 1:15pm, [phone]     Final next level of care: Skilled Nursing Facility Barriers to Discharge: Barriers Resolved   Patient Goals and CMS Choice Patient states their goals for this hospitalization and ongoing recovery are:: To go to SNF for short term rehab, then return back home to [facility] with his wife. CMS Medicare.gov Compare Post Acute Care list provided to:: Patient Represenative (must comment) Choice offered to / list presented to : Spouse Kenneth Bowen ownership interest in [facility].provided to:: Spouse    Discharge Placement   Existing PASRR number confirmed : 05/27/23          Patient chooses bed at: [facility] Patient to be transferred to facility by: [facility] Name of family member notified: Patient's wife Kenneth Bowen  [phone] Patient and family notified of of transfer: 05/28/23  Discharge Plan and Services Additional resources added to the After Visit Summary for                                       Social Drivers of Health (SDOH) Interventions SDOH Screenings   Food Insecurity: No Food Insecurity (05/22/2023)  Housing:  Low Risk  (05/22/2023)  Transportation Needs: No Transportation Needs (05/22/2023)  Utilities: Not At Risk (05/22/2023)  Social Connections: Socially Integrated (05/22/2023)  Tobacco Use: Low Risk  (05/22/2023)     Readmission Risk Interventions     No data to display

## 2023-05-28 NOTE — Progress Notes (Signed)
 PROGRESS NOTE  Kenneth Bowen FMW:[id number] DOB: [birth year] DOA: 05/21/2023 PCP: Valentin Skates, DO   LOS: 7 days   Brief narrative:  Kenneth Bowen is a 89 y.o. male with medical history significant of bradycardia status post pacemaker, complete heart block, prostate cancer, history of seizure disorder presented to hospital from assisted living facility for low blood pressure.  Patient had gone to his cardiology clinic when he was recommended to come to the hospital for low blood pressure.  In the ED, he was hypotensive with systolic in the 80s.  Was noted to be hypothermic with temperature of 93 F.  Labs showed WBC at 5.3 with hemoglobin of 12.3. Depakote  level 29, lactic acid 1.6, urinalysis negative for infection.  Chest x-ray which showed no acute findings.  Patient was started on IV fluids and broad-spectrum antibiotics as well as Lawyer.  CT chest, abdomen and pelvis did not show any acute findings.  Patient was then admitted hospital for further evaluation and treatment.  During hospitalization, patient's hypotension and hypothermia gradually improved.  Has baseline low body temperature.  Sepsis was ruled out.  Was started on low-dose midodrine  with improvement in blood pressure which I think might be continued for few days on discharge with outpatient titration.  Has been seen by physical therapy and recommended skilled nursing facility placement.  Medically stable for disposition.  Assessment/Plan  Hypotension, hypothermia.  Patient presented with hypotension, hypothermia on presentation. Received fluid bolus via IV fluid during hospitalization. Lactate was 1.5.  CT chest abdomen pelvis without any overt signs of infection.  Urinalysis and chest x-ray was negative for infiltrate.  TSH was within normal limits at 3.8.   Cultures and urine cultures were negative.  Empirically patient was initially put on on vancomycin  and cefepime  but since cultures were negative antibiotics were  discontinued 05/23/2023.  ACTH  stimulation test with adequate response.    MRSA PCR negative.   Continue compression devices.Patient was initiated on low-dose midodrine  which seem to help his blood pressure well.  I think this might need to be titrated as outpatient on discharge.  Mild confusion.  Improved.  Could be from hypothermia, volume depletion.  Oriented.  Hypothermia.  Needed Bair hugger again, patient states that his body temperature is usually low.  Latest temperature of 97..  Patient states that his temperature usually runs in the 97.  Generalized anxiety disorder-on Xanax  at home.  Currently on hold, Continue trazodone  at nighttime.   Paroxysmal atrial fibrillation.-continue Eliquis    History of seizure-continue Depakote , Topamax .  Valproic  acid level was 29.   BPH-continue Flomax    Insomnia-continue trazodone    Chronic anemia-latest hemoglobin of 10.1 and has remained stable..  Debility, deconditioning.  PT recommends skilled nursing facility placement.  Patient is from [facility] independent living facility.  Spoke with the patient's wife on the phone.  DVT prophylaxis: Place and maintain sequential compression device Start: 05/26/23 1105 Place and maintain sequential compression device Start: 05/24/23 1257 apixaban  (ELIQUIS ) tablet 2.5 mg Start: 05/21/23 2200 SCDs Start: 05/21/23 2038 apixaban  (ELIQUIS ) tablet 2.5 mg   Disposition:  Likely to skilled nursing facility.  Status is: Inpatient  Remains inpatient appropriate because: need for skilled nursing facility placement.  Medically stable for disposition.    Code Status:     Code Status: Limited: Do not attempt resuscitation (DNR) -DNR-LIMITED -Do Not Intubate/DNI   Family Communication:  Spoke with the patient's wife on the phone on 05/28/2023   Consultants: None  Procedures: None  Anti-infectives:  None currently  Anti-infectives (From admission, onward)    Start     Dose/Rate Route Frequency  Ordered Stop   05/22/23 2130  Vancomycin  (VANCOCIN ) 1,500 mg in sodium chloride  0.9 % 500 mL IVPB  Status:  Discontinued       Placed in Followed by Linked Group   1,500 mg 250 mL/hr over 120 Minutes Intravenous Every 24 hours 05/21/23 2058 05/23/23 1058   05/21/23 2200  ceFEPIme  (MAXIPIME ) 2 g in sodium chloride  0.9 % 100 mL IVPB  Status:  Discontinued        2 g 200 mL/hr over 30 Minutes Intravenous Every 8 hours 05/21/23 2058 05/23/23 1058   05/21/23 2130  Vancomycin  (VANCOCIN ) 1,500 mg in sodium chloride  0.9 % 500 mL IVPB       Placed in Followed by Linked Group   1,500 mg 250 mL/hr over 120 Minutes Intravenous  Once 05/21/23 2058 05/22/23 0010   05/21/23 1945  cefTRIAXone  (ROCEPHIN ) 1 g in sodium chloride  0.9 % 100 mL IVPB        1 g 200 mL/hr over 30 Minutes Intravenous  Once 05/21/23 1930 05/21/23 2007   05/21/23 1945  azithromycin  (ZITHROMAX ) 500 mg in sodium chloride  0.9 % 250 mL IVPB        500 mg 250 mL/hr over 60 Minutes Intravenous  Once 05/21/23 1930 05/21/23 2112       Subjective:  Today, patient was seen and examined at bedside.  Patient denies interval complaints.  Denies any nausea vomiting fever chills or rigor.  Denies any shortness of breath.    Objective: Vitals:   05/28/23 0423 05/28/23 0853  BP: (!) 147/96 (!) 144/88  Pulse: 68 63  Resp: 17   Temp: 98.3 F (36.8 C)   SpO2: 98%     Intake/Output Summary (Last 24 hours) at 05/28/2023 1026 Last data filed at 05/28/2023 0500 Gross per 24 hour  Intake 240 ml  Output 1000 ml  Net -760 ml   Filed Weights   05/22/23 1541  Weight: 78.8 kg   Body mass index is 24.93 kg/m.   Physical Exam:  GENERAL: Patient is alert awake oriented.  Not in obvious distress.  Elderly male. HENT: No scleral pallor or icterus. Pupils equally reactive to light. Oral mucosa is moist NECK: is supple, no gross swelling noted. CHEST: Clear to auscultation. No crackles or wheezes.   CVS: S1 and S2 heard, no murmur.  Regular rate and rhythm.  ABDOMEN: Soft, non-tender, bowel sounds are present. EXTREMITIES: No edema. CNS: Cranial nerves are intact.   Oriented SKIN: warm and dry, mild erythema but improved.  Data Review: I have personally reviewed the following laboratory data and studies,  CBC: Recent Labs  Lab 05/21/23 1748 05/22/23 0541 05/24/23 0324 05/25/23 0939 05/26/23 0331  WBC 5.3 4.6 5.1 4.2 4.4  NEUTROABS 3.8  --   --   --   --   HGB 12.3* 10.6* 10.7* 10.3* 10.1*  HCT 35.7* 31.5* 32.3* 31.6* 30.9*  MCV 97.0 97.2 99.1 100.0 101.0*  PLT 220 208 164 154 142*   Basic Metabolic Panel: Recent Labs  Lab 05/21/23 2352 05/22/23 0541 05/25/23 0939 05/26/23 0331  NA 134* 135 138 138  K 4.0 4.4 3.9 4.0  CL 106 105 108 109  CO2 22 22 23 26   GLUCOSE 99 78 127* 104*  BUN 23 20 27* 24*  CREATININE 0.58* 0.66 0.90 0.80  CALCIUM  8.3* 8.7* 8.6* 8.7*  MG  --   --  2.1 2.0  PHOS  --   --  2.7  --    Liver Function Tests: No results for input(s): AST, ALT, ALKPHOS, BILITOT, PROT, ALBUMIN in the last 168 hours.  No results for input(s): LIPASE, AMYLASE in the last 168 hours. Recent Labs  Lab 05/25/23 1014  AMMONIA 28   Cardiac Enzymes: No results for input(s): CKTOTAL, CKMB, CKMBINDEX, TROPONINI in the last 168 hours. BNP (last 3 results) Recent Labs    03/28/23 0410 05/21/23 1748  BNP 67.8 184.5*    ProBNP (last 3 results) No results for input(s): PROBNP in the last 8760 hours.  CBG: Recent Labs  Lab 05/24/23 2249  GLUCAP 79   Recent Results (from the past 240 hours)  Blood culture (routine x 2)     Status: None   Collection Time: 05/21/23  5:48 PM   Specimen: BLOOD  Result Value Ref Range Status   Specimen Description   Final    BLOOD LEFT ANTECUBITAL Performed at [facility], [address], [place], [place] [address]    Special Requests   Final    BOTTLES DRAWN AEROBIC AND ANAEROBIC Blood Culture results may not be  optimal due to an inadequate volume of blood received in culture bottles Performed at [facility], [address], [place], [place] [address]    Culture   Final    NO GROWTH 5 DAYS Performed at [facility], [address], [place], [place] [address]    Report Status 05/26/2023 FINAL  Final  Urine Culture     Status: None   Collection Time: 05/21/23  6:57 PM   Specimen: Urine, Clean Catch  Result Value Ref Range Status   Specimen Description   Final    URINE, CLEAN CATCH Performed at [facility], [address], [place], [place] [address]    Special Requests   Final    NONE Performed at [facility], [address] [place], [place] [address]    Culture   Final    NO GROWTH Performed at [facility], [address] [place], [place] [address]    Report Status 05/22/2023 FINAL  Final  Blood culture (routine x 2)     Status: None   Collection Time: 05/21/23 11:52 PM   Specimen: BLOOD  Result Value Ref Range Status   Specimen Description   Final    BLOOD BLOOD RIGHT FOREARM Performed at [facility], [address], [place], [place] [address]    Special Requests   Final    BOTTLES DRAWN AEROBIC AND ANAEROBIC Blood Culture adequate volume Performed at [facility], [address], [place], [place] [address]    Culture   Final    NO GROWTH 5 DAYS Performed at [facility], [address], [place], [place] [address]    Report Status 05/27/2023 FINAL  Final  MRSA Next Gen by PCR, Nasal     Status: None   Collection Time: 05/22/23  3:38 PM   Specimen: Nasal Mucosa; Nasal Swab  Result Value Ref Range Status   MRSA by PCR Next Gen NOT DETECTED NOT DETECTED Final    Comment: (NOTE) The GeneXpert MRSA Assay (FDA approved for NASAL specimens only), is one component of a comprehensive MRSA colonization surveillance program. It is not intended to diagnose MRSA  infection nor to guide or monitor treatment for MRSA infections. Test performance is not FDA approved  in patients less than 78 years old. Performed at [facility], [address], [place], [place] [address]      Studies: No results found.    Meilyn Heindl, MD  [facility] 05/28/2023  If 7PM-7AM, please contact night-coverage

## 2023-05-28 NOTE — Discharge Summary (Signed)
 Physician Discharge Summary  Kenneth Bowen FMW:[id number] DOB: 10/24/1930 DOA: 05/21/2023  PCP: Valentin Skates, DO  Admit date: 05/21/2023 Discharge date: 05/28/2023  Admitted From: Home  Discharge disposition: Skilled nursing facility  Recommendations for Outpatient Follow-Up:   Follow up with your primary care provider at the skilled nursing facility in 3 to 5 days. Check CBC, BMP, magnesium  in the next visit  Discharge Diagnosis:   Discharge Condition: Improved.  Diet recommendation:  Regular.  Wound care: None.  Code status: Full.   History of Present Illness:   Kenneth Bowen is a 89 y.o. male with medical history significant of bradycardia status post pacemaker, complete heart block, prostate cancer, history of seizure disorder presented to hospital from assisted living facility for low blood pressure.  Patient had gone to his cardiology clinic when he was recommended to come to the hospital for low blood pressure.  In the ED, he was hypotensive with systolic in the 80s.  Was noted to be hypothermic with temperature of 93 F.  Labs showed WBC at 5.3 with hemoglobin of 12.3. Depakote  level 29, lactic acid 1.6, urinalysis negative for infection.  Chest x-ray which showed no acute findings.  Patient was started on IV fluids and broad-spectrum antibiotics as well as Lawyer.  CT chest, abdomen and pelvis did not show any acute findings.  Patient was then admitted hospital for further evaluation and treatment.   Hospital Course:   Following conditions were addressed during hospitalization as listed below,  Hypotension, hypothermia. Sepsis ruled out. Patient presented with hypotension, hypothermia on presentation. Received fluid bolus via IV fluid during hospitalization. Lactate was 1.5.  CT chest abdomen pelvis without any overt signs of infection.  Urinalysis and chest x-ray was negative for infiltrate.  TSH was within normal limits at 3.8.   Cultures and urine  cultures were negative.  Empirically patient was initially put on on vancomycin  and cefepime  but since cultures were negative antibiotics were discontinued 05/23/2023.  ACTH  stimulation test with adequate response.    MRSA PCR negative.   Patient was initiated on low-dose midodrine  which seem to help his blood pressure well.  Will continue for next 5 days and discontinue.  Has improved at this time.   Mild confusion.  Improved.  Could be from hypothermia, volume depletion.  Oriented at this time.   Hypothermia.  Needed Bair hugger again, patient states that his body temperature is usually low.  Latest temperature of 97..  Patient states that his temperature usually runs in the 97 Fahrenheit..   Generalized anxiety disorder-on Xanax , trazodone  at nighttime.   Paroxysmal atrial fibrillation.-continue Eliquis    History of seizure-continue Depakote , Topamax .  Valproic  acid level was 29.   BPH-continue Flomax    Insomnia-continue trazodone    Chronic anemia-latest hemoglobin of 10.1 and has remained stable..   Debility, deconditioning.  PT recommends skilled nursing facility placement.  Patient is from [facility] independent living facility.    Disposition.  At this time, patient is stable for disposition to skilled nursing facility.  Spoke with the patient's spouse on the phone.  Medical Consultants:   None.  Procedures:    None Subjective:   Today, patient was seen and examined at bedside.  Feels better.Denies any nausea vomiting fever chills or rigor. Denies any shortness of breath.   Discharge Exam:   Vitals:   05/28/23 0853 05/28/23 1159  BP: (!) 144/88 121/73  Pulse: 63 (!) 57  Resp:  18  Temp:  97.9 F (36.6 C)  SpO2:  98%   Vitals:   05/27/23 1955 05/28/23 0423 05/28/23 0853 05/28/23 1159  BP: 115/88 (!) 147/96 (!) 144/88 121/73  Pulse: 60 68 63 (!) 57  Resp: 17 17  18   Temp: 97.6 F (36.4 C) 98.3 F (36.8 C)  97.9 F (36.6 C)  TempSrc:      SpO2: 97% 98%   98%  Weight:      Height:       GENERAL: Patient is alert awake oriented.  Not in obvious distress.  Elderly male. HENT: No scleral pallor or icterus. Pupils equally reactive to light. Oral mucosa is moist NECK: is supple, no gross swelling noted. CHEST: Clear to auscultation. No crackles or wheezes.   CVS: S1 and S2 heard, no murmur. Regular rate and rhythm.  ABDOMEN: Soft, non-tender, bowel sounds are present. EXTREMITIES: No edema. CNS: Cranial nerves are intact.   Oriented SKIN: warm and dry, mild erythema but improved.  The results of significant diagnostics from this hospitalization (including imaging, microbiology, ancillary and laboratory) are listed below for reference.     Diagnostic Studies:   CT CHEST ABDOMEN PELVIS W CONTRAST Result Date: 05/22/2023 CLINICAL DATA:  Sepsis, hypotension. EXAM: CT CHEST, ABDOMEN, AND PELVIS WITH CONTRAST TECHNIQUE: Multidetector CT imaging of the chest, abdomen and pelvis was performed following the standard protocol during bolus administration of intravenous contrast. RADIATION DOSE REDUCTION: This exam was performed according to the departmental dose-optimization program which includes automated exposure control, adjustment of the mA and/or kV according to patient size and/or use of iterative reconstruction technique. CONTRAST:  OMNIPAQUE  IOHEXOL  300 MG/ML  SOLN COMPARISON:  08/02/2022 FINDINGS: CT CHEST FINDINGS Cardiovascular: Heart is normal size. Aorta is normal caliber. Pacer wires in the right heart. Moderate coronary artery and aortic atherosclerosis. Mediastinum/Nodes: No mediastinal, hilar, or axillary adenopathy. Trachea and esophagus are unremarkable. Thyroid unremarkable. Lungs/Pleura: Areas of linear scarring or atelectasis in the lingula and both lower lobes at the lung bases. No acute confluent areas of consolidation or effusions. Musculoskeletal: Chest wall soft tissues are unremarkable. No acute bony abnormality. Severe chronic  compression fracture at T12. CT ABDOMEN PELVIS FINDINGS Hepatobiliary: No focal hepatic abnormality. Gallbladder unremarkable. Pancreas: No focal abnormality or ductal dilatation. Spleen: No focal abnormality.  Normal size. Adrenals/Urinary Tract: No adrenal abnormality. No focal renal abnormality. No stones or hydronephrosis. Urinary bladder is unremarkable. Stomach/Bowel: Moderate stool burden throughout the colon. Normal appendix. Stomach, large and small bowel grossly unremarkable. Vascular/Lymphatic: No evidence of aneurysm or adenopathy. Reproductive: No visible focal abnormality. Other: No free fluid or free air. Musculoskeletal: No acute bony abnormality. Severe chronic multilevel compression fractures involving all lumbar vertebral bodies. IMPRESSION: No acute findings in the chest, abdomen or pelvis. Linear and platelike densities in the lung bases most compatible with scarring or atelectasis. Coronary artery disease, aortic atherosclerosis. Sigmoid diverticulosis. Moderate stool burden throughout the colon. Electronically Signed   By: Franky Crease M.D.   On: 05/22/2023 01:17   DG Chest Port 1 View Result Date: 05/21/2023 CLINICAL DATA:  Hypotension and weakness. Concern for infection. Burning with urination. EXAM: PORTABLE CHEST 1 VIEW COMPARISON:  05/18/2023 FINDINGS: Cardiac pacemaker. Shallow inspiration with linear atelectasis in the lung bases, similar to prior study. No airspace disease or consolidation. No pleural effusions. No pneumothorax. Mediastinal contours appear intact. IMPRESSION: Shallow inspiration with linear atelectasis in the lung bases, similar to prior study. Electronically Signed   By: Elsie Gravely M.D.   On: 05/21/2023 17:21     Labs:  Basic Metabolic Panel: Recent Labs  Lab 05/21/23 2352 05/22/23 0541 05/25/23 0939 05/26/23 0331  NA 134* 135 138 138  K 4.0 4.4 3.9 4.0  CL 106 105 108 109  CO2 22 22 23 26   GLUCOSE 99 78 127* 104*  BUN 23 20 27* 24*   CREATININE 0.58* 0.66 0.90 0.80  CALCIUM  8.3* 8.7* 8.6* 8.7*  MG  --   --  2.1 2.0  PHOS  --   --  2.7  --    GFR Estimated Creatinine Clearance: 63.4 mL/min (by C-G formula based on SCr of 0.8 mg/dL). Liver Function Tests: No results for input(s): AST, ALT, ALKPHOS, BILITOT, PROT, ALBUMIN in the last 168 hours. No results for input(s): LIPASE, AMYLASE in the last 168 hours. Recent Labs  Lab 05/25/23 1014  AMMONIA 28   Coagulation profile Recent Labs  Lab 05/22/23 0541  INR 1.1    CBC: Recent Labs  Lab 05/21/23 1748 05/22/23 0541 05/24/23 0324 05/25/23 0939 05/26/23 0331  WBC 5.3 4.6 5.1 4.2 4.4  NEUTROABS 3.8  --   --   --   --   HGB 12.3* 10.6* 10.7* 10.3* 10.1*  HCT 35.7* 31.5* 32.3* 31.6* 30.9*  MCV 97.0 97.2 99.1 100.0 101.0*  PLT 220 208 164 154 142*   Cardiac Enzymes: No results for input(s): CKTOTAL, CKMB, CKMBINDEX, TROPONINI in the last 168 hours. BNP: Invalid input(s): POCBNP CBG: Recent Labs  Lab 05/24/23 2249  GLUCAP 79   D-Dimer No results for input(s): DDIMER in the last 72 hours. Hgb A1c No results for input(s): HGBA1C in the last 72 hours. Lipid Profile No results for input(s): CHOL, HDL, LDLCALC, TRIG, CHOLHDL, LDLDIRECT in the last 72 hours. Thyroid function studies No results for input(s): TSH, T4TOTAL, T3FREE, THYROIDAB in the last 72 hours.  Invalid input(s): FREET3 Anemia work up No results for input(s): VITAMINB12, FOLATE, FERRITIN, TIBC, IRON , RETICCTPCT in the last 72 hours. Microbiology Recent Results (from the past 240 hours)  Blood culture (routine x 2)     Status: None   Collection Time: 05/21/23  5:48 PM   Specimen: BLOOD  Result Value Ref Range Status   Specimen Description   Final    BLOOD LEFT ANTECUBITAL Performed at [facility], [address], [place], [place] [address]    Special Requests   Final    BOTTLES DRAWN AEROBIC AND  ANAEROBIC Blood Culture results may not be optimal due to an inadequate volume of blood received in culture bottles Performed at [facility], [address] [place], [place] [address]    Culture   Final    NO GROWTH 5 DAYS Performed at [facility], [address], [place], [place] [address]    Report Status 05/26/2023 FINAL  Final  Urine Culture     Status: None   Collection Time: 05/21/23  6:57 PM   Specimen: Urine, Clean Catch  Result Value Ref Range Status   Specimen Description   Final    URINE, CLEAN CATCH Performed at [facility], [address] [place], [place] [address]    Special Requests   Final    NONE Performed at [facility], [address] [place], [place] [address]    Culture   Final    NO GROWTH Performed at [facility], [address], [place], [place] [address]    Report Status 05/22/2023 FINAL  Final  Blood culture (routine x  2)     Status: None   Collection Time: 05/21/23 11:52 PM   Specimen: BLOOD  Result Value Ref Range Status   Specimen Description   Final    BLOOD BLOOD RIGHT FOREARM Performed at [facility], [address] [place], [place] [address]    Special Requests   Final    BOTTLES DRAWN AEROBIC AND ANAEROBIC Blood Culture adequate volume Performed at [facility], [address], [place], [place] [address]    Culture   Final    NO GROWTH 5 DAYS Performed at [facility], [address], [place], [place] [address]    Report Status 05/27/2023 FINAL  Final  MRSA Next Gen by PCR, Nasal     Status: None   Collection Time: 05/22/23  3:38 PM   Specimen: Nasal Mucosa; Nasal Swab  Result Value Ref Range Status   MRSA by PCR Next Gen NOT DETECTED NOT DETECTED Final    Comment: (NOTE) The GeneXpert MRSA Assay (FDA approved for NASAL specimens only), is one component of a comprehensive MRSA colonization surveillance program.  It is not intended to diagnose MRSA infection nor to guide or monitor treatment for MRSA infections. Test performance is not FDA approved in patients less than 71 years old. Performed at [facility], [address], [place], [place] [address]      Discharge Instructions:   Discharge Instructions     Diet general   Complete by: As directed    Discharge instructions   Complete by: As directed    Follow-up with your primary care provider at the skilled nursing facility in 3 to 5 days.  Check blood work at that time.  Seek medical attention for worsening symptoms.   Increase activity slowly   Complete by: As directed       Allergies as of 05/28/2023       Reactions   Tape Other (See Comments)   Skin tearing!! SKIN TEARS AND BRUISES VERY EASILY!!   Chlorhexidine  Rash        Medication List     TAKE these medications    ALPRAZolam  0.5 MG tablet Commonly known as: XANAX  Take 0.5 mg by mouth at bedtime.   ARTIFICIAL TEAR OP Place 1 drop into both eyes at bedtime as needed (dry eye).   desoximetasone 0.25 % cream Commonly known as: TOPICORT Apply 1 Application topically at bedtime.   divalproex  500 MG 24 hr tablet Commonly known as: DEPAKOTE  ER TAKE 1 TABLET (500 MG TOTAL) BY MOUTH DAILY.   Eliquis  2.5 MG Tabs tablet Generic drug: apixaban  Take 1 tablet (2.5 mg total) by mouth 2 (two) times daily. Resume from 06/10/22 What changed: additional instructions   feeding supplement Liqd Take 237 mLs by mouth 2 (two) times daily between meals.   ferrous sulfate  325 (65 FE) MG tablet Take 325 mg by mouth daily with breakfast.   folic acid  1 MG tablet Commonly known as: FOLVITE  Take 1 tablet (1 mg total) by mouth daily.   furosemide  40 MG tablet Commonly known as: LASIX  Take 0.5 tablets (20 mg total) by mouth in the morning. What changed: how much to take   hydrocerin Crea Apply 1 Application topically 2 (two) times daily. Dry skin    lactulose  10 GM/15ML solution Commonly known as: CHRONULAC  Take 15 mLs (10 g total) by mouth 2 (two) times daily.   midodrine  2.5 MG tablet Commonly known as: PROAMATINE  Take 1 tablet (2.5 mg  total) by mouth 2 (two) times daily with a meal for 5 days.   mometasone 0.1 % ointment Commonly known as: ELOCON Apply 1 application topically 2 (two) times daily as needed (rash).   multivitamin tablet Take 1 tablet by mouth daily.   mupirocin  ointment 2 % Commonly known as: BACTROBAN  Apply 1 Application topically 2 (two) times daily.   omeprazole 20 MG capsule Commonly known as: PRILOSEC Take 20 mg by mouth daily as needed (for heartburn).   tamsulosin  0.4 MG Caps capsule Commonly known as: FLOMAX  TAKE 1 CAPSULE (0.4 MG TOTAL) BY MOUTH DAILY AFTER SUPPER. What changed: when to take this   topiramate  50 MG tablet Commonly known as: TOPAMAX  Take 1 tablet (50 mg total) by mouth at bedtime.   traMADol  50 MG tablet Commonly known as: ULTRAM  Take 1 tablet (50 mg total) by mouth every 6 (six) hours as needed for moderate pain (pain score 4-6).   traZODone  50 MG tablet Commonly known as: DESYREL  Take 50 mg by mouth at bedtime.   triamcinolone cream 0.1 % Commonly known as: KENALOG Apply 1 Application topically as directed.   Vitamin D3 50 MCG (2000 UT) Tabs Take 2,000 Units by mouth daily.          Time coordinating discharge: 39 minutes  Signed:  Kya Mayfield  [facility] 05/28/2023, 12:08 PM

## 2023-05-28 NOTE — Progress Notes (Signed)
 Report given to [facility], nurse at [facility]

## 2023-06-05 ENCOUNTER — Emergency Department (HOSPITAL_COMMUNITY): Payer: Medicare Other

## 2023-06-05 ENCOUNTER — Emergency Department (HOSPITAL_COMMUNITY)
Admission: EM | Admit: 2023-06-05 | Discharge: 2023-06-06 | Disposition: A | Discharge status: Home / Self Care | Payer: Medicare Other | Attending: Emergency Medicine | Admitting: Emergency Medicine

## 2023-06-05 ENCOUNTER — Other Ambulatory Visit: Payer: Self-pay

## 2023-06-05 ENCOUNTER — Encounter (HOSPITAL_COMMUNITY): Payer: Self-pay

## 2023-06-05 DIAGNOSIS — S12100A Unspecified displaced fracture of second cervical vertebra, initial encounter for closed fracture: Secondary | ICD-10-CM | POA: Insufficient documentation

## 2023-06-05 DIAGNOSIS — Z7901 Long term (current) use of anticoagulants: Secondary | ICD-10-CM | POA: Insufficient documentation

## 2023-06-05 DIAGNOSIS — W19XXXA Unspecified fall, initial encounter: Secondary | ICD-10-CM

## 2023-06-05 DIAGNOSIS — I4891 Unspecified atrial fibrillation: Secondary | ICD-10-CM | POA: Insufficient documentation

## 2023-06-05 DIAGNOSIS — Y92129 Unspecified place in nursing home as the place of occurrence of the external cause: Secondary | ICD-10-CM | POA: Diagnosis not present

## 2023-06-05 DIAGNOSIS — W06XXXA Fall from bed, initial encounter: Secondary | ICD-10-CM | POA: Diagnosis not present

## 2023-06-05 DIAGNOSIS — M542 Cervicalgia: Secondary | ICD-10-CM | POA: Diagnosis present

## 2023-06-05 DIAGNOSIS — S0990XA Unspecified injury of head, initial encounter: Secondary | ICD-10-CM | POA: Diagnosis present

## 2023-06-05 LAB — CBC WITH DIFFERENTIAL/PLATELET
Abs Immature Granulocytes: 0.16 10*3/uL — ABNORMAL HIGH (ref 0.00–0.07)
Basophils Absolute: 0 10*3/uL (ref 0.0–0.1)
Basophils Relative: 0 %
Eosinophils Absolute: 0.1 10*3/uL (ref 0.0–0.5)
Eosinophils Relative: 1 %
HCT: 31.7 % — ABNORMAL LOW (ref 39.0–52.0)
Hemoglobin: 10.6 g/dL — ABNORMAL LOW (ref 13.0–17.0)
Immature Granulocytes: 2 %
Lymphocytes Relative: 7 %
Lymphs Abs: 0.7 10*3/uL (ref 0.7–4.0)
MCH: 32.4 pg (ref 26.0–34.0)
MCHC: 33.4 g/dL (ref 30.0–36.0)
MCV: 96.9 fL (ref 80.0–100.0)
Monocytes Absolute: 0.6 10*3/uL (ref 0.1–1.0)
Monocytes Relative: 6 %
Neutro Abs: 8.1 10*3/uL — ABNORMAL HIGH (ref 1.7–7.7)
Neutrophils Relative %: 84 %
Platelets: 159 10*3/uL (ref 150–400)
RBC: 3.27 MIL/uL — ABNORMAL LOW (ref 4.22–5.81)
RDW: 13.6 % (ref 11.5–15.5)
WBC: 9.6 10*3/uL (ref 4.0–10.5)
nRBC: 0 % (ref 0.0–0.2)

## 2023-06-05 LAB — COMPREHENSIVE METABOLIC PANEL
ALT: 65 U/L — ABNORMAL HIGH (ref 0–44)
AST: 56 U/L — ABNORMAL HIGH (ref 15–41)
Albumin: 2.9 g/dL — ABNORMAL LOW (ref 3.5–5.0)
Alkaline Phosphatase: 106 U/L (ref 38–126)
Anion gap: 8 (ref 5–15)
BUN: 17 mg/dL (ref 8–23)
CO2: 26 mmol/L (ref 22–32)
Calcium: 9 mg/dL (ref 8.9–10.3)
Chloride: 100 mmol/L (ref 98–111)
Creatinine, Ser: 0.8 mg/dL (ref 0.61–1.24)
GFR, Estimated: >60 mL/min (ref >60)
Glucose, Bld: 105 mg/dL — ABNORMAL HIGH (ref 70–99)
Potassium: 4.2 mmol/L (ref 3.5–5.1)
Sodium: 134 mmol/L — ABNORMAL LOW (ref 135–145)
Total Bilirubin: 0.4 mg/dL (ref 0.0–1.2)
Total Protein: 5.5 g/dL — ABNORMAL LOW (ref 6.5–8.1)

## 2023-06-05 LAB — CK: Total CK: 166 U/L (ref 49–397)

## 2023-06-05 LAB — I-STAT CG4 LACTIC ACID, ED: Lactic Acid, Venous: 1.3 mmol/L (ref 0.5–1.9)

## 2023-06-05 MED ORDER — IOHEXOL 350 MG/ML SOLN
75.0000 mL | Freq: Once | INTRAVENOUS | Status: AC | PRN
  Administered 2023-06-05: 75 mL via INTRAVENOUS

## 2023-06-05 NOTE — ED Notes (Signed)
Patient back to room and on the monitor.

## 2023-06-05 NOTE — ED Provider Notes (Incomplete)
[place] EMERGENCY DEPARTMENT AT [facility] Provider Note   CSN: [id number] Arrival date & time: 06/05/23  2140     History {Add pertinent medical, surgical, social history, OB history to HPI:1} Chief Complaint  Patient presents with  . Fall    Kenneth Bowen is a 89 y.o. male with PMHx A-fib on Eliquis, heart block with AICD. meyren   Fall       Home Medications Prior to Admission medications   Medication Sig Start Date End Date Taking? Authorizing Provider  ALPRAZolam Prudy Feeler) 0.5 MG tablet Take 0.5 mg by mouth at bedtime. 10/28/20   [provider]  apixaban (ELIQUIS) 2.5 MG TABS tablet Take 1 tablet (2.5 mg total) by mouth 2 (two) times daily. Resume from 06/10/22 Patient taking differently: Take 2.5 mg by mouth 2 (two) times daily. 08/07/22   Marinda Elk, MD  ARTIFICIAL TEAR OP Place 1 drop into both eyes at bedtime as needed (dry eye).    [provider]  Cholecalciferol (VITAMIN D3) 50 MCG (2000 UT) TABS Take 2,000 Units by mouth daily.    [provider]  desoximetasone (TOPICORT) 0.25 % cream Apply 1 Application topically at bedtime. 06/17/19   [provider]  divalproex (DEPAKOTE ER) 500 MG 24 hr tablet TAKE 1 TABLET (500 MG TOTAL) BY MOUTH DAILY. 03/14/23   Ihor Austin, NP  feeding supplement (ENSURE ENLIVE / ENSURE PLUS) LIQD Take 237 mLs by mouth 2 (two) times daily between meals. 05/28/23   Pokhrel, Rebekah Chesterfield, MD  ferrous sulfate 325 (65 FE) MG tablet Take 325 mg by mouth daily with breakfast.    [provider]  folic acid (FOLVITE) 1 MG tablet Take 1 tablet (1 mg total) by mouth daily. 06/10/22   Glade Lloyd, MD  furosemide (LASIX) 40 MG tablet Take 0.5 tablets (20 mg total) by mouth in the morning. 05/28/23   Pokhrel, Rebekah Chesterfield, MD  hydrocerin (EUCERIN) CREA Apply 1 Application topically 2 (two) times daily. Dry skin 05/28/23   Pokhrel, Rebekah Chesterfield, MD  lactulose (CHRONULAC) 10 GM/15ML solution Take 15 mLs  (10 g total) by mouth 2 (two) times daily. Patient taking differently: Take 10 g by mouth daily as needed for mild constipation. 06/09/22   Glade Lloyd, MD  mometasone (ELOCON) 0.1 % ointment Apply 1 application topically 2 (two) times daily as needed (rash).  06/23/19   [provider]  Multiple Vitamin (MULTIVITAMIN) tablet Take 1 tablet by mouth daily.    [provider]  mupirocin ointment (BACTROBAN) 2 % Apply 1 Application topically 2 (two) times daily. 07/09/22   [provider]  omeprazole (PRILOSEC) 20 MG capsule Take 20 mg by mouth daily as needed (for heartburn). 12/29/18   [provider]  tamsulosin (FLOMAX) 0.4 MG CAPS capsule TAKE 1 CAPSULE (0.4 MG TOTAL) BY MOUTH DAILY AFTER SUPPER. Patient taking differently: Take 0.4 mg by mouth daily. 03/21/16   Margaretmary Dys, MD  topiramate (TOPAMAX) 50 MG tablet Take 1 tablet (50 mg total) by mouth at bedtime. 12/27/22   Ihor Austin, NP  traMADol (ULTRAM) 50 MG tablet Take 1 tablet (50 mg total) by mouth every 6 (six) hours as needed for moderate pain (pain score 4-6). 05/28/23   Pokhrel, Rebekah Chesterfield, MD  traZODone (DESYREL) 50 MG tablet Take 50 mg by mouth at bedtime. 12/13/20   [provider]  triamcinolone cream (KENALOG) 0.1 % Apply 1 Application topically as directed. 03/14/23   [provider]  Allergies    Tape and Chlorhexidine    Review of Systems   Review of Systems  Physical Exam Updated Vital Signs BP 131/70   Pulse 89   Temp (!) 93.6 F (34.2 C) (Rectal)   Resp 18   Ht 5\' 10"  (1.778 m)   Wt 80 kg   SpO2 94%   BMI 25.31 kg/m  Physical Exam  ED Results / Procedures / Treatments   Labs (all labs ordered are listed, but only abnormal results are displayed) Labs Reviewed  CBC WITH DIFFERENTIAL/PLATELET - Abnormal; Notable for the following components:      Result Value   RBC 3.27 (*)    Hemoglobin 10.6 (*)    HCT 31.7 (*)    Neutro Abs 8.1 (*)    Abs Immature  Granulocytes 0.16 (*)    All other components within normal limits  COMPREHENSIVE METABOLIC PANEL  CK  I-STAT CG4 LACTIC ACID, ED    EKG None  Radiology No results found.  Procedures Procedures  {Document cardiac monitor, telemetry assessment procedure when appropriate:1}  Medications Ordered in ED Medications - No data to display  ED Course/ Medical Decision Making/ A&P   {   Click here for ABCD2, HEART and other calculatorsREFRESH Note before signing :1}                              Medical Decision Making Amount and/or Complexity of Data Reviewed Labs: ordered. Radiology: ordered.   This patient presents to the ED after a fall, this involves an extensive number of treatment options, and is a complaint that carries with it a high risk of complications and morbidity.  The differential diagnosis includes  intracranial hemorrhage, subdural/epidural hematoma, vertebral fracture, spinal cord injury, muscle strain, skull fracture, fracture.   Co morbidities that complicate the patient evaluation  ***   Additional history obtained:  Additional history obtained from *** External records from outside source obtained and reviewed including ***   Consultations Obtained:  I requested consultation with the Neurosurgeon on-call Dr. Adelene Idler,  and discussed lab and imaging findings as well as pertinent plan - they recommend: outpatient follow-up if CTA is reassuring.   Problem List / ED Course / Critical interventions / Medication management  Patient presented for Fall. Patient with stable vitals and does not appear to be in distress.  I Ordered, and personally interpreted labs.  The pertinent results include:  *** The patient was maintained on a cardiac monitor.  I personally viewed and interpreted the cardiac monitored which showed an underlying rhythm of: *** I ordered imaging studies including *** . I independently visualized and interpreted imaging which showed ***. I  agree with the radiologist interpretation Patient had an unremarkable physical exam and a score of 0 for the Nexus C-spine and Canadian head CT score and so imaging was not obtained at this time. Patient will be encouraged to follow-up with primary care provider to be reevaluated in the next few days.  Using the Canadian CT Score, I did not find it necessary to obtain a head CT due patient's age <65yo and due to lack of blood thinners, seizures, signs of skull fractures, retrograde amnesia, and vomiting. Using Nexus C-spine Score, I did not find it necessary to obtain imaging of C-spine due to lack of focal neurologic deficit, midline spinal tenderness, altered level of consciousness, intoxication, and distracting injury. I have reviewed the patients home medicines and have made  adjustments as needed Patient was given return precautions. Patient stable for discharge at this time. Patient verbalized understanding of plan.   DDx: These are considered less likely due to history of present illness and physical exam findings  Intracranial hemorrhage, subdural/epidural hematoma: Canadian head CT score of 0, no neurodeficits Vertebral fracture: No seatbelt sign, no midline tenderness, no step-off/crepitus/abnormalities palpated Spinal cord injury: Nexus C-spine and Canadian head CT score of 0, no neurodeficits Skull fracture: No postauricular ecchymosis, no periorbital ecchymosis, no hemotympanum Fracture: No step-offs/crepitus/abnormalities palpated in head, neck, chest, upper extremities, lower extremities, pelvis  Risk Stratification Score:  Nexus C-spine: 0 Canadian Head CT: 0   Social Determinants of Health:  Nursing home resident     {Document critical care time when appropriate:1} {Document review of labs and clinical decision tools ie heart score, Chads2Vasc2 etc:1}  {Document your independent review of radiology images, and any outside records:1} {Document your discussion with family  members, caretakers, and with consultants:1} {Document social determinants of health affecting pt's care:1} {Document your decision making why or why not admission, treatments were needed:1} Final Clinical Impression(s) / ED Diagnoses Final diagnoses:  None    Rx / DC Orders ED Discharge Orders     None

## 2023-06-05 NOTE — Progress Notes (Signed)
Orthopedic Tech Progress Note Patient Details:  Kenneth Bowen [birth year] [id number] Responded to level 2 trauma, not needed at this time  Patient ID: Kenneth Bowen, male   DOB: [birth year], 89 y.o.   MRN: [id number]  Kenneth Bowen 06/05/2023, 9:58 PM

## 2023-06-05 NOTE — ED Triage Notes (Signed)
Patient coming from [facility]. Patient had a fall out of bed. Patient was on floor for 30 minutes. Patient complaining of neck pain and has a small laceration to left ear. Patient has swelling and edema to both arms which is baseline for patient. Patient is on eliquis.

## 2023-06-05 NOTE — ED Provider Notes (Signed)
I provided a substantive portion of the care of this patient.  I personally made/approved the management plan for this patient and take responsibility for the patient management.     Patient takes daily Eliquis.  He fell from his bed at [facility].  Reportedly he landed on his head and was not immediately found, estimated 30 minutes on the floor.  Patient does endorse pain in the back of his neck.  He is awake.  He will follow commands.  No facial trauma.  He does have a hematoma or small laceration to the left ear.  Patient is in cervical collar stabilization.  He appropriately follows commands to perform grip strength bilateral upper extremities.  He also follows commands for dorsiflexion plantarflexion bilateral lower extremities.  CT cervical spine shows comminuted nondisplaced fracture of the dens at the base of the odontoid process extending into the C2 vertebral body and C2 lateral mass.  No acute intracranial bleeding.  Currently patient does have neurologic function with following commands for upper and lower extremity strength testing.  PA-C Meridith to consult neurosurgery.    Arby Barrette, MD 06/05/23 2252

## 2023-06-05 NOTE — ED Notes (Signed)
Patient returned from CT

## 2023-06-05 NOTE — ED Notes (Signed)
Patient taken to CT by this RN on the monitor

## 2023-06-05 NOTE — ED Notes (Signed)
Patient transported to CT with TCRN on the monitor

## 2023-06-05 NOTE — ED Provider Notes (Signed)
[place] EMERGENCY DEPARTMENT AT [facility] Provider Note   CSN: [id number] Arrival date & time: 06/05/23  2140     History  Chief Complaint  Patient presents with   Kenneth Bowen is a 89 y.o. male with PMHx A-fib on Eliquis, heart block with AICD who presents to ED after slipping out of bed and landing on his head. Patient denies LOC, seizures, denies any recent infectious symptoms. Denies numbness/paresthesias of his extremities. I was able to talk with patient's nurse at [facility] who also states that patient has not had any infectious symptoms recently. Apparently, patient was on the ground for 30 min before the nursing staff found him.   Fall       Home Medications Prior to Admission medications   Medication Sig Start Date End Date Taking? Authorizing Provider  ALPRAZolam Prudy Feeler) 0.5 MG tablet Take 0.5 mg by mouth at bedtime. 10/28/20   [provider]  apixaban (ELIQUIS) 2.5 MG TABS tablet Take 1 tablet (2.5 mg total) by mouth 2 (two) times daily. Resume from 06/10/22 Patient taking differently: Take 2.5 mg by mouth 2 (two) times daily. 08/07/22   Marinda Elk, MD  ARTIFICIAL TEAR OP Place 1 drop into both eyes at bedtime as needed (dry eye).    [provider]  Cholecalciferol (VITAMIN D3) 50 MCG (2000 UT) TABS Take 2,000 Units by mouth daily.    [provider]  desoximetasone (TOPICORT) 0.25 % cream Apply 1 Application topically at bedtime. 06/17/19   [provider]  divalproex (DEPAKOTE ER) 500 MG 24 hr tablet TAKE 1 TABLET (500 MG TOTAL) BY MOUTH DAILY. 03/14/23   Ihor Austin, NP  feeding supplement (ENSURE ENLIVE / ENSURE PLUS) LIQD Take 237 mLs by mouth 2 (two) times daily between meals. 05/28/23   Pokhrel, Rebekah Chesterfield, MD  ferrous sulfate 325 (65 FE) MG tablet Take 325 mg by mouth daily with breakfast.    [provider]  folic acid (FOLVITE) 1 MG tablet Take 1 tablet (1 mg total) by mouth  daily. 06/10/22   Glade Lloyd, MD  furosemide (LASIX) 40 MG tablet Take 0.5 tablets (20 mg total) by mouth in the morning. 05/28/23   Pokhrel, Rebekah Chesterfield, MD  hydrocerin (EUCERIN) CREA Apply 1 Application topically 2 (two) times daily. Dry skin 05/28/23   Pokhrel, Rebekah Chesterfield, MD  lactulose (CHRONULAC) 10 GM/15ML solution Take 15 mLs (10 g total) by mouth 2 (two) times daily. Patient taking differently: Take 10 g by mouth daily as needed for mild constipation. 06/09/22   Glade Lloyd, MD  mometasone (ELOCON) 0.1 % ointment Apply 1 application topically 2 (two) times daily as needed (rash).  06/23/19   [provider]  Multiple Vitamin (MULTIVITAMIN) tablet Take 1 tablet by mouth daily.    [provider]  mupirocin ointment (BACTROBAN) 2 % Apply 1 Application topically 2 (two) times daily. 07/09/22   [provider]  omeprazole (PRILOSEC) 20 MG capsule Take 20 mg by mouth daily as needed (for heartburn). 12/29/18   [provider]  tamsulosin (FLOMAX) 0.4 MG CAPS capsule TAKE 1 CAPSULE (0.4 MG TOTAL) BY MOUTH DAILY AFTER SUPPER. Patient taking differently: Take 0.4 mg by mouth daily. 03/21/16   Margaretmary Dys, MD  topiramate (TOPAMAX) 50 MG tablet Take 1 tablet (50 mg total) by mouth at bedtime. 12/27/22   Ihor Austin, NP  traMADol (ULTRAM) 50 MG tablet Take 1 tablet (50 mg total) by mouth every 6 (six)  hours as needed for moderate pain (pain score 4-6). 05/28/23   Pokhrel, Rebekah Chesterfield, MD  traZODone (DESYREL) 50 MG tablet Take 50 mg by mouth at bedtime. 12/13/20   [provider]  triamcinolone cream (KENALOG) 0.1 % Apply 1 Application topically as directed. 03/14/23   [provider]      Allergies    Tape and Chlorhexidine    Review of Systems   Review of Systems  Musculoskeletal:        Fall    Physical Exam Updated Vital Signs BP 131/70   Pulse 89   Temp (!) 93.6 F (34.2 C) (Rectal)   Resp 18   Ht 5\' 10"  (1.778 m)   Wt 80 kg   SpO2 94%    BMI 25.31 kg/m  Physical Exam Vitals and nursing note reviewed.  Constitutional:      General: He is not in acute distress.    Appearance: He is not ill-appearing or toxic-appearing.  HENT:     Head: Normocephalic and atraumatic.     Comments: C-collar in place    Mouth/Throat:     Mouth: Mucous membranes are moist.  Eyes:     General: No scleral icterus.       Right eye: No discharge.        Left eye: No discharge.     Extraocular Movements: Extraocular movements intact.     Conjunctiva/sclera: Conjunctivae normal.     Pupils: Pupils are equal, round, and reactive to light.  Cardiovascular:     Rate and Rhythm: Normal rate and regular rhythm.     Pulses: Normal pulses.     Heart sounds: Normal heart sounds. No murmur heard. Pulmonary:     Effort: Pulmonary effort is normal. No respiratory distress.     Breath sounds: Normal breath sounds. No wheezing, rhonchi or rales.  Abdominal:     General: Abdomen is flat. Bowel sounds are normal. There is no distension.     Palpations: Abdomen is soft. There is no mass.     Tenderness: There is no abdominal tenderness.  Musculoskeletal:     Right lower leg: No edema.     Left lower leg: No edema.     Comments: No tenderness of shoulder, elbows, wrists, hips, knees, ankles bilaterally. +2 radial pulse. +2 pedal pulse. Full active ROM of BL LE and UE. Patellar reflexes intact.  Brisk capillary refill of BL hands.    Skin:    General: Skin is warm and dry.     Findings: No rash.  Neurological:     General: No focal deficit present.     Mental Status: He is alert and oriented to person, place, and time. Mental status is at baseline.     Comments: GCS 15. Speech is goal oriented. No deficits appreciated to CN III-XII; symmetric eyebrow raise, no facial drooping, tongue midline. Patient has equal grip strength bilaterally with 5/5 strength against resistance in all major muscle groups bilaterally. Sensation to light touch intact. Patient  moves extremities without ataxia.   Psychiatric:        Mood and Affect: Mood normal.        Behavior: Behavior normal.     ED Results / Procedures / Treatments   Labs (all labs ordered are listed, but only abnormal results are displayed) Labs Reviewed  CBC WITH DIFFERENTIAL/PLATELET - Abnormal; Notable for the following components:      Result Value   RBC 3.27 (*)    Hemoglobin 10.6 (*)  HCT 31.7 (*)    Neutro Abs 8.1 (*)    Abs Immature Granulocytes 0.16 (*)    All other components within normal limits  COMPREHENSIVE METABOLIC PANEL  CK  I-STAT CG4 LACTIC ACID, ED    EKG None  Radiology No results found.  Procedures Procedures    Medications Ordered in ED Medications - No data to display  ED Course/ Medical Decision Making/ A&P                                 Medical Decision Making Amount and/or Complexity of Data Reviewed Labs: ordered. Radiology: ordered.   This patient presents to the ED after a fall, this involves an extensive number of treatment options, and is a complaint that carries with it a high risk of complications and morbidity.  The differential diagnosis includes  intracranial hemorrhage, subdural/epidural hematoma, vertebral fracture, spinal cord injury, muscle strain, skull fracture, fracture.   Co morbidities that complicate the patient evaluation   A-fib on Eliquis, heart block with AICD   Additional history obtained:  Dr. Thornell Mule PCP   Consultations Obtained:  I requested consultation with the Neurosurgeon on-call Dr. Adelene Idler,  and discussed lab and imaging findings as well as pertinent plan - they recommend: outpatient follow-up if CTA is reassuring.   Problem List / ED Course / Critical interventions / Medication management  Patient presented for Fall. Patient on blood thinners and arrives in C-collar. Patient with stable vitals and does not appear to be in distress. Patient only complaining of head and neck pain.  Physical/neuro exam unremarkable. I Ordered, and personally interpreted labs.  CBC with mild anemia with hemoglobin of 10.6.  No leukocytosis.  CMP with mild hyponatremia 134.  AST/ALT mildly elevated at 56/65.  CK within normal limits.  LA within normal limits. Pelvis x-ray without acute abnormalities.  Chest x-ray without acute abnormalities.  CT head without acute intracranial abnormalities. CT cervical spine showing Comminuted nondisplaced fracture of the dens at the base of the odontoid process with fracture lines extending into the C2 vertebral body and C2 lateral masses (mixed type II/III). Fracture lines extend into the right transverse process. CTA showing Compression of the distal right vertebral artery at the V2/V3 junction at the level of the right C2 transverse foramen due to extrinsic compression from adjacent fracture fragments. Associated subtle intimal irregularity suspicious for associated vascular injury. Resultant short-segment moderate to severe stenosis at this level. Consulted with Nuerosurgeon Meyran after CT cervical spine/head who recommended outpatient follow up. I have a low suspicion for central cord syndrome at this time as patient's initial and repeat neuro exam (2hrs later) were reassuring.   Social Determinants of Health:  Nursing home resident   12:36 AM Care of ASHVIN ADELSON transferred to Dr. Preston Fleeting at the end of my shift as the patient will require reassessment once labs/imaging have resulted. Patient presentation, ED course, and plan of care discussed with review of all pertinent labs and imaging. Please see his/her note for further details regarding further ED course and disposition. Plan at time of handoff is discuss CTA results with neurosurgeon as this is a new result. This may be altered or completely changed at the discretion of the oncoming team pending results of further workup.    This note has been dictated using Teaching laboratory technician.  Unfortunately, this method of dictation can sometimes lead to typographical or grammatical errors. I apologize  for your inconvenience in advance if this occurs. Please do not hesitate to reach out to me if clarification is needed.          Final Clinical Impression(s) / ED Diagnoses Final diagnoses:  None    Rx / DC Orders ED Discharge Orders     None         Dorthy Cooler, [place] 06/06/23 0039    Arby Barrette, MD 06/09/23 1014

## 2023-06-05 NOTE — ED Notes (Signed)
Trauma Response Nurse Documentation   Kenneth Bowen is a 89 y.o. male arriving to [facility] ED via EMS  On Eliquis (apixaban) daily. Trauma was activated as a Level 2 by ED charge RN based on the following trauma criteria Elderly patients > 65 with head trauma on anti-coagulation (excluding ASA).  Patient cleared for CT by S.Meredith PA-C. Pt transported to CT with trauma response nurse present to monitor. RN remained with the patient throughout their absence from the department for clinical observation.   GCS 15.   History   Past Medical History:  Diagnosis Date   Bradycardia 06/02/2022   Symptomatic bradycardia now status post pacemaker after syncope.   Colon polyps    Diverticulosis    Encephalopathy acute 06/02/2022   Hypothermia 06/02/2022   Pacemaker 05/2022   Due to complete heart block   Prostate cancer [facility]) 2015   radiation finished Nov 2015   Seizures (HCC) 07/2016   Sepsis (HCC) 06/02/2022   Unresponsiveness 07/28/2016     Past Surgical History:  Procedure Laterality Date   colonscopy     INGUINAL HERNIA REPAIR     KNEE SURGERY     PACEMAKER IMPLANT N/A 06/04/2022   Procedure: PACEMAKER IMPLANT;  Surgeon: Marinus Maw, MD;  Location: [facility] INVASIVE CV LAB;  Service: Cardiovascular;  Laterality: N/A;   PROSTATE BIOPSY     TEMPORARY PACEMAKER N/A 06/03/2022   Procedure: TEMPORARY PACEMAKER;  Surgeon: Dolores Patty, MD;  Location: [facility] INVASIVE CV LAB;  Service: Cardiovascular;  Laterality: N/A;       Initial Focused Assessment (If applicable, or please see trauma documentation): Alert/oriented male presents from SNF with head injury after a fall out of bed. C/o neck pain and abrasion to left ear.   Airway patent/unobstructed, BS clear Bleeding from ear abrasion controlled GCS 15 PERRLA 2  CT's Completed:   CT Head and CT C-Spine   Interventions:  IV start and trauma lab draw Portable chest and pelvis XRAY CT head and c-spine [place] J collar to  replace EMS collar Bair hugger  Plan for disposition:  Admit  Consults completed:  Neurosurgery Cram at 2249  Event Summary: Presents via EMS from SNF after an unwitnessed fall from bed. C/o neck pain and left ear abrasion, bleeding controlled. Changed EMS collar to [facility] J. Pt cool to the touch, temporal temp reading 95 so rectal temp obtained - 93.6 F rectal, placed on bair hugger. XRAYS completed, then escorted to CT.  MTP Summary (If applicable): NA  Bedside handoff with ED RN D'Erika.    Kristoff Coonradt O Kymoni Lesperance  Trauma Response RN  Please call TRN at [phone] for further assistance.

## 2023-06-06 NOTE — Discharge Instructions (Addendum)
It is very important that you wear your collar at all times.  If you take it off, you could injure your spinal cord, which could lead you paralyzed.  There may be an injury to one of the arteries that goes through your neck.  You need to follow-up with the neurosurgeon in 2 weeks.

## 2023-06-06 NOTE — ED Provider Notes (Signed)
Care assumed from Dr. Donnald Garre, patient with C2 fracture, CTA showing compression of right vertebral artery with intimal irregularity. Neurosurgery is being reconsulted regarding this.  I have discussed the case with Julien Girt, FNP, on-call for neurosurgery.  She has reviewed the images and states that patient is still safe for discharge but will need to follow-up with Dr. Conchita Paris in 2 weeks.  He is to maintain cervical immobilization by wearing his stiff cervical collar at all times.     Dione Booze, MD 06/06/23 [id number]

## 2023-06-06 NOTE — ED Notes (Signed)
[facility] called for transport back to [facility]. 

## 2023-06-12 ENCOUNTER — Telehealth: Payer: Self-pay | Admitting: Neurology

## 2023-06-12 NOTE — Telephone Encounter (Signed)
LVM and sent mychart msg informing pt of need to reschedule 07/15/23 appt - MD out

## 2023-06-20 ENCOUNTER — Emergency Department (HOSPITAL_COMMUNITY): Payer: Medicare Other

## 2023-06-20 ENCOUNTER — Encounter (HOSPITAL_COMMUNITY): Payer: Self-pay

## 2023-06-20 ENCOUNTER — Other Ambulatory Visit: Payer: Self-pay

## 2023-06-20 ENCOUNTER — Inpatient Hospital Stay (HOSPITAL_COMMUNITY)
Admission: EM | Admit: 2023-06-20 | Discharge: 2023-07-13 | DRG: 871 | Disposition: E | Payer: Medicare Other | Attending: Family Medicine | Admitting: Family Medicine

## 2023-06-20 DIAGNOSIS — A419 Sepsis, unspecified organism: Secondary | ICD-10-CM | POA: Diagnosis present

## 2023-06-20 DIAGNOSIS — S12190A Other displaced fracture of second cervical vertebra, initial encounter for closed fracture: Secondary | ICD-10-CM | POA: Diagnosis present

## 2023-06-20 DIAGNOSIS — J690 Pneumonitis due to inhalation of food and vomit: Secondary | ICD-10-CM | POA: Diagnosis present

## 2023-06-20 DIAGNOSIS — R6521 Severe sepsis with septic shock: Secondary | ICD-10-CM | POA: Diagnosis present

## 2023-06-20 DIAGNOSIS — Z66 Do not resuscitate: Secondary | ICD-10-CM | POA: Diagnosis present

## 2023-06-20 DIAGNOSIS — Z1152 Encounter for screening for COVID-19: Secondary | ICD-10-CM | POA: Diagnosis not present

## 2023-06-20 DIAGNOSIS — G40909 Epilepsy, unspecified, not intractable, without status epilepticus: Secondary | ICD-10-CM | POA: Diagnosis present

## 2023-06-20 DIAGNOSIS — Z7901 Long term (current) use of anticoagulants: Secondary | ICD-10-CM | POA: Diagnosis not present

## 2023-06-20 DIAGNOSIS — Z8546 Personal history of malignant neoplasm of prostate: Secondary | ICD-10-CM | POA: Diagnosis not present

## 2023-06-20 DIAGNOSIS — J9601 Acute respiratory failure with hypoxia: Secondary | ICD-10-CM | POA: Diagnosis present

## 2023-06-20 DIAGNOSIS — Z811 Family history of alcohol abuse and dependence: Secondary | ICD-10-CM

## 2023-06-20 DIAGNOSIS — R29818 Other symptoms and signs involving the nervous system: Secondary | ICD-10-CM | POA: Diagnosis present

## 2023-06-20 DIAGNOSIS — W06XXXA Fall from bed, initial encounter: Secondary | ICD-10-CM | POA: Diagnosis present

## 2023-06-20 DIAGNOSIS — R680 Hypothermia, not associated with low environmental temperature: Secondary | ICD-10-CM | POA: Diagnosis present

## 2023-06-20 DIAGNOSIS — I4819 Other persistent atrial fibrillation: Secondary | ICD-10-CM | POA: Diagnosis present

## 2023-06-20 DIAGNOSIS — Z950 Presence of cardiac pacemaker: Secondary | ICD-10-CM

## 2023-06-20 DIAGNOSIS — Z86718 Personal history of other venous thrombosis and embolism: Secondary | ICD-10-CM

## 2023-06-20 DIAGNOSIS — S12100A Unspecified displaced fracture of second cervical vertebra, initial encounter for closed fracture: Secondary | ICD-10-CM | POA: Insufficient documentation

## 2023-06-20 DIAGNOSIS — Y92122 Bedroom in nursing home as the place of occurrence of the external cause: Secondary | ICD-10-CM

## 2023-06-20 DIAGNOSIS — Z79899 Other long term (current) drug therapy: Secondary | ICD-10-CM | POA: Diagnosis not present

## 2023-06-20 DIAGNOSIS — I10 Essential (primary) hypertension: Secondary | ICD-10-CM | POA: Diagnosis present

## 2023-06-20 DIAGNOSIS — M545 Low back pain, unspecified: Secondary | ICD-10-CM | POA: Diagnosis present

## 2023-06-20 DIAGNOSIS — J189 Pneumonia, unspecified organism: Secondary | ICD-10-CM

## 2023-06-20 DIAGNOSIS — I442 Atrioventricular block, complete: Secondary | ICD-10-CM | POA: Diagnosis present

## 2023-06-20 DIAGNOSIS — G8929 Other chronic pain: Secondary | ICD-10-CM | POA: Diagnosis present

## 2023-06-20 DIAGNOSIS — R739 Hyperglycemia, unspecified: Secondary | ICD-10-CM | POA: Diagnosis present

## 2023-06-20 DIAGNOSIS — Z515 Encounter for palliative care: Secondary | ICD-10-CM | POA: Diagnosis not present

## 2023-06-20 DIAGNOSIS — G20C Parkinsonism, unspecified: Secondary | ICD-10-CM | POA: Diagnosis present

## 2023-06-20 DIAGNOSIS — T68XXXA Hypothermia, initial encounter: Secondary | ICD-10-CM | POA: Diagnosis present

## 2023-06-20 LAB — BRAIN NATRIURETIC PEPTIDE: B Natriuretic Peptide: 144.3 pg/mL — ABNORMAL HIGH (ref 0.0–100.0)

## 2023-06-20 LAB — BLOOD GAS, VENOUS
Acid-Base Excess: 4.5 mmol/L — ABNORMAL HIGH (ref 0.0–2.0)
Bicarbonate: 30.8 mmol/L — ABNORMAL HIGH (ref 20.0–28.0)
O2 Saturation: 87 %
Patient temperature: 37
pCO2, Ven: 52 mm[Hg] (ref 44–60)
pH, Ven: 7.38 (ref 7.25–7.43)
pO2, Ven: 52 mm[Hg] — ABNORMAL HIGH (ref 32–45)

## 2023-06-20 LAB — COMPREHENSIVE METABOLIC PANEL
ALT: 33 U/L (ref 0–44)
AST: 34 U/L (ref 15–41)
Albumin: 3.3 g/dL — ABNORMAL LOW (ref 3.5–5.0)
Alkaline Phosphatase: 128 U/L — ABNORMAL HIGH (ref 38–126)
Anion gap: 12 (ref 5–15)
BUN: 19 mg/dL (ref 8–23)
CO2: 28 mmol/L (ref 22–32)
Calcium: 9.7 mg/dL (ref 8.9–10.3)
Chloride: 95 mmol/L — ABNORMAL LOW (ref 98–111)
Creatinine, Ser: 0.66 mg/dL (ref 0.61–1.24)
GFR, Estimated: >60 mL/min (ref >60)
Glucose, Bld: 152 mg/dL — ABNORMAL HIGH (ref 70–99)
Potassium: 4 mmol/L (ref 3.5–5.1)
Sodium: 135 mmol/L (ref 135–145)
Total Bilirubin: 0.8 mg/dL (ref 0.0–1.2)
Total Protein: 7.1 g/dL (ref 6.5–8.1)

## 2023-06-20 LAB — CBG MONITORING, ED: Glucose-Capillary: 147 mg/dL — ABNORMAL HIGH (ref 70–99)

## 2023-06-20 LAB — CBC WITH DIFFERENTIAL/PLATELET
Abs Immature Granulocytes: 0.03 10*3/uL (ref 0.00–0.07)
Basophils Absolute: 0 10*3/uL (ref 0.0–0.1)
Basophils Relative: 0 %
Eosinophils Absolute: 0 10*3/uL (ref 0.0–0.5)
Eosinophils Relative: 0 %
HCT: 37.2 % — ABNORMAL LOW (ref 39.0–52.0)
Hemoglobin: 12.5 g/dL — ABNORMAL LOW (ref 13.0–17.0)
Immature Granulocytes: 0 %
Lymphocytes Relative: 3 %
Lymphs Abs: 0.4 10*3/uL — ABNORMAL LOW (ref 0.7–4.0)
MCH: 32.6 pg (ref 26.0–34.0)
MCHC: 33.6 g/dL (ref 30.0–36.0)
MCV: 97.1 fL (ref 80.0–100.0)
Monocytes Absolute: 0.5 10*3/uL (ref 0.1–1.0)
Monocytes Relative: 5 %
Neutro Abs: 10 10*3/uL — ABNORMAL HIGH (ref 1.7–7.7)
Neutrophils Relative %: 92 %
Platelets: 175 10*3/uL (ref 150–400)
RBC: 3.83 MIL/uL — ABNORMAL LOW (ref 4.22–5.81)
RDW: 14 % (ref 11.5–15.5)
WBC: 11 10*3/uL — ABNORMAL HIGH (ref 4.0–10.5)
nRBC: 0 % (ref 0.0–0.2)

## 2023-06-20 LAB — GLUCOSE, CAPILLARY: Glucose-Capillary: 97 mg/dL (ref 70–99)

## 2023-06-20 LAB — PROTIME-INR
INR: 1.4 — ABNORMAL HIGH (ref 0.8–1.2)
Prothrombin Time: 17.8 s — ABNORMAL HIGH (ref 11.4–15.2)

## 2023-06-20 LAB — I-STAT CG4 LACTIC ACID, ED
Lactic Acid, Venous: 1.9 mmol/L (ref 0.5–1.9)
Lactic Acid, Venous: 3.9 mmol/L (ref 0.5–1.9)

## 2023-06-20 LAB — RESP PANEL BY RT-PCR (RSV, FLU A&B, COVID)  RVPGX2
Influenza A by PCR: NEGATIVE
Influenza B by PCR: NEGATIVE
Resp Syncytial Virus by PCR: NEGATIVE
SARS Coronavirus 2 by RT PCR: NEGATIVE

## 2023-06-20 LAB — APTT: aPTT: 39 s — ABNORMAL HIGH (ref 24–36)

## 2023-06-20 LAB — TROPONIN I (HIGH SENSITIVITY)
Troponin I (High Sensitivity): 57 ng/L — ABNORMAL HIGH (ref <18)
Troponin I (High Sensitivity): 50 ng/L — ABNORMAL HIGH (ref <18)

## 2023-06-20 MED ORDER — LACTATED RINGERS IV BOLUS
500.0000 mL | Freq: Once | INTRAVENOUS | Status: AC
  Administered 2023-06-20: 500 mL via INTRAVENOUS

## 2023-06-20 MED ORDER — MORPHINE SULFATE (PF) 4 MG/ML IV SOLN: 4.0000 mg | INTRAVENOUS | Status: DC | PRN

## 2023-06-20 MED ORDER — SODIUM CHLORIDE 0.9 % IV BOLUS
1000.0000 mL | Freq: Once | INTRAVENOUS | Status: AC
  Administered 2023-06-20: 1000 mL via INTRAVENOUS

## 2023-06-20 MED ORDER — SODIUM CHLORIDE 0.9 % IV SOLN
500.0000 mg | Freq: Once | INTRAVENOUS | Status: AC
  Administered 2023-06-20: 500 mg via INTRAVENOUS
  Filled 2023-06-20: qty 5

## 2023-06-20 MED ORDER — MORPHINE SULFATE (PF) 2 MG/ML IV SOLN
1.0000 mg | INTRAVENOUS | Status: DC | PRN
  Administered 2023-06-20: 1 mg via INTRAVENOUS
  Filled 2023-06-20: qty 1

## 2023-06-20 MED ORDER — MORPHINE SULFATE (PF) 2 MG/ML IV SOLN
2.0000 mg | INTRAVENOUS | Status: DC | PRN
  Administered 2023-06-20 – 2023-06-21 (×2): 2 mg via INTRAVENOUS
  Administered 2023-06-21: 4 mg via INTRAVENOUS
  Filled 2023-06-20: qty 1
  Filled 2023-06-20 (×2): qty 2

## 2023-06-20 MED ORDER — VANCOMYCIN HCL 1750 MG/350ML IV SOLN
1750.0000 mg | Freq: Once | INTRAVENOUS | Status: AC
  Administered 2023-06-20: 1750 mg via INTRAVENOUS
  Filled 2023-06-20: qty 350

## 2023-06-20 MED ORDER — VANCOMYCIN HCL 1500 MG/300ML IV SOLN: 1500.0000 mg | INTRAVENOUS | Status: DC

## 2023-06-20 MED ORDER — METHYLPREDNISOLONE SODIUM SUCC 125 MG IJ SOLR
125.0000 mg | Freq: Once | INTRAMUSCULAR | Status: AC
  Administered 2023-06-20: 125 mg via INTRAVENOUS
  Filled 2023-06-20: qty 2

## 2023-06-20 MED ORDER — SODIUM CHLORIDE 0.9 % IV SOLN
1.0000 g | Freq: Once | INTRAVENOUS | Status: AC
  Administered 2023-06-20: 1 g via INTRAVENOUS
  Filled 2023-06-20: qty 10

## 2023-06-20 MED ORDER — HYDROMORPHONE HCL 1 MG/ML IJ SOLN
0.5000 mg | INTRAMUSCULAR | Status: DC | PRN
  Administered 2023-06-20: 0.5 mg via INTRAVENOUS
  Filled 2023-06-20: qty 1

## 2023-06-20 MED ORDER — SODIUM CHLORIDE 0.9 % IV SOLN
2.0000 g | Freq: Three times a day (TID) | INTRAVENOUS | Status: DC
  Administered 2023-06-20: 2 g via INTRAVENOUS
  Filled 2023-06-20: qty 12.5

## 2023-06-20 MED ORDER — ALBUTEROL SULFATE (2.5 MG/3ML) 0.083% IN NEBU
2.5000 mg | INHALATION_SOLUTION | RESPIRATORY_TRACT | Status: DC
  Administered 2023-06-20 – 2023-06-21 (×4): 2.5 mg via RESPIRATORY_TRACT
  Filled 2023-06-20 (×4): qty 3

## 2023-06-20 MED ORDER — LACTATED RINGERS IV SOLN: INTRAVENOUS | Status: DC

## 2023-06-20 NOTE — ED Notes (Signed)
 Pt desatting on 6L [place]  respiratory at bedside placed on non rebreather mask at 15L rebounded to 91%

## 2023-06-20 NOTE — H&P (Signed)
 History and Physical    Patient: Kenneth Bowen FMW:[id number] DOB: 03-Oct-1930 DOA: 06/20/2023 DOS: the patient was seen and examined on 06/20/2023 PCP: Valentin Skates, DO  Patient coming from:  Acute rehab  Chief Complaint:  Chief Complaint  Patient presents with   Shortness of Breath       HPI:  89 y.o. M with hx CHB s/p PPM, Afib on Eliquis , hx DVT, hx Parkinsonism, seizures, chronic pain, and recent admission for hypothermia unclear cause and more recent C2 cervical spine fracture who presented with hypoxia, hypothermia.  Patient was admitted 1/7 to 1/14 for hypothermia/hypotension, treated with empiric antibiotics 48 hours, blood and urine cultures negative, CXR clear, so Abx held at 48 hours.    He remained hypotensive and so was started on midodrine , TSH and ACTH  stim test normal range.  Discharged on new midodrine  to SNF.  Clemens a week later in SNF, acquired C2 fracture, now in stiff collar, follow up with NSGY recommended outpatient in 2 weeks, still pending.  Now in the last week was fine, until this morning, staff found him hypoxic and tachpyneic  In the ER, he was coarse, tacheypnic to the 30s, hypoxic requiring 15L HFNC and had copious tan secretions suctioned from the throat.   Hypothermic again, hypotensive to the 80s and 90s.  CXR clear, Cr normal, WBC normal, lactate normal, VBG unremarkable (on supplemental O2).  Given fluids, empiric antibiotics and hospitalist service asked to evaluate.        Review of Systems  Unable to perform ROS: Acuity of condition     Past Medical History:  Diagnosis Date   Bradycardia 06/02/2022   Symptomatic bradycardia now status post pacemaker after syncope.   Colon polyps    Diverticulosis    Encephalopathy acute 06/02/2022   Hypothermia 06/02/2022   Pacemaker 05/2022   Due to complete heart block   Prostate cancer [facility]) 2015   radiation finished Nov 2015   Seizures (HCC) 07/2016   Sepsis (HCC) 06/02/2022    Unresponsiveness 07/28/2016   Past Surgical History:  Procedure Laterality Date   colonscopy     INGUINAL HERNIA REPAIR     KNEE SURGERY     PACEMAKER IMPLANT N/A 06/04/2022   Procedure: PACEMAKER IMPLANT;  Surgeon: Waddell Danelle ORN, MD;  Location: [facility] INV[facility];  Service: Cardiovascular;  Laterality: N/A;   PROSTATE BIOPSY     TEMPORARY PACEMAKER N/A 06/03/2022   Procedure: TEMPORARY PACEMAKER;  Surgeon: Cherrie Toribio SAUNDERS, MD;  Location: [facility] INV[facility];  Service: Cardiovascular;  Laterality: N/A;   Social History:  reports that he has never smoked. He has never used smokeless tobacco. He reports that he does not currently use alcohol after a past usage of about 7.0 standard drinks of alcohol per week. He reports that he does not use drugs.  Allergies  Allergen Reactions   Tape Other (See Comments)    Skin tearing!! SKIN TEARS AND BRUISES VERY EASILY!!   Chlorhexidine  Rash    Family History  Problem Relation Age of Onset   Alcoholism Father    Cancer Neg Hx    Colon cancer Neg Hx     Prior to Admission medications   Medication Sig Start Date End Date Taking? Authorizing Provider  acetaminophen  (TYLENOL ) 500 MG tablet Take 1,000 mg by mouth 3 (three) times daily.   Yes [provider]  ALPRAZolam  (XANAX ) 0.5 MG tablet Take 0.5 mg by mouth at bedtime. 10/28/20  Yes [provider]  apixaban  (ELIQUIS ) 2.5 MG TABS tablet Take 1 tablet (2.5 mg total) by mouth 2 (two) times daily. Resume from 06/10/22 08/07/22  Yes Odell Celinda Balo, MD  ARTIFICIAL TEAR OP Place 1 drop into both eyes at bedtime as needed (dry eye).   Yes [provider]  Cholecalciferol  (VITAMIN D3) 50 MCG (2000 UT) TABS Take 2,000 Units by mouth daily.   Yes [provider]  desoximetasone (TOPICORT) 0.25 % cream Apply 1 Application topically at bedtime. 06/17/19  Yes [provider]  divalproex  (DEPAKOTE ) 500 MG DR tablet Take 500 mg by mouth daily. 05/28/23  Yes  [provider]  ferrous sulfate  325 (65 FE) MG tablet Take 325 mg by mouth daily with breakfast.   Yes [provider]  folic acid  (FOLVITE ) 1 MG tablet Take 1 tablet (1 mg total) by mouth daily. 06/10/22  Yes Cheryle Page, MD  furosemide  (LASIX ) 40 MG tablet Take 0.5 tablets (20 mg total) by mouth in the morning. Patient taking differently: Take 40 mg by mouth in the morning. 05/28/23  Yes Pokhrel, Laxman, MD  hydrocerin (EUCERIN) CREA Apply 1 Application topically 2 (two) times daily. Dry skin 05/28/23  Yes Pokhrel, Laxman, MD  lactulose  (CHRONULAC ) 10 GM/15ML solution Take 15 mLs (10 g total) by mouth 2 (two) times daily. 06/09/22  Yes Cheryle Page, MD  methocarbamol (ROBAXIN) 500 MG tablet Take 500 mg by mouth 2 (two) times daily.   Yes [provider]  mometasone (ELOCON) 0.1 % cream Apply 1 Application topically 2 (two) times daily as needed (Rash). 05/28/23  Yes [provider]  Multiple Vitamin (MULTIVITAMIN) tablet Take 1 tablet by mouth daily.   Yes [provider]  mupirocin  ointment (BACTROBAN ) 2 % Apply 1 Application topically 2 (two) times daily. 07/09/22  Yes [provider]  omeprazole (PRILOSEC) 20 MG capsule Take 20 mg by mouth daily as needed (for heartburn). 12/29/18  Yes [provider]  oxyCODONE  (OXY IR/ROXICODONE ) 5 MG immediate release tablet Take 2.5 mg by mouth every 6 (six) hours.   Yes [provider]  polyethylene glycol (MIRALAX  / GLYCOLAX ) 17 g packet Take 17 g by mouth daily.   Yes [provider]  tamsulosin  (FLOMAX ) 0.4 MG CAPS capsule TAKE 1 CAPSULE (0.4 MG TOTAL) BY MOUTH DAILY AFTER SUPPER. 03/21/16  Yes Patrcia Cough, MD  topiramate  (TOPAMAX ) 50 MG tablet Take 1 tablet (50 mg total) by mouth at bedtime. 12/27/22  Yes McCue, Harlene, NP  traZODone  (DESYREL ) 50 MG tablet Take 50 mg by mouth at bedtime. 12/13/20  Yes [provider]  triamcinolone cream (KENALOG) 0.1 % Apply 1  Application topically daily. 03/14/23  Yes [provider]  feeding supplement (ENSURE ENLIVE / ENSURE PLUS) LIQD Take 237 mLs by mouth 2 (two) times daily between meals. 05/28/23   Pokhrel, Laxman, MD  midodrine  (PROAMATINE ) 5 MG tablet Take by mouth daily. Patient not taking: Reported on 06/20/2023 05/28/23   [provider]  traMADol  (ULTRAM ) 50 MG tablet Take 1 tablet (50 mg total) by mouth every 6 (six) hours as needed for moderate pain (pain score 4-6). Patient not taking: Reported on 06/20/2023 05/28/23   Sonjia Held, MD    Physical Exam: Vitals:   06/20/23 1228 06/20/23 1615 06/20/23 1640 06/20/23 1647  BP:      Pulse:   92   Resp:   (!) 42   Temp:  (!) 92.6 F (33.7 C)    TempSrc:  Rectal  SpO2: 94%  97% 93%   Elderly adult male, lying in bed, in respiratory distress Sclera anicteric, conjunctiva pink, lids and lashes normal No nasal deformity, discharge, or epistaxis Oropharynx tacky dry, lips dry, dentition in good repair Neck in hard cervical collar Tachycardic, irregular, no murmurs, no pitting edema, JVP not visible due to neck collar Respiratory rate rapid, in distress, grunting, coarse Abdomen soft, no grimace or rigidity, or involuntary guarding Patient is mumbling incoherently, staring, does not follow commands    Data Reviewed: Chest x-ray, personally reviewed, shows small effusion on the left, small lung volumes EKG, personally reviewed, shows sinus tachycardia BMP shows hyperglycemia, otherwise normal electrolytes and renal function BNP 144 only Troponin 57-50, low and flat Lactic acid normal, now worsening White blood cell count 11, hemoglobin 12, platelets normal COVID, flu, RSV negative Blood cultures pending     Assessment and Plan: * Acute respiratory failure with hypoxia (HCC) Septic shock due to pneumonia, likely aspiration Suspect aspiration pneumonia in the setting of C spine fracture.  Unfortunately, despite aggressive RT  and antibiotics and suctioning, patient's respiratory status is dramatically worsening.  Cervical fracture increases the risk of serious complicaitons from intubation, and he had previously made wishes for DNR status clear.  This is a terminal event, at present, no treatments have a reasonable expectation to resolve his respiratory failure.  I have discussed this with son/POA Alm by phone. - Continue antibiotics for now, broaden to vanc/cefepime  - Bronchodilators - Start Solumedrol - Aggressive fluids   Hypothermia Hypotension Unable to take PO.  Given his respiratory failure is terminal, recommend against pressors. - Hold home furosemide , midodrine    Closed fracture of C2 vertebra (HCC) See above  Persistent atrial fibrillation (HCC) Unable to take PO.  Transitioning to comfort measures - Hold apixaban   Parkinsonian features    Chronic lower back pain Unable to take PO - Analgesics in IV form  Complete heart block s/p placement of cardiac pacemaker    Seizure disorder (HCC) Unable to take PO - Hold Topamax , depakote          Advance Care Planning: DNR, confirmed with son Deatrice  Consults: Discussed with critical care, they recommended comfort measures  Family Communication: Called to wife, no answer, called the son Alm, spoke by phone  Severity of Illness: The appropriate patient status for this patient is INPATIENT. Inpatient status is judged to be reasonable and necessary in order to provide the required intensity of service to ensure the patient's safety. The patient's presenting symptoms, physical exam findings, and initial radiographic and laboratory data in the context of their chronic comorbidities is felt to place them at high risk for further clinical deterioration. Furthermore, it is not anticipated that the patient will be medically stable for discharge from the hospital within 2 midnights of admission.   * I certify that at the point of admission it is  my clinical judgment that the patient will require inpatient hospital care spanning beyond 2 midnights from the point of admission due to high intensity of service, high risk for further deterioration and high frequency of surveillance required.*  The patient is critically ill with multi-organ failure.  Critical care was necessary to treat or prevent imminent or life-threatening deterioration of sepsis, respiratory failure, and was exclusive of separately billable procedures and treating other patients. Total critical care time spent by me: 70 minutes Time spent personally by me on obtaining history from patient or surrogate, evaluation of the patient, evaluation of patient's response to treatment,  ordering and review of laboratory studies, ordering and review of radiographic studies, ordering and performing treatments and interventions, and re-evaluation of the patient's condition.    Author: Lonni SHAUNNA Dalton, MD 06/20/2023 4:51 PM  For on call review [email or web address].

## 2023-06-20 NOTE — ED Notes (Signed)
 Pt placed in bear hugger to increase pt temp.

## 2023-06-20 NOTE — Progress Notes (Signed)
 Pt moved to ICU bed from ED. Non-rebreather applied at 15 L/min and bear hugger applied. Respiratory therapist gave breathing treatment and MD and NP were contacted. Pt GCS3. Continued to make patient comfortable. [name] adult son Kenneth Bowen was contacted via phone and updated.

## 2023-06-20 NOTE — Progress Notes (Signed)
 Pharmacy Antibiotic Note  Kenneth Bowen is a 89 y.o. male who presented to the ED from [facility] facility on 06/20/2023 with c/o SOB and cough.  Pharmacy has been consulted to start vancomycin  and cefepime  for sepsis.  Plan: - vancomycin  1750 mg IV x1, then 1500 mg q24h for est AUC 457 - cefepime  2gm IV q8h ___________________________________  Temp (24hrs), Avg:90.9 F (32.7 C), Min:89.1 F (31.7 C), Max:92.6 F (33.7 C)  Recent Labs  Lab 06/20/23 1156 06/20/23 1328 06/20/23 1517  WBC 11.0*  --   --   CREATININE 0.66  --   --   LATICACIDVEN  --  1.9 3.9*    CrCl cannot be calculated (Unknown ideal weight.).    Allergies  Allergen Reactions   Tape Other (See Comments)    Skin tearing!! SKIN TEARS AND BRUISES VERY EASILY!!   Chlorhexidine  Rash    Thank you for allowing pharmacy to be a part of this patient's care.  Kenneth Bowen 06/20/2023 4:28 PM

## 2023-06-20 NOTE — Assessment & Plan Note (Signed)
 Suspect aspiration pneumonia in the setting of C spine fracture.  Unfortunately, despite aggressive RT and antibiotics and suctioning, patient's respiratory status is dramatically worsening.  Cervical fracture increases the risk of serious complicaitons from intubation, and he had previously made wishes for DNR status clear.  This is a terminal event, at present, no treatments have a reasonable expectation to resolve his respiratory failure.  Recommend comfort care.

## 2023-06-20 NOTE — ED Notes (Signed)
 Pt had black emesis, charge and tech suctioned and changed pt.  Removed neck brace to clean, noted avulson on the base of chin

## 2023-06-20 NOTE — Assessment & Plan Note (Addendum)
 Hypotension Unable to take PO.  Given his respiratory failure is terminal, recommend against pressors. - Hold home furosemide , midodrine 

## 2023-06-20 NOTE — ED Notes (Signed)
 Respiratory at bedside placing pt on high flow nasal cannula

## 2023-06-20 NOTE — Assessment & Plan Note (Signed)
 Unable to take PO - Hold Topamax , depakote 

## 2023-06-20 NOTE — Hospital Course (Signed)
 89 y.o. M with hx CHB s/p PPM, Afib on Eliquis , hx DVT, hx Parkinsonism, seizures, chronic pain, and recent admission for hypothermia unclear cause and more recent C2 cervical spine fracture who presented with hypoxia, hypothermia.  Patient was admitted 1/7 to 1/14 for hypothermia/hypotension, treated with empiric antibiotics 48 hours, blood and urine cultures negative, CXR clear, so Abx held at 48 hours.    He remained hypotensive and so was started on midodrine , TSH and ACTH  stim test normal range.  Discharged on new midodrine  to SNF.  Clemens a week later in SNF, acquired C2 fracture, now in stiff collar, follow up with NSGY recommended outpatient in 2 weeks, still pending.  Now in the last week was fine, until this morning, staff found him hypoxic and tachpyneic  In the ER, he was coarse, tacheypnic to the 30s, hypoxic requiring 15L HFNC and had copious tan secretions suctioned from the throat.   Hypothermic again, hypotensive to the 80s and 90s.  CXR clear, Cr normal, WBC normal, lactate normal, VBG unremarkable (on supplemental O2).  Given fluids, empiric antibiotics and hospitalist service asked to evaluate.

## 2023-06-20 NOTE — Assessment & Plan Note (Signed)
 Unable to take PO - Analgesics in IV form

## 2023-06-20 NOTE — Assessment & Plan Note (Signed)
 See above

## 2023-06-20 NOTE — ED Provider Notes (Signed)
 [name] [facility]ERGENCY DEPARTMENT AT [facility] Provider Note   CSN: [id number] Arrival date & time: 06/20/23  1120     History  Chief Complaint  Patient presents with   Shortness of Breath    Kenneth Bowen is a 89 y.o. male.  Pt is a 89 y.o. male with medical history significant of bradycardia status post pacemaker, complete heart block, prostate cancer, history of seizure disorder, PAF on eliquis  who is currently at [facility] rehab and is presenting today with congested cough and hypoxia.  They report for the last few days patient has been unwell but this morning when they went to check on him sats were 84% on room air and patient does not usually wear oxygen.  Patient does complain of being short of breath and having a wet cough but otherwise has no other complaints.  He does take Lasix  daily and denies having a fever.  Patient did have a fall out of bed 06/05/23 at which time he had a C2 fracture that he is in a collar for but has had no further falls.  Patient was hospitalized in mid January for hypertension, hypothermia that required several days of midodrine  but had no evidence of acute infectious etiology.  He is not currently on any antibiotics.  The history is provided by the patient, medical records and the EMS personnel.  Shortness of Breath      Home Medications Prior to Admission medications   Medication Sig Start Date End Date Taking? Authorizing Provider  ALPRAZolam  (XANAX ) 0.5 MG tablet Take 0.5 mg by mouth at bedtime. 10/28/20   [provider]  apixaban  (ELIQUIS ) 2.5 MG TABS tablet Take 1 tablet (2.5 mg total) by mouth 2 (two) times daily. Resume from 06/10/22 Patient taking differently: Take 2.5 mg by mouth 2 (two) times daily. 08/07/22   Odell Celinda Balo, MD  ARTIFICIAL TEAR OP Place 1 drop into both eyes at bedtime as needed (dry eye).    [provider]  Cholecalciferol  (VITAMIN D3) 50 MCG (2000 UT) TABS Take 2,000 Units by mouth  daily.    [provider]  desoximetasone (TOPICORT) 0.25 % cream Apply 1 Application topically at bedtime. 06/17/19   [provider]  divalproex  (DEPAKOTE ) 500 MG DR tablet Take by mouth daily. 05/28/23   [provider]  feeding supplement (ENSURE ENLIVE / ENSURE PLUS) LIQD Take 237 mLs by mouth 2 (two) times daily between meals. 05/28/23   Pokhrel, Laxman, MD  ferrous sulfate  325 (65 FE) MG tablet Take 325 mg by mouth daily with breakfast.    [provider]  folic acid  (FOLVITE ) 1 MG tablet Take 1 tablet (1 mg total) by mouth daily. 06/10/22   Cheryle Page, MD  furosemide  (LASIX ) 20 MG tablet Take 20 mg by mouth daily.    [provider]  furosemide  (LASIX ) 40 MG tablet Take 0.5 tablets (20 mg total) by mouth in the morning. 05/28/23   Pokhrel, Laxman, MD  hydrocerin (EUCERIN) CREA Apply 1 Application topically 2 (two) times daily. Dry skin 05/28/23   Pokhrel, Vernal, MD  lactulose  (CHRONULAC ) 10 GM/15ML solution Take 15 mLs (10 g total) by mouth 2 (two) times daily. Patient taking differently: Take 10 g by mouth daily as needed for mild constipation. 06/09/22   Cheryle Page, MD  midodrine  (PROAMATINE ) 5 MG tablet Take by mouth daily. 05/28/23   [provider]  mometasone (ELOCON) 0.1 % cream Apply topically. 05/28/23   [provider]  Multiple Vitamin (MULTIVITAMIN) tablet Take 1 tablet by mouth daily.    [provider]  mupirocin  ointment (BACTROBAN ) 2 % Apply 1 Application topically 2 (two) times daily. 07/09/22   [provider]  omeprazole (PRILOSEC) 20 MG capsule Take 20 mg by mouth daily as needed (for heartburn). 12/29/18   [provider]  tamsulosin  (FLOMAX ) 0.4 MG CAPS capsule TAKE 1 CAPSULE (0.4 MG TOTAL) BY MOUTH DAILY AFTER SUPPER. Patient taking differently: Take 0.4 mg by mouth daily. 03/21/16   Patrcia Cough, MD  topiramate  (TOPAMAX ) 50 MG tablet Take 1 tablet (50 mg total) by mouth at  bedtime. 12/27/22   Whitfield Raisin, NP  traMADol  (ULTRAM ) 50 MG tablet Take 1 tablet (50 mg total) by mouth every 6 (six) hours as needed for moderate pain (pain score 4-6). 05/28/23   Pokhrel, Laxman, MD  traZODone  (DESYREL ) 50 MG tablet Take 50 mg by mouth at bedtime. 12/13/20   [provider]  triamcinolone cream (KENALOG) 0.1 % Apply 1 Application topically as directed. 03/14/23   [provider]      Allergies    Tape and Chlorhexidine     Review of Systems   Review of Systems  Respiratory:  Positive for shortness of breath.     Physical Exam Updated Vital Signs BP 110/77 (BP Location: Right Arm)   Pulse 69   Temp (!) 89.1 F (31.7 C) (Rectal)   Resp (!) 34   SpO2 94%  Physical Exam Vitals and nursing note reviewed.  Constitutional:      General: He is in acute distress.     Appearance: He is well-developed.  HENT:     Head: Normocephalic and atraumatic.     Mouth/Throat:     Mouth: Mucous membranes are dry.  Eyes:     Conjunctiva/sclera: Conjunctivae normal.     Pupils: Pupils are equal, round, and reactive to light.  Neck:     Comments: Currently in c-collar Cardiovascular:     Rate and Rhythm: Normal rate and regular rhythm.     Heart sounds: No murmur heard. Pulmonary:     Effort: Pulmonary effort is normal. Tachypnea present. No respiratory distress.     Breath sounds: Rhonchi present. No wheezing or rales.     Comments: Audible rhonchi in all fields with a wet cough Abdominal:     General: There is no distension.     Palpations: Abdomen is soft.     Tenderness: There is no abdominal tenderness. There is no guarding or rebound.  Musculoskeletal:        General: No tenderness. Normal range of motion.     Cervical back: Normal range of motion and neck supple.     Right lower leg: No tenderness. No edema.     Left lower leg: No tenderness. No edema.  Skin:    General: Skin is warm and dry.     Findings: No erythema or rash.  Neurological:      Mental Status: He is alert and oriented to person, place, and time. Mental status is at baseline.  Psychiatric:        Behavior: Behavior normal.     ED Results / Procedures / Treatments   Labs (all labs ordered are listed, but only abnormal results are displayed) Labs Reviewed  CBC WITH DIFFERENTIAL/PLATELET - Abnormal; Notable for the following components:      Result Value   WBC 11.0 (*)    RBC 3.83 (*)    Hemoglobin 12.5 (*)  HCT 37.2 (*)    Neutro Abs 10.0 (*)    Lymphs Abs 0.4 (*)    All other components within normal limits  BRAIN NATRIURETIC PEPTIDE - Abnormal; Notable for the following components:   B Natriuretic Peptide 144.3 (*)    All other components within normal limits  COMPREHENSIVE METABOLIC PANEL - Abnormal; Notable for the following components:   Chloride 95 (*)    Glucose, Bld 152 (*)    Albumin 3.3 (*)    Alkaline Phosphatase 128 (*)    All other components within normal limits  BLOOD GAS, VENOUS - Abnormal; Notable for the following components:   pO2, Ven 52 (*)    Bicarbonate 30.8 (*)    Acid-Base Excess 4.5 (*)    All other components within normal limits  CBG MONITORING, ED - Abnormal; Notable for the following components:   Glucose-Capillary 147 (*)    All other components within normal limits  TROPONIN I (HIGH SENSITIVITY) - Abnormal; Notable for the following components:   Troponin I (High Sensitivity) 57 (*)    All other components within normal limits  RESP PANEL BY RT-PCR (RSV, FLU A&B, COVID)  RVPGX2  CULTURE, BLOOD (ROUTINE X 2)  CULTURE, BLOOD (ROUTINE X 2)  PROTIME-INR  APTT  URINALYSIS, W/ REFLEX TO CULTURE (INFECTION SUSPECTED)  I-STAT CG4 LACTIC ACID, ED  TROPONIN I (HIGH SENSITIVITY)    EKG EKG Interpretation Date/Time:  Thursday June 20 2023 11:29:24 EST Ventricular Rate:  62 PR Interval:  174 QRS Duration:  137 QT Interval:  491 QTC Calculation: 499 R Axis:   -44  Text Interpretation: Atrial-sensed  ventricular-paced rhythm Atrial premature complexes Ventricular preexcitation(WPW) Confirmed by Doretha Folks ([id number]) on 06/20/2023 12:51:36 PM  Radiology DG Chest Port 1 View Result Date: 06/20/2023 CLINICAL DATA:  Cough and shortness of breath. EXAM: PORTABLE CHEST 1 VIEW COMPARISON:  Chest x-ray dated June 05, 2023. FINDINGS: Unchanged left chest wall pacemaker. Stable cardiomediastinal silhouette. Normal pulmonary vascularity. Continued low lung volumes with linear scarring/atelectasis at the left lung base. New blunting of the left costophrenic angle. No consolidation or pneumothorax. No acute osseous abnormality. IMPRESSION: 1. New small left pleural effusion. Electronically Signed   By: Elsie ONEIDA Shoulder M.D.   On: 06/20/2023 12:58    Procedures Procedures    Medications Ordered in ED Medications  lactated ringers  bolus 500 mL (has no administration in time range)  cefTRIAXone  (ROCEPHIN ) 1 g in sodium chloride  0.9 % 100 mL IVPB (has no administration in time range)  azithromycin  (ZITHROMAX ) 500 mg in sodium chloride  0.9 % 250 mL IVPB (has no administration in time range)    ED Course/ Medical Decision Making/ A&P                                 Medical Decision Making Amount and/or Complexity of Data Reviewed Independent Historian: EMS External Data Reviewed: notes. Labs: ordered. Decision-making details documented in ED Course. Radiology: ordered and independent interpretation performed. Decision-making details documented in ED Course. ECG/medicine tests: ordered and independent interpretation performed. Decision-making details documented in ED Course.  Risk Prescription drug management. Decision regarding hospitalization.   Pt with multiple medical problems and comorbidities and presenting today with a complaint that caries a high risk for morbidity and mortality.  Patient currently here today with hypoxia, rhonchi throughout and concern for pneumonia, CHF, viral  process, aspiration pneumonia.  Lower suspicion for acute GI pathology.  Patient  does not have significant distal edema.  Patient is requiring oxygen to remain greater than 90% currently at 5 L.  He does take Eliquis  daily and low suspicion for PE at this time or ACS. X-ray and labs are pending.  1:37 PM Patient found to be hypothermic here at 89.1 and was placed on high flow nasal cannula at 8 L per sats of greater than 90%.  Blood pressure initially was normal but has dropped into the high 80s and low 90s.  Patient was given a fluid bolus.  I independently interpreted patient's labs and EKG.  EKG shows a paced rhythm that is unchanged, lactate within normal limits, CBC with leukocytosis of 11, stable hemoglobin of 12, BNP is unchanged at 144 and CMP without acute findings.  Patient's blood gas shows a normal CO2 and pH, troponin is 57 which is mildly higher than his last 1 which was between 30s and 40s.  Viral swab is negative. I have independently visualized and interpreted pt's images today.  Chest x-ray without acute focal consolidation but radiology reports a new small left pleural effusion.  Patient did have his upper airways suctioned with significant removal of mucous however still requiring oxygen and with discovery of hypothermia will treat for sepsis and patient started on IV antibiotics for respiratory cause.  He was also given a fluid bolus and will reevaluate.  Patient will require admission and hospitalist consulted.  It was confirmed that patient is a DNR/DNI.  He does require limited treatment with IVs and medications.          Final Clinical Impression(s) / ED Diagnoses Final diagnoses:  None    Rx / DC Orders ED Discharge Orders     None         Doretha Folks, MD 06/20/23 1400

## 2023-06-20 NOTE — Progress Notes (Signed)
 [name] eye glasses and cell phone are at bedside

## 2023-06-20 NOTE — Progress Notes (Signed)
   06/20/23 1212  Oxygen Therapy/Pulse Ox  O2 Device (S)  Nasal Cannula (NTS PRN: large, tan, thin amts of secretions, cleared airway. PT stated that he feels nauseous, airway patent, pt is sitting up 90 degree angle.)  O2 Therapy Oxygen  O2 Flow Rate (L/min) 2 L/min  FiO2 (%) 28 %  SpO2 99 %  Safety Instructions Yes (Comment)

## 2023-06-20 NOTE — Progress Notes (Signed)
   06/20/23 1346  Respiratory Assessment  Respiratory Pattern (S)  Dyspnea at rest;Labored  Chest Assessment (S)  Chest expansion symmetrical  Cough (S)  Productive  Sputum Amount (S)  Copious  Sputum Color (S)  Tan  Sputum Consistency (S)  Thin  Sputum Specimen Source (S)  Nasal tracheal  Bilateral Breath Sounds (S)  Fine crackles;Rhonchi  Oxygen Therapy/Pulse Ox  O2 Device (S)  HFNC  O2 Therapy (S)  Oxygen humidified  O2 Flow Rate (L/min) (S)  15 L/min (Increased to 15 L HFNC)  FiO2 (%) (S)  75 %

## 2023-06-20 NOTE — ED Notes (Signed)
 Rectal temp check. RN notified. Pt is back on the barehugger.

## 2023-06-20 NOTE — ED Triage Notes (Signed)
 Pt BIBA from [place] [facility] place for shortness of breath started today and cough x 2 days.

## 2023-06-20 NOTE — Progress Notes (Addendum)
 So far, low dose morphine  and hydromorphone  have not relieved his obvious discomfort.  Discussed with son, there is not a reasonable expectation of reversing the current condition, we will increase morphine  to try to achieve more comfort.

## 2023-06-20 NOTE — Assessment & Plan Note (Signed)
 Unable to take PO.  Transitioning to comfort measures - Hold apixaban 

## 2023-06-20 NOTE — Progress Notes (Signed)
   06/20/23 1228  Oxygen Therapy/Pulse Ox  O2 Device HFNC  O2 Therapy (S)   (Checked safety valve: working properly. NTS: large, tan, thin amts of secretions.)  O2 Flow Rate (L/min) 8 L/min  SpO2 94 %  Safety Instructions Yes (Comment)

## 2023-06-25 LAB — CULTURE, BLOOD (ROUTINE X 2)
Culture: NO GROWTH
Culture: NO GROWTH

## 2023-07-13 NOTE — Progress Notes (Signed)
    Patient Name: Kenneth Bowen           DOB: 07-19-1930  MRN: [id number]       OVERNIGHT EVENT    Notified by RN that patient has expired at 0540.  2 RN verified. Patient was comfort care.   Family is at bedside.    Aniel Hubble, DNP, ACNPC- AG [facility]

## 2023-07-13 NOTE — Progress Notes (Signed)
 Patient picked up from room by funeral home. [employer] home accompanied by lab employee; lab employee provided sheet for patient to be logged. AC notified of patient's departure.

## 2023-07-13 NOTE — Progress Notes (Addendum)
     Patient Name: Kenneth Bowen           DOB: 1931-02-23  MRN: [id number]      Admission Date: 06/20/2023  Attending Provider: Jonel Lonni SQUIBB, *  Primary Diagnosis: Acute respiratory failure with hypoxia (HCC)   Level of care: Stepdown   OVERNIGHT PROGRESS REPORT   Follow up requested by attending--   At bedside, patient is unresponsive.   Labored breathing and tachypnea noted despite being on oxygen.  RN is preparing to give morphine  dose. Bedside RN to report back if no improvement after morphine .      Zabian Swayne, DNP, ACNPC- AG [facility] [place]

## 2023-07-13 NOTE — Death Summary Note (Signed)
 DEATH SUMMARY   Patient Details  Name: Kenneth Bowen MRN: [id number] DOB: Jun 16, 1930 ERE:Dxjxoz, Massie, DO Admission/Discharge Information   Admit Date:  2023/07/12  Date of Death: Date of Death: Jul 13, 2023  Time of Death: Time of Death: 0540  Length of Stay: 1   Principle Cause of death: Aspiration pneumonia  Hospital Diagnoses: Principal Problem:   Acute respiratory failure with hypoxia (HCC) Active Problems:   Hypothermia   Seizure disorder (HCC)   Complete heart block s/p placement of cardiac pacemaker   Chronic lower back pain   Parkinsonian features   Persistent atrial fibrillation (HCC)   Closed fracture of C2 vertebra [facility])   Hospital Course: 89 y.o. M with hx CHB s/p PPM, Afib on Eliquis , hx DVT, hx Parkinsonism, seizures, chronic pain, and recent admission for hypothermia unclear cause and more recent C2 cervical spine fracture who presented with hypoxia, hypothermia.  Patient was admitted 1/7 to 1/14 for hypothermia/hypotension, treated with empiric antibiotics 48 hours, blood and urine cultures negative, CXR clear, so Abx held at 48 hours.    He remained hypotensive and so was started on midodrine , TSH and ACTH  stim test normal range.  Discharged on new midodrine  to SNF.  Clemens a week later in SNF, acquired C2 fracture, now in stiff collar, follow up with NSGY recommended outpatient in 2 weeks, still pending.  Now in the last week was fine, until this morning, staff found him hypoxic and tachpyneic  In the ER, he was coarse, tacheypnic to the 30s, hypoxic requiring 15L HFNC and had copious tan secretions suctioned from the throat.   The patient's hypotension did not respond to fluids, and his hypoxia worsened despite suctioning and high flow nasal cannula.  Given his age, previously stated wishes, and C2 fracture, we recommended against intubation and advanced life support, and son Alm BROTHERS, agreed.  He was able to reach other family whom I could not,  conferred with them and reported they were in agreement.  In light of his worsening hypotension and hypoxia, he was admitted for comfort cares.  He passed peacefully at 5:40AM.        The results of significant diagnostics from this hospitalization (including imaging, microbiology, ancillary and laboratory) are listed below for reference.   Significant Diagnostic Studies: DG Chest Port 1 View Result Date: 07-12-23 CLINICAL DATA:  Cough and shortness of breath. EXAM: PORTABLE CHEST 1 VIEW COMPARISON:  Chest x-ray dated June 05, 2023. FINDINGS: Unchanged left chest wall pacemaker. Stable cardiomediastinal silhouette. Normal pulmonary vascularity. Continued low lung volumes with linear scarring/atelectasis at the left lung base. New blunting of the left costophrenic angle. No consolidation or pneumothorax. No acute osseous abnormality. IMPRESSION: 1. New small left pleural effusion. Electronically Signed   By: Elsie ONEIDA Shoulder M.D.   On: 12-Jul-2023 12:58   CT Angio Neck W and/or Wo Contrast Result Date: 06/06/2023 CLINICAL DATA:  Initial evaluation for acute trauma, cervical spine fracture. EXAM: CT ANGIOGRAPHY NECK TECHNIQUE: Multidetector CT imaging of the neck was performed using the standard protocol during bolus administration of intravenous contrast. Multiplanar CT image reconstructions and MIPs were obtained to evaluate the vascular anatomy. Carotid stenosis measurements (when applicable) are obtained utilizing NASCET criteria, using the distal internal carotid diameter as the denominator. RADIATION DOSE REDUCTION: This exam was performed according to the departmental dose-optimization program which includes automated exposure control, adjustment of the mA and/or kV according to patient size and/or use of iterative reconstruction technique. CONTRAST:  75mL OMNIPAQUE  IOHEXOL  350 MG/ML  SOLN COMPARISON:  Comparison made with prior CT from earlier the same day. FINDINGS: Aortic arch: Visualized  aortic arch within normal limits for caliber with standard branch pattern. Mild aortic atherosclerosis. No stenosis or other acute abnormality about the origin the great vessels. Right carotid system: Right common and internal carotid arteries are patent without dissection. Mild atheromatous change about the right carotid bulb with associated stenosis of up to approximately 50% by NASCET criteria. Left carotid system: Left common and internal carotid arteries are patent without dissection. Mild atheromatous change about the left carotid bulb without hemodynamically significant greater than 50% stenosis. Vertebral arteries: Both vertebral arteries arise from subclavian arteries. No proximal subclavian artery stenosis. Left vertebral artery dominant and patent without stenosis or dissection. Right vertebral artery widely patent proximally. There is compression/narrowing of the distal right vertebral artery at the V2/V3 junction at the level of the right C2 transverse foramen due to extrinsic compression from adjacent fracture fragments (series 7, image 74). Subtle intimal irregularity at this level suspicious for associated vascular injury a (series 13, image 130). No visible raised dissection flap. Resultant short-segment moderate to severe stenosis at this level. Right vertebral artery patent distally to the vertebrobasilar junction without abnormality. Skeleton: Comminuted C2 fracture noted, described on prior CT. Underlying mild cervical spondylosis. Mild chronic height loss at the T2 vertebral body. No worrisome osseous lesions. Other neck: No other acute abnormality within the neck. Upper chest: Mild scattered atelectatic changes noted within the visualized left lung. Visualized upper chest demonstrates no other acute finding. Left-sided pacemaker/AICD in place. Left-sided pacemaker/AICD in place IMPRESSION: 1. Compression of the distal right vertebral artery at the V2/V3 junction at the level of the right C2  transverse foramen due to extrinsic compression from adjacent fracture fragments. Associated subtle intimal irregularity suspicious for associated vascular injury. Resultant short-segment moderate to severe stenosis at this level. 2. No other acute vascular injury within the neck. 3. Atheromatous change about the carotid bifurcations with associated stenosis of up to 50% on the right. 4.  Aortic Atherosclerosis (ICD10-I70.0). Electronically Signed   By: Morene Hoard M.D.   On: 06/06/2023 00:16   CT Head Wo Contrast Result Date: 06/05/2023 CLINICAL DATA:  Clemens out of bed neck pain. Laceration to left ear. Patient is on Eliquis . EXAM: CT HEAD WITHOUT CONTRAST CT CERVICAL SPINE WITHOUT CONTRAST TECHNIQUE: Multidetector CT imaging of the head and cervical spine was performed following the standard protocol without intravenous contrast. Multiplanar CT image reconstructions of the cervical spine were also generated. RADIATION DOSE REDUCTION: This exam was performed according to the departmental dose-optimization program which includes automated exposure control, adjustment of the mA and/or kV according to patient size and/or use of iterative reconstruction technique. COMPARISON:  CT head 03/24/2023 and CT cervical spine 03/24/2023 FINDINGS: CT HEAD FINDINGS Brain: No intracranial hemorrhage, mass effect, or evidence of acute infarct. No hydrocephalus. No extra-axial fluid collection. Age related cerebral atrophy and chronic small vessel ischemic disease. Vascular: No hyperdense vessel. Intracranial arterial calcification. Skull: No fracture or focal lesion. Sinuses/Orbits: No acute finding. Other: None. CT CERVICAL SPINE FINDINGS Alignment: No evidence of traumatic malalignment. Skull base and vertebrae: Comminuted nondisplaced fracture of the dens at the base of the odontoid process with fracture lines extending into the C2 vertebral body and C2 lateral masses bilaterally (mixed type II/III). Fracture lines  extend into the right transverse process. CTA neck is recommended to exclude vascular injury. Soft tissues and spinal canal: Mild prevertebral swelling at C2. No definite canal  hematoma. Disc levels: Multilevel spondylosis, disc space height loss, and facet arthropathy is unchanged from 03/24/2023 no severe spinal canal narrowing. Upper chest: No acute abnormality. Other: Carotid calcification. IMPRESSION: 1. Comminuted nondisplaced fracture of the dens at the base of the odontoid process with fracture lines extending into the C2 vertebral body and C2 lateral masses (mixed type II/III). Fracture lines extend into the right transverse process. CTA neck is recommended to exclude vascular injury. 2. No acute intracranial abnormality. Critical Value/emergent results were called by telephone at the time of interpretation on 06/05/2023 at 10:28 pm to provider Dr. Consepcion, who verbally acknowledged these results. Electronically Signed   By: Norman Gatlin M.D.   On: 06/05/2023 22:32   CT Cervical Spine Wo Contrast Result Date: 06/05/2023 CLINICAL DATA:  Clemens out of bed neck pain. Laceration to left ear. Patient is on Eliquis . EXAM: CT HEAD WITHOUT CONTRAST CT CERVICAL SPINE WITHOUT CONTRAST TECHNIQUE: Multidetector CT imaging of the head and cervical spine was performed following the standard protocol without intravenous contrast. Multiplanar CT image reconstructions of the cervical spine were also generated. RADIATION DOSE REDUCTION: This exam was performed according to the departmental dose-optimization program which includes automated exposure control, adjustment of the mA and/or kV according to patient size and/or use of iterative reconstruction technique. COMPARISON:  CT head 03/24/2023 and CT cervical spine 03/24/2023 FINDINGS: CT HEAD FINDINGS Brain: No intracranial hemorrhage, mass effect, or evidence of acute infarct. No hydrocephalus. No extra-axial fluid collection. Age related cerebral atrophy and chronic  small vessel ischemic disease. Vascular: No hyperdense vessel. Intracranial arterial calcification. Skull: No fracture or focal lesion. Sinuses/Orbits: No acute finding. Other: None. CT CERVICAL SPINE FINDINGS Alignment: No evidence of traumatic malalignment. Skull base and vertebrae: Comminuted nondisplaced fracture of the dens at the base of the odontoid process with fracture lines extending into the C2 vertebral body and C2 lateral masses bilaterally (mixed type II/III). Fracture lines extend into the right transverse process. CTA neck is recommended to exclude vascular injury. Soft tissues and spinal canal: Mild prevertebral swelling at C2. No definite canal hematoma. Disc levels: Multilevel spondylosis, disc space height loss, and facet arthropathy is unchanged from 03/24/2023 no severe spinal canal narrowing. Upper chest: No acute abnormality. Other: Carotid calcification. IMPRESSION: 1. Comminuted nondisplaced fracture of the dens at the base of the odontoid process with fracture lines extending into the C2 vertebral body and C2 lateral masses (mixed type II/III). Fracture lines extend into the right transverse process. CTA neck is recommended to exclude vascular injury. 2. No acute intracranial abnormality. Critical Value/emergent results were called by telephone at the time of interpretation on 06/05/2023 at 10:28 pm to provider Dr. Consepcion, who verbally acknowledged these results. Electronically Signed   By: Norman Gatlin M.D.   On: 06/05/2023 22:32   DG Pelvis Portable Result Date: 06/05/2023 CLINICAL DATA:  Level 2 trauma, fall EXAM: PORTABLE PELVIS 1-2 VIEWS COMPARISON:  None Available. FINDINGS: Normal alignment. No acute fracture or dislocation. Moderate bilateral degenerative hip arthritis. Soft tissues are unremarkable. IMPRESSION: 1. Moderate bilateral degenerative hip arthritis. Electronically Signed   By: Dorethia Molt M.D.   On: 06/05/2023 22:10   DG Chest Portable 1 View Result Date:  06/05/2023 CLINICAL DATA:  Fall on blood thinner EXAM: PORTABLE CHEST 1 VIEW COMPARISON:  05/21/2023 FINDINGS: Left-sided pacing device as before. Scarring or atelectasis at the left base. No consolidation, pleural effusion or pneumothorax. Stable cardiomediastinal silhouette with aortic atherosclerosis. No pneumothorax IMPRESSION: No active disease. Scarring or atelectasis at  the left base. Electronically Signed   By: Luke Bun M.D.   On: 06/05/2023 22:09    Microbiology: Recent Results (from the past 240 hours)  Resp panel by RT-PCR (RSV, Flu A&B, Covid) Anterior Nasal Swab     Status: None   Collection Time: 06/20/23 11:31 AM   Specimen: Anterior Nasal Swab  Result Value Ref Range Status   SARS Coronavirus 2 by RT PCR NEGATIVE NEGATIVE Final    Comment: (NOTE) SARS-CoV-2 target nucleic acids are NOT DETECTED.  The SARS-CoV-2 RNA is generally detectable in upper respiratory specimens during the acute phase of infection. The lowest concentration of SARS-CoV-2 viral copies this assay can detect is 138 copies/mL. A negative result does not preclude SARS-Cov-2 infection and should not be used as the sole basis for treatment or other patient management decisions. A negative result may occur with  improper specimen collection/handling, submission of specimen other than nasopharyngeal swab, presence of viral mutation(s) within the areas targeted by this assay, and inadequate number of viral copies(<138 copies/mL). A negative result must be combined with clinical observations, patient history, and epidemiological information. The expected result is Negative.  Fact Sheet for Patients:  bloggercourse.com  Fact Sheet for Healthcare Providers:  seriousbroker.it  This test is no t yet approved or cleared by the [place]  FDA and  has been authorized for detection and/or diagnosis of SARS-CoV-2 by FDA under an Emergency Use Authorization  (EUA). This EUA will remain  in effect (meaning this test can be used) for the duration of the COVID-19 declaration under Section 564(b)(1) of the Act, 21 U.S.C.section 360bbb-3(b)(1), unless the authorization is terminated  or revoked sooner.       Influenza A by PCR NEGATIVE NEGATIVE Final   Influenza B by PCR NEGATIVE NEGATIVE Final    Comment: (NOTE) The Xpert Xpress SARS-CoV-2/FLU/RSV plus assay is intended as an aid in the diagnosis of influenza from Nasopharyngeal swab specimens and should not be used as a sole basis for treatment. Nasal washings and aspirates are unacceptable for Xpert Xpress SARS-CoV-2/FLU/RSV testing.  Fact Sheet for Patients: bloggercourse.com  Fact Sheet for Healthcare Providers: seriousbroker.it  This test is not yet approved or cleared by the [place]  FDA and has been authorized for detection and/or diagnosis of SARS-CoV-2 by FDA under an Emergency Use Authorization (EUA). This EUA will remain in effect (meaning this test can be used) for the duration of the COVID-19 declaration under Section 564(b)(1) of the Act, 21 U.S.C. section 360bbb-3(b)(1), unless the authorization is terminated or revoked.     Resp Syncytial Virus by PCR NEGATIVE NEGATIVE Final    Comment: (NOTE) Fact Sheet for Patients: bloggercourse.com  Fact Sheet for Healthcare Providers: seriousbroker.it  This test is not yet approved or cleared by the [place]  FDA and has been authorized for detection and/or diagnosis of SARS-CoV-2 by FDA under an Emergency Use Authorization (EUA). This EUA will remain in effect (meaning this test can be used) for the duration of the COVID-19 declaration under Section 564(b)(1) of the Act, 21 U.S.C. section 360bbb-3(b)(1), unless the authorization is terminated or revoked.  Performed at [facility], [address], [place], [place] [address]   Blood Culture (routine x 2)     Status: None (Preliminary result)   Collection Time: 06/20/23  1:33 PM   Specimen: BLOOD  Result Value Ref Range Status   Specimen Description   Final    BLOOD SITE NOT SPECIFIED Performed at [facility],  [address], [place], [place] [address]    Special Requests   Final    BOTTLES DRAWN AEROBIC AND ANAEROBIC Blood Culture results may not be optimal due to an inadequate volume of blood received in culture bottles Performed at [facility], [address], [place], [place] [address]    Culture   Final    NO GROWTH < 24 HOURS Performed at [facility], [address], [place], [place] [address]    Report Status PENDING  Incomplete  Blood Culture (routine x 2)     Status: None (Preliminary result)   Collection Time: 06/20/23  1:34 PM   Specimen: BLOOD  Result Value Ref Range Status   Specimen Description   Final    BLOOD SITE NOT SPECIFIED Performed at [facility], [address] [place], [place] [address]    Special Requests   Final    BOTTLES DRAWN AEROBIC AND ANAEROBIC Blood Culture results may not be optimal due to an inadequate volume of blood received in culture bottles Performed at [facility], [address] [place], [place] [address]    Culture   Final    NO GROWTH < 24 HOURS Performed at [facility], [address], [place], [place] [address]    Report Status PENDING  Incomplete     Signed: Lonni SHAUNNA Dalton, MD 05-Jul-2023

## 2023-07-13 DEATH — deceased

## 2023-07-15 ENCOUNTER — Ambulatory Visit: Payer: No Typology Code available for payment source | Admitting: Neurology
